# Patient Record
Sex: Female | Born: 1960 | Race: White | Hispanic: No | Smoking: Current every day smoker
Health system: Southern US, Community
[De-identification: ages and names within clinical notes are randomized; demographics above are authoritative.]

## PROBLEM LIST (undated history)

## (undated) DIAGNOSIS — J45901 Unspecified asthma with (acute) exacerbation: Secondary | ICD-10-CM

## (undated) DIAGNOSIS — M489 Spondylopathy, unspecified: Secondary | ICD-10-CM

## (undated) DIAGNOSIS — R7303 Prediabetes: Secondary | ICD-10-CM

## (undated) DIAGNOSIS — K7460 Unspecified cirrhosis of liver: Secondary | ICD-10-CM

## (undated) DIAGNOSIS — G8929 Other chronic pain: Secondary | ICD-10-CM

## (undated) DIAGNOSIS — E119 Type 2 diabetes mellitus without complications: Secondary | ICD-10-CM

## (undated) DIAGNOSIS — G4730 Sleep apnea, unspecified: Secondary | ICD-10-CM

## (undated) DIAGNOSIS — I639 Cerebral infarction, unspecified: Secondary | ICD-10-CM

## (undated) DIAGNOSIS — R011 Cardiac murmur, unspecified: Secondary | ICD-10-CM

## (undated) DIAGNOSIS — F410 Panic disorder [episodic paroxysmal anxiety] without agoraphobia: Secondary | ICD-10-CM

## (undated) DIAGNOSIS — Z87442 Personal history of urinary calculi: Secondary | ICD-10-CM

## (undated) DIAGNOSIS — Q2112 Patent foramen ovale: Secondary | ICD-10-CM

## (undated) DIAGNOSIS — D649 Anemia, unspecified: Secondary | ICD-10-CM

## (undated) DIAGNOSIS — J449 Chronic obstructive pulmonary disease, unspecified: Secondary | ICD-10-CM

## (undated) DIAGNOSIS — I499 Cardiac arrhythmia, unspecified: Secondary | ICD-10-CM

## (undated) DIAGNOSIS — H269 Unspecified cataract: Secondary | ICD-10-CM

## (undated) DIAGNOSIS — T7840XA Allergy, unspecified, initial encounter: Secondary | ICD-10-CM

## (undated) DIAGNOSIS — E559 Vitamin D deficiency, unspecified: Secondary | ICD-10-CM

## (undated) DIAGNOSIS — I10 Essential (primary) hypertension: Secondary | ICD-10-CM

## (undated) DIAGNOSIS — M1990 Unspecified osteoarthritis, unspecified site: Secondary | ICD-10-CM

## (undated) DIAGNOSIS — K219 Gastro-esophageal reflux disease without esophagitis: Secondary | ICD-10-CM

## (undated) DIAGNOSIS — Q211 Atrial septal defect: Secondary | ICD-10-CM

## (undated) DIAGNOSIS — Z5189 Encounter for other specified aftercare: Secondary | ICD-10-CM

## (undated) DIAGNOSIS — E7849 Other hyperlipidemia: Secondary | ICD-10-CM

## (undated) HISTORY — DX: Unspecified cirrhosis of liver: K74.60

## (undated) HISTORY — PX: CHOLECYSTECTOMY: SHX55

## (undated) HISTORY — PX: HERNIA REPAIR: SHX51

## (undated) HISTORY — DX: Unspecified asthma with (acute) exacerbation: J45.901

## (undated) HISTORY — DX: Other hyperlipidemia: E78.49

## (undated) HISTORY — DX: Encounter for other specified aftercare: Z51.89

## (undated) HISTORY — DX: Chronic obstructive pulmonary disease, unspecified: J44.9

## (undated) HISTORY — DX: Cardiac murmur, unspecified: R01.1

## (undated) HISTORY — DX: Other chronic pain: G89.29

## (undated) HISTORY — PX: SHOULDER ARTHROSCOPY: SHX128

## (undated) HISTORY — DX: Atrial septal defect: Q21.1

## (undated) HISTORY — DX: Vitamin D deficiency, unspecified: E55.9

## (undated) HISTORY — PX: TONSILLECTOMY AND ADENOIDECTOMY: SHX28

## (undated) HISTORY — DX: Morbid (severe) obesity due to excess calories: E66.01

## (undated) HISTORY — DX: Prediabetes: R73.03

## (undated) HISTORY — DX: Essential (primary) hypertension: I10

## (undated) HISTORY — DX: Unspecified cataract: H26.9

## (undated) HISTORY — DX: Allergy, unspecified, initial encounter: T78.40XA

## (undated) HISTORY — DX: Panic disorder (episodic paroxysmal anxiety): F41.0

## (undated) HISTORY — DX: Anemia, unspecified: D64.9

## (undated) HISTORY — DX: Spondylopathy, unspecified: M48.9

## (undated) HISTORY — PX: TUBAL LIGATION: SHX77

## (undated) HISTORY — DX: Unspecified osteoarthritis, unspecified site: M19.90

## (undated) HISTORY — DX: Patent foramen ovale: Q21.12

## (undated) NOTE — *Deleted (*Deleted)
Advice for Weight Management  -For most of us the best way to lose weight is by diet management. Generally speaking, diet management means consuming less calories intentionally which over time brings about progressive weight loss.  This can be achieved more effectively by restricting carbohydrate consumption to the minimum possible.  So, it is critically important to know your numbers: how much calorie you are consuming and how much calorie you need. More importantly, our carbohydrates sources should be unprocessed or minimally processed complex starch food items.   Sometimes, it is important to balance nutrition by increasing protein intake (animal or plant source), fruits, and vegetables.  -Sticking to a routine mealtime to eat 3 meals a day and avoiding unnecessary snacks is shown to have a big role in weight control. Under normal circumstances, the only time we lose real weight is when we are hungry, so allow hunger to take place- hunger means no food between meal times, only water.  It is not advisable to starve.   -It is better to avoid simple carbohydrates including: Cakes, Sweet Desserts, Ice Cream, Soda (diet and regular), Sweet Tea, Candies, Chips, Cookies, Store Bought Juices, Alcohol in Excess of  1-2 drinks a day, Artificial Sweeteners, Doughnuts, Coffee Creamers, "Sugar-free" Products, etc, etc.  This is not a complete list.....    -Consulting with certified diabetes educators is proven to provide you with the most accurate and current information on diet.  Also, you may be  interested in discussing diet options/exchanges , we can schedule a visit with Penny Crumpton, RDN, CDE for individualized nutrition education.  -Exercise: If you are able: 30 -60 minutes a day ,4 days a week, or 150 minutes a week.  The longer the better.  Combine stretch, strength, and aerobic activities.  If you were told in the past that you have high risk for cardiovascular diseases, you may seek evaluation by  your heart doctor prior to initiating moderate to intense exercise programs.    

---

## 2016-04-01 HISTORY — PX: TEE WITHOUT CARDIOVERSION: SHX5443

## 2016-04-16 DIAGNOSIS — E119 Type 2 diabetes mellitus without complications: Secondary | ICD-10-CM | POA: Diagnosis not present

## 2016-08-23 ENCOUNTER — Other Ambulatory Visit (HOSPITAL_COMMUNITY): Payer: Self-pay | Admitting: Internal Medicine

## 2016-08-23 DIAGNOSIS — Z1231 Encounter for screening mammogram for malignant neoplasm of breast: Secondary | ICD-10-CM

## 2016-08-23 DIAGNOSIS — E119 Type 2 diabetes mellitus without complications: Secondary | ICD-10-CM | POA: Diagnosis not present

## 2016-08-23 DIAGNOSIS — J45909 Unspecified asthma, uncomplicated: Secondary | ICD-10-CM | POA: Diagnosis not present

## 2016-08-23 DIAGNOSIS — F17210 Nicotine dependence, cigarettes, uncomplicated: Secondary | ICD-10-CM | POA: Diagnosis not present

## 2016-08-23 DIAGNOSIS — I10 Essential (primary) hypertension: Secondary | ICD-10-CM | POA: Diagnosis not present

## 2016-09-04 ENCOUNTER — Ambulatory Visit (HOSPITAL_COMMUNITY): Payer: Self-pay

## 2016-09-06 ENCOUNTER — Encounter (HOSPITAL_COMMUNITY): Payer: Self-pay | Admitting: Radiology

## 2016-09-06 ENCOUNTER — Ambulatory Visit (HOSPITAL_COMMUNITY)
Admission: RE | Admit: 2016-09-06 | Discharge: 2016-09-06 | Disposition: A | Discharge status: Home / Self Care | Payer: Medicare Other | Source: Ambulatory Visit | Attending: Internal Medicine | Admitting: Internal Medicine

## 2016-09-06 DIAGNOSIS — Z1231 Encounter for screening mammogram for malignant neoplasm of breast: Secondary | ICD-10-CM

## 2016-09-10 DIAGNOSIS — F410 Panic disorder [episodic paroxysmal anxiety] without agoraphobia: Secondary | ICD-10-CM | POA: Diagnosis not present

## 2016-09-10 DIAGNOSIS — E119 Type 2 diabetes mellitus without complications: Secondary | ICD-10-CM | POA: Diagnosis not present

## 2016-09-10 DIAGNOSIS — R010 Benign and innocent cardiac murmurs: Secondary | ICD-10-CM | POA: Diagnosis not present

## 2016-09-10 DIAGNOSIS — G8929 Other chronic pain: Secondary | ICD-10-CM | POA: Diagnosis not present

## 2016-09-11 DIAGNOSIS — E559 Vitamin D deficiency, unspecified: Secondary | ICD-10-CM | POA: Diagnosis not present

## 2016-09-11 DIAGNOSIS — I10 Essential (primary) hypertension: Secondary | ICD-10-CM | POA: Diagnosis not present

## 2016-09-11 DIAGNOSIS — E784 Other hyperlipidemia: Secondary | ICD-10-CM | POA: Diagnosis not present
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
## 2016-09-25 ENCOUNTER — Encounter: Payer: Self-pay | Admitting: *Deleted

## 2016-09-26 ENCOUNTER — Encounter: Payer: Self-pay | Admitting: Cardiovascular Disease

## 2016-09-26 ENCOUNTER — Ambulatory Visit (INDEPENDENT_AMBULATORY_CARE_PROVIDER_SITE_OTHER): Payer: Medicare Other | Admitting: Cardiovascular Disease

## 2016-09-26 VITALS — BP 144/90 | HR 86 | Ht 64.0 in | Wt 230.0 lb

## 2016-09-26 DIAGNOSIS — R011 Cardiac murmur, unspecified: Secondary | ICD-10-CM | POA: Diagnosis not present

## 2016-09-26 NOTE — Patient Instructions (Addendum)
Your physician recommends that you schedule a follow-up appointment As Needed  Your physician recommends that you continue on your current medications as directed. Please refer to the Current Medication list given to you today.  Please have ECHO Bubble Study  If you need a refill on your cardiac medications before your next appointment, please call your pharmacy.  Thank you for choosing [facility]!

## 2016-10-04 ENCOUNTER — Ambulatory Visit (HOSPITAL_COMMUNITY)
Admission: RE | Admit: 2016-10-04 | Discharge: 2016-10-04 | Disposition: A | Discharge status: Home / Self Care | Payer: Medicare Other | Source: Ambulatory Visit | Attending: Cardiovascular Disease | Admitting: Cardiovascular Disease

## 2016-10-04 DIAGNOSIS — I420 Dilated cardiomyopathy: Secondary | ICD-10-CM | POA: Insufficient documentation

## 2016-10-04 DIAGNOSIS — I080 Rheumatic disorders of both mitral and aortic valves: Secondary | ICD-10-CM | POA: Insufficient documentation

## 2016-10-04 DIAGNOSIS — R011 Cardiac murmur, unspecified: Secondary | ICD-10-CM

## 2016-10-04 DIAGNOSIS — Q211 Atrial septal defect: Secondary | ICD-10-CM | POA: Diagnosis not present

## 2016-10-04 LAB — ECHOCARDIOGRAM COMPLETE BUBBLE STUDY
CHL CUP DOP CALC LVOT VTI: 24.9 cm
CHL CUP MV DEC (S): 218
CHL CUP STROKE VOLUME: 49 mL
EERAT: 12.9
EWDT: 218 ms
FS: 35 % (ref 28–44)
IVS/LV PW RATIO, ED: 1.07
LA ID, A-P, ES: 33 mm
LA vol A4C: 33 ml
LADIAMINDEX: 1.48 cm/m2
LAVOL: 41 mL
LAVOLIN: 18.4 mL/m2
LEFT ATRIUM END SYS DIAM: 33 mm
LV E/e' medial: 12.9
LV SIMPSON'S DISK: 63
LV TDI E'MEDIAL: 9.36
LV dias vol index: 35 mL/m2
LV dias vol: 78 mL (ref 46–106)
LV sys vol index: 13 mL/m2
LV sys vol: 29 mL (ref 14–42)
LVEEAVG: 12.9
LVELAT: 6.96 cm/s
LVOT area: 2.27 cm2
LVOT diameter: 17 mm
LVOT peak grad rest: 4 mmHg
LVOTPV: 103 cm/s
LVOTSV: 57 mL
Lateral S' vel: 12.6 cm/s
MV pk A vel: 90.6 m/s
MVPG: 3 mmHg
MVPKEVEL: 89.8 m/s
PW: 9.31 mm — AB (ref 0.6–1.1)
RV TAPSE: 23.2 mm
TDI e' lateral: 6.96

## 2016-10-04 NOTE — Progress Notes (Signed)
*  PRELIMINARY RESULTS* Echocardiogram 2D Echocardiogram has been performed with Saline Bubbly Study.  Samuel Germany 10/04/2016, 10:44 AM

## 2016-11-01 DIAGNOSIS — G894 Chronic pain syndrome: Secondary | ICD-10-CM | POA: Diagnosis not present

## 2016-11-01 DIAGNOSIS — M25539 Pain in unspecified wrist: Secondary | ICD-10-CM | POA: Diagnosis not present

## 2016-11-01 DIAGNOSIS — M25519 Pain in unspecified shoulder: Secondary | ICD-10-CM | POA: Diagnosis not present

## 2016-11-01 DIAGNOSIS — M79659 Pain in unspecified thigh: Secondary | ICD-10-CM | POA: Diagnosis not present

## 2016-11-01 DIAGNOSIS — M545 Low back pain: Secondary | ICD-10-CM | POA: Diagnosis not present

## 2016-11-01 DIAGNOSIS — M25511 Pain in right shoulder: Secondary | ICD-10-CM | POA: Diagnosis not present

## 2016-11-01 DIAGNOSIS — M25569 Pain in unspecified knee: Secondary | ICD-10-CM | POA: Diagnosis not present

## 2016-11-01 DIAGNOSIS — M542 Cervicalgia: Secondary | ICD-10-CM | POA: Diagnosis not present

## 2016-11-29 DIAGNOSIS — M542 Cervicalgia: Secondary | ICD-10-CM | POA: Diagnosis not present

## 2016-11-29 DIAGNOSIS — G894 Chronic pain syndrome: Secondary | ICD-10-CM | POA: Diagnosis not present

## 2016-11-29 DIAGNOSIS — M545 Low back pain: Secondary | ICD-10-CM | POA: Diagnosis not present

## 2016-12-20 DIAGNOSIS — R011 Cardiac murmur, unspecified: Secondary | ICD-10-CM | POA: Diagnosis not present

## 2016-12-20 DIAGNOSIS — I10 Essential (primary) hypertension: Secondary | ICD-10-CM | POA: Diagnosis not present

## 2016-12-20 DIAGNOSIS — E119 Type 2 diabetes mellitus without complications: Secondary | ICD-10-CM | POA: Diagnosis not present

## 2017-01-14 DIAGNOSIS — M545 Low back pain: Secondary | ICD-10-CM | POA: Diagnosis not present

## 2017-01-14 DIAGNOSIS — G894 Chronic pain syndrome: Secondary | ICD-10-CM | POA: Diagnosis not present

## 2017-01-14 DIAGNOSIS — Z79891 Long term (current) use of opiate analgesic: Secondary | ICD-10-CM | POA: Diagnosis not present

## 2017-01-14 DIAGNOSIS — M542 Cervicalgia: Secondary | ICD-10-CM | POA: Diagnosis not present

## 2017-04-09 DIAGNOSIS — R010 Benign and innocent cardiac murmurs: Secondary | ICD-10-CM | POA: Diagnosis not present

## 2017-04-09 DIAGNOSIS — E119 Type 2 diabetes mellitus without complications: Secondary | ICD-10-CM | POA: Diagnosis not present

## 2017-04-09 DIAGNOSIS — F410 Panic disorder [episodic paroxysmal anxiety] without agoraphobia: Secondary | ICD-10-CM | POA: Diagnosis not present

## 2017-04-09 DIAGNOSIS — R7303 Prediabetes: Secondary | ICD-10-CM | POA: Diagnosis not present

## 2017-04-09 DIAGNOSIS — I10 Essential (primary) hypertension: Secondary | ICD-10-CM | POA: Diagnosis not present

## 2017-04-09 DIAGNOSIS — G8929 Other chronic pain: Secondary | ICD-10-CM | POA: Diagnosis not present

## 2017-06-02 ENCOUNTER — Emergency Department (HOSPITAL_COMMUNITY)
Admission: EM | Admit: 2017-06-02 | Discharge: 2017-06-02 | Disposition: A | Discharge status: Home / Self Care | Payer: Medicare Other | Attending: Emergency Medicine | Admitting: Emergency Medicine

## 2017-06-02 ENCOUNTER — Other Ambulatory Visit: Payer: Self-pay

## 2017-06-02 ENCOUNTER — Encounter (HOSPITAL_COMMUNITY): Payer: Self-pay | Admitting: Emergency Medicine

## 2017-06-02 ENCOUNTER — Emergency Department (HOSPITAL_COMMUNITY): Payer: Medicare Other

## 2017-06-02 DIAGNOSIS — R202 Paresthesia of skin: Secondary | ICD-10-CM | POA: Insufficient documentation

## 2017-06-02 DIAGNOSIS — I10 Essential (primary) hypertension: Secondary | ICD-10-CM | POA: Insufficient documentation

## 2017-06-02 DIAGNOSIS — R531 Weakness: Secondary | ICD-10-CM | POA: Insufficient documentation

## 2017-06-02 DIAGNOSIS — Z79899 Other long term (current) drug therapy: Secondary | ICD-10-CM | POA: Insufficient documentation

## 2017-06-02 DIAGNOSIS — F17210 Nicotine dependence, cigarettes, uncomplicated: Secondary | ICD-10-CM | POA: Diagnosis not present

## 2017-06-02 DIAGNOSIS — R200 Anesthesia of skin: Secondary | ICD-10-CM | POA: Diagnosis not present

## 2017-06-02 DIAGNOSIS — J45909 Unspecified asthma, uncomplicated: Secondary | ICD-10-CM | POA: Insufficient documentation

## 2017-06-02 LAB — CBC WITH DIFFERENTIAL/PLATELET
BASOS ABS: 0 10*3/uL (ref 0.0–0.1)
BASOS PCT: 0 %
EOS ABS: 0.3 10*3/uL (ref 0.0–0.7)
Eosinophils Relative: 4 %
HEMATOCRIT: 48.9 % — AB (ref 36.0–46.0)
HEMOGLOBIN: 15.6 g/dL — AB (ref 12.0–15.0)
Lymphocytes Relative: 31 %
Lymphs Abs: 2.2 10*3/uL (ref 0.7–4.0)
MCH: 29.1 pg (ref 26.0–34.0)
MCHC: 31.9 g/dL (ref 30.0–36.0)
MCV: 91.2 fL (ref 78.0–100.0)
Monocytes Absolute: 0.7 10*3/uL (ref 0.1–1.0)
Monocytes Relative: 9 %
NEUTROS ABS: 4 10*3/uL (ref 1.7–7.7)
NEUTROS PCT: 56 %
Platelets: 217 10*3/uL (ref 150–400)
RBC: 5.36 MIL/uL — ABNORMAL HIGH (ref 3.87–5.11)
RDW: 14.2 % (ref 11.5–15.5)
WBC: 7.1 10*3/uL (ref 4.0–10.5)

## 2017-06-02 LAB — COMPREHENSIVE METABOLIC PANEL
ALBUMIN: 3.9 g/dL (ref 3.5–5.0)
ALK PHOS: 83 U/L (ref 38–126)
ALT: 20 U/L (ref 14–54)
AST: 20 U/L (ref 15–41)
Anion gap: 11 (ref 5–15)
BILIRUBIN TOTAL: 0.4 mg/dL (ref 0.3–1.2)
BUN: 12 mg/dL (ref 6–20)
CALCIUM: 9.1 mg/dL (ref 8.9–10.3)
CO2: 23 mmol/L (ref 22–32)
Chloride: 103 mmol/L (ref 101–111)
Creatinine, Ser: 0.75 mg/dL (ref 0.44–1.00)
GFR calc Af Amer: >60 mL/min (ref >60)
GFR calc non Af Amer: >60 mL/min (ref >60)
GLUCOSE: 118 mg/dL — AB (ref 65–99)
POTASSIUM: 3.5 mmol/L (ref 3.5–5.1)
SODIUM: 137 mmol/L (ref 135–145)
TOTAL PROTEIN: 7 g/dL (ref 6.5–8.1)

## 2017-06-02 MED ORDER — CLOPIDOGREL BISULFATE 75 MG PO TABS: 75.0000 mg | ORAL_TABLET | Freq: Every day | ORAL | 0 refills | Status: DC

## 2017-06-02 MED ORDER — CLOPIDOGREL BISULFATE 75 MG PO TABS
75.0000 mg | ORAL_TABLET | Freq: Once | ORAL | Status: AC
  Administered 2017-06-02: 75 mg via ORAL
  Filled 2017-06-02: qty 1

## 2017-06-02 NOTE — ED Provider Notes (Signed)
[facility] EMERGENCY DEPARTMENT Provider Note   CSN: [id number] Arrival date & time: 06/02/17  1243     History   Chief Complaint Chief Complaint  Patient presents with  . Numbness    HPI Wendy Kramer is a 57 y.o. female.  Patient states for a week now she has been having periodic numbness in her right hand and right leg and in her right upper lip.  She states when she turns her head certain ways it helps it in all 3 areas.  Patient states this is been going on for at least 7 days   The history is provided by the patient. No language interpreter was used.  Weakness  Primary symptoms comment: Numbness. This is a new problem. The current episode started more than 1 week ago. The problem has not changed since onset.There was no focality noted. There has been no fever. Pertinent negatives include no chest pain and no headaches.    Past Medical History:  Diagnosis Date  . Cardiac murmur   . Exogenous hyperlipidemia   . HTN (hypertension)   . Morbid obesity due to excess calories ([place])   . Other chronic pain   . Panic disorder   . Prediabetes   . Unspecified asthma with (acute) exacerbation   . Vitamin D deficiency     There are no active problems to display for this patient.   Past Surgical History:  Procedure Laterality Date  . HERNIA REPAIR    . SHOULDER ARTHROSCOPY    . TONSILLECTOMY AND ADENOIDECTOMY      OB History    No data available       Home Medications    Prior to Admission medications   Medication Sig Start Date End Date Taking? Authorizing Provider  albuterol (PROVENTIL HFA;VENTOLIN HFA) 108 (90 Base) MCG/ACT inhaler Inhale 1-2 puffs into the lungs every 6 (six) hours as needed for wheezing or shortness of breath.   Yes [provider]  ALPRAZolam (XANAX) 0.25 MG tablet Take 0.125-0.25 mg by mouth daily as needed for anxiety.   Yes [provider]  budesonide-formoterol (SYMBICORT) 80-4.5 MCG/ACT inhaler Inhale 2 puffs into the  lungs 2 (two) times daily.   Yes [provider]  Cholecalciferol (VITAMIN D3) 1000 units CAPS Take 2,000 Units by mouth daily.   Yes [provider]  fluticasone (FLONASE) 50 MCG/ACT nasal spray Place 1-2 sprays into both nostrils daily as needed for allergies or rhinitis.   Yes [provider]  ibuprofen (ADVIL,MOTRIN) 200 MG tablet Take 800 mg by mouth every 6 (six) hours as needed for moderate pain.   Yes [provider]  lisinopril (PRINIVIL,ZESTRIL) 20 MG tablet Take 10 mg by mouth at bedtime.    Yes [provider]  ranitidine (ZANTAC) 150 MG tablet TK 1 T PO BID 09/20/16  Yes [provider]  traMADol (ULTRAM) 50 MG tablet Take 50 mg by mouth daily as needed for moderate pain.  09/20/16  Yes [provider]  atorvastatin (LIPITOR) 20 MG tablet TK 1 T PO QD 09/12/16   [provider]  clopidogrel (PLAVIX) 75 MG tablet Take 1 tablet (75 mg total) by mouth daily. 06/02/17   Milton Ferguson, MD    Family History Family History  Problem Relation Age of Onset  . Breast cancer Maternal Aunt     Social History Social History   Tobacco Use  . Smoking status: Current Every Day Smoker    Packs/day: 0.50    Types:  Cigarettes  . Smokeless tobacco: Never Used  Substance Use Topics  . Alcohol use: No  . Drug use: No     Allergies   Asa [aspirin]; Augmentin [amoxicillin-pot clavulanate]; Levaquin [levofloxacin in d5w]; and Lovenox [enoxaparin sodium]   Review of Systems Review of Systems  Constitutional: Negative for appetite change and fatigue.  HENT: Negative for congestion, ear discharge and sinus pressure.   Eyes: Negative for discharge.  Respiratory: Negative for cough.   Cardiovascular: Negative for chest pain.  Gastrointestinal: Negative for abdominal pain and diarrhea.  Genitourinary: Negative for frequency and hematuria.  Musculoskeletal: Negative for back pain.  Skin: Negative for rash.  Neurological:  Negative for seizures and headaches.       Numbness in face right hand and right foot  Psychiatric/Behavioral: Negative for hallucinations.     Physical Exam Updated Vital Signs BP 136/64 (BP Location: Left Arm)   Pulse 62   Temp (!) 97.4 F (36.3 C) (Oral)   Resp 18   Ht 5\' 4"  (1.626 m)   Wt 103.4 kg (228 lb)   LMP 09/06/2016   SpO2 96%   BMI 39.14 kg/m   Physical Exam  Constitutional: She is oriented to person, place, and time. She appears well-developed.  HENT:  Head: Normocephalic.  Eyes: Conjunctivae and EOM are normal. No scleral icterus.  Neck: Neck supple. No thyromegaly present.  Cardiovascular: Normal rate and regular rhythm. Exam reveals no gallop and no friction rub.  No murmur heard. Pulmonary/Chest: No stridor. She has no wheezes. She has no rales. She exhibits no tenderness.  Abdominal: She exhibits no distension. There is no tenderness. There is no rebound.  Musculoskeletal: Normal range of motion. She exhibits no edema.  Mild decreased sensation in right hand and right leg  Lymphadenopathy:    She has no cervical adenopathy.  Neurological: She is oriented to person, place, and time. She exhibits normal muscle tone. Coordination normal.  Skin: No rash noted. No erythema.  Psychiatric: She has a normal mood and affect. Her behavior is normal.     ED Treatments / Results  Labs (all labs ordered are listed, but only abnormal results are displayed) Labs Reviewed  CBC WITH DIFFERENTIAL/PLATELET - Abnormal; Notable for the following components:      Result Value   RBC 5.36 (*)    Hemoglobin 15.6 (*)    HCT 48.9 (*)    All other components within normal limits  COMPREHENSIVE METABOLIC PANEL - Abnormal; Notable for the following components:   Glucose, Bld 118 (*)    All other components within normal limits    EKG  EKG Interpretation None       Radiology Ct Head Wo Contrast  Result Date: 06/02/2017 CLINICAL DATA:  Right-sided limp, arm and leg  numbness times 10 days. Neck injury from fall 10 years ago. EXAM: CT HEAD WITHOUT CONTRAST CT CERVICAL SPINE WITHOUT CONTRAST TECHNIQUE: Multidetector CT imaging of the head and cervical spine was performed following the standard protocol without intravenous contrast. Multiplanar CT image reconstructions of the cervical spine were also generated. COMPARISON:  None. FINDINGS: CT HEAD FINDINGS Brain: Hypodensities in the left basal ganglia compatible with chronic lacunar infarcts. Chronic minimal small vessel ischemic disease of periventricular white matter. Large vascular territory infarct, hemorrhage or midline shift. No intra-axial mass nor extra-axial fluid collections. No hydrocephalus. Midline ventricle and basal cisterns. Vascular: No hyperdense vessels. Moderate atherosclerosis of the carotid siphons. Skull: No acute abnormality. Sinuses/Orbits: No acute abnormality. Other: None. CT CERVICAL  SPINE FINDINGS Alignment: Reversal cervical lordosis possibly from muscle spasm or positioning. Skull base and vertebrae: Intact skull base. Osteoarthritis of the atlantodental interval with joint space narrowing, sclerosis and spurring. No vertebral body fracture. No jumped or perched facets. Soft tissues and spinal canal: No prevertebral fluid or swelling. No visible canal hematoma. Disc levels: Small posterior marginal osteophytes at C5-6 and C6-7 with small central partially calcified disc suggested at C6-7. Uncovertebral joint osteoarthritis on the right at C6-7 with uncinate spurring. Upper chest: Negative. Other: Moderate extracranial carotid arteriosclerosis. IMPRESSION: 1. 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 No acute intracranial abnormality. 2. Mild degenerative change of lower cervical spine at C5-6 and C6-7. No acute osseous abnormality Electronically Signed   By: Ashley Royalty M.D.   On: 06/02/2017 18:10   Ct Cervical  Spine Wo Contrast  Result Date: 06/02/2017 CLINICAL DATA:  Right-sided limp, arm and leg numbness times 10 days. Neck injury from fall 10 years ago. EXAM: CT HEAD WITHOUT CONTRAST CT CERVICAL SPINE WITHOUT CONTRAST TECHNIQUE: Multidetector CT imaging of the head and cervical spine was performed following the standard protocol without intravenous contrast. Multiplanar CT image reconstructions of the cervical spine were also generated. COMPARISON:  None. FINDINGS: CT HEAD FINDINGS Brain: Hypodensities in the left basal ganglia compatible with chronic lacunar infarcts. Chronic minimal small vessel ischemic disease of periventricular white matter. Large vascular territory infarct, hemorrhage or midline shift. No intra-axial mass nor extra-axial fluid collections. No hydrocephalus. Midline ventricle and basal cisterns. Vascular: No hyperdense vessels. Moderate atherosclerosis of the carotid siphons. Skull: No acute abnormality. Sinuses/Orbits: No acute abnormality. Other: None. CT CERVICAL SPINE FINDINGS Alignment: Reversal cervical lordosis possibly from muscle spasm or positioning. Skull base and vertebrae: Intact skull base. Osteoarthritis of the atlantodental interval with joint space narrowing, sclerosis and spurring. No vertebral body fracture. No jumped or perched facets. Soft tissues and spinal canal: No prevertebral fluid or swelling. No visible canal hematoma. Disc levels: Small posterior marginal osteophytes at C5-6 and C6-7 with small central partially calcified disc suggested at C6-7. Uncovertebral joint osteoarthritis on the right at C6-7 with uncinate spurring. Upper chest: Negative. Other: Moderate extracranial carotid arteriosclerosis. IMPRESSION: 1. 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 No acute intracranial abnormality. 2. Mild degenerative change of lower cervical spine at C5-6 and C6-7. No acute osseous  abnormality Electronically Signed   By: Ashley Royalty M.D.   On: 06/02/2017 18:10    Procedures Procedures (including critical care time)  Medications Ordered in ED Medications  clopidogrel (PLAVIX) tablet 75 mg (not administered)     Initial Impression / Assessment and Plan / ED Course  I have reviewed the triage vital signs and the nursing notes.  Pertinent labs & imaging results that were available during my care of the patient were reviewed by me and considered in my medical decision making (see chart for details). Patient CT of head that shows old lacunar infarcts.  Patient has been on Plavix before but this was stopped for an unknown reason.  She is allergic to aspirin.  Suspect a lot of her symptoms are related to her cervical disease.  She has an appointment to see neurology in 4 days.  We will place her back on her Plavix unless neurologist decide whether treatment is necessary      Final Clinical Impressions(s) / ED Diagnoses   Final diagnoses:  Paresthesia    ED  Discharge Orders        Ordered    clopidogrel (PLAVIX) 75 MG tablet  Daily     06/02/17 1950       Milton Ferguson, MD 06/02/17 1955

## 2017-06-02 NOTE — Discharge Instructions (Signed)
When you see the neurologist on Friday talk to him about the numbness in your hand leg and face and the beginning of Plavix recently.
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
## 2017-06-06 DIAGNOSIS — M542 Cervicalgia: Secondary | ICD-10-CM | POA: Diagnosis not present

## 2017-06-06 DIAGNOSIS — M79601 Pain in right arm: Secondary | ICD-10-CM | POA: Diagnosis not present

## 2017-06-06 DIAGNOSIS — M545 Low back pain: Secondary | ICD-10-CM | POA: Diagnosis not present

## 2017-06-06 DIAGNOSIS — I679 Cerebrovascular disease, unspecified: Secondary | ICD-10-CM | POA: Diagnosis not present

## 2017-06-06 DIAGNOSIS — Z79891 Long term (current) use of opiate analgesic: Secondary | ICD-10-CM | POA: Diagnosis not present

## 2017-06-06 DIAGNOSIS — I6990 Unspecified sequelae of unspecified cerebrovascular disease: Secondary | ICD-10-CM | POA: Diagnosis not present

## 2017-06-06 DIAGNOSIS — I10 Essential (primary) hypertension: Secondary | ICD-10-CM | POA: Diagnosis not present

## 2017-07-11 DIAGNOSIS — I10 Essential (primary) hypertension: Secondary | ICD-10-CM | POA: Diagnosis not present

## 2017-07-11 DIAGNOSIS — I6990 Unspecified sequelae of unspecified cerebrovascular disease: Secondary | ICD-10-CM | POA: Diagnosis not present

## 2017-07-11 DIAGNOSIS — M542 Cervicalgia: Secondary | ICD-10-CM | POA: Diagnosis not present

## 2017-07-11 DIAGNOSIS — G894 Chronic pain syndrome: Secondary | ICD-10-CM | POA: Diagnosis not present

## 2017-07-11 DIAGNOSIS — Z79899 Other long term (current) drug therapy: Secondary | ICD-10-CM | POA: Diagnosis not present

## 2017-07-11 DIAGNOSIS — M79601 Pain in right arm: Secondary | ICD-10-CM | POA: Diagnosis not present

## 2017-07-11 DIAGNOSIS — I679 Cerebrovascular disease, unspecified: Secondary | ICD-10-CM | POA: Diagnosis not present

## 2017-07-11 DIAGNOSIS — Z79891 Long term (current) use of opiate analgesic: Secondary | ICD-10-CM | POA: Diagnosis not present

## 2017-07-14 DIAGNOSIS — Z1331 Encounter for screening for depression: Secondary | ICD-10-CM | POA: Diagnosis not present

## 2017-07-14 DIAGNOSIS — Z1389 Encounter for screening for other disorder: Secondary | ICD-10-CM | POA: Diagnosis not present

## 2017-07-14 DIAGNOSIS — E119 Type 2 diabetes mellitus without complications: Secondary | ICD-10-CM | POA: Diagnosis not present

## 2017-07-14 DIAGNOSIS — F17210 Nicotine dependence, cigarettes, uncomplicated: Secondary | ICD-10-CM | POA: Diagnosis not present

## 2017-07-14 DIAGNOSIS — F17200 Nicotine dependence, unspecified, uncomplicated: Secondary | ICD-10-CM | POA: Diagnosis not present

## 2017-07-14 DIAGNOSIS — I10 Essential (primary) hypertension: Secondary | ICD-10-CM | POA: Diagnosis not present

## 2017-07-14 DIAGNOSIS — J45909 Unspecified asthma, uncomplicated: Secondary | ICD-10-CM | POA: Diagnosis not present

## 2017-08-18 DIAGNOSIS — I10 Essential (primary) hypertension: Secondary | ICD-10-CM | POA: Diagnosis not present

## 2017-08-18 DIAGNOSIS — I679 Cerebrovascular disease, unspecified: Secondary | ICD-10-CM | POA: Diagnosis not present

## 2017-08-18 DIAGNOSIS — I6990 Unspecified sequelae of unspecified cerebrovascular disease: Secondary | ICD-10-CM | POA: Diagnosis not present

## 2017-08-18 DIAGNOSIS — M79601 Pain in right arm: Secondary | ICD-10-CM | POA: Diagnosis not present

## 2017-09-05 DIAGNOSIS — Z1211 Encounter for screening for malignant neoplasm of colon: Secondary | ICD-10-CM | POA: Diagnosis not present

## 2017-09-05 DIAGNOSIS — Z1212 Encounter for screening for malignant neoplasm of rectum: Secondary | ICD-10-CM | POA: Diagnosis not present

## 2017-09-05 LAB — COLOGUARD

## 2017-09-23 ENCOUNTER — Encounter: Payer: Self-pay | Admitting: Internal Medicine

## 2017-10-21 ENCOUNTER — Emergency Department (HOSPITAL_COMMUNITY)
Admission: EM | Admit: 2017-10-21 | Discharge: 2017-10-21 | Disposition: A | Discharge status: Home / Self Care | Payer: Medicare Other | Attending: Emergency Medicine | Admitting: Emergency Medicine

## 2017-10-21 ENCOUNTER — Other Ambulatory Visit: Payer: Self-pay

## 2017-10-21 ENCOUNTER — Encounter (HOSPITAL_COMMUNITY): Payer: Self-pay | Admitting: Emergency Medicine

## 2017-10-21 ENCOUNTER — Emergency Department (HOSPITAL_COMMUNITY): Payer: Medicare Other

## 2017-10-21 DIAGNOSIS — F17210 Nicotine dependence, cigarettes, uncomplicated: Secondary | ICD-10-CM | POA: Insufficient documentation

## 2017-10-21 DIAGNOSIS — Y999 Unspecified external cause status: Secondary | ICD-10-CM | POA: Insufficient documentation

## 2017-10-21 DIAGNOSIS — S46911A Strain of unspecified muscle, fascia and tendon at shoulder and upper arm level, right arm, initial encounter: Secondary | ICD-10-CM | POA: Diagnosis not present

## 2017-10-21 DIAGNOSIS — I10 Essential (primary) hypertension: Secondary | ICD-10-CM | POA: Insufficient documentation

## 2017-10-21 DIAGNOSIS — Z79899 Other long term (current) drug therapy: Secondary | ICD-10-CM | POA: Insufficient documentation

## 2017-10-21 DIAGNOSIS — Y939 Activity, unspecified: Secondary | ICD-10-CM | POA: Diagnosis not present

## 2017-10-21 DIAGNOSIS — J45909 Unspecified asthma, uncomplicated: Secondary | ICD-10-CM | POA: Diagnosis not present

## 2017-10-21 DIAGNOSIS — S4991XA Unspecified injury of right shoulder and upper arm, initial encounter: Secondary | ICD-10-CM | POA: Diagnosis present

## 2017-10-21 DIAGNOSIS — X58XXXA Exposure to other specified factors, initial encounter: Secondary | ICD-10-CM | POA: Diagnosis not present

## 2017-10-21 DIAGNOSIS — M25511 Pain in right shoulder: Secondary | ICD-10-CM | POA: Diagnosis not present

## 2017-10-21 DIAGNOSIS — Y929 Unspecified place or not applicable: Secondary | ICD-10-CM | POA: Insufficient documentation

## 2017-10-21 DIAGNOSIS — R109 Unspecified abdominal pain: Secondary | ICD-10-CM | POA: Insufficient documentation

## 2017-10-21 LAB — CBC WITH DIFFERENTIAL/PLATELET
BASOS PCT: 0 %
Basophils Absolute: 0 10*3/uL (ref 0.0–0.1)
EOS PCT: 6 %
Eosinophils Absolute: 0.8 10*3/uL — ABNORMAL HIGH (ref 0.0–0.7)
HCT: 47 % — ABNORMAL HIGH (ref 36.0–46.0)
Hemoglobin: 15.7 g/dL — ABNORMAL HIGH (ref 12.0–15.0)
LYMPHS ABS: 3 10*3/uL (ref 0.7–4.0)
Lymphocytes Relative: 24 %
MCH: 30.5 pg (ref 26.0–34.0)
MCHC: 33.4 g/dL (ref 30.0–36.0)
MCV: 91.4 fL (ref 78.0–100.0)
MONO ABS: 1 10*3/uL (ref 0.1–1.0)
MONOS PCT: 8 %
NEUTROS PCT: 62 %
Neutro Abs: 7.8 10*3/uL — ABNORMAL HIGH (ref 1.7–7.7)
PLATELETS: 222 10*3/uL (ref 150–400)
RBC: 5.14 MIL/uL — ABNORMAL HIGH (ref 3.87–5.11)
RDW: 13.9 % (ref 11.5–15.5)
WBC: 12.6 10*3/uL — ABNORMAL HIGH (ref 4.0–10.5)

## 2017-10-21 LAB — URINALYSIS, ROUTINE W REFLEX MICROSCOPIC
BILIRUBIN URINE: NEGATIVE
Glucose, UA: NEGATIVE mg/dL
KETONES UR: NEGATIVE mg/dL
LEUKOCYTES UA: NEGATIVE
NITRITE: NEGATIVE
PH: 5 (ref 5.0–8.0)
Protein, ur: NEGATIVE mg/dL
Specific Gravity, Urine: 1.019 (ref 1.005–1.030)

## 2017-10-21 LAB — BASIC METABOLIC PANEL
Anion gap: 8 (ref 5–15)
BUN: 13 mg/dL (ref 6–20)
CALCIUM: 9 mg/dL (ref 8.9–10.3)
CHLORIDE: 104 mmol/L (ref 98–111)
CO2: 25 mmol/L (ref 22–32)
CREATININE: 0.91 mg/dL (ref 0.44–1.00)
GFR calc non Af Amer: >60 mL/min (ref >60)
Glucose, Bld: 150 mg/dL — ABNORMAL HIGH (ref 70–99)
Potassium: 4 mmol/L (ref 3.5–5.1)
Sodium: 137 mmol/L (ref 135–145)

## 2017-10-21 LAB — MAGNESIUM: MAGNESIUM: 2.1 mg/dL (ref 1.7–2.4)

## 2017-10-21 MED ORDER — CELECOXIB 200 MG PO CAPS: 200.0000 mg | ORAL_CAPSULE | Freq: Two times a day (BID) | ORAL | 0 refills | Status: DC

## 2017-10-21 MED ORDER — KETOROLAC TROMETHAMINE 30 MG/ML IJ SOLN
30.0000 mg | Freq: Once | INTRAMUSCULAR | Status: AC
  Administered 2017-10-21: 30 mg via INTRAMUSCULAR
  Filled 2017-10-21: qty 1

## 2017-10-21 NOTE — ED Notes (Signed)
Patient transported to X-ray 

## 2017-10-21 NOTE — ED Provider Notes (Signed)
[facility] EMERGENCY DEPARTMENT Provider Note   CSN: [id number] Arrival date & time: 10/21/17  2039     History   Chief Complaint Chief Complaint  Patient presents with  . Flank Pain    HPI Wendy Kramer is a 57 y.o. female.  Pt presents to the ED today with right shoulder pain and right flank pain.  The pt has had right shoulder pain for the last 5 days.  She has a hx of rotator cuff injury and repair.  This feels similar to initial injury.  No known trauma.  She then developed some muscle spasm in the back of her arm.  Next, she developed right flank pain for the past 2 days.  She denies urinary sx.  No rash.       Past Medical History:  Diagnosis Date  . Cardiac murmur   . Exogenous hyperlipidemia   . HTN (hypertension)   . Morbid obesity due to excess calories ([place])   . Other chronic pain   . Panic disorder   . Prediabetes   . Unspecified asthma with (acute) exacerbation   . Vitamin D deficiency     There are no active problems to display for this patient.   Past Surgical History:  Procedure Laterality Date  . CHOLECYSTECTOMY    . HERNIA REPAIR    . SHOULDER ARTHROSCOPY    . TONSILLECTOMY AND ADENOIDECTOMY       OB History   None      Home Medications    Prior to Admission medications   Medication Sig Start Date End Date Taking? Authorizing Provider  albuterol (PROVENTIL HFA;VENTOLIN HFA) 108 (90 Base) MCG/ACT inhaler Inhale 1-2 puffs into the lungs every 6 (six) hours as needed for wheezing or shortness of breath.   Yes [provider]  ALPRAZolam (XANAX) 0.25 MG tablet Take 0.125-0.25 mg by mouth daily as needed for anxiety.   Yes [provider]  budesonide-formoterol (SYMBICORT) 80-4.5 MCG/ACT inhaler Inhale 1 puff into the lungs 2 (two) times daily.    Yes [provider]  Cholecalciferol (VITAMIN D3) 1000 units CAPS Take 2,000 Units by mouth daily.   Yes [provider]  clopidogrel (PLAVIX) 75 MG tablet Take  1 tablet (75 mg total) by mouth daily. 06/02/17  Yes Milton Ferguson, MD  cyclobenzaprine (FLEXERIL) 5 MG tablet Take 5-10 mg by mouth 3 (three) times daily as needed. 08/18/17  Yes [provider]  fluticasone (FLONASE) 50 MCG/ACT nasal spray Place 1-2 sprays into both nostrils daily as needed for allergies or rhinitis.   Yes [provider]  lisinopril (PRINIVIL,ZESTRIL) 10 MG tablet Take 10 mg by mouth at bedtime.    Yes [provider]  ranitidine (ZANTAC) 150 MG tablet TAKE ONE TABLET BY MOUTH TWICE DAILY 09/20/16  Yes [provider]  traMADol (ULTRAM) 50 MG tablet Take 50 mg by mouth daily as needed for moderate pain.  09/20/16  Yes [provider]  celecoxib (CELEBREX) 200 MG capsule Take 1 capsule (200 mg total) by mouth 2 (two) times daily. 10/21/17   Isla Pence, MD    Family History Family History  Problem Relation Age of Onset  . Breast cancer Maternal Aunt     Social History Social History   Tobacco Use  . Smoking status: Current Every Day Smoker    Packs/day: 0.50    Types: Cigarettes  . Smokeless tobacco: Never Used  Substance Use Topics  . Alcohol use: No  . Drug use:  No     Allergies   Asa [aspirin]; Augmentin [amoxicillin-pot clavulanate]; Levaquin [levofloxacin in d5w]; and Lovenox [enoxaparin sodium]   Review of Systems Review of Systems  Gastrointestinal: Positive for abdominal pain.  Musculoskeletal:       Right shoulder pain  All other systems reviewed and are negative.    Physical Exam Updated Vital Signs BP (!) 154/64 (BP Location: Left Arm)   Pulse 94   Temp 98 F (36.7 C) (Oral)   Resp 16   Ht 5\' 4"  (1.626 m)   Wt 104.8 kg (231 lb)   LMP 09/06/2016   SpO2 97%   BMI 39.65 kg/m   Physical Exam  Constitutional: She is oriented to person, place, and time. She appears well-developed and well-nourished.  HENT:  Head: Normocephalic and atraumatic.  Right Ear: External ear normal.  Left Ear:  External ear normal.  Nose: Nose normal.  Mouth/Throat: Oropharynx is clear and moist.  Eyes: Pupils are equal, round, and reactive to light. Conjunctivae and EOM are normal.  Neck: Normal range of motion. Neck supple.  Cardiovascular: Normal rate, regular rhythm, normal heart sounds and intact distal pulses.  Pulmonary/Chest: Effort normal and breath sounds normal.  Abdominal: Soft. Bowel sounds are normal.  Musculoskeletal:       Right shoulder: She exhibits decreased range of motion and tenderness.  Neurological: She is alert and oriented to person, place, and time.  Skin: Skin is warm. Capillary refill takes less than 2 seconds. No rash noted.  Psychiatric: She has a normal mood and affect. Her behavior is normal. Judgment and thought content normal.  Nursing note and vitals reviewed.    ED Treatments / Results  Labs (all labs ordered are listed, but only abnormal results are displayed) Labs Reviewed  URINALYSIS, ROUTINE W REFLEX MICROSCOPIC - Abnormal; Notable for the following components:      Result Value   APPearance HAZY (*)    Hgb urine dipstick SMALL (*)    Bacteria, UA RARE (*)    All other components within normal limits  BASIC METABOLIC PANEL - Abnormal; Notable for the following components:   Glucose, Bld 150 (*)    All other components within normal limits  CBC WITH DIFFERENTIAL/PLATELET - Abnormal; Notable for the following components:   WBC 12.6 (*)    RBC 5.14 (*)    Hemoglobin 15.7 (*)    HCT 47.0 (*)    Neutro Abs 7.8 (*)    Eosinophils Absolute 0.8 (*)    All other components within normal limits  MAGNESIUM    EKG None  Radiology Dg Shoulder Right  Result Date: 10/21/2017 CLINICAL DATA:  Right shoulder pain. No known injury. History of rotator cuff surgery 8 years ago. EXAM: RIGHT SHOULDER - 2+ VIEW COMPARISON:  None. FINDINGS: Postoperative changes in the right shoulder with surgical anchors over the lateral humeral head. No evidence of acute  fracture or dislocation. No focal bone lesion or bone destruction. Bone cortex appears intact. Acromioclavicular and coracoclavicular spaces are maintained. Soft tissues are unremarkable. IMPRESSION: Postoperative changes in the right shoulder. No acute bony abnormalities. Electronically Signed   By: Lucienne Capers M.D.   On: 10/21/2017 21:53    Procedures Procedures (including critical care time)  Medications Ordered in ED Medications  ketorolac (TORADOL) 30 MG/ML injection 30 mg (30 mg Intramuscular Given 10/21/17 2144)     Initial Impression / Assessment and Plan / ED Course  I have reviewed the triage vital signs and the nursing  notes.  Pertinent labs & imaging results that were available during my care of the patient were reviewed by me and considered in my medical decision making (see chart for details).    Flank pain of unknown etiology.  Her shoulder and back feel better after toradol.  Pt is instructed to f/u with pcp and with ortho.  Return if worse.  Final Clinical Impressions(s) / ED Diagnoses   Final diagnoses:  Shoulder strain, right, initial encounter  Right flank pain    ED Discharge Orders        Ordered    celecoxib (CELEBREX) 200 MG capsule  2 times daily     10/21/17 2235       Isla Pence, MD 10/21/17 2237

## 2017-10-21 NOTE — ED Triage Notes (Signed)
Pt c/o right flank pain x 2 days and right shoulder pain x 5 days.

## 2017-10-29 DIAGNOSIS — I10 Essential (primary) hypertension: Secondary | ICD-10-CM | POA: Diagnosis not present

## 2017-10-29 DIAGNOSIS — E119 Type 2 diabetes mellitus without complications: Secondary | ICD-10-CM | POA: Diagnosis not present

## 2017-10-29 DIAGNOSIS — M25511 Pain in right shoulder: Secondary | ICD-10-CM | POA: Diagnosis not present

## 2017-10-29 DIAGNOSIS — R109 Unspecified abdominal pain: Secondary | ICD-10-CM | POA: Diagnosis not present

## 2017-10-30 ENCOUNTER — Other Ambulatory Visit (HOSPITAL_COMMUNITY): Payer: Self-pay | Admitting: Internal Medicine

## 2017-10-30 DIAGNOSIS — R109 Unspecified abdominal pain: Secondary | ICD-10-CM

## 2017-11-06 ENCOUNTER — Ambulatory Visit (HOSPITAL_COMMUNITY)
Admission: RE | Admit: 2017-11-06 | Discharge: 2017-11-06 | Disposition: A | Discharge status: Home / Self Care | Payer: Medicare Other | Source: Ambulatory Visit | Attending: Internal Medicine | Admitting: Internal Medicine

## 2017-11-06 DIAGNOSIS — R109 Unspecified abdominal pain: Secondary | ICD-10-CM | POA: Diagnosis not present

## 2017-11-06 DIAGNOSIS — K7689 Other specified diseases of liver: Secondary | ICD-10-CM | POA: Diagnosis not present

## 2017-11-13 DIAGNOSIS — M79601 Pain in right arm: Secondary | ICD-10-CM | POA: Diagnosis not present

## 2017-11-13 DIAGNOSIS — M542 Cervicalgia: Secondary | ICD-10-CM | POA: Diagnosis not present

## 2017-11-13 DIAGNOSIS — I679 Cerebrovascular disease, unspecified: Secondary | ICD-10-CM | POA: Diagnosis not present

## 2017-11-13 DIAGNOSIS — I10 Essential (primary) hypertension: Secondary | ICD-10-CM | POA: Diagnosis not present

## 2017-11-18 ENCOUNTER — Other Ambulatory Visit (HOSPITAL_COMMUNITY): Payer: Self-pay | Admitting: Neurology

## 2017-11-18 DIAGNOSIS — M5412 Radiculopathy, cervical region: Secondary | ICD-10-CM

## 2017-11-27 ENCOUNTER — Telehealth: Payer: Self-pay | Admitting: Orthopaedic Surgery

## 2017-11-27 NOTE — Telephone Encounter (Signed)
I have tried to call this patient multiple times to schedule her for cervical spine pain.  She has not returned my call therefore I will just wait for her to call us back..  Dr. Luna Glasgow did give approval for this patient to be seen by him

## 2017-12-04 ENCOUNTER — Ambulatory Visit (HOSPITAL_COMMUNITY): Payer: Medicare Other

## 2017-12-11 ENCOUNTER — Ambulatory Visit (HOSPITAL_COMMUNITY): Payer: Medicare Other

## 2017-12-17 ENCOUNTER — Ambulatory Visit (HOSPITAL_COMMUNITY)
Admission: RE | Admit: 2017-12-17 | Discharge: 2017-12-17 | Disposition: A | Discharge status: Home / Self Care | Payer: Medicare Other | Source: Ambulatory Visit | Attending: Neurology | Admitting: Neurology

## 2017-12-17 DIAGNOSIS — M50123 Cervical disc disorder at C6-C7 level with radiculopathy: Secondary | ICD-10-CM | POA: Diagnosis not present

## 2017-12-17 DIAGNOSIS — M4802 Spinal stenosis, cervical region: Secondary | ICD-10-CM | POA: Insufficient documentation

## 2017-12-17 DIAGNOSIS — M5412 Radiculopathy, cervical region: Secondary | ICD-10-CM | POA: Diagnosis present

## 2017-12-17 DIAGNOSIS — M542 Cervicalgia: Secondary | ICD-10-CM | POA: Diagnosis not present

## 2017-12-24 ENCOUNTER — Encounter: Payer: Self-pay | Admitting: *Deleted

## 2017-12-24 ENCOUNTER — Other Ambulatory Visit: Payer: Self-pay | Admitting: *Deleted

## 2017-12-24 ENCOUNTER — Telehealth: Payer: Self-pay | Admitting: *Deleted

## 2017-12-24 ENCOUNTER — Ambulatory Visit (INDEPENDENT_AMBULATORY_CARE_PROVIDER_SITE_OTHER): Payer: Medicare Other | Admitting: Gastroenterology

## 2017-12-24 ENCOUNTER — Encounter: Payer: Self-pay | Admitting: Gastroenterology

## 2017-12-24 DIAGNOSIS — I679 Cerebrovascular disease, unspecified: Secondary | ICD-10-CM | POA: Diagnosis not present

## 2017-12-24 DIAGNOSIS — R1013 Epigastric pain: Secondary | ICD-10-CM

## 2017-12-24 DIAGNOSIS — K760 Fatty (change of) liver, not elsewhere classified: Secondary | ICD-10-CM | POA: Diagnosis not present

## 2017-12-24 DIAGNOSIS — I6990 Unspecified sequelae of unspecified cerebrovascular disease: Secondary | ICD-10-CM | POA: Diagnosis not present

## 2017-12-24 DIAGNOSIS — K219 Gastro-esophageal reflux disease without esophagitis: Secondary | ICD-10-CM | POA: Diagnosis not present

## 2017-12-24 DIAGNOSIS — M79601 Pain in right arm: Secondary | ICD-10-CM | POA: Diagnosis not present

## 2017-12-24 DIAGNOSIS — I10 Essential (primary) hypertension: Secondary | ICD-10-CM | POA: Diagnosis not present

## 2017-12-24 DIAGNOSIS — R195 Other fecal abnormalities: Secondary | ICD-10-CM

## 2017-12-24 MED ORDER — PANTOPRAZOLE SODIUM 40 MG PO TBEC: 40.0000 mg | DELAYED_RELEASE_TABLET | Freq: Every day | ORAL | 3 refills | Status: DC

## 2017-12-24 MED ORDER — NA SULFATE-K SULFATE-MG SULF 17.5-3.13-1.6 GM/177ML PO SOLN: 1.0000 | ORAL | 0 refills | Status: DC

## 2017-12-24 NOTE — Assessment & Plan Note (Signed)
Several month history of epigastric pain worse with meals, heartburn.  Has been on Zantac for a long time but discontinued by PCP with recent reports of possible carcinogen.  On omeprazole now, symptoms poorly controlled and she complains of some epigastric pain and feeling "dry".  She previously tolerated pantoprazole.  Will start at 40 mg 30 minutes before breakfast.  Stop omeprazole.  Given her refractory symptoms, plan for EGD at time of colonoscopy.  I have discussed the risks, alternatives, benefits with regards to but not limited to the risk of reaction to medication, bleeding, infection, perforation and the patient is agreeable to proceed. Written consent to be obtained.

## 2017-12-24 NOTE — Telephone Encounter (Signed)
Pre-op scheduled for 01/19/18 at 1:45pm. Patient aware. Letter mailed.

## 2017-12-24 NOTE — Assessment & Plan Note (Signed)
Recent positive Cologuard test, patient reports remote colonoscopy over 30 years ago.  We will need to update her colonoscopy at this time.  Interestingly patient states she was told she had ulcerative colitis 30 years ago treated with a course of steroids initially.  She has not been on any medication since that time and has done well.  Evaluated at time of colonoscopy.  Plan for deep sedation given significant anxiety.  I have discussed the risks, alternatives, benefits with regards to but not limited to the risk of reaction to medication, bleeding, infection, perforation and the patient is agreeable to proceed. Written consent to be obtained.  PATIENT WITH CERVICAL SPINE DISEASE AND REQUESTS MAKING ENDO AWARE.

## 2017-12-24 NOTE — Progress Notes (Signed)
Primary Care Physician:  Rosita Fire, MD  Primary Gastroenterologist:  Garfield Cornea, MD   Chief Complaint  Patient presents with  . Colonoscopy    had tcs approx 30 yrs ago  . Gastroesophageal Reflux    recently switched from Zantac to Omeprazole    HPI:  Wendy Kramer is a 57 y.o. female here at the request of Dr. Legrand Rams schedule colonoscopy.  She had a positive Cologuard back in June.  Patient also complains of issues with reflux, epigastric pain.  Her last colonoscopy was about 30 years ago.  She had an endoscopy at the same time.  [place]  She states that she had peptic ulcer disease and was told she had ulcerative colitis.  Patient states that she was treated with Tagamet and a course of steroids.  She believes all of her symptoms were related to stress.  She left her husband after that and her symptoms resolved.  Currently having regular soft formed bowel movements.  No blood in the stool or melena.  States she had been on Zantac years but her PCP recently advised her to stop it due to reports of carcinogens.  She has been on omeprazole "dried out" having epigastric pain/burning.  Nexium because the same thing for her previously.  Previously tolerated Protonix.  She complains of epigastric pain which radiates up into the chest associated with heartburn.  Sometimes makes her cough.  No dysphagia or vomiting.  Rarely takes Advil.  No other NSAIDs.  She has anaphylaxis to aspirin.  Patient had abdominal ultrasound back in August, done for abdominal pain.  She had mildly increased hepatic echotexture consistent with fatty liver.  MRI cervical spine showed disc changes at C6-C7 canal stenosis, moderate right foraminal narrowing.  Broad disc osteophyte at C5-C6 with mild to moderate spinal stenosis moderate right C8 foraminal stenosis related to disc bulge.   Current Outpatient Medications  Medication Sig Dispense Refill  . albuterol (PROVENTIL HFA;VENTOLIN HFA) 108  (90 Base) MCG/ACT inhaler Inhale 1-2 puffs into the lungs every 6 (six) hours as needed for wheezing or shortness of breath.    . ALPRAZolam (XANAX) 0.25 MG tablet Take 0.125-0.25 mg by mouth daily as needed for anxiety.    . budesonide-formoterol (SYMBICORT) 80-4.5 MCG/ACT inhaler Inhale 1 puff into the lungs 2 (two) times daily.     . Cholecalciferol (VITAMIN D3) 1000 units CAPS Take 2,000 Units by mouth daily.    . clopidogrel (PLAVIX) 75 MG tablet Take 1 tablet (75 mg total) by mouth daily. 30 tablet 0  . cyclobenzaprine (FLEXERIL) 5 MG tablet Take 5-10 mg by mouth 3 (three) times daily as needed.  0  . fluticasone (FLONASE) 50 MCG/ACT nasal spray Place 1-2 sprays into both nostrils daily as needed for allergies or rhinitis.    Marland Kitchen lisinopril (PRINIVIL,ZESTRIL) 10 MG tablet Take 10 mg by mouth at bedtime.     Marland Kitchen omeprazole (PRILOSEC) 40 MG capsule Take 1 capsule by mouth daily.  3  . traMADol (ULTRAM) 50 MG tablet Take 50 mg by mouth daily as needed for moderate pain.   3   No current facility-administered medications for this visit.     Allergies as of 12/24/2017 - Review Complete 12/24/2017  Allergen Reaction Noted  . Asa [aspirin] Anaphylaxis 09/24/2016  . Augmentin [amoxicillin-pot clavulanate] Other (See Comments) 09/24/2016  . Levaquin [levofloxacin in d5w] Itching and Other (See Comments) 09/24/2016  . Lovenox [enoxaparin sodium] Other (See Comments) 09/24/2016  Past Medical History:  Diagnosis Date  . Arthritis   . Cardiac murmur   . Cervical spine disease   . Exogenous hyperlipidemia   . HTN (hypertension)   . Morbid obesity due to excess calories ([place])   . Other chronic pain   . Panic disorder   . Patent foramen ovale    TEE 2018  . Prediabetes   . Unspecified asthma with (acute) exacerbation   . Vitamin D deficiency     Past Surgical History:  Procedure Laterality Date  . CHOLECYSTECTOMY    . HERNIA REPAIR     umbilical  . SHOULDER ARTHROSCOPY    . TEE  WITHOUT CARDIOVERSION  2018   patent foramen ovale  . TONSILLECTOMY AND ADENOIDECTOMY    . TUBAL LIGATION      Family History  Problem Relation Age of Onset  . Breast cancer Maternal Aunt   . Colon cancer Other        maternal great grandfather    Social History   Socioeconomic History  . Marital status: Legally Separated    Spouse name: Not on file  . Number of children: Not on file  . Years of education: Not on file  . Highest education level: Not on file  Occupational History  . Not on file  Social Needs  . Financial resource strain: Not on file  . Food insecurity:    Worry: Not on file    Inability: Not on file  . Transportation needs:    Medical: Not on file    Non-medical: Not on file  Tobacco Use  . Smoking status: Current Every Day Smoker    Packs/day: 0.50    Types: Cigarettes  . Smokeless tobacco: Never Used  Substance and Sexual Activity  . Alcohol use: No  . Drug use: No  . Sexual activity: Not on file  Lifestyle  . Physical activity:    Days per week: Not on file    Minutes per session: Not on file  . Stress: Not on file  Relationships  . Social connections:    Talks on phone: Not on file    Gets together: Not on file    Attends religious service: Not on file    Active member of club or organization: Not on file    Attends meetings of clubs or organizations: Not on file    Relationship status: Not on file  . Intimate partner violence:    Fear of current or ex partner: Not on file    Emotionally abused: Not on file    Physically abused: Not on file    Forced sexual activity: Not on file  Other Topics Concern  . Not on file  Social History Narrative  . Not on file      ROS:  General: Negative for anorexia, weight loss, fever, chills, fatigue, weakness. Eyes: Negative for vision changes.  ENT: Negative for hoarseness, difficulty swallowing , nasal congestion. CV: Negative for chest pain, angina, palpitations, dyspnea on exertion,  peripheral edema.  Respiratory: Negative for dyspnea at rest, dyspnea on exertion, cough, sputum, wheezing.  GI: See history of present illness. GU:  Negative for dysuria, hematuria, urinary incontinence, urinary frequency, nocturnal urination.  MS: Negative for joint pain, low back pain.  Derm: Negative for rash or itching.  Neuro: Negative for weakness, abnormal sensation, seizure, frequent headaches, memory loss, confusion.  Psych: Negative for anxiety, depression, suicidal ideation, hallucinations.  Endo: Negative for unusual weight change.  Heme: Negative for  bruising or bleeding. Allergy: Negative for rash or hives.    Physical Examination:  BP (!) 152/81   Pulse 75   Temp (!) 97.2 F (36.2 C) (Oral)   Ht 5\' 4"  (1.626 m)   Wt 239 lb 3.2 oz (108.5 kg)   LMP 09/06/2016   BMI 41.06 kg/m    General: Well-nourished, well-developed in no acute distress.  Head: Normocephalic, atraumatic.   Eyes: Conjunctiva pink, no icterus. Mouth: Oropharyngeal mucosa moist and pink , no lesions erythema or exudate. Neck: Supple without thyromegaly, masses, or lymphadenopathy.  Lungs: Clear to auscultation bilaterally.  Heart: Regular rate and rhythm, no murmurs rubs or gallops.  Abdomen: Bowel sounds are normal, nontender, nondistended, no hepatosplenomegaly or masses, no abdominal bruits or    hernia , no rebound or guarding.   Rectal: not performed Extremities: No lower extremity edema. No clubbing or deformities.  Neuro: Alert and oriented x 4 , grossly normal neurologically.  Skin: Warm and dry, no rash or jaundice.   Psych: Alert and cooperative, normal mood and affect.  Labs: Lab Results  Component Value Date   CREATININE 0.91 10/21/2017   BUN 13 10/21/2017   NA 137 10/21/2017   K 4.0 10/21/2017   CL 104 10/21/2017   CO2 25 10/21/2017   Lab Results  Component Value Date   WBC 12.6 (H) 10/21/2017   HGB 15.7 (H) 10/21/2017   HCT 47.0 (H) 10/21/2017   MCV 91.4 10/21/2017    PLT 222 10/21/2017     Imaging Studies: Mr Cervical Spine Wo Contrast  Result Date: 12/18/2017 CLINICAL DATA:  Initial evaluation for right-sided neck pain with tightness. EXAM: MRI CERVICAL SPINE WITHOUT CONTRAST TECHNIQUE: Multiplanar, multisequence MR imaging of the cervical spine was performed. No intravenous contrast was administered. COMPARISON:  Prior CT from 06/02/2017. FINDINGS: Alignment: Smooth reversal of the normal cervical lordosis. Trace anterolisthesis of C3 on C4 and C4 on C5. Vertebrae: Vertebral body heights maintained without evidence for acute or chronic fracture. Bone marrow signal intensity within normal limits. No discrete or worrisome osseous lesions. No abnormal marrow edema. Cord: Signal intensity within the cervical spinal cord is normal. Posterior Fossa, vertebral arteries, paraspinal tissues: Visualized brain and posterior fossa within normal limits. Craniocervical junction normal. Paraspinous soft tissues within normal limits. Normal intravascular flow voids present within the vertebral arteries bilaterally. Disc levels: C2-C3: Mild bilateral facet hypertrophy.  No stenosis. C3-C4: Trace anterolisthesis. Left greater than right facet hypertrophy. No significant stenosis. C4-C5: Trace anterolisthesis. Mild disc bulge with bilateral facet hypertrophy. No significant stenosis. C5-C6: Diffuse degenerative disc osteophyte with bilateral uncovertebral hypertrophy. Broad posterior component flattens the ventral thecal sac. Mild facet ligament flavum hypertrophy. Resultant mild to moderate spinal stenosis with mild cord flattening. Foramina remain patent. C6-C7: Diffuse degenerative disc osteophyte with intervertebral disc space narrowing. CSF is effaced with flattening of the cervical spinal cord. Moderate to severe spinal stenosis with the thecal sac measuring 6 mm in AP diameter. Moderate right with mild left C7 foraminal narrowing. C7-T1: Mild diffuse disc bulge with uncovertebral  hypertrophy. Bilateral facet hypertrophy. Resultant mild spinal stenosis. Moderate right C8 foraminal narrowing due to disc bulge and uncovertebral hypertrophy. Visualized upper thoracic spine within normal limits. IMPRESSION: 1. Degenerative disc osteophyte at C6-7 with resultant moderate to severe canal stenosis with moderate right C7 foraminal narrowing. 2. Broad disc osteophyte at C5-6 with resultant mild to moderate spinal stenosis with mild cord flattening. 3. Moderate right C8 foraminal stenosis related to disc bulge and uncovertebral hypertrophy.  Electronically Signed   By: Jeannine Boga M.D.   On: 12/18/2017 06:44

## 2017-12-24 NOTE — Patient Instructions (Signed)
Upper endoscopy and colonoscopy as scheduled. See separate instructions.   Pantoprazole once daily before breakfast. Stop omeprazole.   Instructions for fatty liver: Recommend 1-2# weight loss per week until ideal body weight through exercise & diet. Low fat/cholesterol diet.   Avoid sweets, sodas, fruit juices, sweetened beverages like tea, etc. Gradually increase exercise from 15 min daily up to 1 hr per day 5 days/week. Limit alcohol use.

## 2017-12-24 NOTE — Assessment & Plan Note (Signed)
Fatty liver on prior imaging.  Patient reports LFTs have remained normal.  Suggest checking 1-2 times yearly.  Instructions for fatty liver: Recommend 1-2# weight loss per week until ideal body weight through exercise & diet. Low fat/cholesterol diet.   Avoid sweets, sodas, fruit juices, sweetened beverages like tea, etc. Gradually increase exercise from 15 min daily up to 1 hr per day 5 days/week. Limit alcohol use.

## 2017-12-25 NOTE — Progress Notes (Signed)
CC'D TO PCP °

## 2018-01-13 NOTE — Patient Instructions (Signed)
Wendy Kramer  [id number]     @PREFPERIOPPHARMACY @   Your procedure is scheduled on  01/22/2018.  Report to [facility] at  645  A.M.  Call this number if you have problems the morning of surgery:  [phone]   Remember: Follow the diet and prep instructions given to you by Dr Roseanne Kaufman office.                      Take these medicines the morning of surgery with A SIP OF WATER  Xanax, flexaril, lisinopril, protonix, ultram.    Do not wear jewelry, make-up or nail polish.  Do not wear lotions, powders, or perfumes, or deodorant.  Do not shave 48 hours prior to surgery.  Men may shave face and neck.  Do not bring valuables to the hospital.  [facility] is not responsible for any belongings or valuables.  Contacts, dentures or bridgework may not be worn into surgery.  Leave your suitcase in the car.  After surgery it may be brought to your room.  For patients admitted to the hospital, discharge time will be determined by your treatment team.  Patients discharged the day of surgery will not be allowed to drive home.   Name and phone number of your driver:   family Special instructions: None  Please read over the following fact sheets that you were given. Anesthesia Post-op Instructions and Care and Recovery After Surgery       Esophagogastroduodenoscopy Esophagogastroduodenoscopy (EGD) is a procedure to examine the lining of the esophagus, stomach, and first part of the small intestine (duodenum). This procedure is done to check for problems such as inflammation, bleeding, ulcers, or growths. During this procedure, a long, flexible, lighted tube with a camera attached (endoscope) is inserted down the throat. Tell a health care provider about:  Any allergies you have.  All medicines you are taking, including vitamins, herbs, eye drops, creams, and over-the-counter medicines.  Any problems you or family members have had with anesthetic medicines.  Any  blood disorders you have.  Any surgeries you have had.  Any medical conditions you have.  Whether you are pregnant or may be pregnant. What are the risks? Generally, this is a safe procedure. However, problems may occur, including:  Infection.  Bleeding.  A tear (perforation) in the esophagus, stomach, or duodenum.  Trouble breathing.  Excessive sweating.  Spasms of the larynx.  A slowed heartbeat.  Low blood pressure.  What happens before the procedure?  Follow instructions from your health care provider about eating or drinking restrictions.  Ask your health care provider about: ? Changing or stopping your regular medicines. This is especially important if you are taking diabetes medicines or blood thinners. ? Taking medicines such as aspirin and ibuprofen. These medicines can thin your blood. Do not take these medicines before your procedure if your health care provider instructs you not to.  Plan to have someone take you home after the procedure.  If you wear dentures, be ready to remove them before the procedure. What happens during the procedure?  To reduce your risk of infection, your health care team will wash or sanitize their hands.  An IV tube will be put in a vein in your hand or arm. You will get medicines and fluids through this tube.  You will be given one or more of the following: ? A medicine to help you relax (sedative). ? A  medicine to numb the area (local anesthetic). This medicine may be sprayed into your throat. It will make you feel more comfortable and keep you from gagging or coughing during the procedure. ? A medicine for pain.  A mouth guard may be placed in your mouth to protect your teeth and to keep you from biting on the endoscope.  You will be asked to lie on your left side.  The endoscope will be lowered down your throat into your esophagus, stomach, and duodenum.  Air will be put into the endoscope. This will help your health  care provider see better.  The lining of your esophagus, stomach, and duodenum will be examined.  Your health care provider may: ? Take a tissue sample so it can be looked at in a lab (biopsy). ? Remove growths. ? Remove objects (foreign bodies) that are stuck. ? Treat any bleeding with medicines or other devices that stop tissue from bleeding. ? Widen (dilate) or stretch narrowed areas of your esophagus and stomach.  The endoscope will be taken out. The procedure may vary among health care providers and hospitals. What happens after the procedure?  Your blood pressure, heart rate, breathing rate, and blood oxygen level will be monitored often until the medicines you were given have worn off.  Do not eat or drink anything until the numbing medicine has worn off and your gag reflex has returned. This information is not intended to replace advice given to you by your health care provider. Make sure you discuss any questions you have with your health care provider. Document Released: 07/19/2004 Document Revised: 08/24/2015 Document Reviewed: 02/09/2015 Elsevier Interactive Patient Education  2018 [employer]. Esophagogastroduodenoscopy, Care After Refer to this sheet in the next few weeks. These instructions provide you with information about caring for yourself after your procedure. Your health care provider may also give you more specific instructions. Your treatment has been planned according to current medical practices, but problems sometimes occur. Call your health care provider if you have any problems or questions after your procedure. What can I expect after the procedure? After the procedure, it is common to have:  A sore throat.  Nausea.  Bloating.  Dizziness.  Fatigue.  Follow these instructions at home:  Do not eat or drink anything until the numbing medicine (local anesthetic) has worn off and your gag reflex has returned. You will know that the local anesthetic has  worn off when you can swallow comfortably.  Do not drive for 24 hours if you received a medicine to help you relax (sedative).  If your health care provider took a tissue sample for testing during the procedure, make sure to get your test results. This is your responsibility. Ask your health care provider or the department performing the test when your results will be ready.  Keep all follow-up visits as told by your health care provider. This is important. Contact a health care provider if:  You cannot stop coughing.  You are not urinating.  You are urinating less than usual. Get help right away if:  You have trouble swallowing.  You cannot eat or drink.  You have throat or chest pain that gets worse.  You are dizzy or light-headed.  You faint.  You have nausea or vomiting.  You have chills.  You have a fever.  You have severe abdominal pain.  You have black, tarry, or bloody stools. This information is not intended to replace advice given to you by your health care  provider. Make sure you discuss any questions you have with your health care provider. Document Released: 03/04/2012 Document Revised: 08/24/2015 Document Reviewed: 02/09/2015 Elsevier Interactive Patient Education  2018 [employer].  Colonoscopy, Adult A colonoscopy is an exam to look at the large intestine. It is done to check for problems, such as:  Lumps (tumors).  Growths (polyps).  Swelling (inflammation).  Bleeding.  What happens before the procedure? Eating and drinking Follow instructions from your doctor about eating and drinking. These instructions may include:  A few days before the procedure - follow a low-fiber diet. ? Avoid nuts. ? Avoid seeds. ? Avoid dried fruit. ? Avoid raw fruits. ? Avoid vegetables.  1-3 days before the procedure - follow a clear liquid diet. Avoid liquids that have red or purple dye. Drink only clear liquids, such as: ? Clear broth or bouillon. ? Black  coffee or tea. ? Clear juice. ? Clear soft drinks or sports drinks. ? Gelatin dessert. ? Popsicles.  On the day of the procedure - do not eat or drink anything during the 2 hours before the procedure.  Bowel prep If you were prescribed an oral bowel prep:  Take it as told by your doctor. Starting the day before your procedure, you will need to drink a lot of liquid. The liquid will cause you to poop (have bowel movements) until your poop is almost clear or light green.  If your skin or butt gets irritated from diarrhea, you may: ? Wipe the area with wipes that have medicine in them, such as adult wet wipes with aloe and vitamin E. ? Put something on your skin that soothes the area, such as petroleum jelly.  If you throw up (vomit) while drinking the bowel prep, take a break for up to 60 minutes. Then begin the bowel prep again. If you keep throwing up and you cannot take the bowel prep without throwing up, call your doctor.  General instructions  Ask your doctor about changing or stopping your normal medicines. This is important if you take diabetes medicines or blood thinners.  Plan to have someone take you home from the hospital or clinic. What happens during the procedure?  An IV tube may be put into one of your veins.  You will be given medicine to help you relax (sedative).  To reduce your risk of infection: ? Your doctors will wash their hands. ? Your anal area will be washed with soap.  You will be asked to lie on your side with your knees bent.  Your doctor will get a long, thin, flexible tube ready. The tube will have a camera and a light on the end.  The tube will be put into your anus.  The tube will be gently put into your large intestine.  Air will be delivered into your large intestine to keep it open. You may feel some pressure or cramping.  The camera will be used to take photos.  A small tissue sample may be removed from your body to be looked at under a  microscope (biopsy). If any possible problems are found, the tissue will be sent to a lab for testing.  If small growths are found, your doctor may remove them and have them checked for cancer.  The tube that was put into your anus will be slowly removed. The procedure may vary among doctors and hospitals. What happens after the procedure?  Your doctor will check on you often until the medicines you were given  have worn off.  Do not drive for 24 hours after the procedure.  You may have a small amount of blood in your poop.  You may pass gas.  You may have mild cramps or bloating in your belly (abdomen).  It is up to you to get the results of your procedure. Ask your doctor, or the department performing the procedure, when your results will be ready. This information is not intended to replace advice given to you by your health care provider. Make sure you discuss any questions you have with your health care provider. Document Released: 04/20/2010 Document Revised: 01/17/2016 Document Reviewed: 05/30/2015 Elsevier Interactive Patient Education  2017 [employer] Colonoscopy, Adult, Care After This sheet gives you information about how to care for yourself after your procedure. Your health care provider may also give you more specific instructions. If you have problems or questions, contact your health care provider. What can I expect after the procedure? After the procedure, it is common to have:  A small amount of blood in your stool for 24 hours after the procedure.  Some gas.  Mild abdominal cramping or bloating.  Follow these instructions at home: General instructions   For the first 24 hours after the procedure: ? Do not drive or use machinery. ? Do not sign important documents. ? Do not drink alcohol. ? Do your regular daily activities at a slower pace than normal. ? Eat soft, easy-to-digest foods. ? Rest often.  Take over-the-counter or prescription medicines only  as told by your health care provider.  It is up to you to get the results of your procedure. Ask your health care provider, or the department performing the procedure, when your results will be ready. Relieving cramping and bloating  Try walking around when you have cramps or feel bloated.  Apply heat to your abdomen as told by your health care provider. Use a heat source that your health care provider recommends, such as a moist heat pack or a heating pad. ? Place a towel between your skin and the heat source. ? Leave the heat on for 20-30 minutes. ? Remove the heat if your skin turns bright red. This is especially important if you are unable to feel pain, heat, or cold. You may have a greater risk of getting burned. Eating and drinking  Drink enough fluid to keep your urine clear or pale yellow.  Resume your normal diet as instructed by your health care provider. Avoid heavy or fried foods that are hard to digest.  Avoid drinking alcohol for as long as instructed by your health care provider. Contact a health care provider if:  You have blood in your stool 2-3 days after the procedure. Get help right away if:  You have more than a small spotting of blood in your stool.  You pass large blood clots in your stool.  Your abdomen is swollen.  You have nausea or vomiting.  You have a fever.  You have increasing abdominal pain that is not relieved with medicine. This information is not intended to replace advice given to you by your health care provider. Make sure you discuss any questions you have with your health care provider. Document Released: 10/31/2003 Document Revised: 12/11/2015 Document Reviewed: 05/30/2015 Elsevier Interactive Patient Education  2018 [place] Anesthesia is a term that refers to techniques, procedures, and medicines that help a person stay safe and comfortable during a medical procedure. Monitored anesthesia care,  or sedation, is one type of anesthesia. Your anesthesia specialist may recommend sedation if you will be having a procedure that does not require you to be unconscious, such as:  Cataract surgery.  A dental procedure.  A biopsy.  A colonoscopy.  During the procedure, you may receive a medicine to help you relax (sedative). There are three levels of sedation:  Mild sedation. At this level, you may feel awake and relaxed. You will be able to follow directions.  Moderate sedation. At this level, you will be sleepy. You may not remember the procedure.  Deep sedation. At this level, you will be asleep. You will not remember the procedure.  The more medicine you are given, the deeper your level of sedation will be. Depending on how you respond to the procedure, the anesthesia specialist may change your level of sedation or the type of anesthesia to fit your needs. An anesthesia specialist will monitor you closely during the procedure. Let your health care provider know about:  Any allergies you have.  All medicines you are taking, including vitamins, herbs, eye drops, creams, and over-the-counter medicines.  Any use of steroids (by mouth or as a cream).  Any problems you or family members have had with sedatives and anesthetic medicines.  Any blood disorders you have.  Any surgeries you have had.  Any medical conditions you have, such as sleep apnea.  Whether you are pregnant or may be pregnant.  Any use of cigarettes, alcohol, or street drugs. What are the risks? Generally, this is a safe procedure. However, problems may occur, including:  Getting too much medicine (oversedation).  Nausea.  Allergic reaction to medicines.  Trouble breathing. If this happens, a breathing tube may be used to help with breathing. It will be removed when you are awake and breathing on your own.  Heart trouble.  Lung trouble.  Before the procedure Staying hydrated Follow instructions from  your health care provider about hydration, which may include:  Up to 2 hours before the procedure - you may continue to drink clear liquids, such as water, clear fruit juice, black coffee, and plain tea.  Eating and drinking restrictions Follow instructions from your health care provider about eating and drinking, which may include:  8 hours before the procedure - stop eating heavy meals or foods such as meat, fried foods, or fatty foods.  6 hours before the procedure - stop eating light meals or foods, such as toast or cereal.  6 hours before the procedure - stop drinking milk or drinks that contain milk.  2 hours before the procedure - stop drinking clear liquids.  Medicines Ask your health care provider about:  Changing or stopping your regular medicines. This is especially important if you are taking diabetes medicines or blood thinners.  Taking medicines such as aspirin and ibuprofen. These medicines can thin your blood. Do not take these medicines before your procedure if your health care provider instructs you not to.  Tests and exams  You will have a physical exam.  You may have blood tests done to show: ? How well your kidneys and liver are working. ? How well your blood can clot.  General instructions  Plan to have someone take you home from the hospital or clinic.  If you will be going home right after the procedure, plan to have someone with you for 24 hours.  What happens during the procedure?  Your blood pressure, heart rate, breathing, level of pain and overall condition  will be monitored.  An IV tube will be inserted into one of your veins.  Your anesthesia specialist will give you medicines as needed to keep you comfortable during the procedure. This may mean changing the level of sedation.  The procedure will be performed. After the procedure  Your blood pressure, heart rate, breathing rate, and blood oxygen level will be monitored until the medicines  you were given have worn off.  Do not drive for 24 hours if you received a sedative.  You may: ? Feel sleepy, clumsy, or nauseous. ? Feel forgetful about what happened after the procedure. ? Have a sore throat if you had a breathing tube during the procedure. ? Vomit. This information is not intended to replace advice given to you by your health care provider. Make sure you discuss any questions you have with your health care provider. Document Released: 12/12/2004 Document Revised: 08/25/2015 Document Reviewed: 07/09/2015 Elsevier Interactive Patient Education  2018 [place], Care After These instructions provide you with information about caring for yourself after your procedure. Your health care provider may also give you more specific instructions. Your treatment has been planned according to current medical practices, but problems sometimes occur. Call your health care provider if you have any problems or questions after your procedure. What can I expect after the procedure? After your procedure, it is common to:  Feel sleepy for several hours.  Feel clumsy and have poor balance for several hours.  Feel forgetful about what happened after the procedure.  Have poor judgment for several hours.  Feel nauseous or vomit.  Have a sore throat if you had a breathing tube during the procedure.  Follow these instructions at home: For at least 24 hours after the procedure:   Do not: ? Participate in activities in which you could fall or become injured. ? Drive. ? Use heavy machinery. ? Drink alcohol. ? Take sleeping pills or medicines that cause drowsiness. ? Make important decisions or sign legal documents. ? Take care of children on your own.  Rest. Eating and drinking  Follow the diet that is recommended by your health care provider.  If you vomit, drink water, juice, or soup when you can drink without vomiting.  Make sure you have little  or no nausea before eating solid foods. General instructions  Have a responsible adult stay with you until you are awake and alert.  Take over-the-counter and prescription medicines only as told by your health care provider.  If you smoke, do not smoke without supervision.  Keep all follow-up visits as told by your health care provider. This is important. Contact a health care provider if:  You keep feeling nauseous or you keep vomiting.  You feel light-headed.  You develop a rash.  You have a fever. Get help right away if:  You have trouble breathing. This information is not intended to replace advice given to you by your health care provider. Make sure you discuss any questions you have with your health care provider. Document Released: 07/09/2015 Document Revised: 11/08/2015 Document Reviewed: 07/09/2015 Elsevier Interactive Patient Education  [employer].

## 2018-01-16 ENCOUNTER — Telehealth: Payer: Self-pay | Admitting: *Deleted

## 2018-01-16 NOTE — Telephone Encounter (Signed)
Received called from Endo. Patient needed to r/s her procedure. Patient was advised to call the office. I do not see where patient has called.  Patient is checking with family member to see when they can accompany her for the procedure and will call back to r/s.

## 2018-01-19 ENCOUNTER — Encounter (HOSPITAL_COMMUNITY): Payer: Self-pay

## 2018-01-19 ENCOUNTER — Encounter (HOSPITAL_COMMUNITY)
Admission: RE | Admit: 2018-01-19 | Discharge: 2018-01-19 | Disposition: A | Discharge status: Home / Self Care | Payer: Medicare Other | Source: Ambulatory Visit | Attending: Internal Medicine | Admitting: Internal Medicine

## 2018-01-19 NOTE — Telephone Encounter (Signed)
Patient called back and is r/s'd to 03/05/18 at 7:30am. New instructions have been mailed. She already has prep at home. New pre-op scheduled for 03/02/18 at 10am. Patient aware.

## 2018-02-03 DIAGNOSIS — H2513 Age-related nuclear cataract, bilateral: Secondary | ICD-10-CM | POA: Diagnosis not present

## 2018-02-03 DIAGNOSIS — E119 Type 2 diabetes mellitus without complications: Secondary | ICD-10-CM | POA: Diagnosis not present

## 2018-02-05 DIAGNOSIS — F17210 Nicotine dependence, cigarettes, uncomplicated: Secondary | ICD-10-CM | POA: Diagnosis not present

## 2018-02-05 DIAGNOSIS — J4540 Moderate persistent asthma, uncomplicated: Secondary | ICD-10-CM | POA: Diagnosis not present

## 2018-02-05 DIAGNOSIS — E119 Type 2 diabetes mellitus without complications: Secondary | ICD-10-CM | POA: Diagnosis not present

## 2018-02-05 DIAGNOSIS — I10 Essential (primary) hypertension: Secondary | ICD-10-CM | POA: Diagnosis not present

## 2018-02-17 NOTE — Patient Instructions (Signed)
Wendy Kramer  [id number]     @PREFPERIOPPHARMACY @   Your procedure is scheduled on  03/05/2018 .  Report to [facility] at  615   A.M.  Call this number if you have problems the morning of surgery:  [phone]   Remember:  Follow the diet and prep instructions given to you by Dr Roseanne Kaufman office.                      Take these medicines the morning of surgery with A SIP OF WATER  Xanax ( if needed), flexaril (if needed), lisinopril, protonix, tramadol ( if needed). Use your inhalers before you come.    Do not wear jewelry, make-up or nail polish.  Do not wear lotions, powders, or perfumes, or deodorant.  Do not shave 48 hours prior to surgery.  Men may shave face and neck.  Do not bring valuables to the hospital.  [facility] is not responsible for any belongings or valuables.  Contacts, dentures or bridgework may not be worn into surgery.  Leave your suitcase in the car.  After surgery it may be brought to your room.  For patients admitted to the hospital, discharge time will be determined by your treatment team.  Patients discharged the day of surgery will not be allowed to drive home.   Name and phone number of your driver:   family Special instructions:  Follow and instructions given to you about your plavix.  Please read over the following fact sheets that you were given. Anesthesia Post-op Instructions and Care and Recovery After Surgery       Esophagogastroduodenoscopy Esophagogastroduodenoscopy (EGD) is a procedure to examine the lining of the esophagus, stomach, and first part of the small intestine (duodenum). This procedure is done to check for problems such as inflammation, bleeding, ulcers, or growths. During this procedure, a long, flexible, lighted tube with a camera attached (endoscope) is inserted down the throat. Tell a health care provider about:  Any allergies you have.  All medicines you are taking, including vitamins,  herbs, eye drops, creams, and over-the-counter medicines.  Any problems you or family members have had with anesthetic medicines.  Any blood disorders you have.  Any surgeries you have had.  Any medical conditions you have.  Whether you are pregnant or may be pregnant. What are the risks? Generally, this is a safe procedure. However, problems may occur, including:  Infection.  Bleeding.  A tear (perforation) in the esophagus, stomach, or duodenum.  Trouble breathing.  Excessive sweating.  Spasms of the larynx.  A slowed heartbeat.  Low blood pressure.  What happens before the procedure?  Follow instructions from your health care provider about eating or drinking restrictions.  Ask your health care provider about: ? Changing or stopping your regular medicines. This is especially important if you are taking diabetes medicines or blood thinners. ? Taking medicines such as aspirin and ibuprofen. These medicines can thin your blood. Do not take these medicines before your procedure if your health care provider instructs you not to.  Plan to have someone take you home after the procedure.  If you wear dentures, be ready to remove them before the procedure. What happens during the procedure?  To reduce your risk of infection, your health care team will wash or sanitize their hands.  An IV tube will be put in a vein in your hand or arm.  You will get medicines and fluids through this tube.  You will be given one or more of the following: ? A medicine to help you relax (sedative). ? A medicine to numb the area (local anesthetic). This medicine may be sprayed into your throat. It will make you feel more comfortable and keep you from gagging or coughing during the procedure. ? A medicine for pain.  A mouth guard may be placed in your mouth to protect your teeth and to keep you from biting on the endoscope.  You will be asked to lie on your left side.  The endoscope will be  lowered down your throat into your esophagus, stomach, and duodenum.  Air will be put into the endoscope. This will help your health care provider see better.  The lining of your esophagus, stomach, and duodenum will be examined.  Your health care provider may: ? Take a tissue sample so it can be looked at in a lab (biopsy). ? Remove growths. ? Remove objects (foreign bodies) that are stuck. ? Treat any bleeding with medicines or other devices that stop tissue from bleeding. ? Widen (dilate) or stretch narrowed areas of your esophagus and stomach.  The endoscope will be taken out. The procedure may vary among health care providers and hospitals. What happens after the procedure?  Your blood pressure, heart rate, breathing rate, and blood oxygen level will be monitored often until the medicines you were given have worn off.  Do not eat or drink anything until the numbing medicine has worn off and your gag reflex has returned. This information is not intended to replace advice given to you by your health care provider. Make sure you discuss any questions you have with your health care provider. Document Released: 07/19/2004 Document Revised: 08/24/2015 Document Reviewed: 02/09/2015 Elsevier Interactive Patient Education  2018 [employer]. Esophagogastroduodenoscopy, Care After Refer to this sheet in the next few weeks. These instructions provide you with information about caring for yourself after your procedure. Your health care provider may also give you more specific instructions. Your treatment has been planned according to current medical practices, but problems sometimes occur. Call your health care provider if you have any problems or questions after your procedure. What can I expect after the procedure? After the procedure, it is common to have:  A sore throat.  Nausea.  Bloating.  Dizziness.  Fatigue.  Follow these instructions at home:  Do not eat or drink anything  until the numbing medicine (local anesthetic) has worn off and your gag reflex has returned. You will know that the local anesthetic has worn off when you can swallow comfortably.  Do not drive for 24 hours if you received a medicine to help you relax (sedative).  If your health care provider took a tissue sample for testing during the procedure, make sure to get your test results. This is your responsibility. Ask your health care provider or the department performing the test when your results will be ready.  Keep all follow-up visits as told by your health care provider. This is important. Contact a health care provider if:  You cannot stop coughing.  You are not urinating.  You are urinating less than usual. Get help right away if:  You have trouble swallowing.  You cannot eat or drink.  You have throat or chest pain that gets worse.  You are dizzy or light-headed.  You faint.  You have nausea or vomiting.  You have chills.  You have a  fever.  You have severe abdominal pain.  You have black, tarry, or bloody stools. This information is not intended to replace advice given to you by your health care provider. Make sure you discuss any questions you have with your health care provider. Document Released: 03/04/2012 Document Revised: 08/24/2015 Document Reviewed: 02/09/2015 Elsevier Interactive Patient Education  2018 [employer].  Colonoscopy, Adult A colonoscopy is an exam to look at the large intestine. It is done to check for problems, such as:  Lumps (tumors).  Growths (polyps).  Swelling (inflammation).  Bleeding.  What happens before the procedure? Eating and drinking Follow instructions from your doctor about eating and drinking. These instructions may include:  A few days before the procedure - follow a low-fiber diet. ? Avoid nuts. ? Avoid seeds. ? Avoid dried fruit. ? Avoid raw fruits. ? Avoid vegetables.  1-3 days before the procedure -  follow a clear liquid diet. Avoid liquids that have red or purple dye. Drink only clear liquids, such as: ? Clear broth or bouillon. ? Black coffee or tea. ? Clear juice. ? Clear soft drinks or sports drinks. ? Gelatin dessert. ? Popsicles.  On the day of the procedure - do not eat or drink anything during the 2 hours before the procedure.  Bowel prep If you were prescribed an oral bowel prep:  Take it as told by your doctor. Starting the day before your procedure, you will need to drink a lot of liquid. The liquid will cause you to poop (have bowel movements) until your poop is almost clear or light green.  If your skin or butt gets irritated from diarrhea, you may: ? Wipe the area with wipes that have medicine in them, such as adult wet wipes with aloe and vitamin E. ? Put something on your skin that soothes the area, such as petroleum jelly.  If you throw up (vomit) while drinking the bowel prep, take a break for up to 60 minutes. Then begin the bowel prep again. If you keep throwing up and you cannot take the bowel prep without throwing up, call your doctor.  General instructions  Ask your doctor about changing or stopping your normal medicines. This is important if you take diabetes medicines or blood thinners.  Plan to have someone take you home from the hospital or clinic. What happens during the procedure?  An IV tube may be put into one of your veins.  You will be given medicine to help you relax (sedative).  To reduce your risk of infection: ? Your doctors will wash their hands. ? Your anal area will be washed with soap.  You will be asked to lie on your side with your knees bent.  Your doctor will get a long, thin, flexible tube ready. The tube will have a camera and a light on the end.  The tube will be put into your anus.  The tube will be gently put into your large intestine.  Air will be delivered into your large intestine to keep it open. You may feel some  pressure or cramping.  The camera will be used to take photos.  A small tissue sample may be removed from your body to be looked at under a microscope (biopsy). If any possible problems are found, the tissue will be sent to a lab for testing.  If small growths are found, your doctor may remove them and have them checked for cancer.  The tube that was put into your anus will  be slowly removed. The procedure may vary among doctors and hospitals. What happens after the procedure?  Your doctor will check on you often until the medicines you were given have worn off.  Do not drive for 24 hours after the procedure.  You may have a small amount of blood in your poop.  You may pass gas.  You may have mild cramps or bloating in your belly (abdomen).  It is up to you to get the results of your procedure. Ask your doctor, or the department performing the procedure, when your results will be ready. This information is not intended to replace advice given to you by your health care provider. Make sure you discuss any questions you have with your health care provider. Document Released: 04/20/2010 Document Revised: 01/17/2016 Document Reviewed: 05/30/2015 Elsevier Interactive Patient Education  2017 [employer]  Colonoscopy, Adult, Care After This sheet gives you information about how to care for yourself after your procedure. Your health care provider may also give you more specific instructions. If you have problems or questions, contact your health care provider. What can I expect after the procedure? After the procedure, it is common to have:  A small amount of blood in your stool for 24 hours after the procedure.  Some gas.  Mild abdominal cramping or bloating.  Follow these instructions at home: General instructions   For the first 24 hours after the procedure: ? Do not drive or use machinery. ? Do not sign important documents. ? Do not drink alcohol. ? Do your regular daily  activities at a slower pace than normal. ? Eat soft, easy-to-digest foods. ? Rest often.  Take over-the-counter or prescription medicines only as told by your health care provider.  It is up to you to get the results of your procedure. Ask your health care provider, or the department performing the procedure, when your results will be ready. Relieving cramping and bloating  Try walking around when you have cramps or feel bloated.  Apply heat to your abdomen as told by your health care provider. Use a heat source that your health care provider recommends, such as a moist heat pack or a heating pad. ? Place a towel between your skin and the heat source. ? Leave the heat on for 20-30 minutes. ? Remove the heat if your skin turns bright red. This is especially important if you are unable to feel pain, heat, or cold. You may have a greater risk of getting burned. Eating and drinking  Drink enough fluid to keep your urine clear or pale yellow.  Resume your normal diet as instructed by your health care provider. Avoid heavy or fried foods that are hard to digest.  Avoid drinking alcohol for as long as instructed by your health care provider. Contact a health care provider if:  You have blood in your stool 2-3 days after the procedure. Get help right away if:  You have more than a small spotting of blood in your stool.  You pass large blood clots in your stool.  Your abdomen is swollen.  You have nausea or vomiting.  You have a fever.  You have increasing abdominal pain that is not relieved with medicine. This information is not intended to replace advice given to you by your health care provider. Make sure you discuss any questions you have with your health care provider. Document Released: 10/31/2003 Document Revised: 12/11/2015 Document Reviewed: 05/30/2015 Elsevier Interactive Patient Education  2018 [employer] Anesthesia  Care Anesthesia is a term that refers to  techniques, procedures, and medicines that help a person stay safe and comfortable during a medical procedure. Monitored anesthesia care, or sedation, is one type of anesthesia. Your anesthesia specialist may recommend sedation if you will be having a procedure that does not require you to be unconscious, such as:  Cataract surgery.  A dental procedure.  A biopsy.  A colonoscopy.  During the procedure, you may receive a medicine to help you relax (sedative). There are three levels of sedation:  Mild sedation. At this level, you may feel awake and relaxed. You will be able to follow directions.  Moderate sedation. At this level, you will be sleepy. You may not remember the procedure.  Deep sedation. At this level, you will be asleep. You will not remember the procedure.  The more medicine you are given, the deeper your level of sedation will be. Depending on how you respond to the procedure, the anesthesia specialist may change your level of sedation or the type of anesthesia to fit your needs. An anesthesia specialist will monitor you closely during the procedure. Let your health care provider know about:  Any allergies you have.  All medicines you are taking, including vitamins, herbs, eye drops, creams, and over-the-counter medicines.  Any use of steroids (by mouth or as a cream).  Any problems you or family members have had with sedatives and anesthetic medicines.  Any blood disorders you have.  Any surgeries you have had.  Any medical conditions you have, such as sleep apnea.  Whether you are pregnant or may be pregnant.  Any use of cigarettes, alcohol, or street drugs. What are the risks? Generally, this is a safe procedure. However, problems may occur, including:  Getting too much medicine (oversedation).  Nausea.  Allergic reaction to medicines.  Trouble breathing. If this happens, a breathing tube may be used to help with breathing. It will be removed when you  are awake and breathing on your own.  Heart trouble.  Lung trouble.  Before the procedure Staying hydrated Follow instructions from your health care provider about hydration, which may include:  Up to 2 hours before the procedure - you may continue to drink clear liquids, such as water, clear fruit juice, black coffee, and plain tea.  Eating and drinking restrictions Follow instructions from your health care provider about eating and drinking, which may include:  8 hours before the procedure - stop eating heavy meals or foods such as meat, fried foods, or fatty foods.  6 hours before the procedure - stop eating light meals or foods, such as toast or cereal.  6 hours before the procedure - stop drinking milk or drinks that contain milk.  2 hours before the procedure - stop drinking clear liquids.  Medicines Ask your health care provider about:  Changing or stopping your regular medicines. This is especially important if you are taking diabetes medicines or blood thinners.  Taking medicines such as aspirin and ibuprofen. These medicines can thin your blood. Do not take these medicines before your procedure if your health care provider instructs you not to.  Tests and exams  You will have a physical exam.  You may have blood tests done to show: ? How well your kidneys and liver are working. ? How well your blood can clot.  General instructions  Plan to have someone take you home from the hospital or clinic.  If you will be going home right after the procedure,  plan to have someone with you for 24 hours.  What happens during the procedure?  Your blood pressure, heart rate, breathing, level of pain and overall condition will be monitored.  An IV tube will be inserted into one of your veins.  Your anesthesia specialist will give you medicines as needed to keep you comfortable during the procedure. This may mean changing the level of sedation.  The procedure will be  performed. After the procedure  Your blood pressure, heart rate, breathing rate, and blood oxygen level will be monitored until the medicines you were given have worn off.  Do not drive for 24 hours if you received a sedative.  You may: ? Feel sleepy, clumsy, or nauseous. ? Feel forgetful about what happened after the procedure. ? Have a sore throat if you had a breathing tube during the procedure. ? Vomit. This information is not intended to replace advice given to you by your health care provider. Make sure you discuss any questions you have with your health care provider. Document Released: 12/12/2004 Document Revised: 08/25/2015 Document Reviewed: 07/09/2015 Elsevier Interactive Patient Education  2018 [employer], Care After These instructions provide you with information about caring for yourself after your procedure. Your health care provider may also give you more specific instructions. Your treatment has been planned according to current medical practices, but problems sometimes occur. Call your health care provider if you have any problems or questions after your procedure. What can I expect after the procedure? After your procedure, it is common to:  Feel sleepy for several hours.  Feel clumsy and have poor balance for several hours.  Feel forgetful about what happened after the procedure.  Have poor judgment for several hours.  Feel nauseous or vomit.  Have a sore throat if you had a breathing tube during the procedure.  Follow these instructions at home: For at least 24 hours after the procedure:   Do not: ? Participate in activities in which you could fall or become injured. ? Drive. ? Use heavy machinery. ? Drink alcohol. ? Take sleeping pills or medicines that cause drowsiness. ? Make important decisions or sign legal documents. ? Take care of children on your own.  Rest. Eating and drinking  Follow the diet that is  recommended by your health care provider.  If you vomit, drink water, juice, or soup when you can drink without vomiting.  Make sure you have little or no nausea before eating solid foods. General instructions  Have a responsible adult stay with you until you are awake and alert.  Take over-the-counter and prescription medicines only as told by your health care provider.  If you smoke, do not smoke without supervision.  Keep all follow-up visits as told by your health care provider. This is important. Contact a health care provider if:  You keep feeling nauseous or you keep vomiting.  You feel light-headed.  You develop a rash.  You have a fever. Get help right away if:  You have trouble breathing. This information is not intended to replace advice given to you by your health care provider. Make sure you discuss any questions you have with your health care provider. Document Released: 07/09/2015 Document Revised: 11/08/2015 Document Reviewed: 07/09/2015 Elsevier Interactive Patient Education  [employer].

## 2018-02-23 DIAGNOSIS — E119 Type 2 diabetes mellitus without complications: Secondary | ICD-10-CM | POA: Diagnosis not present

## 2018-02-23 DIAGNOSIS — R300 Dysuria: Secondary | ICD-10-CM | POA: Diagnosis not present

## 2018-02-23 DIAGNOSIS — N390 Urinary tract infection, site not specified: Secondary | ICD-10-CM | POA: Diagnosis not present

## 2018-03-02 ENCOUNTER — Encounter (HOSPITAL_COMMUNITY)
Admission: RE | Admit: 2018-03-02 | Discharge: 2018-03-02 | Disposition: A | Discharge status: Home / Self Care | Payer: Medicare Other | Source: Ambulatory Visit | Attending: Internal Medicine | Admitting: Internal Medicine

## 2018-03-02 ENCOUNTER — Other Ambulatory Visit: Payer: Self-pay

## 2018-03-02 ENCOUNTER — Encounter (HOSPITAL_COMMUNITY): Payer: Self-pay

## 2018-03-02 DIAGNOSIS — R195 Other fecal abnormalities: Secondary | ICD-10-CM | POA: Diagnosis present

## 2018-03-02 DIAGNOSIS — F17210 Nicotine dependence, cigarettes, uncomplicated: Secondary | ICD-10-CM | POA: Diagnosis not present

## 2018-03-02 DIAGNOSIS — E785 Hyperlipidemia, unspecified: Secondary | ICD-10-CM | POA: Diagnosis not present

## 2018-03-02 DIAGNOSIS — Z7982 Long term (current) use of aspirin: Secondary | ICD-10-CM | POA: Diagnosis not present

## 2018-03-02 DIAGNOSIS — Z87442 Personal history of urinary calculi: Secondary | ICD-10-CM | POA: Diagnosis not present

## 2018-03-02 DIAGNOSIS — K210 Gastro-esophageal reflux disease with esophagitis: Secondary | ICD-10-CM | POA: Diagnosis not present

## 2018-03-02 DIAGNOSIS — G4730 Sleep apnea, unspecified: Secondary | ICD-10-CM | POA: Diagnosis not present

## 2018-03-02 DIAGNOSIS — K449 Diaphragmatic hernia without obstruction or gangrene: Secondary | ICD-10-CM | POA: Diagnosis not present

## 2018-03-02 DIAGNOSIS — K3189 Other diseases of stomach and duodenum: Secondary | ICD-10-CM | POA: Diagnosis not present

## 2018-03-02 DIAGNOSIS — D123 Benign neoplasm of transverse colon: Secondary | ICD-10-CM | POA: Diagnosis not present

## 2018-03-02 DIAGNOSIS — E119 Type 2 diabetes mellitus without complications: Secondary | ICD-10-CM | POA: Diagnosis not present

## 2018-03-02 DIAGNOSIS — Z8673 Personal history of transient ischemic attack (TIA), and cerebral infarction without residual deficits: Secondary | ICD-10-CM | POA: Diagnosis not present

## 2018-03-02 DIAGNOSIS — E559 Vitamin D deficiency, unspecified: Secondary | ICD-10-CM | POA: Diagnosis not present

## 2018-03-02 DIAGNOSIS — Z79899 Other long term (current) drug therapy: Secondary | ICD-10-CM | POA: Diagnosis not present

## 2018-03-02 DIAGNOSIS — K621 Rectal polyp: Secondary | ICD-10-CM | POA: Diagnosis not present

## 2018-03-02 DIAGNOSIS — K5730 Diverticulosis of large intestine without perforation or abscess without bleeding: Secondary | ICD-10-CM | POA: Diagnosis not present

## 2018-03-02 DIAGNOSIS — Z01818 Encounter for other preprocedural examination: Secondary | ICD-10-CM | POA: Insufficient documentation

## 2018-03-02 DIAGNOSIS — I10 Essential (primary) hypertension: Secondary | ICD-10-CM | POA: Diagnosis not present

## 2018-03-02 DIAGNOSIS — M1990 Unspecified osteoarthritis, unspecified site: Secondary | ICD-10-CM | POA: Diagnosis not present

## 2018-03-02 HISTORY — DX: Personal history of urinary calculi: Z87.442

## 2018-03-02 HISTORY — DX: Type 2 diabetes mellitus without complications: E11.9

## 2018-03-02 HISTORY — DX: Cerebral infarction, unspecified: I63.9

## 2018-03-02 HISTORY — DX: Sleep apnea, unspecified: G47.30

## 2018-03-02 LAB — BASIC METABOLIC PANEL
Anion gap: 7 (ref 5–15)
BUN: 12 mg/dL (ref 6–20)
CALCIUM: 9 mg/dL (ref 8.9–10.3)
CO2: 28 mmol/L (ref 22–32)
CREATININE: 0.88 mg/dL (ref 0.44–1.00)
Chloride: 104 mmol/L (ref 98–111)
GFR calc Af Amer: >60 mL/min (ref >60)
Glucose, Bld: 134 mg/dL — ABNORMAL HIGH (ref 70–99)
Potassium: 5.1 mmol/L (ref 3.5–5.1)
Sodium: 139 mmol/L (ref 135–145)

## 2018-03-02 LAB — CBC WITH DIFFERENTIAL/PLATELET
Abs Immature Granulocytes: 0.02 10*3/uL (ref 0.00–0.07)
BASOS PCT: 1 %
Basophils Absolute: 0.1 10*3/uL (ref 0.0–0.1)
EOS ABS: 0.7 10*3/uL — AB (ref 0.0–0.5)
EOS PCT: 8 %
HEMATOCRIT: 48.9 % — AB (ref 36.0–46.0)
Hemoglobin: 15.5 g/dL — ABNORMAL HIGH (ref 12.0–15.0)
Immature Granulocytes: 0 %
LYMPHS ABS: 2.4 10*3/uL (ref 0.7–4.0)
Lymphocytes Relative: 29 %
MCH: 29.2 pg (ref 26.0–34.0)
MCHC: 31.7 g/dL (ref 30.0–36.0)
MCV: 92.3 fL (ref 80.0–100.0)
Monocytes Absolute: 0.8 10*3/uL (ref 0.1–1.0)
Monocytes Relative: 9 %
NRBC: 0 % (ref 0.0–0.2)
Neutro Abs: 4.4 10*3/uL (ref 1.7–7.7)
Neutrophils Relative %: 53 %
PLATELETS: 255 10*3/uL (ref 150–400)
RBC: 5.3 MIL/uL — ABNORMAL HIGH (ref 3.87–5.11)
RDW: 13.6 % (ref 11.5–15.5)
WBC: 8.3 10*3/uL (ref 4.0–10.5)

## 2018-03-05 ENCOUNTER — Ambulatory Visit (HOSPITAL_COMMUNITY): Payer: Medicare Other | Admitting: Anesthesiology

## 2018-03-05 ENCOUNTER — Encounter (HOSPITAL_COMMUNITY)
Admission: RE | Disposition: A | Discharge status: Home / Self Care | Payer: Self-pay | Source: Ambulatory Visit | Attending: Internal Medicine

## 2018-03-05 ENCOUNTER — Ambulatory Visit (HOSPITAL_COMMUNITY)
Admission: RE | Admit: 2018-03-05 | Discharge: 2018-03-05 | Disposition: A | Discharge status: Home / Self Care | Payer: Medicare Other | Source: Ambulatory Visit | Attending: Internal Medicine | Admitting: Internal Medicine

## 2018-03-05 ENCOUNTER — Encounter (HOSPITAL_COMMUNITY): Payer: Self-pay | Admitting: *Deleted

## 2018-03-05 DIAGNOSIS — D124 Benign neoplasm of descending colon: Secondary | ICD-10-CM

## 2018-03-05 DIAGNOSIS — Z87442 Personal history of urinary calculi: Secondary | ICD-10-CM | POA: Insufficient documentation

## 2018-03-05 DIAGNOSIS — I10 Essential (primary) hypertension: Secondary | ICD-10-CM | POA: Diagnosis not present

## 2018-03-05 DIAGNOSIS — E785 Hyperlipidemia, unspecified: Secondary | ICD-10-CM | POA: Insufficient documentation

## 2018-03-05 DIAGNOSIS — D123 Benign neoplasm of transverse colon: Secondary | ICD-10-CM | POA: Diagnosis not present

## 2018-03-05 DIAGNOSIS — K210 Gastro-esophageal reflux disease with esophagitis: Secondary | ICD-10-CM | POA: Diagnosis not present

## 2018-03-05 DIAGNOSIS — F17210 Nicotine dependence, cigarettes, uncomplicated: Secondary | ICD-10-CM | POA: Insufficient documentation

## 2018-03-05 DIAGNOSIS — Z7982 Long term (current) use of aspirin: Secondary | ICD-10-CM | POA: Insufficient documentation

## 2018-03-05 DIAGNOSIS — K621 Rectal polyp: Secondary | ICD-10-CM | POA: Diagnosis not present

## 2018-03-05 DIAGNOSIS — M1990 Unspecified osteoarthritis, unspecified site: Secondary | ICD-10-CM | POA: Insufficient documentation

## 2018-03-05 DIAGNOSIS — K3189 Other diseases of stomach and duodenum: Secondary | ICD-10-CM | POA: Diagnosis not present

## 2018-03-05 DIAGNOSIS — G4730 Sleep apnea, unspecified: Secondary | ICD-10-CM | POA: Insufficient documentation

## 2018-03-05 DIAGNOSIS — K5730 Diverticulosis of large intestine without perforation or abscess without bleeding: Secondary | ICD-10-CM

## 2018-03-05 DIAGNOSIS — K219 Gastro-esophageal reflux disease without esophagitis: Secondary | ICD-10-CM

## 2018-03-05 DIAGNOSIS — K449 Diaphragmatic hernia without obstruction or gangrene: Secondary | ICD-10-CM | POA: Diagnosis not present

## 2018-03-05 DIAGNOSIS — R1013 Epigastric pain: Secondary | ICD-10-CM

## 2018-03-05 DIAGNOSIS — K209 Esophagitis, unspecified: Secondary | ICD-10-CM

## 2018-03-05 DIAGNOSIS — E119 Type 2 diabetes mellitus without complications: Secondary | ICD-10-CM | POA: Diagnosis not present

## 2018-03-05 DIAGNOSIS — E559 Vitamin D deficiency, unspecified: Secondary | ICD-10-CM | POA: Insufficient documentation

## 2018-03-05 DIAGNOSIS — Z79899 Other long term (current) drug therapy: Secondary | ICD-10-CM | POA: Insufficient documentation

## 2018-03-05 DIAGNOSIS — Z8673 Personal history of transient ischemic attack (TIA), and cerebral infarction without residual deficits: Secondary | ICD-10-CM | POA: Insufficient documentation

## 2018-03-05 DIAGNOSIS — R195 Other fecal abnormalities: Secondary | ICD-10-CM

## 2018-03-05 HISTORY — PX: ESOPHAGOGASTRODUODENOSCOPY (EGD) WITH PROPOFOL: SHX5813

## 2018-03-05 HISTORY — PX: BIOPSY: SHX5522

## 2018-03-05 HISTORY — PX: COLONOSCOPY WITH PROPOFOL: SHX5780

## 2018-03-05 HISTORY — PX: POLYPECTOMY: SHX5525

## 2018-03-05 LAB — GLUCOSE, CAPILLARY: Glucose-Capillary: 132 mg/dL — ABNORMAL HIGH (ref 70–99)

## 2018-03-05 SURGERY — COLONOSCOPY WITH PROPOFOL
Anesthesia: Monitor Anesthesia Care

## 2018-03-05 MED ORDER — PROPOFOL 500 MG/50ML IV EMUL
INTRAVENOUS | Status: DC | PRN
  Administered 2018-03-05: 125 ug/kg/min via INTRAVENOUS
  Administered 2018-03-05: 08:00:00 via INTRAVENOUS

## 2018-03-05 MED ORDER — MEPERIDINE HCL 100 MG/ML IJ SOLN: 6.2500 mg | INTRAMUSCULAR | Status: DC | PRN

## 2018-03-05 MED ORDER — PROPOFOL 10 MG/ML IV BOLUS
INTRAVENOUS | Status: DC | PRN
  Administered 2018-03-05: 20 mg via INTRAVENOUS

## 2018-03-05 MED ORDER — CHLORHEXIDINE GLUCONATE CLOTH 2 % EX PADS: 6.0000 | MEDICATED_PAD | Freq: Once | CUTANEOUS | Status: DC

## 2018-03-05 MED ORDER — PROMETHAZINE HCL 25 MG/ML IJ SOLN: 6.2500 mg | INTRAMUSCULAR | Status: DC | PRN

## 2018-03-05 MED ORDER — MIDAZOLAM HCL 5 MG/5ML IJ SOLN
INTRAMUSCULAR | Status: DC | PRN
  Administered 2018-03-05: 2 mg via INTRAVENOUS

## 2018-03-05 MED ORDER — LACTATED RINGERS IV SOLN
INTRAVENOUS | Status: DC
  Administered 2018-03-05: 1000 mL via INTRAVENOUS

## 2018-03-05 MED ORDER — PROPOFOL 10 MG/ML IV BOLUS
INTRAVENOUS | Status: AC
  Filled 2018-03-05: qty 60

## 2018-03-05 MED ORDER — HYDROMORPHONE HCL 1 MG/ML IJ SOLN: 0.2500 mg | INTRAMUSCULAR | Status: DC | PRN

## 2018-03-05 MED ORDER — LACTATED RINGERS IV SOLN: INTRAVENOUS | Status: DC

## 2018-03-05 MED ORDER — HYDROCODONE-ACETAMINOPHEN 7.5-325 MG PO TABS: 1.0000 | ORAL_TABLET | Freq: Once | ORAL | Status: DC | PRN

## 2018-03-05 MED ORDER — MIDAZOLAM HCL 2 MG/2ML IJ SOLN
INTRAMUSCULAR | Status: AC
  Filled 2018-03-05: qty 2

## 2018-03-05 MED ORDER — STERILE WATER FOR IRRIGATION IR SOLN
Status: DC | PRN
  Administered 2018-03-05: 1.5 mL

## 2018-03-05 MED ORDER — LIDOCAINE HCL 1 % IJ SOLN
INTRAMUSCULAR | Status: DC | PRN
  Administered 2018-03-05: 50 mg via INTRADERMAL

## 2018-03-05 NOTE — H&P (Signed)
_0 @   Primary Care Physician:  Rosita Fire, MD Primary Gastroenterologist:  Dr. Gala Romney  Pre-Procedure History & Physical: HPI:  Wendy Kramer is a 57 y.o. female here positive Cologuard; epigastric pain/reflux symptoms.  Denies dysphagia.iDenies dysphagia.  Past Medical History:  Diagnosis Date  . Arthritis   . Cardiac murmur   . Cervical spine disease   . Diabetes mellitus without complication (HCC)    diet controlled  . Exogenous hyperlipidemia   . History of kidney stones   . HTN (hypertension)   . Morbid obesity due to excess calories ([place])   . Other chronic pain   . Panic disorder   . Patent foramen ovale    TEE 2018  . Prediabetes   . Sleep apnea   . Stroke [facility])    "mini-stroke" no deficits from this.  Marland Kitchen Unspecified asthma with (acute) exacerbation   . Vitamin D deficiency     Past Surgical History:  Procedure Laterality Date  . CHOLECYSTECTOMY    . HERNIA REPAIR     umbilical  . SHOULDER ARTHROSCOPY Right   . TEE WITHOUT CARDIOVERSION  2018   patent foramen ovale  . TONSILLECTOMY AND ADENOIDECTOMY    . TUBAL LIGATION      Prior to Admission medications   Medication Sig Start Date End Date Taking? Authorizing Provider  albuterol (PROVENTIL HFA;VENTOLIN HFA) 108 (90 Base) MCG/ACT inhaler Inhale 1-2 puffs into the lungs every 6 (six) hours as needed for wheezing or shortness of breath.   Yes [provider]  ALPRAZolam Duanne Moron) 0.5 MG tablet Take 0.25 mg by mouth daily as needed for anxiety.    Yes [provider]  budesonide-formoterol (SYMBICORT) 80-4.5 MCG/ACT inhaler Inhale 1 puff into the lungs 2 (two) times daily.    Yes [provider]  Cholecalciferol (VITAMIN D3) 50 MCG (2000 UT) TABS Take 2,000 Units by mouth daily.    Yes [provider]  clopidogrel (PLAVIX) 75 MG tablet Take 1 tablet (75 mg total) by mouth daily. 06/02/17  Yes Milton Ferguson, MD  cyclobenzaprine (FLEXERIL) 5 MG tablet Take 5 mg by mouth daily  as needed for muscle spasms.  08/18/17  Yes [provider]  fluticasone (FLONASE) 50 MCG/ACT nasal spray Place 1 spray into both nostrils daily as needed for allergies or rhinitis.    Yes [provider]  ibuprofen (ADVIL,MOTRIN) 200 MG tablet Take 600 mg by mouth daily as needed for moderate pain.   Yes [provider]  lisinopril (PRINIVIL,ZESTRIL) 20 MG tablet Take 10 mg by mouth at bedtime.    Yes [provider]  Na Sulfate-K Sulfate-Mg Sulf 17.5-3.13-1.6 GM/177ML SOLN Take 1 kit by mouth as directed. 12/24/17  Yes Eusebia Grulke, Cristopher Estimable, MD  pantoprazole (PROTONIX) 40 MG tablet Take 1 tablet (40 mg total) by mouth daily before breakfast. 12/24/17  Yes Mahala Menghini, PA-C  traMADol (ULTRAM) 50 MG tablet Take 50 mg by mouth every 6 (six) hours as needed for moderate pain.  09/20/16  Yes [provider]    Allergies as of 12/24/2017 - Review Complete 12/24/2017  Allergen Reaction Noted  . Asa [aspirin] Anaphylaxis 09/24/2016  . Augmentin [amoxicillin-pot clavulanate] Other (See Comments) 09/24/2016  . Levaquin [levofloxacin in d5w] Itching and Other (See Comments) 09/24/2016  . Lovenox [enoxaparin sodium] Other (See Comments) 09/24/2016    Family History  Problem Relation Age of Onset  . Breast cancer Maternal Aunt   . Colon cancer Other  maternal great grandfather    Social History   Socioeconomic History  . Marital status: Legally Separated    Spouse name: Not on file  . Number of children: Not on file  . Years of education: Not on file  . Highest education level: Not on file  Occupational History  . Not on file  Social Needs  . Financial resource strain: Not on file  . Food insecurity:    Worry: Not on file    Inability: Not on file  . Transportation needs:    Medical: Not on file    Non-medical: Not on file  Tobacco Use  . Smoking status: Current Every Day Smoker    Packs/day: 0.50    Years: 40.00    Pack years: 20.00     Types: Cigarettes  . Smokeless tobacco: Never Used  Substance and Sexual Activity  . Alcohol use: No  . Drug use: No  . Sexual activity: Not Currently    Birth control/protection: Post-menopausal  Lifestyle  . Physical activity:    Days per week: Not on file    Minutes per session: Not on file  . Stress: Not on file  Relationships  . Social connections:    Talks on phone: Not on file    Gets together: Not on file    Attends religious service: Not on file    Active member of club or organization: Not on file    Attends meetings of clubs or organizations: Not on file    Relationship status: Not on file  . Intimate partner violence:    Fear of current or ex partner: Not on file    Emotionally abused: Not on file    Physically abused: Not on file    Forced sexual activity: Not on file  Other Topics Concern  . Not on file  Social History Narrative  . Not on file    Review of Systems: See HPI, otherwise negative ROS  Physical Exam: BP (!) 168/87   Temp 98 F (36.7 C) (Oral)   Resp (!) 22   LMP 09/06/2016   SpO2 95%  General:   Alert,  Well-developed, well-nourished, pleasant and cooperative in NAD Mouth:  No deformity or lesions. Neck:  Supple; no masses or thyromegaly. No significant cervical adenopathy. Lungs:  Clear throughout to auscultation.   No wheezes, crackles, or rhonchi. No acute distress. Heart:  Regular rate and rhythm; no murmurs, clicks, rubs,  or gallops. Abdomen: Non-distended, normal bowel sounds.  Soft and nontender without appreciable mass or hepatosplenomegaly.  Pulses:  Normal pulses noted. Extremities:  Without clubbing or edema.  Impression/Plan: 57 year old lady with a positive fecal DNA test and epigastric pain/reflux.  No dysphagia.  Here for EGD and colonoscopy.  The risks, benefits, limitations, imponderables and alternatives regarding both EGD and colonoscopy have been reviewed with the patient. Questions have been answered. All parties  agreeable.     Notice: This dictation was prepared with Dragon dictation along with smaller phrase technology. Any transcriptional errors that result from this process are unintentional and may not be corrected upon review.

## 2018-03-05 NOTE — Op Note (Signed)
[facility] Patient Name: Wendy Kramer Procedure Date: 03/05/2018 7:22 AM MRN: [id number] Date of Birth: 17-Dec-1960 Attending MD: Norvel Richards , MD CSN: [id number] Age: 57 Admit Type: Outpatient Procedure:                Upper GI endoscopy Indications:              Epigastric abdominal pain Providers:                Norvel Richards, MD, Lurline Del, RN, Rosina Lowenstein, RN Referring MD:             Rosita Fire MD, MD Medicines:                Propofol per Anesthesia Complications:            No immediate complications. Estimated Blood Loss:     Estimated blood loss was minimal. Procedure:                Pre-Anesthesia Assessment:                           - Prior to the procedure, a History and Physical                            was performed, and patient medications and                            allergies were reviewed. The patient's tolerance of                            previous anesthesia was also reviewed. The risks                            and benefits of the procedure and the sedation                            options and risks were discussed with the patient.                            All questions were answered, and informed consent                            was obtained. Prior Anticoagulants: The patient has                            taken no previous anticoagulant or antiplatelet                            agents. ASA Grade Assessment: II - A patient with                            mild systemic disease. After reviewing the risks  and benefits, the patient was deemed in                            satisfactory condition to undergo the procedure.                           After obtaining informed consent, the endoscope was                            passed under direct vision. Throughout the                            procedure, the patient's blood pressure, pulse, and   oxygen saturations were monitored continuously. The                            [id number]-H190 ([id number]) scope was introduced through the                            and advanced to the second part of duodenum. The                            upper GI endoscopy was accomplished without                            difficulty. The patient tolerated the procedure                            well. Scope In: 2:20:25 AM Scope Out: 7:53:57 AM Total Procedure Duration: 0 hours 7 minutes 14 seconds  Findings:      Esophagitis was found. Multiple erosions within 5 mm of the GE junction.       No Barrett's epithelium seen.      A small hiatal hernia was present.      Multiple erosions and antral mucosal edema. Did not see an overt       ulceration after careful survey. No infiltrating process seen. This was       biopsied with a cold forceps for histology. Estimated blood loss was       minimal.      The duodenal bulb and second portion of the duodenum were normal. Impression:               -Erosive reflux esophagitis.                           - Small hiatal hernia.                           - Erosive gastropathy. Biopsied.                           - Normal duodenal bulb and second portion of the                            duodenum. Moderate Sedation:      Moderate (conscious) sedation was personally administered by an  anesthesia professional. The following parameters were monitored: oxygen       saturation, heart rate, blood pressure, and response to care. Recommendation:           - Patient has a contact number available for                            emergencies. The signs and symptoms of potential                            delayed complications were discussed with the                            patient. Return to normal activities tomorrow.                            Written discharge instructions were provided to the                            patient.                           - Advance  diet as tolerated.                           - Continue present medications. Begin AcipHex 20 mg                            daily. See colonoscopy report.                           - No repeat upper endoscopy. Follow-up on pathology.                           - Return to GI office in 3 months. Procedure Code(s):        --- Professional ---                           [id number], Esophagogastroduodenoscopy, flexible,                            transoral; with biopsy, single or multiple Diagnosis Code(s):        --- Professional ---                           K20.9, Esophagitis, unspecified                           K44.9, Diaphragmatic hernia without obstruction or                            gangrene                           K31.89, Other diseases of stomach and duodenum  R10.13, Epigastric pain CPT copyright 2018 American Medical Association. All rights reserved. The codes documented in this report are preliminary and upon coder review may  be revised to meet current compliance requirements. Cristopher Estimable. Jazmene Racz, MD Norvel Richards, MD 03/05/2018 8:23:24 AM This report has been signed electronically. Number of Addenda: 0

## 2018-03-05 NOTE — Op Note (Signed)
[facility] Patient Name: Wendy Kramer Procedure Date: 03/05/2018 7:56 AM MRN: [id number] Date of Birth: 1961/03/15 Attending MD: Norvel Richards , MD CSN: [id number] Age: 57 Admit Type: Outpatient Procedure:                Colonoscopy Indications:              positive Cologuard Providers:                Norvel Richards, MD, Lurline Del, RN, Rosina Lowenstein, RN Referring MD:             Rosita Fire MD, MD Medicines:                Propofol per Anesthesia Complications:            No immediate complications. Estimated Blood Loss:      Procedure:                Pre-Anesthesia Assessment:                           - Prior to the procedure, a History and Physical                            was performed, and patient medications and                            allergies were reviewed. The patient's tolerance of                            previous anesthesia was also reviewed. The risks                            and benefits of the procedure and the sedation                            options and risks were discussed with the patient.                            All questions were answered, and informed consent                            was obtained. Prior Anticoagulants: The patient has                            taken no previous anticoagulant or antiplatelet                            agents. ASA Grade Assessment: II - A patient with                            mild systemic disease. After reviewing the risks  and benefits, the patient was deemed in                            satisfactory condition to undergo the procedure.                           After obtaining informed consent, the colonoscope                            was passed under direct vision. Throughout the                            procedure, the patient's blood pressure, pulse, and                            oxygen saturations were monitored  continuously. The                            CF-HQ190L ([id number]) scope was introduced through                            the and advanced to the the cecum, identified by                            appendiceal orifice and ileocecal valve. The                            colonoscopy was performed without difficulty. The                            patient tolerated the procedure well. The quality                            of the bowel preparation was adequate. Scope In: 7:59:55 AM Scope Out: 8:19:11 AM Scope Withdrawal Time: 0 hours 10 minutes 48 seconds  Total Procedure Duration: 0 hours 19 minutes 16 seconds  Findings:      The perianal and digital rectal examinations were normal.      Multiple medium-mouthed diverticula were found in the sigmoid colon and       descending colon.      Two sessile polyps were found in the rectum and descending colon. The       polyps were 4 to 6 mm in size. These polyps were removed with a cold       snare. Resection and retrieval were complete. Estimated blood loss was       minimal.      A 13 mm polyp was found in the hepatic flexure. The polyp was sessile.       The polyp was removed with a hot snare. Resection and retrieval were       complete. Estimated blood loss: none.      The exam was otherwise without abnormality on direct and retroflexion       views. Impression:               - Diverticulosis in the sigmoid colon and in the  descending colon.                           - Two 4 to 6 mm polyps in the rectum and in the                            descending colon, removed with a cold snare.                            Resected and retrieved.                           - One 13 mm polyp at the hepatic flexure, removed                            with a hot snare. Resected and retrieved.                           - The examination was otherwise normal on direct                            and retroflexion views. Moderate  Sedation:      Moderate (conscious) sedation was personally administered by an       anesthesia professional. The following parameters were monitored: oxygen       saturation, heart rate, blood pressure, respiratory rate, EKG, adequacy       of pulmonary ventilation, and response to care. Recommendation:           - Patient has a contact number available for                            emergencies. The signs and symptoms of potential                            delayed complications were discussed with the                            patient. Return to normal activities tomorrow.                            Written discharge instructions were provided to the                            patient.                           - Advance diet as tolerated.                           - Continue present medications.                           - Repeat colonoscopy date to be determined after  pending pathology results are reviewed for                            surveillance based on pathology results.                           - Return to GI office in 3 months. Procedure Code(s):        --- Professional ---                           [id number], Colonoscopy, flexible; with removal of                            tumor(s), polyp(s), or other lesion(s) by snare                            technique Diagnosis Code(s):        --- Professional ---                           K62.1, Rectal polyp                           D12.4, Benign neoplasm of descending colon                           D12.3, Benign neoplasm of transverse colon (hepatic                            flexure or splenic flexure)                           K57.30, Diverticulosis of large intestine without                            perforation or abscess without bleeding CPT copyright 2018 American Medical Association. All rights reserved. The codes documented in this report are preliminary and upon coder review may  be revised to  meet current compliance requirements. Cristopher Estimable. Seerat Peaden, MD Norvel Richards, MD 03/05/2018 8:27:25 AM This report has been signed electronically. Number of Addenda: 0

## 2018-03-05 NOTE — Transfer of Care (Signed)
Immediate Anesthesia Transfer of Care Note  Patient: Wendy Kramer  Procedure(s) Performed: COLONOSCOPY WITH PROPOFOL (N/A ) ESOPHAGOGASTRODUODENOSCOPY (EGD) WITH PROPOFOL (N/A ) BIOPSY POLYPECTOMY  Patient Location: PACU  Anesthesia Type:MAC  Level of Consciousness: awake  Airway & Oxygen Therapy: Patient Spontanous Breathing  Post-op Assessment: Report given to RN  Post vital signs: Reviewed and stable  Last Vitals:  Vitals Value Taken Time  BP 123/72 03/05/2018  8:26 AM  Temp    Pulse 75 03/05/2018  8:29 AM  Resp 21 03/05/2018  8:29 AM  SpO2 95 % 03/05/2018  8:29 AM  Vitals shown include unvalidated device data.  Last Pain:  Vitals:   03/05/18 0644  TempSrc: Oral  PainSc: 0-No pain      Patients Stated Pain Goal: 7 (76/81/15 7262)  Complications: No apparent anesthesia complications

## 2018-03-05 NOTE — Anesthesia Postprocedure Evaluation (Signed)
Anesthesia Post Note  Patient: Wendy Kramer  Procedure(s) Performed: COLONOSCOPY WITH PROPOFOL (N/A ) ESOPHAGOGASTRODUODENOSCOPY (EGD) WITH PROPOFOL (N/A ) BIOPSY POLYPECTOMY  Patient location during evaluation: PACU Anesthesia Type: MAC Level of consciousness: awake and alert and oriented Pain management: pain level controlled Vital Signs Assessment: post-procedure vital signs reviewed and stable Respiratory status: spontaneous breathing Cardiovascular status: blood pressure returned to baseline Postop Assessment: no apparent nausea or vomiting Anesthetic complications: no     Last Vitals:  Vitals:   03/05/18 0644 03/05/18 0826  BP: (!) 168/87   Resp: (!) 22   Temp: 36.7 C 36.8 C  SpO2: 95%     Last Pain:  Vitals:   03/05/18 0644  TempSrc: Oral  PainSc: 0-No pain                 Emmarose Klinke

## 2018-03-05 NOTE — Anesthesia Preprocedure Evaluation (Signed)
Anesthesia Evaluation    Airway Mallampati: II       Dental  (+) Teeth Intact   Pulmonary asthma , sleep apnea , Current Smoker,     + decreased breath sounds      Cardiovascular hypertension, + Valvular Problems/Murmurs  Rhythm:regular     Neuro/Psych PSYCHIATRIC DISORDERS Anxiety CVA    GI/Hepatic GERD  ,  Endo/Other  diabetes, Type 2  Renal/GU      Musculoskeletal   Abdominal   Peds  Hematology   Anesthesia Other Findings Morbid obesity and physically deconditioned OSA, no CPAP "diet controlled DM  Reproductive/Obstetrics                             Anesthesia Physical Anesthesia Plan  ASA: IV  Anesthesia Plan: MAC   Post-op Pain Management:    Induction:   PONV Risk Score and Plan:   Airway Management Planned:   Additional Equipment:   Intra-op Plan:   Post-operative Plan:   Informed Consent:   Dental Advisory Given  Plan Discussed with: Anesthesiologist  Anesthesia Plan Comments:         Anesthesia Quick Evaluation

## 2018-03-06 NOTE — Discharge Instructions (Signed)
°Colonoscopy °Discharge Instructions ° °Read the instructions outlined below and refer to this sheet in the next few weeks. These discharge instructions provide you with general information on caring for yourself after you leave the hospital. Your doctor may also give you specific instructions. While your treatment has been planned according to the most current medical practices available, unavoidable complications occasionally occur. If you have any problems or questions after discharge, call Dr. [name] at [phone]. °ACTIVITY °· You may resume your regular activity, but move at a slower pace for the next 24 hours.  °· Take frequent rest periods for the next 24 hours.  °· Walking will help get rid of the air and reduce the bloated feeling in your belly (abdomen).  °· No driving for 24 hours (because of the medicine (anesthesia) used during the test).   °· Do not sign any important legal documents or operate any machinery for 24 hours (because of the anesthesia used during the test).  °NUTRITION °· Drink plenty of fluids.  °· You may resume your normal diet as instructed by your doctor.  °· Begin with a light meal and progress to your normal diet. Heavy or fried foods are harder to digest and may make you feel sick to your stomach (nauseated).  °· Avoid alcoholic beverages for 24 hours or as instructed.  °MEDICATIONS °· You may resume your normal medications unless your doctor tells you otherwise.  °WHAT YOU CAN EXPECT TODAY °· Some feelings of bloating in the abdomen.  °· Passage of more gas than usual.  °· Spotting of blood in your stool or on the toilet paper.  °IF YOU HAD POLYPS REMOVED DURING THE COLONOSCOPY: °· No aspirin products for 7 days or as instructed.  °· No alcohol for 7 days or as instructed.  °· Eat a soft diet for the next 24 hours.  °FINDING OUT THE RESULTS OF YOUR TEST °Not all test results are available during your visit. If your test results are not back during the visit, make an appointment  with your caregiver to find out the results. Do not assume everything is normal if you have not heard from your caregiver or the medical facility. It is important for you to follow up on all of your test results.  °SEEK IMMEDIATE MEDICAL ATTENTION IF: °· You have more than a spotting of blood in your stool.  °· Your belly is swollen (abdominal distention).  °· You are nauseated or vomiting.  °· You have a temperature over 101.  °· You have abdominal pain or discomfort that is severe or gets worse throughout the day.  °EGD °Discharge instructions °Please read the instructions outlined below and refer to this sheet in the next few weeks. These discharge instructions provide you with general information on caring for yourself after you leave the hospital. Your doctor may also give you specific instructions. While your treatment has been planned according to the most current medical practices available, unavoidable complications occasionally occur. If you have any problems or questions after discharge, please call your doctor. °ACTIVITY °· You may resume your regular activity but move at a slower pace for the next 24 hours.  °· Take frequent rest periods for the next 24 hours.  °· Walking will help expel (get rid of) the air and reduce the bloated feeling in your abdomen.  °· No driving for 24 hours (because of the anesthesia (medicine) used during the test).  °· You may shower.  °· Do not sign any important   legal documents or operate any machinery for 24 hours (because of the anesthesia used during the test).  NUTRITION  Drink plenty of fluids.   You may resume your normal diet.   Begin with a light meal and progress to your normal diet.   Avoid alcoholic beverages for 24 hours or as instructed by your caregiver.  MEDICATIONS  You may resume your normal medications unless your caregiver tells you otherwise.  WHAT YOU CAN EXPECT TODAY  You may experience abdominal discomfort such as a feeling of fullness  or gas pains.  FOLLOW-UP  Your doctor will discuss the results of your test with you.  SEEK IMMEDIATE MEDICAL ATTENTION IF ANY OF THE FOLLOWING OCCUR:  Excessive nausea (feeling sick to your stomach) and/or vomiting.   Severe abdominal pain and distention (swelling).   Trouble swallowing.   Temperature over 101 F (37.8 C).   Rectal bleeding or vomiting of blood.    GERD, diverticulosis and colon polyp information provided  Begin AcipHex 20 mg daily for GERD  Further recommendations to follow pending review of pathology report  Office visit with [place] in 3 months  ******Please call Dr. Roseanne Kaufman office at [phone] Friday or Monday  to schedule appointment for follow up.  Today the office computers to schedule are down.    Gastroesophageal Reflux Disease, Adult Normally, food travels down the esophagus and stays in the stomach to be digested. If a person has gastroesophageal reflux disease (GERD), food and stomach acid move back up into the esophagus. When this happens, the esophagus becomes sore and swollen (inflamed). Over time, GERD can make small holes (ulcers) in the lining of the esophagus. Follow these instructions at home: Diet  Follow a diet as told by your doctor. You may need to avoid foods and drinks such as: ? Coffee and tea (with or without caffeine). ? Drinks that contain alcohol. ? Energy drinks and sports drinks. ? Carbonated drinks or sodas. ? Chocolate and cocoa. ? Peppermint and mint flavorings. ? Garlic and onions. ? Horseradish. ? Spicy and acidic foods, such as peppers, chili powder, curry powder, vinegar, hot sauces, and BBQ sauce. ? Citrus fruit juices and citrus fruits, such as oranges, lemons, and limes. ? Tomato-based foods, such as red sauce, chili, salsa, and pizza with red sauce. ? Fried and fatty foods, such as donuts, french fries, potato chips, and high-fat dressings. ? High-fat meats, such as hot dogs, rib eye steak, sausage, ham, and  bacon. ? High-fat dairy items, such as whole milk, butter, and cream cheese.  Eat small meals often. Avoid eating large meals.  Avoid drinking large amounts of liquid with your meals.  Avoid eating meals during the 2-3 hours before bedtime.  Avoid lying down right after you eat.  Do not exercise right after you eat. General instructions  Pay attention to any changes in your symptoms.  Take over-the-counter and prescription medicines only as told by your doctor. Do not take aspirin, ibuprofen, or other NSAIDs unless your doctor says it is okay.  Do not use any tobacco products, including cigarettes, chewing tobacco, and e-cigarettes. If you need help quitting, ask your doctor.  Wear loose clothes. Do not wear anything tight around your waist.  Raise (elevate) the head of your bed about 6 inches (15 cm).  Try to lower your stress. If you need help doing this, ask your doctor.  If you are overweight, lose an amount of weight that is healthy for you. Ask your doctor about a  safe weight loss goal.  Keep all follow-up visits as told by your doctor. This is important. Contact a doctor if:  You have new symptoms.  You lose weight and you do not know why it is happening.  You have trouble swallowing, or it hurts to swallow.  You have wheezing or a cough that keeps happening.  Your symptoms do not get better with treatment.  You have a hoarse voice. Get help right away if:  You have pain in your arms, neck, jaw, teeth, or back.  You feel sweaty, dizzy, or light-headed.  You have chest pain or shortness of breath.  You throw up (vomit) and your throw up looks like blood or coffee grounds.  You pass out (faint).  Your poop (stool) is bloody or black.  You cannot swallow, drink, or eat. This information is not intended to replace advice given to you by your health care provider. Make sure you discuss any questions you have with your health care provider. Document  Released: 09/04/2007 Document Revised: 08/24/2015 Document Reviewed: 07/13/2014 Elsevier Interactive Patient Education  [employer].    Diverticulosis Diverticulosis is a condition that develops when small pouches (diverticula) form in the wall of the large intestine (colon). The colon is where water is absorbed and stool is formed. The pouches form when the inside layer of the colon pushes through weak spots in the outer layers of the colon. You may have a few pouches or many of them. What are the causes? The cause of this condition is not known. What increases the risk? The following factors may make you more likely to develop this condition:  Being older than age 54. Your risk for this condition increases with age. Diverticulosis is rare among people younger than age 55. By age 22, many people have it.  Eating a low-fiber diet.  Having frequent constipation.  Being overweight.  Not getting enough exercise.  Smoking.  Taking over-the-counter pain medicines, like aspirin and ibuprofen.  Having a family history of diverticulosis.  What are the signs or symptoms? In most people, there are no symptoms of this condition. If you do have symptoms, they may include:  Bloating.  Cramps in the abdomen.  Constipation or diarrhea.  Pain in the lower left side of the abdomen.  How is this diagnosed? This condition is most often diagnosed during an exam for other colon problems. Because diverticulosis usually has no symptoms, it often cannot be diagnosed independently. This condition may be diagnosed by:  Using a flexible scope to examine the colon (colonoscopy).  Taking an X-ray of the colon after dye has been put into the colon (barium enema).  Doing a CT scan.  How is this treated? You may not need treatment for this condition if you have never developed an infection related to diverticulosis. If you have had an infection before, treatment may include:  Eating a  high-fiber diet. This may include eating more fruits, vegetables, and grains.  Taking a fiber supplement.  Taking a live bacteria supplement (probiotic).  Taking medicine to relax your colon.  Taking antibiotic medicines.  Follow these instructions at home:  Drink 6-8 glasses of water or more each day to prevent constipation.  Try not to strain when you have a bowel movement.  If you have had an infection before: ? Eat more fiber as directed by your health care provider or your diet and nutrition specialist (dietitian). ? Take a fiber supplement or probiotic, if your health care provider  approves.  Take over-the-counter and prescription medicines only as told by your health care provider.  If you were prescribed an antibiotic, take it as told by your health care provider. Do not stop taking the antibiotic even if you start to feel better.  Keep all follow-up visits as told by your health care provider. This is important. Contact a health care provider if:  You have pain in your abdomen.  You have bloating.  You have cramps.  You have not had a bowel movement in 3 days. Get help right away if:  Your pain gets worse.  Your bloating becomes very bad.  You have a fever or chills, and your symptoms suddenly get worse.  You vomit.  You have bowel movements that are bloody or black.  You have bleeding from your rectum. Summary  Diverticulosis is a condition that develops when small pouches (diverticula) form in the wall of the large intestine (colon).  You may have a few pouches or many of them.  This condition is most often diagnosed during an exam for other colon problems.  If you have had an infection related to diverticulosis, treatment may include increasing the fiber in your diet, taking supplements, or taking medicines. This information is not intended to replace advice given to you by your health care provider. Make sure you discuss any questions you have with  your health care provider. Document Released: 12/14/2003 Document Revised: 02/05/2016 Document Reviewed: 02/05/2016 Elsevier Interactive Patient Education  2017 [place].   Colon Polyps Polyps are tissue growths inside the body. Polyps can grow in many places, including the large intestine (colon). A polyp may be a round bump or a mushroom-shaped growth. You could have one polyp or several. Most colon polyps are noncancerous (benign). However, some colon polyps can become cancerous over time. What are the causes? The exact cause of colon polyps is not known. What increases the risk? This condition is more likely to develop in people who:  Have a family history of colon cancer or colon polyps.  Are older than 39 or older than 45 if they are African American.  Have inflammatory bowel disease, such as ulcerative colitis or Crohn disease.  Are overweight.  Smoke cigarettes.  Do not get enough exercise.  Drink too much alcohol.  Eat a diet that is: ? High in fat and red meat. ? Low in fiber.  Had childhood cancer that was treated with abdominal radiation.  What are the signs or symptoms? Most polyps do not cause symptoms. If you have symptoms, they may include:  Blood coming from your rectum when having a bowel movement.  Blood in your stool.The stool may look dark red or black.  A change in bowel habits, such as constipation or diarrhea.  How is this diagnosed? This condition is diagnosed with a colonoscopy. This is a procedure that uses a lighted, flexible scope to look at the inside of your colon. How is this treated? Treatment for this condition involves removing any polyps that are found. Those polyps will then be tested for cancer. If cancer is found, your health care provider will talk to you about options for colon cancer treatment. Follow these instructions at home: Diet  Eat plenty of fiber, such as fruits, vegetables, and whole grains.  Eat foods that  are high in calcium and vitamin D, such as milk, cheese, yogurt, eggs, liver, fish, and broccoli.  Limit foods high in fat, red meats, and processed meats, such as hot dogs,  sausage, bacon, and lunch meats.  Maintain a healthy weight, or lose weight if recommended by your health care provider. General instructions  Do not smoke cigarettes.  Do not drink alcohol excessively.  Keep all follow-up visits as told by your health care provider. This is important. This includes keeping regularly scheduled colonoscopies. Talk to your health care provider about when you need a colonoscopy.  Exercise every day or as told by your health care provider. Contact a health care provider if:  You have new or worsening bleeding during a bowel movement.  You have new or increased blood in your stool.  You have a change in bowel habits.  You unexpectedly lose weight. This information is not intended to replace advice given to you by your health care provider. Make sure you discuss any questions you have with your health care provider. Document Released: 12/13/2003 Document Revised: 08/24/2015 Document Reviewed: 02/06/2015 Elsevier Interactive Patient Education  2018 [place], Care After These instructions provide you with information about caring for yourself after your procedure. Your health care provider may also give you more specific instructions. Your treatment has been planned according to current medical practices, but problems sometimes occur. Call your health care provider if you have any problems or questions after your procedure. What can I expect after the procedure? After your procedure, it is common to:  Feel sleepy for several hours.  Feel clumsy and have poor balance for several hours.  Feel forgetful about what happened after the procedure.  Have poor judgment for several hours.  Feel nauseous or vomit.  Have a sore throat if you had a  breathing tube during the procedure.  Follow these instructions at home: For at least 24 hours after the procedure:   Do not: ? Participate in activities in which you could fall or become injured. ? Drive. ? Use heavy machinery. ? Drink alcohol. ? Take sleeping pills or medicines that cause drowsiness. ? Make important decisions or sign legal documents. ? Take care of children on your own.  Rest. Eating and drinking  Follow the diet that is recommended by your health care provider.  If you vomit, drink water, juice, or soup when you can drink without vomiting.  Make sure you have little or no nausea before eating solid foods. General instructions  Have a responsible adult stay with you until you are awake and alert.  Take over-the-counter and prescription medicines only as told by your health care provider.  If you smoke, do not smoke without supervision.  Keep all follow-up visits as told by your health care provider. This is important. Contact a health care provider if:  You keep feeling nauseous or you keep vomiting.  You feel light-headed.  You develop a rash.  You have a fever. Get help right away if:  You have trouble breathing. This information is not intended to replace advice given to you by your health care provider. Make sure you discuss any questions you have with your health care provider. Document Released: 07/09/2015 Document Revised: 11/08/2015 Document Reviewed: 07/09/2015 Elsevier Interactive Patient Education  [employer].

## 2018-03-08 ENCOUNTER — Encounter: Payer: Self-pay | Admitting: Internal Medicine

## 2018-03-09 ENCOUNTER — Telehealth: Payer: Self-pay

## 2018-03-09 ENCOUNTER — Other Ambulatory Visit: Payer: Self-pay | Admitting: Internal Medicine

## 2018-03-09 DIAGNOSIS — I679 Cerebrovascular disease, unspecified: Secondary | ICD-10-CM | POA: Diagnosis not present

## 2018-03-09 DIAGNOSIS — I10 Essential (primary) hypertension: Secondary | ICD-10-CM | POA: Diagnosis not present

## 2018-03-09 DIAGNOSIS — M79601 Pain in right arm: Secondary | ICD-10-CM | POA: Diagnosis not present

## 2018-03-09 DIAGNOSIS — I6990 Unspecified sequelae of unspecified cerebrovascular disease: Secondary | ICD-10-CM | POA: Diagnosis not present

## 2018-03-09 NOTE — Telephone Encounter (Signed)
PA for Rabeprazole sodium 20 mg was submitted through [email or web address]. PA was approved, pt is aware and will contact her pharmacy. When approval letter is received, it will be scanned in chart.

## 2018-03-10 ENCOUNTER — Encounter (HOSPITAL_COMMUNITY): Payer: Self-pay | Admitting: Internal Medicine

## 2018-06-18 ENCOUNTER — Encounter: Payer: Self-pay | Admitting: Internal Medicine

## 2018-09-01 DIAGNOSIS — F17210 Nicotine dependence, cigarettes, uncomplicated: Secondary | ICD-10-CM | POA: Diagnosis not present

## 2018-09-01 DIAGNOSIS — M79601 Pain in right arm: Secondary | ICD-10-CM | POA: Diagnosis not present

## 2018-09-01 DIAGNOSIS — E119 Type 2 diabetes mellitus without complications: Secondary | ICD-10-CM | POA: Diagnosis not present

## 2018-09-01 DIAGNOSIS — I10 Essential (primary) hypertension: Secondary | ICD-10-CM | POA: Diagnosis not present

## 2018-09-01 DIAGNOSIS — Z1331 Encounter for screening for depression: Secondary | ICD-10-CM | POA: Diagnosis not present

## 2018-09-01 DIAGNOSIS — Z1389 Encounter for screening for other disorder: Secondary | ICD-10-CM | POA: Diagnosis not present

## 2018-09-01 DIAGNOSIS — I6990 Unspecified sequelae of unspecified cerebrovascular disease: Secondary | ICD-10-CM | POA: Diagnosis not present

## 2018-09-01 DIAGNOSIS — I679 Cerebrovascular disease, unspecified: Secondary | ICD-10-CM | POA: Diagnosis not present

## 2018-09-01 DIAGNOSIS — J4540 Moderate persistent asthma, uncomplicated: Secondary | ICD-10-CM | POA: Diagnosis not present

## 2018-09-03 DIAGNOSIS — I10 Essential (primary) hypertension: Secondary | ICD-10-CM | POA: Diagnosis not present

## 2018-09-03 DIAGNOSIS — E538 Deficiency of other specified B group vitamins: Secondary | ICD-10-CM | POA: Diagnosis not present

## 2018-09-03 DIAGNOSIS — E559 Vitamin D deficiency, unspecified: Secondary | ICD-10-CM | POA: Diagnosis not present

## 2018-09-03 DIAGNOSIS — E119 Type 2 diabetes mellitus without complications: Secondary | ICD-10-CM | POA: Diagnosis not present

## 2018-09-07 DIAGNOSIS — E1142 Type 2 diabetes mellitus with diabetic polyneuropathy: Secondary | ICD-10-CM | POA: Diagnosis not present

## 2018-10-07 DIAGNOSIS — E119 Type 2 diabetes mellitus without complications: Secondary | ICD-10-CM | POA: Diagnosis not present

## 2018-10-07 DIAGNOSIS — R011 Cardiac murmur, unspecified: Secondary | ICD-10-CM | POA: Diagnosis not present

## 2018-10-07 NOTE — Progress Notes (Signed)
Referring Provider: Rosita Fire, MD Primary Care Physician:  Rosita Fire, MD  Primary GI: Dr. Gala Romney  Chief Complaint  Patient presents with  . Follow-up    S/P tcs 03/2018   . Gastroesophageal Reflux    RABEPRAZOLE     HPI:   Wendy Kramer is a 58 y.o. female presenting today for follow-up.   Last seen in our office on 12/24/17 for positive cologard and epigastric pain after stopping Zantac. Was taking omeprazole, but symptoms were poorly controlled with epigastric pain and burning. PPI was switched to Protonix 40 mg and scheduled for colonoscopy and EGD. Also noted fatty liver on [place] in August 2019. Recommended following LFTs 1-2 times a year along with weight loss and limiting alcohol.   Colonoscopy 03/05/18: Diverticulosis in the sigmoid colon and in the descending colon. Two 4 to 6 mm polyps in the rectum and in the descending colon. One 13 mm polyp at the hepatic flexure. Pathology with 2 hyperplastic polyps and one sessile serrated polyp. No dysphasia or malignancy. Recommended repeat in 3 years.  EGD 03/05/18: Erosive reflux esophagitis. Small hiatal hernia. Erosive gastropathy. Normal duodenal bulb and second portion of the duodenum. Pathology with reactive gastropathy. Recommended starting Aciphex 20 mg daily.   Today she states she has no acid reflux on Aciphex. She has 8 teeth removed about 2.5 months ago and developed occasional muscle cramps.  History of magnesium and potassium deficiency. Spoke to PCP about this. Checked her potassium which was "normal."  Reports her diet had to change significantly after her teeth were pulled. She has recently started adding green leafy vegetables and fruits and getting her diet back to normal which has helped with the cramping.   Denies abdominal pain, n/v, dysphagia. BMs daily and normal for her. No constipation or diarrhea. No hematochezia or melena. She is trying to eat healthier due to being told she was diabetic. She has lost about  4 lbs.   Ibuprofen 600 mg once every 3-4 days. Trying not to take it.   Admits to Lightheadedness/dizziness randomly. PCP aware. Thinks may be related to sugar. Also recent BP medication change.  Patient also reporting allergies causing her to have some dizziness.   Denies fever, chills, chest pain, palpitations, shortness of breath, cough, wheezing, swelling in abdomen or lower extremities, confusion, and skin color changes.   Past Medical History:  Diagnosis Date  . Arthritis   . Cardiac murmur   . Cervical spine disease   . Diabetes mellitus without complication (HCC)    diet controlled  . Exogenous hyperlipidemia   . History of kidney stones   . HTN (hypertension)   . Morbid obesity due to excess calories ([place])   . Other chronic pain   . Panic disorder   . Patent foramen ovale    TEE 2018  . Prediabetes   . Sleep apnea   . Stroke [facility])    "mini-stroke" no deficits from this.  Marland Kitchen Unspecified asthma with (acute) exacerbation   . Vitamin D deficiency     Past Surgical History:  Procedure Laterality Date  . BIOPSY  03/05/2018   Procedure: BIOPSY;  Surgeon: Daneil Dolin, MD;  Location: [facility];  Service: Endoscopy;;  gastric  . CHOLECYSTECTOMY    . COLONOSCOPY WITH PROPOFOL N/A 03/05/2018   Procedure: COLONOSCOPY WITH PROPOFOL;  Surgeon: Daneil Dolin, MD;  Location: [facility];  Service: Endoscopy;  Laterality: N/A;  8:30am  . ESOPHAGOGASTRODUODENOSCOPY (EGD) WITH PROPOFOL N/A  03/05/2018   Procedure: ESOPHAGOGASTRODUODENOSCOPY (EGD) WITH PROPOFOL;  Surgeon: Daneil Dolin, MD;  Location: [facility];  Service: Endoscopy;  Laterality: N/A;  Patient has DDD of neck with spinal stenosis  . HERNIA REPAIR     umbilical  . POLYPECTOMY  03/05/2018   Procedure: POLYPECTOMY;  Surgeon: Daneil Dolin, MD;  Location: [facility];  Service: Endoscopy;;  colon  . SHOULDER ARTHROSCOPY Right   . TEE WITHOUT CARDIOVERSION  2018   patent foramen ovale  . TONSILLECTOMY AND  ADENOIDECTOMY    . TUBAL LIGATION      Current Outpatient Medications  Medication Sig Dispense Refill  . albuterol (PROVENTIL HFA;VENTOLIN HFA) 108 (90 Base) MCG/ACT inhaler Inhale 1-2 puffs into the lungs every 6 (six) hours as needed for wheezing or shortness of breath.    . ALPRAZolam (XANAX) 0.5 MG tablet Take 0.25 mg by mouth daily as needed for anxiety.     . budesonide-formoterol (SYMBICORT) 80-4.5 MCG/ACT inhaler Inhale 1 puff into the lungs 2 (two) times daily.     . Cholecalciferol (VITAMIN D3) 50 MCG (2000 UT) TABS Take 2,000 Units by mouth daily.     . clopidogrel (PLAVIX) 75 MG tablet Take 1 tablet (75 mg total) by mouth daily. 30 tablet 0  . cyclobenzaprine (FLEXERIL) 5 MG tablet Take 5 mg by mouth daily as needed for muscle spasms.   0  . fluticasone (FLONASE) 50 MCG/ACT nasal spray Place 1 spray into both nostrils daily as needed for allergies or rhinitis.     Marland Kitchen ibuprofen (ADVIL,MOTRIN) 200 MG tablet Take 600 mg by mouth daily as needed for moderate pain.    Marland Kitchen lisinopril-hydrochlorothiazide (ZESTORETIC) 20-12.5 MG tablet Take 1 tablet by mouth daily.    . RABEprazole (ACIPHEX) 20 MG tablet TAKE 1 TABLET BY MOUTH EVERY DAY 90 tablet 3  . traMADol (ULTRAM) 50 MG tablet Take 50 mg by mouth every 6 (six) hours as needed for moderate pain.   3  . lisinopril (PRINIVIL,ZESTRIL) 20 MG tablet Take 10 mg by mouth at bedtime.     . Na Sulfate-K Sulfate-Mg Sulf 17.5-3.13-1.6 GM/177ML SOLN Take 1 kit by mouth as directed. (Patient not taking: Reported on 10/08/2018) 1 Bottle 0  . pantoprazole (PROTONIX) 40 MG tablet Take 1 tablet (40 mg total) by mouth daily before breakfast. (Patient not taking: Reported on 10/08/2018) 90 tablet 3   No current facility-administered medications for this visit.     Allergies as of 10/08/2018 - Review Complete 10/08/2018  Allergen Reaction Noted  . Asa [aspirin] Anaphylaxis 09/24/2016  . Augmentin [amoxicillin-pot clavulanate] Other (See Comments) 09/24/2016   . Levaquin [levofloxacin in d5w] Itching and Other (See Comments) 09/24/2016  . Lovenox [enoxaparin sodium] Other (See Comments) 09/24/2016    Family History  Problem Relation Age of Onset  . Breast cancer Maternal Aunt   . Colon cancer Other        maternal great grandfather    Social History   Socioeconomic History  . Marital status: Legally Separated    Spouse name: Not on file  . Number of children: Not on file  . Years of education: Not on file  . Highest education level: Not on file  Occupational History  . Not on file  Social Needs  . Financial resource strain: Not on file  . Food insecurity    Worry: Not on file    Inability: Not on file  . Transportation needs    Medical: Not on file  Non-medical: Not on file  Tobacco Use  . Smoking status: Current Every Day Smoker    Packs/day: 0.50    Years: 40.00    Pack years: 20.00    Types: Cigarettes  . Smokeless tobacco: Never Used  Substance and Sexual Activity  . Alcohol use: No  . Drug use: No  . Sexual activity: Not Currently    Birth control/protection: Post-menopausal  Lifestyle  . Physical activity    Days per week: Not on file    Minutes per session: Not on file  . Stress: Not on file  Relationships  . Social Herbalist on phone: Not on file    Gets together: Not on file    Attends religious service: Not on file    Active member of club or organization: Not on file    Attends meetings of clubs or organizations: Not on file    Relationship status: Not on file  Other Topics Concern  . Not on file  Social History Narrative  . Not on file    Review of Systems: Gen: See HPI  CV: See HPI Resp: See HPI GI: See HPI Derm: Denies rash, itching, dry skin Psych: Denies depression, anxiety Heme: Denies bruising, bleeding  Physical Exam: BP (!) 150/81   Pulse 82   Temp (!) 97.4 F (36.3 C) (Oral)   Ht '5\' 4"'  (1.626 m)   Wt 250 lb 6.4 oz (113.6 kg)   LMP 09/06/2016   BMI 42.98 kg/m   General:   Alert and oriented. No distress noted. Pleasant and cooperative.  Head:  Normocephalic and atraumatic. Eyes:  Conjuctiva clear without scleral icterus. Heart:  S1, S2 present without murmurs appreciated. Lungs:  Clear to auscultation bilaterally. No wheezes, rales, or rhonchi. No distress.  Abdomen:  +BS, soft, non-tender and non-distended. No rebound or guarding. No HSM or masses noted. Msk:  Symmetrical without gross deformities. Normal posture. Extremities:  Without edema. Neurologic:  Alert and  oriented x4 Psych:  Alert and cooperative. Normal mood and affect.

## 2018-10-08 ENCOUNTER — Encounter: Payer: Self-pay | Admitting: Gastroenterology

## 2018-10-08 ENCOUNTER — Other Ambulatory Visit: Payer: Self-pay

## 2018-10-08 ENCOUNTER — Ambulatory Visit (INDEPENDENT_AMBULATORY_CARE_PROVIDER_SITE_OTHER): Payer: Medicare Other | Admitting: Gastroenterology

## 2018-10-08 VITALS — BP 150/81 | HR 82 | Temp 97.4°F | Ht 64.0 in | Wt 250.4 lb

## 2018-10-08 DIAGNOSIS — K760 Fatty (change of) liver, not elsewhere classified: Secondary | ICD-10-CM

## 2018-10-08 DIAGNOSIS — K219 Gastro-esophageal reflux disease without esophagitis: Secondary | ICD-10-CM | POA: Diagnosis not present

## 2018-10-08 NOTE — Patient Instructions (Signed)
1. Continue Rabeprazole daily 30 minutes before your first meal.   2. You may add a multivitamin to see if this helps with your occasional cramping.  3. I will request labs from your PCP. If they do not contain results for your liver function tests, we will go ahead and have you complete these.   4. We will see you back in 6 months. If you have concerns prior to your next visit, please call.   Aliene Altes, PA-C [facility]

## 2018-10-08 NOTE — Assessment & Plan Note (Addendum)
Fatty liver noted on [place] in August 2019. No signs or symptoms of advanced liver disease. Patient is trying to eat healthier and lose weight as she has been told she has diabetes. She has lost 4 lbs so far. Last LFTs in the system in 2019 were normal. Patient reports PCP recently checked labs, but not sure if LFTs were included. I will request records from PCP. If LFTs not included, we should check these as it has been over 1 year.   Continue weight loss efforts.  Follow low fat/low cholesterol diet.  Avoid sweets and sweetened beverages.  Continue to avoid/limit alcohol  Follow-up in 6 months  Addendum: Labs on 6/4 from PCP with AST 31, ALT 26, Alk Phos 94, total bili 0.6, Albumin 4.2

## 2018-10-08 NOTE — Assessment & Plan Note (Addendum)
Chronic. EGD December 2019 with erosive reflux esophagitis. Small hiatal hernia. Erosive gastropathy. Normal duodenal bulb and second portion of the duodenum. Pathology with reactive gastropathy. Has been on Aciphex since EGD and GERD is well controlled. No breakthrough symptoms. No epigastric pain, dysphagia, melena, or hematochezia.   Patient expressed concern about muscle cramps over the last month. Reported history of magnesium and potassium deficiency. PCP recently checked potassium, which was normal. Patient had teeth pulled 2.5 months ago. Significant diet change with limited vegetable and fruit intake. Now incorporating those foods back into her diet and has had improvement in symptoms. Doubt this is related to PPI as patient had been on this several months and only noticed change after significant dietary changes. Advised patient that she could add a daily multivitamin.   Continue Aciphex 20 mg daily 30 minutes before first meal.  Follow-up in 6 months

## 2018-10-12 NOTE — Progress Notes (Signed)
cc'd to pcp 

## 2018-11-03 DIAGNOSIS — M79601 Pain in right arm: Secondary | ICD-10-CM | POA: Diagnosis not present

## 2018-11-03 DIAGNOSIS — I679 Cerebrovascular disease, unspecified: Secondary | ICD-10-CM | POA: Diagnosis not present

## 2018-11-03 DIAGNOSIS — M545 Low back pain: Secondary | ICD-10-CM | POA: Diagnosis not present

## 2018-11-03 DIAGNOSIS — I10 Essential (primary) hypertension: Secondary | ICD-10-CM | POA: Diagnosis not present

## 2018-11-07 DIAGNOSIS — E119 Type 2 diabetes mellitus without complications: Secondary | ICD-10-CM | POA: Diagnosis not present

## 2018-11-07 DIAGNOSIS — I10 Essential (primary) hypertension: Secondary | ICD-10-CM | POA: Diagnosis not present

## 2018-12-09 DIAGNOSIS — I10 Essential (primary) hypertension: Secondary | ICD-10-CM | POA: Diagnosis not present

## 2018-12-09 DIAGNOSIS — E119 Type 2 diabetes mellitus without complications: Secondary | ICD-10-CM | POA: Diagnosis not present

## 2019-01-01 DIAGNOSIS — I10 Essential (primary) hypertension: Secondary | ICD-10-CM | POA: Diagnosis not present

## 2019-01-01 DIAGNOSIS — E119 Type 2 diabetes mellitus without complications: Secondary | ICD-10-CM | POA: Diagnosis not present

## 2019-02-01 DIAGNOSIS — M79601 Pain in right arm: Secondary | ICD-10-CM | POA: Diagnosis not present

## 2019-02-01 DIAGNOSIS — I10 Essential (primary) hypertension: Secondary | ICD-10-CM | POA: Diagnosis not present

## 2019-02-01 DIAGNOSIS — M545 Low back pain: Secondary | ICD-10-CM | POA: Diagnosis not present

## 2019-02-01 DIAGNOSIS — I679 Cerebrovascular disease, unspecified: Secondary | ICD-10-CM | POA: Diagnosis not present

## 2019-03-10 ENCOUNTER — Other Ambulatory Visit: Payer: Self-pay

## 2019-03-10 ENCOUNTER — Encounter (HOSPITAL_COMMUNITY): Payer: Self-pay | Admitting: Emergency Medicine

## 2019-03-10 ENCOUNTER — Emergency Department (HOSPITAL_COMMUNITY)
Admission: EM | Admit: 2019-03-10 | Discharge: 2019-03-10 | Disposition: A | Discharge status: Home / Self Care | Payer: Medicare Other | Attending: Emergency Medicine | Admitting: Emergency Medicine

## 2019-03-10 ENCOUNTER — Emergency Department (HOSPITAL_COMMUNITY): Payer: Medicare Other

## 2019-03-10 DIAGNOSIS — W1782XA Fall from (out of) grocery cart, initial encounter: Secondary | ICD-10-CM | POA: Insufficient documentation

## 2019-03-10 DIAGNOSIS — E119 Type 2 diabetes mellitus without complications: Secondary | ICD-10-CM | POA: Diagnosis not present

## 2019-03-10 DIAGNOSIS — I10 Essential (primary) hypertension: Secondary | ICD-10-CM | POA: Diagnosis not present

## 2019-03-10 DIAGNOSIS — Y9389 Activity, other specified: Secondary | ICD-10-CM | POA: Insufficient documentation

## 2019-03-10 DIAGNOSIS — Y999 Unspecified external cause status: Secondary | ICD-10-CM | POA: Insufficient documentation

## 2019-03-10 DIAGNOSIS — F17210 Nicotine dependence, cigarettes, uncomplicated: Secondary | ICD-10-CM | POA: Diagnosis not present

## 2019-03-10 DIAGNOSIS — R42 Dizziness and giddiness: Secondary | ICD-10-CM | POA: Diagnosis not present

## 2019-03-10 DIAGNOSIS — Z79899 Other long term (current) drug therapy: Secondary | ICD-10-CM | POA: Diagnosis not present

## 2019-03-10 DIAGNOSIS — R55 Syncope and collapse: Secondary | ICD-10-CM

## 2019-03-10 DIAGNOSIS — I951 Orthostatic hypotension: Secondary | ICD-10-CM | POA: Diagnosis not present

## 2019-03-10 DIAGNOSIS — S0990XA Unspecified injury of head, initial encounter: Secondary | ICD-10-CM | POA: Diagnosis not present

## 2019-03-10 DIAGNOSIS — Y92512 Supermarket, store or market as the place of occurrence of the external cause: Secondary | ICD-10-CM | POA: Insufficient documentation

## 2019-03-10 LAB — COMPREHENSIVE METABOLIC PANEL
ALT: 48 U/L — ABNORMAL HIGH (ref 0–44)
AST: 62 U/L — ABNORMAL HIGH (ref 15–41)
Albumin: 3.7 g/dL (ref 3.5–5.0)
Alkaline Phosphatase: 82 U/L (ref 38–126)
Anion gap: 17 — ABNORMAL HIGH (ref 5–15)
BUN: 18 mg/dL (ref 6–20)
CO2: 24 mmol/L (ref 22–32)
Calcium: 9.3 mg/dL (ref 8.9–10.3)
Chloride: 94 mmol/L — ABNORMAL LOW (ref 98–111)
Creatinine, Ser: 1.25 mg/dL — ABNORMAL HIGH (ref 0.44–1.00)
GFR calc Af Amer: 55 mL/min — ABNORMAL LOW (ref >60)
GFR calc non Af Amer: 47 mL/min — ABNORMAL LOW (ref >60)
Glucose, Bld: 300 mg/dL — ABNORMAL HIGH (ref 70–99)
Potassium: 4.3 mmol/L (ref 3.5–5.1)
Sodium: 135 mmol/L (ref 135–145)
Total Bilirubin: 0.8 mg/dL (ref 0.3–1.2)
Total Protein: 7.4 g/dL (ref 6.5–8.1)

## 2019-03-10 LAB — CBC WITH DIFFERENTIAL/PLATELET
Abs Immature Granulocytes: 0.06 10*3/uL (ref 0.00–0.07)
Basophils Absolute: 0.1 10*3/uL (ref 0.0–0.1)
Basophils Relative: 1 %
Eosinophils Absolute: 0.4 10*3/uL (ref 0.0–0.5)
Eosinophils Relative: 4 %
HCT: 50.3 % — ABNORMAL HIGH (ref 36.0–46.0)
Hemoglobin: 15.7 g/dL — ABNORMAL HIGH (ref 12.0–15.0)
Immature Granulocytes: 1 %
Lymphocytes Relative: 24 %
Lymphs Abs: 2.4 10*3/uL (ref 0.7–4.0)
MCH: 29.7 pg (ref 26.0–34.0)
MCHC: 31.2 g/dL (ref 30.0–36.0)
MCV: 95.1 fL (ref 80.0–100.0)
Monocytes Absolute: 0.6 10*3/uL (ref 0.1–1.0)
Monocytes Relative: 6 %
Neutro Abs: 6.4 10*3/uL (ref 1.7–7.7)
Neutrophils Relative %: 64 %
Platelets: 221 10*3/uL (ref 150–400)
RBC: 5.29 MIL/uL — ABNORMAL HIGH (ref 3.87–5.11)
RDW: 13.2 % (ref 11.5–15.5)
WBC: 10 10*3/uL (ref 4.0–10.5)
nRBC: 0 % (ref 0.0–0.2)

## 2019-03-10 LAB — POC URINE PREG, ED: Preg Test, Ur: NEGATIVE

## 2019-03-10 LAB — D-DIMER, QUANTITATIVE: D-Dimer, Quant: 0.61 ug/mL-FEU — ABNORMAL HIGH (ref 0.00–0.50)

## 2019-03-10 LAB — I-STAT BETA HCG BLOOD, ED (MC, WL, AP ONLY): I-stat hCG, quantitative: 19.5 m[IU]/mL — ABNORMAL HIGH (ref <5)

## 2019-03-10 LAB — I-STAT CHEM 8, ED
BUN: 19 mg/dL (ref 6–20)
Calcium, Ion: 1.11 mmol/L — ABNORMAL LOW (ref 1.15–1.40)
Chloride: 97 mmol/L — ABNORMAL LOW (ref 98–111)
Creatinine, Ser: 1.3 mg/dL — ABNORMAL HIGH (ref 0.44–1.00)
Glucose, Bld: 291 mg/dL — ABNORMAL HIGH (ref 70–99)
HCT: 49 % — ABNORMAL HIGH (ref 36.0–46.0)
Hemoglobin: 16.7 g/dL — ABNORMAL HIGH (ref 12.0–15.0)
Potassium: 4.5 mmol/L (ref 3.5–5.1)
Sodium: 135 mmol/L (ref 135–145)
TCO2: 27 mmol/L (ref 22–32)

## 2019-03-10 LAB — CBG MONITORING, ED: Glucose-Capillary: 288 mg/dL — ABNORMAL HIGH (ref 70–99)

## 2019-03-10 LAB — HCG, SERUM, QUALITATIVE: Preg, Serum: NEGATIVE

## 2019-03-10 LAB — TROPONIN I (HIGH SENSITIVITY)
Troponin I (High Sensitivity): 4 ng/L (ref <18)
Troponin I (High Sensitivity): 5 ng/L (ref <18)

## 2019-03-10 MED ORDER — SODIUM CHLORIDE 0.9 % IV BOLUS
1000.0000 mL | Freq: Once | INTRAVENOUS | Status: AC
  Administered 2019-03-10: 1000 mL via INTRAVENOUS

## 2019-03-10 MED ORDER — IOHEXOL 350 MG/ML SOLN
100.0000 mL | Freq: Once | INTRAVENOUS | Status: AC | PRN
  Administered 2019-03-10: 100 mL via INTRAVENOUS

## 2019-03-10 MED ORDER — SODIUM CHLORIDE 0.9 % IV BOLUS
500.0000 mL | Freq: Once | INTRAVENOUS | Status: AC
  Administered 2019-03-10: 500 mL via INTRAVENOUS

## 2019-03-10 NOTE — Discharge Instructions (Addendum)
You have been diagnosed today with Syncope and collapse, orthostatic hypotension, head injury.  At this time there does not appear to be the presence of an emergent medical condition, however there is always the potential for conditions to change. Please read and follow the below instructions.  Please return to the Emergency Department immediately for any new or worsening symptoms or if your symptoms reoccur. Please be sure to follow up with your Primary Care Provider within one week regarding your visit today; please call their office to schedule an appointment even if you are feeling better for a follow-up visit. Please stop taking your diuretic medication for the next 3 days as this may be the cause of your dehydration and passing out today.  See your primary care doctor on Friday of this week for recheck and to discuss medication management.  Call your primary care doctor's office first thing tomorrow morning to schedule an appointment and to discuss your ED visit today. Your CT scan today showed hepatic steatosis and findings concerning for cirrhosis and possible portal hypertension of your liver.  Please call the gastroenterologist Dr. Gala Romney on your discharge paperwork tomorrow morning to schedule a follow-up appointment and to discuss further evaluation. Your CT scan today showed atherosclerosis, coronary artery disease, cardiomegaly, cervical spondylosis.  Please discuss these incidental findings with your primary care provider at your visit this week to see if any additional evaluation is needed. Please drink plenty of water to avoid further dehydration and get plenty of rest.  Get help right away if: You have: A very bad headache that is not helped by medicine. Trouble walking or weakness in your arms and legs. Clear or bloody fluid coming from your nose or ears. Changes in how you see (vision). Shaking movements that you cannot control. You lose your balance. You vomit. The black  centers of your eyes (pupils) change in size. Your speech is slurred. Your dizziness gets worse. You pass out. You are sleepier than normal and have trouble staying awake. You have a very bad headache. You pass out once or more than once. You have pain in your chest, belly, or back. You have a very fast or uneven heartbeat (palpitations). It hurts to breathe. You are bleeding from your mouth or your bottom (rectum). You have black or tarry poop (stool). You have jerky movements that you cannot control (seizure). You are confused. You have trouble walking. You are very weak. You have vision problems. You have any new/concerning or worsening of symptoms  Please read the additional information packets attached to your discharge summary.  Do not take your medicine if  develop an itchy rash, swelling in your mouth or lips, or difficulty breathing; call 911 and seek immediate emergency medical attention if this occurs.  Note: Portions of this text may have been transcribed using voice recognition software. Every effort was made to ensure accuracy; however, inadvertent computerized transcription errors may still be present.

## 2019-03-10 NOTE — ED Provider Notes (Signed)
[facility] EMERGENCY DEPARTMENT Provider Note   CSN: [id number] Arrival date & time: 03/10/19  1306     History   Chief Complaint Chief Complaint  Patient presents with   Loss of Consciousness    HPI Wendy Kramer is a 58 y.o. female history murmur, diabetes, hyperlipidemia, kidney stones, hypertension, morbid obesity, PFO, CVA, asthma, GERD.  Patient reports that she was at [facility] today wearing multiple jackets and walking towards the checkout counter when she suddenly became sweaty and felt lightheaded.  Patient reports that she then went to go sit down on the bag cart, she reports he sat here for a few moments before losing consciousness and waking up on the floor.  Patient reports that when she woke up she had pain to the back of her head and her neck mild aching sensation constant worsened with movement and palpation improved with rest that has gradually improved since onset.  Patient believes that she fell backwards striking her head on the ground off of the bag cart.  EMS was called and patient was noted to be hypotensive on arrival with a systolic pressure of 90.  Patient reports that prior to onset of sweatiness and lightheadedness that she had been feeling well today.  Patient denies any fevers/chills, headache, vision changes, numbness/weakness, tingling, chest pain/shortness of breath, pain preceding LOC, abdominal pain, nausea/vomiting, diarrhea, dysuria/hematuria, extremity pain/swelling or any additional concerns.  Of note patient reports that she has recently stopped taking her Plavix due to cost, she was stopped taking her Metformin as she is trying to wean herself off her diabetic medications, additionally she reports that she has increased her HCTZ recently.  She reports that she has had increased urination over the past several days.     HPI  Past Medical History:  Diagnosis Date   Arthritis    Cardiac murmur    Cervical spine disease    Diabetes mellitus  without complication (HCC)    diet controlled   Exogenous hyperlipidemia    History of kidney stones    HTN (hypertension)    Morbid obesity due to excess calories (HCC)    Other chronic pain    Panic disorder    Patent foramen ovale    TEE 2018   Prediabetes    Sleep apnea    Stroke ([place])    "mini-stroke" no deficits from this.   Unspecified asthma with (acute) exacerbation    Vitamin D deficiency     Patient Active Problem List   Diagnosis Date Noted   Positive colorectal cancer screening using Cologuard test 12/24/2017   Abdominal pain, epigastric 12/24/2017   GERD (gastroesophageal reflux disease) 12/24/2017   Fatty liver 12/24/2017    Past Surgical History:  Procedure Laterality Date   BIOPSY  03/05/2018   Procedure: BIOPSY;  Surgeon: Daneil Dolin, MD;  Location: [facility];  Service: Endoscopy;;  gastric   CHOLECYSTECTOMY     COLONOSCOPY WITH PROPOFOL N/A 03/05/2018   Procedure: COLONOSCOPY WITH PROPOFOL;  Surgeon: Daneil Dolin, MD;  Location: [facility];  Service: Endoscopy;  Laterality: N/A;  8:30am   ESOPHAGOGASTRODUODENOSCOPY (EGD) WITH PROPOFOL N/A 03/05/2018   Procedure: ESOPHAGOGASTRODUODENOSCOPY (EGD) WITH PROPOFOL;  Surgeon: Daneil Dolin, MD;  Location: [facility];  Service: Endoscopy;  Laterality: N/A;  Patient has DDD of neck with spinal stenosis   HERNIA REPAIR     umbilical   POLYPECTOMY  03/05/2018   Procedure: POLYPECTOMY;  Surgeon: Daneil Dolin, MD;  Location:  [facility];  Service: Endoscopy;;  colon   SHOULDER ARTHROSCOPY Right    TEE WITHOUT CARDIOVERSION  2018   patent foramen ovale   TONSILLECTOMY AND ADENOIDECTOMY     TUBAL LIGATION       OB History   No obstetric history on file.      Home Medications    Prior to Admission medications   Medication Sig Start Date End Date Taking? Authorizing Provider  albuterol (PROVENTIL HFA;VENTOLIN HFA) 108 (90 Base) MCG/ACT inhaler Inhale 1-2 puffs  into the lungs every 6 (six) hours as needed for wheezing or shortness of breath.   Yes [provider]  ALPRAZolam Duanne Moron) 0.5 MG tablet Take 0.25 mg by mouth daily as needed for anxiety.    Yes [provider]  budesonide-formoterol (SYMBICORT) 80-4.5 MCG/ACT inhaler Inhale 1 puff into the lungs 2 (two) times daily.    Yes [provider]  Cholecalciferol (VITAMIN D3) 50 MCG (2000 UT) TABS Take 2,000 Units by mouth daily.    Yes [provider]  clopidogrel (PLAVIX) 75 MG tablet Take 1 tablet (75 mg total) by mouth daily. 06/02/17  Yes Milton Ferguson, MD  cyclobenzaprine (FLEXERIL) 5 MG tablet Take 5 mg by mouth daily as needed for muscle spasms.  08/18/17  Yes [provider]  fluticasone (FLONASE) 50 MCG/ACT nasal spray Place 1 spray into both nostrils daily as needed for allergies or rhinitis.    Yes [provider]  lisinopril-hydrochlorothiazide (ZESTORETIC) 10-12.5 MG tablet Take 1 tablet by mouth daily.    Yes [provider]  RABEprazole (ACIPHEX) 20 MG tablet TAKE 1 TABLET BY MOUTH EVERY DAY 03/11/18  Yes Mahala Menghini, PA-C  traMADol (ULTRAM) 50 MG tablet Take 50 mg by mouth every 6 (six) hours as needed for moderate pain.  09/20/16  Yes [provider]    Family History Family History  Problem Relation Age of Onset   Breast cancer Maternal Aunt    Colon cancer Other        maternal great grandfather    Social History Social History   Tobacco Use   Smoking status: Current Every Day Smoker    Packs/day: 0.50    Years: 40.00    Pack years: 20.00    Types: Cigarettes   Smokeless tobacco: Never Used  Substance Use Topics   Alcohol use: No   Drug use: No     Allergies   Asa [aspirin], Augmentin [amoxicillin-pot clavulanate], Levaquin [levofloxacin in d5w], and Lovenox [enoxaparin sodium]   Review of Systems Review of Systems Ten systems are reviewed and are negative for acute change except as  noted in the HPI   Physical Exam Updated Vital Signs BP (!) 114/53    Pulse 74    Temp 98.2 F (36.8 C)    Resp 10    Wt 113.4 kg    LMP 09/06/2016    SpO2 97%    BMI 42.91 kg/m   Physical Exam Constitutional:      General: She is not in acute distress.    Appearance: Normal appearance. She is well-developed. She is not ill-appearing or diaphoretic.  HENT:     Head: Normocephalic and atraumatic.     Right Ear: External ear normal.     Left Ear: External ear normal.     Nose: Nose normal.  Eyes:     General: Vision grossly intact. Gaze aligned appropriately.     Pupils: Pupils are equal, round, and reactive to  light.  Neck:     Musculoskeletal: Normal range of motion.     Trachea: Trachea and phonation normal. No tracheal deviation.  Cardiovascular:     Rate and Rhythm: Normal rate and regular rhythm.     Pulses: Normal pulses.     Heart sounds: Normal heart sounds.  Pulmonary:     Effort: Pulmonary effort is normal. No respiratory distress.     Breath sounds: Normal breath sounds.  Abdominal:     General: There is no distension.     Palpations: Abdomen is soft.     Tenderness: There is no abdominal tenderness. There is no guarding or rebound.  Musculoskeletal: Normal range of motion.        General: No tenderness.     Right lower leg: No edema.     Left lower leg: No edema.  Skin:    General: Skin is warm and dry.  Neurological:     Mental Status: She is alert.     GCS: GCS eye subscore is 4. GCS verbal subscore is 5. GCS motor subscore is 6.     Comments: Speech is clear and goal oriented, follows commands Major Cranial nerves without deficit, no facial droop Moves extremities without ataxia, coordination intact  Psychiatric:        Behavior: Behavior normal.      ED Treatments / Results  Labs (all labs ordered are listed, but only abnormal results are displayed) Labs Reviewed  CBC WITH DIFFERENTIAL/PLATELET - Abnormal; Notable for the following components:        Result Value   RBC 5.29 (*)    Hemoglobin 15.7 (*)    HCT 50.3 (*)    All other components within normal limits  COMPREHENSIVE METABOLIC PANEL - Abnormal; Notable for the following components:   Chloride 94 (*)    Glucose, Bld 300 (*)    Creatinine, Ser 1.25 (*)    AST 62 (*)    ALT 48 (*)    GFR calc non Af Amer 47 (*)    GFR calc Af Amer 55 (*)    Anion gap 17 (*)    All other components within normal limits  D-DIMER, QUANTITATIVE (NOT AT [facility]) - Abnormal; Notable for the following components:   D-Dimer, Quant 0.61 (*)    All other components within normal limits  CBG MONITORING, ED - Abnormal; Notable for the following components:   Glucose-Capillary 288 (*)    All other components within normal limits  I-STAT BETA HCG BLOOD, ED ([facility], [facility], [facility] ONLY) - Abnormal; Notable for the following components:   I-stat hCG, quantitative 19.5 (*)    All other components within normal limits  I-STAT CHEM 8, ED - Abnormal; Notable for the following components:   Chloride 97 (*)    Creatinine, Ser 1.30 (*)    Glucose, Bld 291 (*)    Calcium, Ion 1.11 (*)    Hemoglobin 16.7 (*)    HCT 49.0 (*)    All other components within normal limits  HCG, SERUM, QUALITATIVE  POC URINE PREG, ED  TROPONIN I (HIGH SENSITIVITY)  TROPONIN I (HIGH SENSITIVITY)    EKG EKG Interpretation  Date/Time:  Wednesday March 10 2019 13:19:40 EST Ventricular Rate:  86 PR Interval:    QRS Duration: 84 QT Interval:  373 QTC Calculation: 447 R Axis:   68 Text Interpretation: Sinus rhythm Confirmed by Elnora Morrison [id number]) on 03/10/2019 2:44:18 PM   Radiology Ct Head Wo Contrast  Result Date:  03/10/2019 CLINICAL DATA:  Syncope. Fall with head injury. On anticoagulation. EXAM: CT HEAD WITHOUT CONTRAST CT CERVICAL SPINE WITHOUT CONTRAST TECHNIQUE: Multidetector CT imaging of the head and cervical spine was performed following the standard protocol without intravenous contrast. Multiplanar CT image  reconstructions of the cervical spine were also generated. COMPARISON:  CT head 06/02/2017 FINDINGS: CT HEAD FINDINGS Brain: No evidence of acute infarction, hemorrhage, hydrocephalus, extra-axial collection or mass lesion/mass effect. Benign-appearing cyst left basal ganglia appear unchanged Vascular: Negative for hyperdense vessel Skull: Posterior scalp hematoma on the right. Negative for skull fracture. Sinuses/Orbits: Negative Other: None CT CERVICAL SPINE FINDINGS Alignment: Mild anterolisthesis C3-4 and C4-5. Cervical kyphosis. Skull base and vertebrae: Negative for fracture Soft tissues and spinal canal: Negative for soft tissue swelling or mass. Atherosclerotic calcification in the carotid artery bilaterally. Disc levels: Multilevel disc degeneration and spurring most prominent at C5-6 and C6-7 causing mild spinal stenosis. Upper chest: Lung apices clear bilaterally Other: None IMPRESSION: 1. Posterior scalp hematoma on the right. No acute intracranial abnormality. 2. Cervical spondylosis. Negative for cervical spine fracture. Electronically Signed   By: Franchot Gallo M.D.   On: 03/10/2019 14:46   Ct Angio Chest Pe W And/or Wo Contrast  Result Date: 03/10/2019 CLINICAL DATA:  Dizziness. Positive D-dimer. EXAM: CT ANGIOGRAPHY CHEST WITH CONTRAST TECHNIQUE: Multidetector CT imaging of the chest was performed using the standard protocol during bolus administration of intravenous contrast. Multiplanar CT image reconstructions and MIPs were obtained to evaluate the vascular anatomy. CONTRAST:  18mL OMNIPAQUE IOHEXOL 350 MG/ML SOLN COMPARISON:  None. FINDINGS: Cardiovascular: Contrast injection is sufficient to demonstrate satisfactory opacification of the pulmonary arteries to the segmental level. There is no pulmonary embolus. The main pulmonary artery is within normal limits for size. There is no CT evidence of acute right heart strain. Atherosclerotic changes are noted of the thoracic aorta without  evidence for an aneurysm. Heart size is enlarged. Coronary artery calcifications are noted. There is no significant pericardial effusion. Mediastinum/Nodes: --No mediastinal or hilar lymphadenopathy. --No axillary lymphadenopathy. --No supraclavicular lymphadenopathy. --Normal thyroid gland. --The esophagus is unremarkable Lungs/Pleura: There is a calcified granuloma in the right upper lobe. There is no pneumothorax. No large pleural effusion. The trachea is unremarkable. There is some atelectasis versus scarring in the lingula. Upper Abdomen: There is hepatic steatosis. There is a mildly nodular contour of the liver surface. The patient is status post prior cholecystectomy. The spleen is borderline enlarged. Musculoskeletal: No chest wall abnormality. No acute or significant osseous findings. Review of the MIP images confirms the above findings. IMPRESSION: 1. There is no evidence for acute pulmonary embolus or aortic dissection. 2. Coronary artery disease and cardiomegaly. 3. Hepatic steatosis with a mildly nodular contour of the liver surface. Borderline splenomegaly. Findings are concerning for cirrhosis with possible developing portal hypertension. Aortic Atherosclerosis (ICD10-I70.0). Electronically Signed   By: Constance Holster M.D.   On: 03/10/2019 19:07   Ct Cervical Spine Wo Contrast  Result Date: 03/10/2019 CLINICAL DATA:  Syncope. Fall with head injury. On anticoagulation. EXAM: CT HEAD WITHOUT CONTRAST CT CERVICAL SPINE WITHOUT CONTRAST TECHNIQUE: Multidetector CT imaging of the head and cervical spine was performed following the standard protocol without intravenous contrast. Multiplanar CT image reconstructions of the cervical spine were also generated. COMPARISON:  CT head 06/02/2017 FINDINGS: CT HEAD FINDINGS Brain: No evidence of acute infarction, hemorrhage, hydrocephalus, extra-axial collection or mass lesion/mass effect. Benign-appearing cyst left basal ganglia appear unchanged Vascular:  Negative for hyperdense vessel Skull: Posterior scalp hematoma  on the right. Negative for skull fracture. Sinuses/Orbits: Negative Other: None CT CERVICAL SPINE FINDINGS Alignment: Mild anterolisthesis C3-4 and C4-5. Cervical kyphosis. Skull base and vertebrae: Negative for fracture Soft tissues and spinal canal: Negative for soft tissue swelling or mass. Atherosclerotic calcification in the carotid artery bilaterally. Disc levels: Multilevel disc degeneration and spurring most prominent at C5-6 and C6-7 causing mild spinal stenosis. Upper chest: Lung apices clear bilaterally Other: None IMPRESSION: 1. Posterior scalp hematoma on the right. No acute intracranial abnormality. 2. Cervical spondylosis. Negative for cervical spine fracture. Electronically Signed   By: Franchot Gallo M.D.   On: 03/10/2019 14:46   Dg Chest Portable 1 View  Result Date: 03/10/2019 CLINICAL DATA:  Syncopal episode today with a fall and blow to the head. EXAM: PORTABLE CHEST 1 VIEW COMPARISON:  None. FINDINGS: Lungs are clear. Heart size is normal. No pneumothorax or pleural effusion. No acute bony abnormality. Patient status post right rotator cuff repair. IMPRESSION: No acute disease. Electronically Signed   By: Inge Rise M.D.   On: 03/10/2019 13:51    Procedures Procedures (including critical care time)  Medications Ordered in ED Medications  sodium chloride 0.9 % bolus 1,000 mL (0 mLs Intravenous Stopped 03/10/19 1624)  iohexol (OMNIPAQUE) 350 MG/ML injection 100 mL (100 mLs Intravenous Contrast Given 03/10/19 1830)  sodium chloride 0.9 % bolus 500 mL (0 mLs Intravenous Stopped 03/10/19 1851)     Initial Impression / Assessment and Plan / ED Course  I have reviewed the triage vital signs and the nursing notes.  Pertinent labs & imaging results that were available during my care of the patient were reviewed by me and considered in my medical decision making (see chart for details).    58 year old female arrives  well-appearing and in no acute distress after losing consciousness at [facility], loss of consciousness was preceded by diaphoresis and lightheadedness, she denies any preceding chest pain or shortness of breath.  She fell backwards striking her head and has a headache and neck pain.  I have asked nursing staff to place patient in a hard cervical collar to protect her C-spine.  She is prescribed Plavix has missed a few doses but will obtain CT imaging of the head and neck to evaluate for injury.  Plan at this time is to obtain blood work, EKG and to monitor patient to assess reason for loss of consciousness today.  On arrival she is not tachycardic but her SPO2 varies between 91 and 93% on room air during our conversation I have added a D-dimer for evaluation of pulmonary embolism as source, she denies any chest pain or shortness of breath.  Additionally patient has been noncompliant with her diabetic medications CMP to assess for abnormalities and she has increased her Lasix will give fluid bolus. - I-STAT Chem-8 nonacute Beta-hCG 19.5, suspect false positive Urine pregnancy Qualitative hCG negative CBG 288 CBC nonacute CMP with creatinine 1.25, anion gap 17, mild elevation of AST and ALT, glucose 300.  Suspect abnormality secondary to dehydration, bicarb normal, doubt DKA. Initial high-sensitivity troponin: 4 Delta high-sensitivity troponin: 5 D-dimer 0.61 CT angio PE study:  IMPRESSION:  1. There is no evidence for acute pulmonary embolus or aortic  dissection.  2. Coronary artery disease and cardiomegaly.  3. Hepatic steatosis with a mildly nodular contour of the liver  surface. Borderline splenomegaly. Findings are concerning for  cirrhosis with possible developing portal hypertension.    Aortic Atherosclerosis (ICD10-I70.0).   CT Head/C-Spine:  IMPRESSION:  1.  Posterior scalp hematoma on the right. No acute intracranial  abnormality.  2. Cervical spondylosis. Negative for cervical  spine fracture.   CXR:    IMPRESSION:  No acute disease.   EKG: Sinus rhythm Confirmed by Elnora Morrison [phone]) on 03/10/2019 2:44:18 PM - C-collar was removed, reassessment of the neck reveals no midline tenderness, step-off, crepitus or deformity, she endorses some left sided muscular trapezius tenderness to palpation reports this is chronic for her, she denies any neurologic complaint, C-spine cleared and no indication for advanced imaging at this time.  Work-up above reassuring, suspect patient's diaphoresis, lightheadedness and syncopal episode secondary to dehydration as is supported by elevation of creatinine that 1.25 and hypotension which is resolved with fluid boluses today.  Suspect dehydration is secondary to her increasing her diuretic.  Case discussed with Dr. Reather Converse who has seen the patient, plan of care is to have patient hold her diuretic for 3 days and to see her primary care doctor at the end of this week for reassessment.  Orthostatic vital signs obtained in the ER and are negative.  Patient reports that she is feeling improved and would like to go home, she is agreeable to above plan and has no further questions or concerns.  As above suspect syncope secondary to orthostatic hypotension no evidence of anemia, arrhythmia, hypoglycemia, AAA, dissection ectopic, PE, SAH or other emergent etiologies.  Patient informed of incidental finding of possible cirrhosis and portal hypertension and has been advised to follow-up with gastroenterologist Dr. Gala Romney this week for evaluation.  Discussed additional incidental findings as above, patient states understanding and plan spoke with your PCP this week.  On reevaluation patient sitting comfortably watching TV and in no acute distress.  She reports that she is feeling well and would like to go home.  At this time there does not appear to be any evidence of an acute emergency medical condition and the patient appears stable for discharge with  appropriate outpatient follow up. Diagnosis was discussed with patient who verbalizes understanding of care plan and is agreeable to discharge. I have discussed return precautions with patient who verbalizes understanding of return precautions. Patient encouraged to follow-up with their PCP and gastroenterology. All questions answered. Patient has been discharged in good condition.   Note: Portions of this report may have been transcribed using voice recognition software. Every effort was made to ensure accuracy; however, inadvertent computerized transcription errors may still be present. Final Clinical Impressions(s) / ED Diagnoses   Final diagnoses:  Syncope and collapse  Orthostatic hypotension  Injury of head, initial encounter    ED Discharge Orders    None       Gari Crown 03/10/19 2118    Elnora Morrison, MD 03/11/19 [phone]

## 2019-03-10 NOTE — ED Triage Notes (Signed)
Pt had a syncopal episode at [place] trying to check out, pt became dizzy, LOC and hit her head. Pt hypotensive en route.

## 2019-03-16 ENCOUNTER — Other Ambulatory Visit: Payer: Self-pay

## 2019-03-16 ENCOUNTER — Ambulatory Visit (INDEPENDENT_AMBULATORY_CARE_PROVIDER_SITE_OTHER): Payer: Medicare Other | Admitting: Gastroenterology

## 2019-03-16 ENCOUNTER — Encounter: Payer: Self-pay | Admitting: Gastroenterology

## 2019-03-16 VITALS — BP 160/84 | HR 88 | Temp 97.2°F | Ht 64.0 in | Wt 249.8 lb

## 2019-03-16 DIAGNOSIS — R945 Abnormal results of liver function studies: Secondary | ICD-10-CM

## 2019-03-16 DIAGNOSIS — R7989 Other specified abnormal findings of blood chemistry: Secondary | ICD-10-CM

## 2019-03-16 DIAGNOSIS — E119 Type 2 diabetes mellitus without complications: Secondary | ICD-10-CM

## 2019-03-16 DIAGNOSIS — K760 Fatty (change of) liver, not elsewhere classified: Secondary | ICD-10-CM | POA: Diagnosis not present

## 2019-03-16 NOTE — Patient Instructions (Signed)
1. Please have your labs done. You can have them done after you see Dr. Legrand Kramer this week in case he adds anything. 2. You need to get back on your diabetes medications as controlling your diabetes is very important in managing your fatty liver and prevent further damage.  3. Try to lose 25 pounds over the next six months.  4. Be as active as possible. 5. Return to the office in four months and then we will need to see you every six months at a minimum to evaluate your liver with blood work and ultrasound given recent CT findings of scarring in your liver.  6. Follow up with cardiology, call [phone].   Fatty Liver Disease  Fatty liver disease occurs when too much fat has built up in your liver cells. Fatty liver disease is also called hepatic steatosis or steatohepatitis. The liver removes harmful substances from your bloodstream and produces fluids that your body needs. It also helps your body use and store energy from the food you eat. In many cases, fatty liver disease does not cause symptoms or problems. It is often diagnosed when tests are being done for other reasons. However, over time, fatty liver can cause inflammation that may lead to more serious liver problems, such as scarring of the liver (cirrhosis) and liver failure. Fatty liver is associated with insulin resistance, increased body fat, high blood pressure (hypertension), and high cholesterol. These are features of metabolic syndrome and increase your risk for stroke, diabetes, and heart disease. What are the causes? This condition may be caused by:  Drinking too much alcohol.  Poor nutrition.  Obesity.  Cushing's syndrome.  Diabetes.  High cholesterol.  Certain drugs.  Poisons.  Some viral infections.  Pregnancy. What increases the risk? You are more likely to develop this condition if you:  Abuse alcohol.  Are overweight.  Have diabetes.  Have hepatitis.  Have a high triglyceride level.  Are  pregnant. What are the signs or symptoms? Fatty liver disease often does not cause symptoms. If symptoms do develop, they can include:  Fatigue.  Weakness.  Weight loss.  Confusion.  Abdominal pain.  Nausea and vomiting.  Yellowing of your skin and the white parts of your eyes (jaundice).  Itchy skin. How is this diagnosed? This condition may be diagnosed by:  A physical exam and medical history.  Blood tests.  Imaging tests, such as an ultrasound, CT scan, or MRI.  A liver biopsy. A small sample of liver tissue is removed using a needle. The sample is then looked at under a microscope. How is this treated? Fatty liver disease is often caused by other health conditions. Treatment for fatty liver may involve medicines and lifestyle changes to manage conditions such as:  Alcoholism.  High cholesterol.  Diabetes.  Being overweight or obese. Follow these instructions at home:   Do not drink alcohol. If you have trouble quitting, ask your health care provider how to safely quit with the help of medicine or a supervised program. This is important to keep your condition from getting worse.  Eat a healthy diet as told by your health care provider. Ask your health care provider about working with a diet and nutrition specialist (dietitian) to develop an eating plan.  Exercise regularly. This can help you lose weight and control your cholesterol and diabetes. Talk to your health care provider about an exercise plan and which activities are best for you.  Take over-the-counter and prescription medicines only as told by  your health care provider.  Keep all follow-up visits as told by your health care provider. This is important. Contact a health care provider if: You have trouble controlling your:  Blood sugar. This is especially important if you have diabetes.  Cholesterol.  Drinking of alcohol. Get help right away if:  You have abdominal pain.  You have  jaundice.  You have nausea and vomiting.  You vomit blood or material that looks like coffee grounds.  You have stools that are black, tar-like, or bloody. Summary  Fatty liver disease develops when too much fat builds up in the cells of your liver.  Fatty liver disease often causes no symptoms or problems. However, over time, fatty liver can cause inflammation that may lead to more serious liver problems, such as scarring of the liver (cirrhosis).  You are more likely to develop this condition if you abuse alcohol, are pregnant, are overweight, have diabetes, have hepatitis, or have high triglyceride levels.  Contact your health care provider if you have trouble controlling your weight, blood sugar, cholesterol, or drinking of alcohol. This information is not intended to replace advice given to you by your health care provider. Make sure you discuss any questions you have with your health care provider. Document Released: 05/03/2005 Document Revised: 02/28/2017 Document Reviewed: 12/25/2016 Elsevier Patient Education  2020 [employer].    Nonalcoholic Fatty Liver Disease Diet, Adult Nonalcoholic fatty liver disease is a condition that causes fat to build up in and around the liver. The disease makes it harder for the liver to work the way that it should. Following a healthy diet can help to keep nonalcoholic fatty liver disease under control. It can also help to prevent or improve conditions that are associated with the disease, such as heart disease, diabetes, high blood pressure, and abnormal cholesterol levels. Along with regular exercise, this diet:  Promotes weight loss.  Helps to control blood sugar levels.  Helps to improve the way that the body uses insulin. What are tips for following this plan? Reading food labels Always check food labels for:  The amount of saturated fat in a food. You should limit your intake of saturated fat. Saturated fat is found in foods that  come from animals, including meat and dairy products such as butter, cheese, and whole milk.  The amount of fiber in a food. You should choose high-fiber foods such as fruits, vegetables, and whole grains. Try to get 25-30 grams (g) of fiber a day.  Cooking  When cooking, use heart-healthy oils that are high in monounsaturated fats. These include olive oil, canola oil, and avocado oil.  Limit frying or deep-frying foods. Cook foods using healthy methods such as baking, boiling, steaming, and grilling instead. Meal planning  You may want to keep track of how many calories you take in. Eating the right amount of calories will help you achieve a healthy weight. Meeting with a registered dietitian can help you get started.  Limit how often you eat takeout and fast food. These foods are usually very high in fat, salt, and sugar.  Use the glycemic index (GI) to plan your meals. The index tells you how quickly a food will raise your blood sugar. Choose low-GI foods (GI less than 55). These foods take a longer time to raise blood sugar. A registered dietitian can help you identify foods lower on the GI scale. Lifestyle  You may want to follow a Mediterranean diet. This diet includes a lot of vegetables, lean  meats or fish, whole grains, fruits, and healthy oils and fats. What foods can I eat?  Fruits Bananas. Apples. Oranges. Grapes. Papaya. Mango. Pomegranate. Kiwi. Grapefruit. Cherries. Vegetables Lettuce. Spinach. Peas. Beets. Cauliflower. Cabbage. Broccoli. Carrots. Tomatoes. Squash. Eggplant. Herbs. Peppers. Onions. Cucumbers. Brussels sprouts. Yams and sweet potatoes. Beans. Lentils. Grains Whole wheat or whole-grain foods, including breads, crackers, cereals, and pasta. Stone-ground whole wheat. Unsweetened oatmeal. Bulgur. Barley. Quinoa. Brown or wild rice. Corn or whole wheat flour tortillas. Meats and other proteins Lean meats. Poultry. Tofu. Seafood and shellfish. Dairy Low-fat or  fat-free dairy products, such as yogurt, cottage cheese, or cheese. Beverages Water. Sugar-free drinks. Tea. Coffee. Low-fat or skim milk. Milk alternatives, such as soy or almond milk. Real fruit juice. Fats and oils Avocado. Canola or olive oil. Nuts and nut butters. Seeds. Seasonings and condiments Mustard. Relish. Low-fat, low-sugar ketchup and barbecue sauce. Low-fat or fat-free mayonnaise. Sweets and desserts Sugar-free sweets. The items listed above may not be a complete list of foods and beverages you can eat. Contact a dietitian for more information. What foods should I limit or avoid? Meats and other proteins Limit red meat to 1-2 times a week. Dairy [employer]. Fats and oils Palm oil and coconut oil. Fried foods. Other foods Processed foods. Foods that contain a lot of salt or sodium. Sweets and desserts Sweets that contain sugar. Beverages Sweetened drinks, such as sweet tea, milkshakes, iced sweet drinks, and sodas. Alcohol. The items listed above may not be a complete list of foods and beverages you should avoid. Contact a dietitian for more information. Where to find more information The [employer] of Diabetes and Digestive and Kidney Diseases: [email or web address] Summary  Nonalcoholic fatty liver disease is a condition that causes fat to build up in and around the liver.  Following a healthy diet can help to keep nonalcoholic fatty liver disease under control. Your diet should be rich in fruits, vegetables, whole grains, and lean proteins.  Limit your intake of saturated fat. Saturated fat is found in foods that come from animals, including meat and dairy products such as butter, cheese, and whole milk.  This diet promotes weight loss, helps to control blood sugar levels, and helps to improve the way that the body uses insulin. This information is not intended to replace advice given to you by your health care provider. Make sure you discuss any questions you  have with your health care provider. Document Released: 08/02/2014 Document Revised: 07/10/2018 Document Reviewed: 04/09/2018 Elsevier Patient Education  2020 [employer].

## 2019-03-16 NOTE — Assessment & Plan Note (Signed)
Fatty liver noted on prior imaging but recent CT with mildly nodular liver contour and borderline splenomegaly worrisome for cirrhosis. No evidence of portal hypertension on prior EGD last year. Platelet count is normal. 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 Wendy Kramer really needs to get her weight and diabetes under control. Wendy Kramer has been trying to manage diabetes with diet alone. Last A1c in 08/2018 was 8.9. Wendy Kramer has follow up with PCP this week. Encouraged her to restart diabetes medications. Encouraged 25 pound weight loss in the next six months. We will obtain labs to exclude other causes of liver disease. Return to the office in four months. Wendy Kramer is aware that Wendy Kramer will need maintain relationship with [place] twice per year, for labs and ultrasound of the liver.   Continue Aciphex for GERD.

## 2019-03-16 NOTE — Progress Notes (Signed)
Primary Care Physician: Rosita Fire, MD  Primary Gastroenterologist:  Garfield Cornea, MD   Chief Complaint  Patient presents with  . Fatty Liver f/u  . Gastroesophageal Reflux    HPI: Wendy Kramer is a 57 y.o. female here for follow-up.  She was last seen in July.  History of GERD and fatty liver.  She was seen in the ED last week due to a fall/syncope.  CT head/cervical spine showed posterior scalp hematoma on the right.  No acute intracranial abnormality.  Cervical spondylosis.  CT angio chest was negative for PE, notable cardiomegaly and CAD, hepatic steatosis with mildly nodular contour of the liver surface.  Borderline splenomegaly.  Findings concerning for cirrhosis with possible developing portal hypertension.  Labs showed AST of 62, ALT 48, creatinine 1.25.  Troponin I negative x2.  Hemoglobin 15.7, platelets 221,000, white blood cell count 10,000.  Ultrasound August 2019, mildly increased hepatic echotexture compatible with fatty liver.  Smooth border. Spleen normal.  Colonoscopy December 2019, diverticulosis in the sigmoid colon and descending colon.  3 polyps removed, 2 hyperplastic and one sessile serrated polyp.  Repeat colonoscopy in 3 years.  EGD December 2019 with erosive reflux esophagitis, small hiatal hernia, erosive gastropathy.  Pathology with reactive gastropathy.  No H. Pylori.  Currently out of Plavix.  Was off of it when she fell out and hit her head. Also has stopped Metformin trying to wean herself off diabetes medications.  In the ER her glucose was 300. Had been on fluid pill with her BP pill. Urinating a lot. Heart rate up.  When EMS arrived after her syncopal event, her systolic pressures was in the 90s.  Patient reports her fluid pill was held by ER.  Four years ago when she was still in [place], abnormal LFTs and fatty liver on u/s.  Was able to better control her diabetes lose a little weight, numbers normalized.  Back in June A1c was 8.9, Dr. Legrand Rams  have restarted her Metformin but she states she has not been on it for some time.  Realizes that she is unable to control her diabetes no longer with her diet.  Fasting sugars over 200.   She denies any abdominal pain.  She has some fullness in the left upper quadrant.  Appetite remains good.  No vomiting.  Heartburn well controlled on AcipHex.  No melena or rectal bleeding.  Bowel movements regular, [place] 5 baseline for her.  States that she was advised to follow-up with cardiology after recent syncopal event.  She was having trouble making appointment, was not sure she had a right phone number.  Correct phone number confirmed and provided to patient today.  Patient sees Dr. Legrand Rams on Thursday.  Anticipates more blood work.  Current Outpatient Medications  Medication Sig Dispense Refill  . acetaminophen (TYLENOL) 500 MG tablet Take 500 mg by mouth every 6 (six) hours as needed.    Marland Kitchen albuterol (PROVENTIL HFA;VENTOLIN HFA) 108 (90 Base) MCG/ACT inhaler Inhale 1-2 puffs into the lungs every 6 (six) hours as needed for wheezing or shortness of breath.    . ALPRAZolam (XANAX) 0.5 MG tablet Take 0.25 mg by mouth daily as needed for anxiety.     . budesonide-formoterol (SYMBICORT) 80-4.5 MCG/ACT inhaler Inhale 1 puff into the lungs 2 (two) times daily.     . Cholecalciferol (VITAMIN D3) 50 MCG (2000 UT) TABS Take 2,000 Units by mouth daily.     . cyclobenzaprine (FLEXERIL) 5 MG tablet Take  5 mg by mouth daily as needed for muscle spasms.   0  . fluticasone (FLONASE) 50 MCG/ACT nasal spray Place 1 spray into both nostrils daily as needed for allergies or rhinitis.     Marland Kitchen lisinopril (ZESTRIL) 10 MG tablet Take 10 mg by mouth daily.    . RABEprazole (ACIPHEX) 20 MG tablet TAKE 1 TABLET BY MOUTH EVERY DAY 90 tablet 3  . traMADol (ULTRAM) 50 MG tablet Take 50 mg by mouth every 6 (six) hours as needed for moderate pain.   3  . clopidogrel (PLAVIX) 75 MG tablet Take 1 tablet (75 mg total) by mouth daily.  (Patient not taking: Reported on 03/16/2019) 30 tablet 0   No current facility-administered medications for this visit.    Allergies as of 03/16/2019 - Review Complete 03/16/2019  Allergen Reaction Noted  . Asa [aspirin] Anaphylaxis 09/24/2016  . Augmentin [amoxicillin-pot clavulanate] Other (See Comments) 09/24/2016  . Levaquin [levofloxacin in d5w] Itching and Other (See Comments) 09/24/2016  . Lovenox [enoxaparin sodium] Other (See Comments) 09/24/2016    ROS:  General: Negative for anorexia, weight loss, fever, chills, fatigue, weakness.  Intermittent dizziness ENT: Negative for hoarseness, difficulty swallowing , nasal congestion. CV: Negative for chest pain, angina, palpitations, dyspnea on exertion, peripheral edema.  Respiratory: Negative for dyspnea at rest, dyspnea on exertion, cough, sputum, wheezing.  GI: See history of present illness. GU:  Negative for dysuria, hematuria, urinary incontinence, urinary frequency, nocturnal urination.  Endo: Negative for unusual weight change.    Physical Examination:   BP (!) 160/84   Pulse 88   Temp (!) 97.2 F (36.2 C) (Oral)   Ht 5\' 4"  (1.626 m)   Wt 249 lb 12.8 oz (113.3 kg)   LMP 09/06/2016   BMI 42.88 kg/m   General: Well-nourished, well-developed in no acute distress.  Eyes: No icterus. Mouth: Oropharyngeal mucosa moist and pink , no lesions erythema or exudate. Lungs: Clear to auscultation bilaterally.  Heart: Regular rate and rhythm, no murmurs rubs or gallops.  Abdomen: Bowel sounds are normal, nontender, nondistended, no hepatosplenomegaly or masses, no abdominal bruits or hernia , no rebound or guarding.   Extremities: No lower extremity edema. No clubbing or deformities. Neuro: Alert and oriented x 4   Skin: Warm and dry, no jaundice.   Psych: Alert and cooperative, normal mood and affect.  Labs:  Lab Results  Component Value Date   CREATININE 1.30 (H) 03/10/2019   BUN 19 03/10/2019   NA 135 03/10/2019   K  4.5 03/10/2019   CL 97 (L) 03/10/2019   CO2 24 03/10/2019   Lab Results  Component Value Date   ALT 48 (H) 03/10/2019   AST 62 (H) 03/10/2019   ALKPHOS 82 03/10/2019   BILITOT 0.8 03/10/2019   Lab Results  Component Value Date   WBC 10.0 03/10/2019   HGB 16.7 (H) 03/10/2019   HCT 49.0 (H) 03/10/2019   MCV 95.1 03/10/2019   PLT 221 03/10/2019    Imaging Studies: CT Head Wo Contrast  Result Date: 03/10/2019 CLINICAL DATA:  Syncope. Fall with head injury. On anticoagulation. EXAM: CT HEAD WITHOUT CONTRAST CT CERVICAL SPINE WITHOUT CONTRAST TECHNIQUE: Multidetector CT imaging of the head and cervical spine was performed following the standard protocol without intravenous contrast. Multiplanar CT image reconstructions of the cervical spine were also generated. COMPARISON:  CT head 06/02/2017 FINDINGS: CT HEAD FINDINGS Brain: No evidence of acute infarction, hemorrhage, hydrocephalus, extra-axial collection or mass lesion/mass effect. Benign-appearing cyst left basal  ganglia appear unchanged Vascular: Negative for hyperdense vessel Skull: Posterior scalp hematoma on the right. Negative for skull fracture. Sinuses/Orbits: Negative Other: None CT CERVICAL SPINE FINDINGS Alignment: Mild anterolisthesis C3-4 and C4-5. Cervical kyphosis. Skull base and vertebrae: Negative for fracture Soft tissues and spinal canal: Negative for soft tissue swelling or mass. Atherosclerotic calcification in the carotid artery bilaterally. Disc levels: Multilevel disc degeneration and spurring most prominent at C5-6 and C6-7 causing mild spinal stenosis. Upper chest: Lung apices clear bilaterally Other: None IMPRESSION: 1. Posterior scalp hematoma on the right. No acute intracranial abnormality. 2. Cervical spondylosis. Negative for cervical spine fracture. Electronically Signed   By: Franchot Gallo M.D.   On: 03/10/2019 14:46   CT Angio Chest PE W and/or Wo Contrast  Result Date: 03/10/2019 CLINICAL DATA:  Dizziness.  Positive D-dimer. EXAM: CT ANGIOGRAPHY CHEST WITH CONTRAST TECHNIQUE: Multidetector CT imaging of the chest was performed using the standard protocol during bolus administration of intravenous contrast. Multiplanar CT image reconstructions and MIPs were obtained to evaluate the vascular anatomy. CONTRAST:  116mL OMNIPAQUE IOHEXOL 350 MG/ML SOLN COMPARISON:  None. FINDINGS: Cardiovascular: Contrast injection is sufficient to demonstrate satisfactory opacification of the pulmonary arteries to the segmental level. There is no pulmonary embolus. The main pulmonary artery is within normal limits for size. There is no CT evidence of acute right heart strain. Atherosclerotic changes are noted of the thoracic aorta without evidence for an aneurysm. Heart size is enlarged. Coronary artery calcifications are noted. There is no significant pericardial effusion. Mediastinum/Nodes: --No mediastinal or hilar lymphadenopathy. --No axillary lymphadenopathy. --No supraclavicular lymphadenopathy. --Normal thyroid gland. --The esophagus is unremarkable Lungs/Pleura: There is a calcified granuloma in the right upper lobe. There is no pneumothorax. No large pleural effusion. The trachea is unremarkable. There is some atelectasis versus scarring in the lingula. Upper Abdomen: There is hepatic steatosis. There is a mildly nodular contour of the liver surface. The patient is status post prior cholecystectomy. The spleen is borderline enlarged. Musculoskeletal: No chest wall abnormality. No acute or significant osseous findings. Review of the MIP images confirms the above findings. IMPRESSION: 1. There is no evidence for acute pulmonary embolus or aortic dissection. 2. Coronary artery disease and cardiomegaly. 3. Hepatic steatosis with a mildly nodular contour of the liver surface. Borderline splenomegaly. Findings are concerning for cirrhosis with possible developing portal hypertension. Aortic Atherosclerosis (ICD10-I70.0). Electronically  Signed   By: Constance Holster M.D.   On: 03/10/2019 19:07   CT Cervical Spine Wo Contrast  Result Date: 03/10/2019 CLINICAL DATA:  Syncope. Fall with head injury. On anticoagulation. EXAM: CT HEAD WITHOUT CONTRAST CT CERVICAL SPINE WITHOUT CONTRAST TECHNIQUE: Multidetector CT imaging of the head and cervical spine was performed following the standard protocol without intravenous contrast. Multiplanar CT image reconstructions of the cervical spine were also generated. COMPARISON:  CT head 06/02/2017 FINDINGS: CT HEAD FINDINGS Brain: No evidence of acute infarction, hemorrhage, hydrocephalus, extra-axial collection or mass lesion/mass effect. Benign-appearing cyst left basal ganglia appear unchanged Vascular: Negative for hyperdense vessel Skull: Posterior scalp hematoma on the right. Negative for skull fracture. Sinuses/Orbits: Negative Other: None CT CERVICAL SPINE FINDINGS Alignment: Mild anterolisthesis C3-4 and C4-5. Cervical kyphosis. Skull base and vertebrae: Negative for fracture Soft tissues and spinal canal: Negative for soft tissue swelling or mass. Atherosclerotic calcification in the carotid artery bilaterally. Disc levels: Multilevel disc degeneration and spurring most prominent at C5-6 and C6-7 causing mild spinal stenosis. Upper chest: Lung apices clear bilaterally Other: None IMPRESSION: 1. Posterior scalp hematoma  on the right. No acute intracranial abnormality. 2. Cervical spondylosis. Negative for cervical spine fracture. Electronically Signed   By: Franchot Gallo M.D.   On: 03/10/2019 14:46   DG Chest Portable 1 View  Result Date: 03/10/2019 CLINICAL DATA:  Syncopal episode today with a fall and blow to the head. EXAM: PORTABLE CHEST 1 VIEW COMPARISON:  None. FINDINGS: Lungs are clear. Heart size is normal. No pneumothorax or pleural effusion. No acute bony abnormality. Patient status post right rotator cuff repair. IMPRESSION: No acute disease. Electronically Signed   By: Inge Rise M.D.   On: 03/10/2019 13:51

## 2019-03-29 NOTE — Progress Notes (Signed)
CARDIOLOGY CONSULT NOTE       Patient ID: Wendy Kramer MRN: [id number] DOB/AGE: [id number] y.o.  Admit date: (Not on file) Referring Physician: Nuala Alpha PA-C  Primary Physician: Rosita Fire, MD Primary Cardiologist: New Reason for Consultation: Syncope  Active Problems:   * No active hospital problems. *   HPI:  58 y.o. with DM, murmur , HtN OSA and asthma Seen in [facility] ER 03/10/19 with LOC. She was in [place] with multiple coats on and felt lightheaded She went to check out counter and sat down. ? Ended up on floor No antecedent chest pain , palpitations or dyspnea. After fall had headache and neck pain EMS noted systolic BP 90 mmHg  She had felt well prior to going to [employer] She had stopped her plavix. ? Previous TIA with history of PFO. She also had stopped her DM meds and noted increased urination Also had increased her HCTZ on her own last week before event CT head showed only scalp hematoma on right. C spine ok, Not postural in ER Telemetry with no arrhythmia Troponin negative Hct elevated 50.3 CTA with no PE noted CAD and CE.    Echo from July 2018 with no valve disease small PFO by bubble study   EF 60-65%   Moved from [place] 05/2016 Lives with daughter Spends time with grand kids including two with autism  She had a son with transposition -> transplant since died  Has chronic edema due to obesity and varicose veins   No chest pain or history of CAD Had multiple previous stress tests negative Describes dobutamine for one and did not like it Anaphylaxis with aspirin so has been on plavix    ROS All other systems reviewed and negative except as noted above  Past Medical History:  Diagnosis Date  . Arthritis   . Cardiac murmur   . Cervical spine disease   . Diabetes mellitus without complication (HCC)    diet controlled  . Exogenous hyperlipidemia   . History of kidney stones   . HTN (hypertension)   . Morbid obesity due to excess calories ([place])   .  Other chronic pain   . Panic disorder   . Patent foramen ovale    TEE 2018  . Prediabetes   . Sleep apnea   . Stroke [facility])    "mini-stroke" no deficits from this.  Marland Kitchen Unspecified asthma with (acute) exacerbation   . Vitamin D deficiency     Family History  Problem Relation Age of Onset  . Breast cancer Maternal Aunt   . Colon cancer Other        maternal great grandfather  . Liver disease Brother        Fatty liver  . Hypertension Mother   . Hypertension Father     Social History   Socioeconomic History  . Marital status: Legally Separated    Spouse name: Not on file  . Number of children: Not on file  . Years of education: Not on file  . Highest education level: Not on file  Occupational History  . Not on file  Tobacco Use  . Smoking status: Current Every Day Smoker    Packs/day: 0.50    Years: 40.00    Pack years: 20.00    Types: Cigarettes  . Smokeless tobacco: Never Used  Substance and Sexual Activity  . Alcohol use: No  . Drug use: No  . Sexual activity: Not Currently    Birth control/protection: Post-menopausal  Other  Topics Concern  . Not on file  Social History Narrative  . Not on file   Social Determinants of Health   Financial Resource Strain:   . Difficulty of Paying Living Expenses: Not on file  Food Insecurity:   . Worried About Charity fundraiser in the Last Year: Not on file  . Ran Out of Food in the Last Year: Not on file  Transportation Needs:   . Lack of Transportation (Medical): Not on file  . Lack of Transportation (Non-Medical): Not on file  Physical Activity:   . Days of Exercise per Week: Not on file  . Minutes of Exercise per Session: Not on file  Stress:   . Feeling of Stress : Not on file  Social Connections:   . Frequency of Communication with Friends and Family: Not on file  . Frequency of Social Gatherings with Friends and Family: Not on file  . Attends Religious Services: Not on file  . Active Member of Clubs or  Organizations: Not on file  . Attends Archivist Meetings: Not on file  . Marital Status: Not on file  Intimate Partner Violence:   . Fear of Current or Ex-Partner: Not on file  . Emotionally Abused: Not on file  . Physically Abused: Not on file  . Sexually Abused: Not on file    Past Surgical History:  Procedure Laterality Date  . BIOPSY  03/05/2018   Procedure: BIOPSY;  Surgeon: Daneil Dolin, MD;  Location: [facility];  Service: Endoscopy;;  gastric  . CHOLECYSTECTOMY    . COLONOSCOPY WITH PROPOFOL N/A 03/05/2018   Dr. Gala Romney: Diverticulosis in the sigmoid and descending colon.  3 colon polyps removed, 2 hyperplastic and one sessile serrated polyp.  Next colonoscopy 3 years.  . ESOPHAGOGASTRODUODENOSCOPY (EGD) WITH PROPOFOL N/A 03/05/2018   Dr. Gala Romney: Erosive reflux esophagitis, small hiatal hernia, erosive gastropathy with reactive gastropathy on biopsy.  No H. pylori.  Marland Kitchen HERNIA REPAIR     umbilical  . POLYPECTOMY  03/05/2018   Procedure: POLYPECTOMY;  Surgeon: Daneil Dolin, MD;  Location: [facility];  Service: Endoscopy;;  colon  . SHOULDER ARTHROSCOPY Right   . TEE WITHOUT CARDIOVERSION  2018   patent foramen ovale  . TONSILLECTOMY AND ADENOIDECTOMY    . TUBAL LIGATION          Physical Exam: Blood pressure (!) 167/82, pulse 85, temperature (!) 96.8 F (36 C), temperature source Temporal, height 5\' 4"  (1.626 m), last menstrual period 09/06/2016, SpO2 98 %.    Affect appropriate Obese female  HEENT: normal Neck supple with no adenopathy JVP normal no bruits no thyromegaly Lungs clear with no wheezing and good diaphragmatic motion Heart:  S1/S2 no murmur, no rub, gallop or click PMI normal Abdomen: benighn, BS positve, no tenderness, no AAA no bruit.  No HSM or HJR Distal pulses intact with no bruits Varicosities with mild edema  Neuro non-focal Skin warm and dry No muscular weakness  Labs:   Lab Results  Component Value Date   WBC 10.0  03/10/2019   HGB 16.7 (H) 03/10/2019   HCT 49.0 (H) 03/10/2019   MCV 95.1 03/10/2019   PLT 221 03/10/2019      Radiology: CT Head Wo Contrast  Result Date: 03/10/2019 CLINICAL DATA:  Syncope. Fall with head injury. On anticoagulation. EXAM: CT HEAD WITHOUT CONTRAST CT CERVICAL SPINE WITHOUT CONTRAST TECHNIQUE: Multidetector CT imaging of the head and cervical spine was performed following the standard protocol  without intravenous contrast. Multiplanar CT image reconstructions of the cervical spine were also generated. COMPARISON:  CT head 06/02/2017 FINDINGS: CT HEAD FINDINGS Brain: No evidence of acute infarction, hemorrhage, hydrocephalus, extra-axial collection or mass lesion/mass effect. Benign-appearing cyst left basal ganglia appear unchanged Vascular: Negative for hyperdense vessel Skull: Posterior scalp hematoma on the right. Negative for skull fracture. Sinuses/Orbits: Negative Other: None CT CERVICAL SPINE FINDINGS Alignment: Mild anterolisthesis C3-4 and C4-5. Cervical kyphosis. Skull base and vertebrae: Negative for fracture Soft tissues and spinal canal: Negative for soft tissue swelling or mass. Atherosclerotic calcification in the carotid artery bilaterally. Disc levels: Multilevel disc degeneration and spurring most prominent at C5-6 and C6-7 causing mild spinal stenosis. Upper chest: Lung apices clear bilaterally Other: None IMPRESSION: 1. Posterior scalp hematoma on the right. No acute intracranial abnormality. 2. Cervical spondylosis. Negative for cervical spine fracture. Electronically Signed   By: Franchot Gallo M.D.   On: 03/10/2019 14:46   CT Angio Chest PE W and/or Wo Contrast  Result Date: 03/10/2019 CLINICAL DATA:  Dizziness. Positive D-dimer. EXAM: CT ANGIOGRAPHY CHEST WITH CONTRAST TECHNIQUE: Multidetector CT imaging of the chest was performed using the standard protocol during bolus administration of intravenous contrast. Multiplanar CT image reconstructions and MIPs were  obtained to evaluate the vascular anatomy. CONTRAST:  166mL OMNIPAQUE IOHEXOL 350 MG/ML SOLN COMPARISON:  None. FINDINGS: Cardiovascular: Contrast injection is sufficient to demonstrate satisfactory opacification of the pulmonary arteries to the segmental level. There is no pulmonary embolus. The main pulmonary artery is within normal limits for size. There is no CT evidence of acute right heart strain. Atherosclerotic changes are noted of the thoracic aorta without evidence for an aneurysm. Heart size is enlarged. Coronary artery calcifications are noted. There is no significant pericardial effusion. Mediastinum/Nodes: --No mediastinal or hilar lymphadenopathy. --No axillary lymphadenopathy. --No supraclavicular lymphadenopathy. --Normal thyroid gland. --The esophagus is unremarkable Lungs/Pleura: There is a calcified granuloma in the right upper lobe. There is no pneumothorax. No large pleural effusion. The trachea is unremarkable. There is some atelectasis versus scarring in the lingula. Upper Abdomen: There is hepatic steatosis. There is a mildly nodular contour of the liver surface. The patient is status post prior cholecystectomy. The spleen is borderline enlarged. Musculoskeletal: No chest wall abnormality. No acute or significant osseous findings. Review of the MIP images confirms the above findings. IMPRESSION: 1. There is no evidence for acute pulmonary embolus or aortic dissection. 2. Coronary artery disease and cardiomegaly. 3. Hepatic steatosis with a mildly nodular contour of the liver surface. Borderline splenomegaly. Findings are concerning for cirrhosis with possible developing portal hypertension. Aortic Atherosclerosis (ICD10-I70.0). Electronically Signed   By: Constance Holster M.D.   On: 03/10/2019 19:07   CT Cervical Spine Wo Contrast  Result Date: 03/10/2019 CLINICAL DATA:  Syncope. Fall with head injury. On anticoagulation. EXAM: CT HEAD WITHOUT CONTRAST CT CERVICAL SPINE WITHOUT  CONTRAST TECHNIQUE: Multidetector CT imaging of the head and cervical spine was performed following the standard protocol without intravenous contrast. Multiplanar CT image reconstructions of the cervical spine were also generated. COMPARISON:  CT head 06/02/2017 FINDINGS: CT HEAD FINDINGS Brain: No evidence of acute infarction, hemorrhage, hydrocephalus, extra-axial collection or mass lesion/mass effect. Benign-appearing cyst left basal ganglia appear unchanged Vascular: Negative for hyperdense vessel Skull: Posterior scalp hematoma on the right. Negative for skull fracture. Sinuses/Orbits: Negative Other: None CT CERVICAL SPINE FINDINGS Alignment: Mild anterolisthesis C3-4 and C4-5. Cervical kyphosis. Skull base and vertebrae: Negative for fracture Soft tissues and spinal canal: Negative for soft  tissue swelling or mass. Atherosclerotic calcification in the carotid artery bilaterally. Disc levels: Multilevel disc degeneration and spurring most prominent at C5-6 and C6-7 causing mild spinal stenosis. Upper chest: Lung apices clear bilaterally Other: None IMPRESSION: 1. Posterior scalp hematoma on the right. No acute intracranial abnormality. 2. Cervical spondylosis. Negative for cervical spine fracture. Electronically Signed   By: Franchot Gallo M.D.   On: 03/10/2019 14:46   DG Chest Portable 1 View  Result Date: 03/10/2019 CLINICAL DATA:  Syncopal episode today with a fall and blow to the head. EXAM: PORTABLE CHEST 1 VIEW COMPARISON:  None. FINDINGS: Lungs are clear. Heart size is normal. No pneumothorax or pleural effusion. No acute bony abnormality. Patient status post right rotator cuff repair. IMPRESSION: No acute disease. Electronically Signed   By: Inge Rise M.D.   On: 03/10/2019 13:51    EKG 03/11/19 SR rate 86 normal    ASSESSMENT AND PLAN:   1. Syncope:  Likely related to glucosuria and increased diuretic use with elevated CR 1.25 and improvement with hydration in ER. No evidence of  arrhythmia on telemetry and normal ECG Encouraged her not to change medications on her own no need for stress testing  2. PFO:  No focal neurologic deficits resume plavix for previous TIA will update echo  3. DM:  Needs f/u A1c previously on glucophage but patient stopped on her own f/u primary  4. Elevated Hct:  ? Related to dehydration needs f/u with primary and consider heme referral with RBC mass Likely related to smoking    Echo for PFO/TIA only stop  plavix if no shunt seen given her DM and risk for vascular disease and inability to take ASA   Signed: Jenkins Rouge 04/05/2019, 10:52 AM

## 2019-04-01 LAB — COMPREHENSIVE METABOLIC PANEL
AG Ratio: 1.5 (calc) (ref 1.0–2.5)
ALT: 39 U/L — ABNORMAL HIGH (ref 6–29)
AST: 41 U/L — ABNORMAL HIGH (ref 10–35)
Albumin: 4.1 g/dL (ref 3.6–5.1)
Alkaline phosphatase (APISO): 74 U/L (ref 37–153)
BUN: 13 mg/dL (ref 7–25)
CO2: 28 mmol/L (ref 20–32)
Calcium: 9.4 mg/dL (ref 8.6–10.4)
Chloride: 101 mmol/L (ref 98–110)
Creat: 0.84 mg/dL (ref 0.50–1.05)
Globulin: 2.7 g/dL (calc) (ref 1.9–3.7)
Glucose, Bld: 184 mg/dL — ABNORMAL HIGH (ref 65–99)
Potassium: 4.4 mmol/L (ref 3.5–5.3)
Sodium: 138 mmol/L (ref 135–146)
Total Bilirubin: 0.4 mg/dL (ref 0.2–1.2)
Total Protein: 6.8 g/dL (ref 6.1–8.1)

## 2019-04-01 LAB — IRON,TIBC AND FERRITIN PANEL
%SAT: 25 % (calc) (ref 16–45)
Ferritin: 117 ng/mL (ref 16–232)
Iron: 81 ug/dL (ref 45–160)
TIBC: 318 mcg/dL (calc) (ref 250–450)

## 2019-04-01 LAB — ANTI-NUCLEAR AB-TITER (ANA TITER)
ANA TITER: 1:80 {titer} — ABNORMAL HIGH
ANA Titer 1: 1:80 {titer} — ABNORMAL HIGH

## 2019-04-01 LAB — IGG, IGA, IGM
IgG (Immunoglobin G), Serum: 807 mg/dL (ref 600–1640)
IgM, Serum: 87 mg/dL (ref 50–300)
Immunoglobulin A: 265 mg/dL (ref 47–310)

## 2019-04-01 LAB — HEPATITIS C ANTIBODY
Hepatitis C Ab: NONREACTIVE
SIGNAL TO CUT-OFF: 0.02 (ref <1.00)

## 2019-04-01 LAB — HEPATITIS A ANTIBODY, TOTAL: Hepatitis A AB,Total: NONREACTIVE

## 2019-04-01 LAB — PROTIME-INR
INR: 0.9
Prothrombin Time: 10 s (ref 9.0–11.5)

## 2019-04-01 LAB — HEPATITIS B SURFACE ANTIGEN: Hepatitis B Surface Ag: NONREACTIVE

## 2019-04-01 LAB — ANA: Anti Nuclear Antibody (ANA): POSITIVE — AB

## 2019-04-01 LAB — HEPATITIS B SURFACE ANTIBODY,QUALITATIVE: Hep B S Ab: NONREACTIVE

## 2019-04-01 LAB — MITOCHONDRIAL ANTIBODIES: Mitochondrial M2 Ab, IgG: <=20 U

## 2019-04-01 LAB — HEPATITIS B CORE ANTIBODY, TOTAL: Hep B Core Total Ab: NONREACTIVE

## 2019-04-05 ENCOUNTER — Ambulatory Visit (INDEPENDENT_AMBULATORY_CARE_PROVIDER_SITE_OTHER): Payer: Medicare Other | Admitting: Cardiovascular Disease

## 2019-04-05 ENCOUNTER — Other Ambulatory Visit: Payer: Self-pay | Admitting: *Deleted

## 2019-04-05 ENCOUNTER — Encounter: Payer: Self-pay | Admitting: Cardiovascular Disease

## 2019-04-05 ENCOUNTER — Other Ambulatory Visit: Payer: Self-pay

## 2019-04-05 ENCOUNTER — Telehealth: Payer: Self-pay | Admitting: Internal Medicine

## 2019-04-05 VITALS — BP 167/82 | HR 85 | Temp 96.8°F | Ht 64.0 in | Wt 251.0 lb

## 2019-04-05 DIAGNOSIS — R55 Syncope and collapse: Secondary | ICD-10-CM

## 2019-04-05 DIAGNOSIS — E119 Type 2 diabetes mellitus without complications: Secondary | ICD-10-CM | POA: Diagnosis not present

## 2019-04-05 DIAGNOSIS — Z8673 Personal history of transient ischemic attack (TIA), and cerebral infarction without residual deficits: Secondary | ICD-10-CM | POA: Diagnosis not present

## 2019-04-05 DIAGNOSIS — R768 Other specified abnormal immunological findings in serum: Secondary | ICD-10-CM

## 2019-04-05 DIAGNOSIS — Q211 Atrial septal defect: Secondary | ICD-10-CM | POA: Diagnosis not present

## 2019-04-05 NOTE — Telephone Encounter (Signed)
Pt was returning a call from [facility]. She said to call around noon today, because she was at another appointment at the moment. [phone]

## 2019-04-05 NOTE — Telephone Encounter (Signed)
Spoke with pt and she is aware of results and referral.  Pt voiced understanding.

## 2019-04-05 NOTE — Patient Instructions (Signed)
Medication Instructions:  Your physician recommends that you continue on your current medications as directed. Please refer to the Current Medication list given to you today.   Labwork: none  Testing/Procedures: Your physician has requested that you have an echocardiogram. Echocardiography is a painless test that uses sound waves to create images of your heart. It provides your doctor with information about the size and shape of your heart and how well your heart's chambers and valves are working. This procedure takes approximately one hour. There are no restrictions for this procedure.    Follow-Up: Your physician recommends that you schedule a follow-up appointment in: as needed    Any Other Special Instructions Will Be Listed Below (If Applicable).     If you need a refill on your cardiac medications before your next appointment, please call your pharmacy.   

## 2019-04-08 ENCOUNTER — Other Ambulatory Visit: Payer: Self-pay | Admitting: Cardiovascular Disease

## 2019-04-08 ENCOUNTER — Other Ambulatory Visit: Payer: Self-pay

## 2019-04-08 ENCOUNTER — Ambulatory Visit (HOSPITAL_COMMUNITY)
Admission: RE | Admit: 2019-04-08 | Discharge: 2019-04-08 | Disposition: A | Discharge status: Home / Self Care | Payer: Medicare Other | Source: Ambulatory Visit | Attending: Cardiovascular Disease | Admitting: Cardiovascular Disease

## 2019-04-08 DIAGNOSIS — E119 Type 2 diabetes mellitus without complications: Secondary | ICD-10-CM | POA: Diagnosis not present

## 2019-04-08 DIAGNOSIS — I10 Essential (primary) hypertension: Secondary | ICD-10-CM | POA: Insufficient documentation

## 2019-04-08 DIAGNOSIS — Z8673 Personal history of transient ischemic attack (TIA), and cerebral infarction without residual deficits: Secondary | ICD-10-CM

## 2019-04-08 DIAGNOSIS — E785 Hyperlipidemia, unspecified: Secondary | ICD-10-CM | POA: Diagnosis not present

## 2019-04-08 NOTE — Progress Notes (Signed)
*  PRELIMINARY RESULTS* Echocardiogram 2D Echocardiogram has been performed with a saline bubble study.  Wendy Kramer 04/08/2019, 12:57 PM

## 2019-04-12 ENCOUNTER — Ambulatory Visit: Payer: Medicare Other | Admitting: Gastroenterology

## 2019-04-16 ENCOUNTER — Telehealth: Payer: Self-pay

## 2019-04-16 NOTE — Telephone Encounter (Signed)
See referral in the notes they are working on scheduling her

## 2019-04-16 NOTE — Telephone Encounter (Signed)
PT left Vm that she was was supposed to have a referral to Rheumatology and had not heard anything. I told her the referral was sent on 04/05/2019 and she should be hearing from them within the next week. Will forward to [place] who sent the referral for FYI.

## 2019-04-18 DIAGNOSIS — E119 Type 2 diabetes mellitus without complications: Secondary | ICD-10-CM | POA: Diagnosis not present

## 2019-04-18 DIAGNOSIS — I10 Essential (primary) hypertension: Secondary | ICD-10-CM | POA: Diagnosis not present

## 2019-04-22 DIAGNOSIS — M542 Cervicalgia: Secondary | ICD-10-CM | POA: Diagnosis not present

## 2019-04-22 DIAGNOSIS — I679 Cerebrovascular disease, unspecified: Secondary | ICD-10-CM | POA: Diagnosis not present

## 2019-04-22 DIAGNOSIS — M79601 Pain in right arm: Secondary | ICD-10-CM | POA: Diagnosis not present

## 2019-04-22 DIAGNOSIS — M545 Low back pain: Secondary | ICD-10-CM | POA: Diagnosis not present

## 2019-04-27 NOTE — Progress Notes (Deleted)
Office Visit Note  Patient: Wendy Kramer             Date of Birth: September 15, 1960           MRN: [id number]             PCP: Rosita Fire, MD Referring: Rosita Fire, MD Visit Date: 04/30/2019 Occupation: @GUAROCC @  Subjective:  No chief complaint on file.   History of Present Illness: Wendy Kramer is a 59 y.o. female ***   Activities of Daily Living:  Patient reports morning stiffness for *** {minute/hour:19697}.   Patient {ACTIONS;DENIES/REPORTS:21021675::"Denies"} nocturnal pain.  Difficulty dressing/grooming: {ACTIONS;DENIES/REPORTS:21021675::"Denies"} Difficulty climbing stairs: {ACTIONS;DENIES/REPORTS:21021675::"Denies"} Difficulty getting out of chair: {ACTIONS;DENIES/REPORTS:21021675::"Denies"} Difficulty using hands for taps, buttons, cutlery, and/or writing: {ACTIONS;DENIES/REPORTS:21021675::"Denies"}  No Rheumatology ROS completed.   PMFS History:  Patient Active Problem List   Diagnosis Date Noted  . Abnormal LFTs 03/16/2019  . Diabetes mellitus without complication ([place])   . Positive colorectal cancer screening using Cologuard test 12/24/2017  . Abdominal pain, epigastric 12/24/2017  . GERD (gastroesophageal reflux disease) 12/24/2017  . Fatty liver 12/24/2017    Past Medical History:  Diagnosis Date  . Arthritis   . Cardiac murmur   . Cervical spine disease   . Diabetes mellitus without complication (HCC)    diet controlled  . Exogenous hyperlipidemia   . History of kidney stones   . HTN (hypertension)   . Morbid obesity due to excess calories ([place])   . Other chronic pain   . Panic disorder   . Patent foramen ovale    TEE 2018  . Prediabetes   . Sleep apnea   . Stroke [facility])    "mini-stroke" no deficits from this.  Marland Kitchen Unspecified asthma with (acute) exacerbation   . Vitamin D deficiency     Family History  Problem Relation Age of Onset  . Breast cancer Maternal Aunt   . Colon cancer Other        maternal great grandfather  . Liver  disease Brother        Fatty liver  . Hypertension Mother   . Hypertension Father    Past Surgical History:  Procedure Laterality Date  . BIOPSY  03/05/2018   Procedure: BIOPSY;  Surgeon: Daneil Dolin, MD;  [facility];  Service: Endoscopy;;  gastric  . CHOLECYSTECTOMY    . COLONOSCOPY WITH PROPOFOL N/A 03/05/2018   Dr. Gala Romney: Diverticulosis in the sigmoid and descending colon.  3 colon polyps removed, 2 hyperplastic and one sessile serrated polyp.  Next colonoscopy 3 years.  . ESOPHAGOGASTRODUODENOSCOPY (EGD) WITH PROPOFOL N/A 03/05/2018   Dr. Gala Romney: Erosive reflux esophagitis, small hiatal hernia, erosive gastropathy with reactive gastropathy on biopsy.  No H. pylori.  Marland Kitchen HERNIA REPAIR     umbilical  . POLYPECTOMY  03/05/2018   Procedure: POLYPECTOMY;  Surgeon: Daneil Dolin, MD;  [facility];  Service: Endoscopy;;  colon  . SHOULDER ARTHROSCOPY Right   . TEE WITHOUT CARDIOVERSION  2018   patent foramen ovale  . TONSILLECTOMY AND ADENOIDECTOMY    . TUBAL LIGATION     Social History   Social History Narrative  . Not on file    There is no immunization history on file for this patient.   Objective: Vital Signs: LMP 09/06/2016    Physical Exam   Musculoskeletal Exam: ***  CDAI Exam: CDAI Score: - Patient Global: -; Provider Global: - Swollen: -; Tender: - Joint Exam 04/30/2019   No joint exam  has been documented for this visit   There is currently no information documented on the homunculus. Go to the Rheumatology activity and complete the homunculus joint exam.  Investigation: No additional findings.  Imaging: ECHOCARDIOGRAM COMPLETE BUBBLE STUDY  Result Date: 04/08/2019   ECHOCARDIOGRAM REPORT   Patient Name:   [facility] Date of Exam: 04/08/2019 Medical Rec #:  [id number]     Height:       64.0 in Accession #:    [id number]    Weight:       251.0 lb Date of Birth:  [birth year]     BSA:          2.15 m Patient Age:    28 years      BP:            188/97 mmHg Patient Gender: F             HR:           78 bpm. Exam Location:  [facility] Procedure: 2D Echo, Cardiac Doppler and Color Doppler Indications:    Z86.73 (ICD-10-CM) - Hx-TIA (transient ischemic attack) /                 Re-eval PFO  History:        Patient has prior history of Echocardiogram examinations, most                 recent 10/04/2016. TIA; Risk Factors:Hypertension, Diabetes and                 Dyslipidemia. Patent foramen ovale (From Hx).  Sonographer:    Alvino Chapel RCS Referring Phys: [place]  1. Left ventricular ejection fraction, by visual estimation, is 60 to 65%. The left ventricle has normal function. There is no left ventricular hypertrophy.  2. Left ventricular diastolic parameters are consistent with Grade I diastolic dysfunction (impaired relaxation).  3. The left ventricle has no regional wall motion abnormalities.  4. Global right ventricle has normal systolic function.The right ventricular size is normal. No increase in right ventricular wall thickness.  5. Left atrial size was normal.  6. Right atrial size was normal.  7. The mitral valve is normal in structure. No evidence of mitral valve regurgitation.  8. The tricuspid valve is normal in structure.  9. The aortic valve is tricuspid. Aortic valve regurgitation is not visualized. Mild aortic valve sclerosis without stenosis. 10. The pulmonic valve was normal in structure. Pulmonic valve regurgitation is not visualized. 11. The inferior vena cava is dilated in size with >50% respiratory variability, suggesting right atrial pressure of 8 mmHg. 12. Evidence of atrial level shunting detected by color flow Doppler. FINDINGS  Left Ventricle: Left ventricular ejection fraction, by visual estimation, is 60 to 65%. The left ventricle has normal function. The left ventricle has no regional wall motion abnormalities. There is no left ventricular hypertrophy. Left ventricular diastolic parameters are  consistent with Grade I diastolic dysfunction (impaired relaxation). Right Ventricle: The right ventricular size is normal. No increase in right ventricular wall thickness. Global RV systolic function is has normal systolic function. Left Atrium: Left atrial size was normal in size. Right Atrium: Right atrial size was normal in size Pericardium: There is no evidence of pericardial effusion. Mitral Valve: The mitral valve is normal in structure. No evidence of mitral valve regurgitation. Tricuspid Valve: The tricuspid valve is normal in structure. Tricuspid valve regurgitation is trivial. Aortic Valve: The aortic valve is tricuspid.  Aortic valve regurgitation is not visualized. Mild aortic valve sclerosis is present, with no evidence of aortic valve stenosis. Pulmonic Valve: The pulmonic valve was normal in structure. Pulmonic valve regurgitation is not visualized. Pulmonic regurgitation is not visualized. Aorta: The aortic root is normal in size and structure. Venous: The inferior vena cava is dilated in size with greater than 50% respiratory variability, suggesting right atrial pressure of 8 mmHg. IAS/Shunts: Evidence of atrial level shunting detected by color flow Doppler. Agitated saline contrast was given intravenously to evaluate for intracardiac shunting. Saline shunting was observed within 3-6 cardiac cycles suggestive of interatrial shunt.  LEFT VENTRICLE PLAX 2D LVIDd:         4.24 cm       Diastology LVIDs:         2.55 cm       LV e' lateral:   9.90 cm/s LV PW:         1.02 cm       LV E/e' lateral: 7.3 LV IVS:        0.92 cm       LV e' medial:    8.16 cm/s LVOT diam:     1.60 cm       LV E/e' medial:  8.9 LV SV:         57 ml LV SV Index:   24.42 LVOT Area:     2.01 cm  LV Volumes (MOD) LV area d, A2C:    31.20 cm LV area d, A4C:    24.60 cm LV area s, A2C:    15.30 cm LV area s, A4C:    15.00 cm LV major d, A2C:   8.13 cm LV major d, A4C:   7.29 cm LV major s, A2C:   5.91 cm LV major s, A4C:    6.23 cm LV vol d, MOD A2C: 102.0 ml LV vol d, MOD A4C: 68.1 ml LV vol s, MOD A2C: 34.0 ml LV vol s, MOD A4C: 30.5 ml LV SV MOD A2C:     68.0 ml LV SV MOD A4C:     68.1 ml LV SV MOD BP:      54.9 ml RIGHT VENTRICLE RV S prime:     12.70 cm/s TAPSE (M-mode): 2.1 cm LEFT ATRIUM             Index       RIGHT ATRIUM           Index LA diam:        3.20 cm 1.49 cm/m  RA Area:     14.10 cm LA Vol (A2C):   42.2 ml 19.59 ml/m RA Volume:   32.70 ml  15.18 ml/m LA Vol (A4C):   32.4 ml 15.04 ml/m LA Biplane Vol: 37.1 ml 17.22 ml/m  AORTIC VALVE LVOT Vmax:   100.00 cm/s LVOT Vmean:  61.400 cm/s LVOT VTI:    0.238 m  AORTA Ao Root diam: 3.00 cm MITRAL VALVE MV Area (PHT): 3.48 cm             SHUNTS MV PHT:        63.22 msec           Systemic VTI:  0.24 m MV Decel Time: 218 msec             Systemic Diam: 1.60 cm MV E velocity: 72.50 cm/s 103 cm/s MV A velocity: 91.30 cm/s 70.3 cm/s MV E/A ratio:  0.79  1.5  Dorris Carnes MD Electronically signed by Dorris Carnes MD Signature Date/Time: 04/08/2019/3:21:29 PM    Final     Recent Labs: Lab Results  Component Value Date   WBC 10.0 03/10/2019   HGB 16.7 (H) 03/10/2019   PLT 221 03/10/2019   NA 138 03/30/2019   K 4.4 03/30/2019   CL 101 03/30/2019   CO2 28 03/30/2019   GLUCOSE 184 (H) 03/30/2019   BUN 13 03/30/2019   CREATININE 0.84 03/30/2019   BILITOT 0.4 03/30/2019   ALKPHOS 82 03/10/2019   AST 41 (H) 03/30/2019   ALT 39 (H) 03/30/2019   PROT 6.8 03/30/2019   ALBUMIN 3.7 03/10/2019   CALCIUM 9.4 03/30/2019   GFRAA 55 (L) 03/10/2019    Speciality Comments: No specialty comments available.  Procedures:  No procedures performed Allergies: Asa [aspirin], Augmentin [amoxicillin-pot clavulanate], Levaquin [levofloxacin in d5w], and Lovenox [enoxaparin sodium]   Assessment / Plan:     Visit Diagnoses: No diagnosis found.  Orders: No orders of the defined types were placed in this encounter.  No orders of the defined types were placed in this  encounter.   Face-to-face time spent with patient was *** minutes. Greater than 50% of time was spent in counseling and coordination of care.  Follow-Up Instructions: No follow-ups on file.   Ofilia Neas, PA-C  Note - This record has been created using Dragon software.  Chart creation errors have been sought, but may not always  have been located. Such creation errors do not reflect on  the standard of medical care.

## 2019-04-29 DIAGNOSIS — N390 Urinary tract infection, site not specified: Secondary | ICD-10-CM | POA: Diagnosis not present

## 2019-04-30 ENCOUNTER — Ambulatory Visit: Payer: Self-pay | Admitting: Rheumatology

## 2019-05-06 ENCOUNTER — Telehealth: Payer: Self-pay

## 2019-05-06 ENCOUNTER — Other Ambulatory Visit: Payer: Self-pay | Admitting: Gastroenterology

## 2019-05-06 NOTE — Telephone Encounter (Signed)
Pt called to say the pharmacy will be send over a form needed for prior approval of medication. Will notify pt once received.

## 2019-05-07 NOTE — Telephone Encounter (Signed)
Lmom, waiting on a return call. Received drug change request form from pts insurance.

## 2019-05-10 NOTE — Telephone Encounter (Signed)
PA has been approved for Rabeprazole 20 mg. Approval letter will be scanned in chart. Pt notified of approval.

## 2019-05-10 NOTE — Telephone Encounter (Signed)
PA has been started through [email or web address]. waiting on an approval or denial.

## 2019-05-18 ENCOUNTER — Other Ambulatory Visit: Payer: Self-pay | Admitting: Neurology

## 2019-05-18 ENCOUNTER — Other Ambulatory Visit (HOSPITAL_COMMUNITY): Payer: Self-pay | Admitting: Neurology

## 2019-05-18 ENCOUNTER — Telehealth: Payer: Self-pay

## 2019-05-18 DIAGNOSIS — I679 Cerebrovascular disease, unspecified: Secondary | ICD-10-CM

## 2019-05-18 NOTE — Telephone Encounter (Signed)
Noted  

## 2019-05-18 NOTE — Telephone Encounter (Signed)
Maxbass, [place]. PA was approved and documented in chart 05/10/2019.

## 2019-05-18 NOTE — Telephone Encounter (Signed)
Rabeprazole is not covered by pt's insurance.   Alternative Omeprazole.  Cyril Mourning, please advise!

## 2019-05-19 DIAGNOSIS — I10 Essential (primary) hypertension: Secondary | ICD-10-CM | POA: Diagnosis not present

## 2019-05-19 DIAGNOSIS — E119 Type 2 diabetes mellitus without complications: Secondary | ICD-10-CM | POA: Diagnosis not present

## 2019-05-25 ENCOUNTER — Ambulatory Visit (HOSPITAL_COMMUNITY)
Admission: RE | Admit: 2019-05-25 | Discharge: 2019-05-25 | Disposition: A | Discharge status: Home / Self Care | Payer: Medicare Other | Source: Ambulatory Visit | Attending: Neurology | Admitting: Neurology

## 2019-05-25 ENCOUNTER — Other Ambulatory Visit: Payer: Self-pay

## 2019-05-25 DIAGNOSIS — I679 Cerebrovascular disease, unspecified: Secondary | ICD-10-CM | POA: Insufficient documentation

## 2019-05-25 DIAGNOSIS — I6523 Occlusion and stenosis of bilateral carotid arteries: Secondary | ICD-10-CM | POA: Diagnosis not present

## 2019-06-10 ENCOUNTER — Ambulatory Visit: Payer: Medicare Other | Admitting: Gastroenterology

## 2019-06-11 ENCOUNTER — Ambulatory Visit: Payer: Medicare Other | Admitting: Gastroenterology

## 2019-06-16 DIAGNOSIS — E119 Type 2 diabetes mellitus without complications: Secondary | ICD-10-CM | POA: Diagnosis not present

## 2019-06-16 DIAGNOSIS — I10 Essential (primary) hypertension: Secondary | ICD-10-CM | POA: Diagnosis not present

## 2019-06-17 NOTE — Progress Notes (Deleted)
Office Visit Note  Patient: Wendy Kramer             Date of Birth: 06-May-1960           MRN: [id number]             PCP: Rosita Fire, MD Referring: Rosita Fire, MD Visit Date: 06/24/2019 Occupation: @GUAROCC @  Subjective:  No chief complaint on file.   History of Present Illness: Wendy Kramer is a 59 y.o. female ***   Activities of Daily Living:  Patient reports morning stiffness for *** {minute/hour:19697}.   Patient {ACTIONS;DENIES/REPORTS:21021675::"Denies"} nocturnal pain.  Difficulty dressing/grooming: {ACTIONS;DENIES/REPORTS:21021675::"Denies"} Difficulty climbing stairs: {ACTIONS;DENIES/REPORTS:21021675::"Denies"} Difficulty getting out of chair: {ACTIONS;DENIES/REPORTS:21021675::"Denies"} Difficulty using hands for taps, buttons, cutlery, and/or writing: {ACTIONS;DENIES/REPORTS:21021675::"Denies"}  No Rheumatology ROS completed.   PMFS History:  Patient Active Problem List   Diagnosis Date Noted  . Abnormal LFTs 03/16/2019  . Diabetes mellitus without complication ([place])   . Positive colorectal cancer screening using Cologuard test 12/24/2017  . Abdominal pain, epigastric 12/24/2017  . GERD (gastroesophageal reflux disease) 12/24/2017  . Fatty liver 12/24/2017    Past Medical History:  Diagnosis Date  . Arthritis   . Cardiac murmur   . Cervical spine disease   . Diabetes mellitus without complication (HCC)    diet controlled  . Exogenous hyperlipidemia   . History of kidney stones   . HTN (hypertension)   . Morbid obesity due to excess calories ([place])   . Other chronic pain   . Panic disorder   . Patent foramen ovale    TEE 2018  . Prediabetes   . Sleep apnea   . Stroke [facility])    "mini-stroke" no deficits from this.  Marland Kitchen Unspecified asthma with (acute) exacerbation   . Vitamin D deficiency     Family History  Problem Relation Age of Onset  . Breast cancer Maternal Aunt   . Colon cancer Other        maternal great grandfather  . Liver  disease Brother        Fatty liver  . Hypertension Mother   . Hypertension Father    Past Surgical History:  Procedure Laterality Date  . BIOPSY  03/05/2018   Procedure: BIOPSY;  Surgeon: Daneil Dolin, MD;  [facility];  Service: Endoscopy;;  gastric  . CHOLECYSTECTOMY    . COLONOSCOPY WITH PROPOFOL N/A 03/05/2018   Dr. Gala Romney: Diverticulosis in the sigmoid and descending colon.  3 colon polyps removed, 2 hyperplastic and one sessile serrated polyp.  Next colonoscopy 3 years.  . ESOPHAGOGASTRODUODENOSCOPY (EGD) WITH PROPOFOL N/A 03/05/2018   Dr. Gala Romney: Erosive reflux esophagitis, small hiatal hernia, erosive gastropathy with reactive gastropathy on biopsy.  No H. pylori.  Marland Kitchen HERNIA REPAIR     umbilical  . POLYPECTOMY  03/05/2018   Procedure: POLYPECTOMY;  Surgeon: Daneil Dolin, MD;  [facility];  Service: Endoscopy;;  colon  . SHOULDER ARTHROSCOPY Right   . TEE WITHOUT CARDIOVERSION  2018   patent foramen ovale  . TONSILLECTOMY AND ADENOIDECTOMY    . TUBAL LIGATION     Social History   Social History Narrative  . Not on file    There is no immunization history on file for this patient.   Objective: Vital Signs: LMP 09/06/2016    Physical Exam   Musculoskeletal Exam: ***  CDAI Exam: CDAI Score: -- Patient Global: --; Provider Global: -- Swollen: --; Tender: -- Joint Exam 06/24/2019   No joint exam  has been documented for this visit   There is currently no information documented on the homunculus. Go to the Rheumatology activity and complete the homunculus joint exam.  Investigation: No additional findings.  Imaging: US Carotid Bilateral  Result Date: 05/25/2019 CLINICAL DATA:  59 year old female with a history of dizziness EXAM: BILATERAL CAROTID DUPLEX ULTRASOUND TECHNIQUE: Pearline Cables scale imaging, color Doppler and duplex ultrasound were performed of bilateral carotid and vertebral arteries in the neck. COMPARISON:  No prior duplex FINDINGS:  Criteria: Quantification of carotid stenosis is based on velocity parameters that correlate the residual internal carotid diameter with NASCET-based stenosis levels, using the diameter of the distal internal carotid lumen as the denominator for stenosis measurement. The following velocity measurements were obtained: RIGHT ICA:  Systolic 123XX123 cm/sec, Diastolic 47 cm/sec CCA:  [id number] cm/sec SYSTOLIC ICA/CCA RATIO:  0.7 ECA:  184 cm/sec LEFT ICA:  Systolic 97 cm/sec, Diastolic 31 cm/sec CCA:  123456 cm/sec SYSTOLIC ICA/CCA RATIO:  0.8 ECA:  91 cm/sec Right Brachial SBP: Not acquired Left Brachial SBP: Not acquired RIGHT CAROTID ARTERY: No significant calcifications of the right common carotid artery. Intermediate waveform maintained. Heterogeneous and partially calcified plaque at the right carotid bifurcation. No significant lumen shadowing. Low resistance waveform of the right ICA. No significant tortuosity. RIGHT VERTEBRAL ARTERY: Antegrade flow with low resistance waveform. LEFT CAROTID ARTERY: No significant calcifications of the left common carotid artery. Intermediate waveform maintained. Heterogeneous and partially calcified plaque at the left carotid bifurcation without significant lumen shadowing. Low resistance waveform of the left ICA. No significant tortuosity. LEFT VERTEBRAL ARTERY:  Antegrade flow with low resistance waveform. IMPRESSION: Color duplex indicates minimal heterogeneous and calcified plaque, with no hemodynamically significant stenosis by duplex criteria in the extracranial cerebrovascular circulation. Signed, Dulcy Fanny. Dellia Nims, RPVI [facility] Electronically Signed   By: Corrie Mckusick D.O.   On: 05/25/2019 17:05    Recent Labs: Lab Results  Component Value Date   WBC 10.0 03/10/2019   HGB 16.7 (H) 03/10/2019   PLT 221 03/10/2019   NA 138 03/30/2019   K 4.4 03/30/2019   CL 101 03/30/2019   CO2 28 03/30/2019   GLUCOSE 184 (H)  03/30/2019   BUN 13 03/30/2019   CREATININE 0.84 03/30/2019   BILITOT 0.4 03/30/2019   ALKPHOS 82 03/10/2019   AST 41 (H) 03/30/2019   ALT 39 (H) 03/30/2019   PROT 6.8 03/30/2019   ALBUMIN 3.7 03/10/2019   CALCIUM 9.4 03/30/2019   GFRAA 55 (L) 03/10/2019    Speciality Comments: No specialty comments available.  Procedures:  No procedures performed Allergies: Asa [aspirin], Augmentin [amoxicillin-pot clavulanate], Levaquin [levofloxacin in d5w], and Lovenox [enoxaparin sodium]   Assessment / Plan:     Visit Diagnoses: No diagnosis found.  Orders: No orders of the defined types were placed in this encounter.  No orders of the defined types were placed in this encounter.   Face-to-face time spent with patient was *** minutes. Greater than 50% of time was spent in counseling and coordination of care.  Follow-Up Instructions: No follow-ups on file.   Ofilia Neas, PA-C  Note - This record has been created using Dragon software.  Chart creation errors have been sought, but may not always  have been located. Such creation errors do not reflect on  the standard of medical care.

## 2019-06-24 ENCOUNTER — Ambulatory Visit: Payer: Self-pay | Admitting: Rheumatology

## 2019-07-06 ENCOUNTER — Ambulatory Visit: Payer: Medicare Other | Admitting: Gastroenterology

## 2019-07-17 DIAGNOSIS — I10 Essential (primary) hypertension: Secondary | ICD-10-CM | POA: Diagnosis not present

## 2019-07-17 DIAGNOSIS — J4540 Moderate persistent asthma, uncomplicated: Secondary | ICD-10-CM | POA: Diagnosis not present

## 2019-07-22 DIAGNOSIS — Z79899 Other long term (current) drug therapy: Secondary | ICD-10-CM | POA: Diagnosis not present

## 2019-07-22 DIAGNOSIS — E039 Hypothyroidism, unspecified: Secondary | ICD-10-CM | POA: Diagnosis not present

## 2019-07-22 DIAGNOSIS — E559 Vitamin D deficiency, unspecified: Secondary | ICD-10-CM | POA: Diagnosis not present

## 2019-07-22 DIAGNOSIS — I10 Essential (primary) hypertension: Secondary | ICD-10-CM | POA: Diagnosis not present

## 2019-07-22 DIAGNOSIS — E119 Type 2 diabetes mellitus without complications: Secondary | ICD-10-CM | POA: Diagnosis not present

## 2019-07-22 LAB — COMPREHENSIVE METABOLIC PANEL
GFR calc Af Amer: 96
GFR calc non Af Amer: 83

## 2019-07-23 LAB — VITAMIN B12: Vitamin B-12: 356

## 2019-07-23 LAB — BASIC METABOLIC PANEL
BUN: 12 (ref 4–21)
Creatinine: 0.1 — AB (ref 0.5–1.1)

## 2019-07-23 LAB — TSH: TSH: 1.85 (ref 0.41–5.90)

## 2019-07-23 LAB — HEMOGLOBIN A1C: Hemoglobin A1C: 9.2

## 2019-08-03 DIAGNOSIS — I10 Essential (primary) hypertension: Secondary | ICD-10-CM | POA: Diagnosis not present

## 2019-08-03 DIAGNOSIS — I6990 Unspecified sequelae of unspecified cerebrovascular disease: Secondary | ICD-10-CM | POA: Diagnosis not present

## 2019-08-03 DIAGNOSIS — I679 Cerebrovascular disease, unspecified: Secondary | ICD-10-CM | POA: Diagnosis not present

## 2019-08-03 DIAGNOSIS — M542 Cervicalgia: Secondary | ICD-10-CM | POA: Diagnosis not present

## 2019-08-03 DIAGNOSIS — F419 Anxiety disorder, unspecified: Secondary | ICD-10-CM | POA: Diagnosis not present

## 2019-08-03 DIAGNOSIS — M79601 Pain in right arm: Secondary | ICD-10-CM | POA: Diagnosis not present

## 2019-08-03 DIAGNOSIS — E612 Magnesium deficiency: Secondary | ICD-10-CM | POA: Diagnosis not present

## 2019-08-03 DIAGNOSIS — M545 Low back pain: Secondary | ICD-10-CM | POA: Diagnosis not present

## 2019-08-03 DIAGNOSIS — R252 Cramp and spasm: Secondary | ICD-10-CM | POA: Diagnosis not present

## 2019-08-05 DIAGNOSIS — E612 Magnesium deficiency: Secondary | ICD-10-CM | POA: Diagnosis not present

## 2019-08-16 DIAGNOSIS — E119 Type 2 diabetes mellitus without complications: Secondary | ICD-10-CM | POA: Diagnosis not present

## 2019-08-16 DIAGNOSIS — I10 Essential (primary) hypertension: Secondary | ICD-10-CM | POA: Diagnosis not present

## 2019-09-08 NOTE — Progress Notes (Deleted)
Office Visit Note  Patient: Wendy Kramer             Date of Birth: 09/25/1960           MRN: [id number]             PCP: Rosita Fire, MD Referring: Rosita Fire, MD Visit Date: 09/21/2019 Occupation: @GUAROCC @  Subjective:  No chief complaint on file.   History of Present Illness: Wendy Kramer is a 59 y.o. female ***   Activities of Daily Living:  Patient reports morning stiffness for *** {minute/hour:19697}.   Patient {ACTIONS;DENIES/REPORTS:21021675::"Denies"} nocturnal pain.  Difficulty dressing/grooming: {ACTIONS;DENIES/REPORTS:21021675::"Denies"} Difficulty climbing stairs: {ACTIONS;DENIES/REPORTS:21021675::"Denies"} Difficulty getting out of chair: {ACTIONS;DENIES/REPORTS:21021675::"Denies"} Difficulty using hands for taps, buttons, cutlery, and/or writing: {ACTIONS;DENIES/REPORTS:21021675::"Denies"}  No Rheumatology ROS completed.   PMFS History:  Patient Active Problem List   Diagnosis Date Noted   Abnormal LFTs 03/16/2019   Diabetes mellitus without complication (HCC)    Positive colorectal cancer screening using Cologuard test 12/24/2017   Abdominal pain, epigastric 12/24/2017   GERD (gastroesophageal reflux disease) 12/24/2017   Fatty liver 12/24/2017    Past Medical History:  Diagnosis Date   Arthritis    Cardiac murmur    Cervical spine disease    Diabetes mellitus without complication (HCC)    diet controlled   Exogenous hyperlipidemia    History of kidney stones    HTN (hypertension)    Morbid obesity due to excess calories (HCC)    Other chronic pain    Panic disorder    Patent foramen ovale    TEE 2018   Prediabetes    Sleep apnea    Stroke ([place])    "mini-stroke" no deficits from this.   Unspecified asthma with (acute) exacerbation    Vitamin D deficiency     Family History  Problem Relation Age of Onset   Breast cancer Maternal Aunt    Colon cancer Other        maternal great grandfather   Liver  disease Brother        Fatty liver   Hypertension Mother    Hypertension Father    Past Surgical History:  Procedure Laterality Date   BIOPSY  03/05/2018   Procedure: BIOPSY;  Surgeon: Daneil Dolin, MD;  [facility];  Service: Endoscopy;;  gastric   CHOLECYSTECTOMY     COLONOSCOPY WITH PROPOFOL N/A 03/05/2018   Dr. Gala Romney: Diverticulosis in the sigmoid and descending colon.  3 colon polyps removed, 2 hyperplastic and one sessile serrated polyp.  Next colonoscopy 3 years.   ESOPHAGOGASTRODUODENOSCOPY (EGD) WITH PROPOFOL N/A 03/05/2018   Dr. Gala Romney: Erosive reflux esophagitis, small hiatal hernia, erosive gastropathy with reactive gastropathy on biopsy.  No H. pylori.   HERNIA REPAIR     umbilical   POLYPECTOMY  03/05/2018   Procedure: POLYPECTOMY;  Surgeon: Daneil Dolin, MD;  [facility];  Service: Endoscopy;;  colon   SHOULDER ARTHROSCOPY Right    TEE WITHOUT CARDIOVERSION  2018   patent foramen ovale   TONSILLECTOMY AND ADENOIDECTOMY     TUBAL LIGATION     Social History   Social History Narrative   Not on file    There is no immunization history on file for this patient.   Objective: Vital Signs: LMP 09/06/2016    Physical Exam   Musculoskeletal Exam: ***  CDAI Exam: CDAI Score: -- Patient Global: --; Provider Global: -- Swollen: --; Tender: -- Joint Exam 09/21/2019   No joint exam  has been documented for this visit   There is currently no information documented on the homunculus. Go to the Rheumatology activity and complete the homunculus joint exam.  Investigation: No additional findings.  Imaging: No results found.  Recent Labs: Lab Results  Component Value Date   WBC 10.0 03/10/2019   HGB 16.7 (H) 03/10/2019   PLT 221 03/10/2019   NA 138 03/30/2019   K 4.4 03/30/2019   CL 101 03/30/2019   CO2 28 03/30/2019   GLUCOSE 184 (H) 03/30/2019   BUN 13 03/30/2019   CREATININE 0.84 03/30/2019   BILITOT 0.4 03/30/2019    ALKPHOS 82 03/10/2019   AST 41 (H) 03/30/2019   ALT 39 (H) 03/30/2019   PROT 6.8 03/30/2019   ALBUMIN 3.7 03/10/2019   CALCIUM 9.4 03/30/2019   GFRAA 55 (L) 03/10/2019    Speciality Comments: No specialty comments available.  Procedures:  No procedures performed Allergies: Asa [aspirin], Augmentin [amoxicillin-pot clavulanate], Levaquin [levofloxacin in d5w], and Lovenox [enoxaparin sodium]   Assessment / Plan:     Visit Diagnoses: No diagnosis found.  Orders: No orders of the defined types were placed in this encounter.  No orders of the defined types were placed in this encounter.   Face-to-face time spent with patient was *** minutes. Greater than 50% of time was spent in counseling and coordination of care.  Follow-Up Instructions: No follow-ups on file.   Ofilia Neas, PA-C  Note - This record has been created using Dragon software.  Chart creation errors have been sought, but may not always  have been located. Such creation errors do not reflect on  the standard of medical care.

## 2019-09-13 ENCOUNTER — Other Ambulatory Visit (HOSPITAL_COMMUNITY): Payer: Self-pay | Admitting: Internal Medicine

## 2019-09-13 DIAGNOSIS — I10 Essential (primary) hypertension: Secondary | ICD-10-CM | POA: Diagnosis not present

## 2019-09-13 DIAGNOSIS — Z1331 Encounter for screening for depression: Secondary | ICD-10-CM | POA: Diagnosis not present

## 2019-09-13 DIAGNOSIS — F17210 Nicotine dependence, cigarettes, uncomplicated: Secondary | ICD-10-CM | POA: Diagnosis not present

## 2019-09-13 DIAGNOSIS — J4540 Moderate persistent asthma, uncomplicated: Secondary | ICD-10-CM | POA: Diagnosis not present

## 2019-09-13 DIAGNOSIS — E1165 Type 2 diabetes mellitus with hyperglycemia: Secondary | ICD-10-CM | POA: Diagnosis not present

## 2019-09-13 DIAGNOSIS — Z1389 Encounter for screening for other disorder: Secondary | ICD-10-CM | POA: Diagnosis not present

## 2019-09-13 DIAGNOSIS — Z1231 Encounter for screening mammogram for malignant neoplasm of breast: Secondary | ICD-10-CM

## 2019-09-21 ENCOUNTER — Ambulatory Visit: Payer: Self-pay | Admitting: Rheumatology

## 2019-09-23 ENCOUNTER — Ambulatory Visit: Payer: Medicare Other | Admitting: "Endocrinology

## 2019-09-24 NOTE — Progress Notes (Signed)
Office Visit Note  Patient: Wendy Kramer             Date of Birth: Nov 07, 1960           MRN: [id number]             PCP: Rosita Fire, MD Referring: Rosita Fire, MD Visit Date: 10/01/2019 Occupation: @GUAROCC @  Subjective:  Positive ANA.   History of Present Illness: Wendy Kramer is a 59 y.o. female seen in consultation per request of her CP.  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  She had extensive work-up and was told that it was due to autoimmune disease.  She never did see a rheumatologist and the symptoms resolved.  She had a flare 1 year later and had no recurrence since then.  She states about 15 years ago she was told that she had disc disease in her lower back as she injured her back at work.  In 2011 she had another accident at work and she had right rotator cuff tear.  She was also having neck discomfort and was told that she had disc prolapse.  She continued to have some discomfort in her entire spine over the years.  The lower back pain has increased over the last 1 year.  She states she also has discomfort in her shoulders, hip joints and her knee joints.  She has not seen any joint swelling.  She goes to pain management physician for her joint pain.  She recalls that on March 12, 2019 she had a syncopal episode while she was at [employer].  She was taken to the hospital where she was found to have elevated LFTs and splenomegaly.  She was also diagnosed with dehydration due to diuretics.  She was referred to gastroenterologist who did lab work and her ANA was positive.  She was referred to me for evaluation of positive ANA.  There is no family history of autoimmune disease.  She is gravida 5, para 5 miscarriages 0  Activities of Daily Living:  Patient reports morning stiffness for 5-10 minutes.   Patient Reports nocturnal pain.  Difficulty dressing/grooming: Reports Difficulty climbing  stairs: Reports Difficulty getting out of chair: Reports Difficulty using hands for taps, buttons, cutlery, and/or writing: Reports  Review of Systems  Constitutional: Positive for fatigue. Negative for night sweats, weight gain and weight loss.  HENT: Negative for mouth sores, trouble swallowing, trouble swallowing, mouth dryness and nose dryness.   Eyes: Negative for pain, redness, itching, visual disturbance and dryness.  Respiratory: Negative for cough, shortness of breath and difficulty breathing.   Cardiovascular: Negative for chest pain, palpitations, hypertension, irregular heartbeat and swelling in legs/feet.  Gastrointestinal: Positive for diarrhea. Negative for blood in stool and constipation.  Endocrine: Negative for increased urination.  Genitourinary: Negative for difficulty urinating, painful urination and vaginal dryness.  Musculoskeletal: Positive for arthralgias, joint pain, myalgias, morning stiffness and myalgias. Negative for joint swelling, muscle weakness and muscle tenderness.  Skin: Negative for color change, rash, hair loss, redness, skin tightness, ulcers and sensitivity to sunlight.  Allergic/Immunologic: Negative for susceptible to infections.  Neurological: Positive for numbness. Negative for dizziness, headaches, memory loss, night sweats and weakness.  Hematological: Negative for bruising/bleeding tendency and swollen glands.  Psychiatric/Behavioral: Negative for depressed mood, confusion and sleep disturbance. The patient is not nervous/anxious.     PMFS History:  Patient Active Problem List   Diagnosis Date  Noted  . Uncontrolled type 2 diabetes mellitus with hyperglycemia ([place]) 09/27/2019  . Mixed hyperlipidemia 09/27/2019  . Morbid obesity ([place]) 09/27/2019  . Abnormal LFTs 03/16/2019  . Diabetes mellitus without complication ([place])   . Positive colorectal cancer screening using Cologuard test 12/24/2017  . Abdominal pain, epigastric 12/24/2017  . GERD  (gastroesophageal reflux disease) 12/24/2017  . Fatty liver 12/24/2017    Past Medical History:  Diagnosis Date  . Arthritis   . Cardiac murmur   . Cervical spine disease   . Diabetes mellitus without complication (HCC)    diet controlled  . Exogenous hyperlipidemia   . History of kidney stones   . HTN (hypertension)   . Morbid obesity due to excess calories ([place])   . Other chronic pain   . Panic disorder   . Patent foramen ovale    TEE 2018  . Prediabetes   . Sleep apnea   . Stroke [facility])    "mini-stroke" no deficits from this.  Marland Kitchen Unspecified asthma with (acute) exacerbation   . Vitamin D deficiency     Family History  Problem Relation Age of Onset  . Breast cancer Maternal Aunt   . Colon cancer Other        maternal great grandfather  . Liver disease Brother        Fatty liver  . HIV Brother   . Hypertension Mother   . Hyperlipidemia Mother   . Hypertension Father   . Thyroid disease Sister   . Pancreatic cancer Sister   . Healthy Daughter   . Healthy Son   . Healthy Son   . Heart disease Son    Past Surgical History:  Procedure Laterality Date  . BIOPSY  03/05/2018   Procedure: BIOPSY;  Surgeon: Daneil Dolin, MD;  Location: [facility];  Service: Endoscopy;;  gastric  . CHOLECYSTECTOMY    . COLONOSCOPY WITH PROPOFOL N/A 03/05/2018   Dr. Gala Romney: Diverticulosis in the sigmoid and descending colon.  3 colon polyps removed, 2 hyperplastic and one sessile serrated polyp.  Next colonoscopy 3 years.  . ESOPHAGOGASTRODUODENOSCOPY (EGD) WITH PROPOFOL N/A 03/05/2018   Dr. Gala Romney: Erosive reflux esophagitis, small hiatal hernia, erosive gastropathy with reactive gastropathy on biopsy.  No H. pylori.  Marland Kitchen HERNIA REPAIR     umbilical  . POLYPECTOMY  03/05/2018   Procedure: POLYPECTOMY;  Surgeon: Daneil Dolin, MD;  Location: [facility];  Service: Endoscopy;;  colon  . SHOULDER ARTHROSCOPY Right   . TEE WITHOUT CARDIOVERSION  2018   patent foramen ovale  .  TONSILLECTOMY AND ADENOIDECTOMY    . TUBAL LIGATION     Social History   Social History Narrative  . Not on file    There is no immunization history on file for this patient.   Objective: Vital Signs: BP (!) 142/88 (BP Location: Right Wrist, Patient Position: Sitting, Cuff Size: Normal)   Pulse 86   Resp 17   Ht 5' 3.5" (1.613 m)   Wt 239 lb (108.4 kg)   LMP 09/06/2016   BMI 41.67 kg/m    Physical Exam Vitals and nursing note reviewed.  Constitutional:      Appearance: She is well-developed.  HENT:     Head: Normocephalic and atraumatic.  Eyes:     Conjunctiva/sclera: Conjunctivae normal.  Cardiovascular:     Rate and Rhythm: Normal rate and regular rhythm.     Heart sounds: Normal heart sounds.  Pulmonary:     Effort: Pulmonary  effort is normal.     Breath sounds: Normal breath sounds.  Abdominal:     General: Bowel sounds are normal.     Palpations: Abdomen is soft.  Musculoskeletal:     Cervical back: Normal range of motion.  Lymphadenopathy:     Cervical: No cervical adenopathy.  Skin:    General: Skin is warm and dry.     Capillary Refill: Capillary refill takes less than 2 seconds.  Neurological:     Mental Status: She is alert and oriented to person, place, and time.  Psychiatric:        Behavior: Behavior normal.      Musculoskeletal Exam: She has some limitation of range of motion of her cervical spine. Thoracic and lumbar spine were also limited range of motion.  She had discomfort in raising her right arm.  Elbow joints, wrist joints, MCPs PIPs and DIPs with good range of motion with no synovitis.  She had no evidence of physical activity or telangiectasias.  No nailbed capillary changes were noted.  Hip joints and knee joints in good range of motion with no synovitis.  Ankle joints, MTPs and PIPs had no tenderness. CDAI Exam: CDAI Score: -- Patient Global: --; Provider Global: -- Swollen: --; Tender: -- Joint Exam 10/01/2019   No joint exam has  been documented for this visit   There is currently no information documented on the homunculus. Go to the Rheumatology activity and complete the homunculus joint exam.  Investigation: No additional findings.  Imaging: MM 3D SCREEN BREAST BILATERAL  Result Date: 09/30/2019 CLINICAL DATA:  Screening. EXAM: DIGITAL SCREENING BILATERAL MAMMOGRAM WITH TOMO AND CAD COMPARISON:  Previous exam(s). ACR Breast Density Category a: The breast tissue is almost entirely fatty. FINDINGS: There are no findings suspicious for malignancy. Images were processed with CAD. IMPRESSION: No mammographic evidence of malignancy. A result letter of this screening mammogram will be mailed directly to the patient. RECOMMENDATION: Screening mammogram in one year. (Code:SM-B-01Y) BI-RADS CATEGORY  1: Negative. Electronically Signed   By: Lajean Manes M.D.   On: 09/30/2019 16:13    Recent Labs: Lab Results  Component Value Date   WBC 10.0 03/10/2019   HGB 16.7 (H) 03/10/2019   PLT 221 03/10/2019   NA 138 03/30/2019   K 4.4 03/30/2019   CL 101 03/30/2019   CO2 28 03/30/2019   GLUCOSE 184 (H) 03/30/2019   BUN 12 07/23/2019   CREATININE 0.1 (A) 07/23/2019   BILITOT 0.4 03/30/2019   ALKPHOS 82 03/10/2019   AST 41 (H) 03/30/2019   ALT 39 (H) 03/30/2019   PROT 6.8 03/30/2019   ALBUMIN 3.7 03/10/2019   CALCIUM 9.4 03/30/2019   GFRAA 96 07/23/2019    Speciality Comments: No specialty comments available.  Procedures:  No procedures performed Allergies: Asa [aspirin], Augmentin [amoxicillin-pot clavulanate], Levaquin [levofloxacin in d5w], and Lovenox [enoxaparin sodium]   Assessment / Plan:     Visit Diagnoses: Positive ANA (antinuclear antibody) - 03/30/19: ANA 1:80NH, 1:80NS, M2 Ab-, iron panel WNL, immunoglobulins WNL, Hep B and C negative  -patient was found to have low titer positive ANA while she had work-up for elevated LFTs.  She denies any history of oral ulcers, nasal ulcers, malar rash,  photosensitivity, Raynaud's phenomenon or lymphadenopathy.  She has some arthralgias but no joint swelling.  Plan: ANA, Anti-scleroderma antibody, RNP Antibody, Anti-Smith antibody, Sjogrens syndrome-A extractable nuclear antibody, Sjogrens syndrome-B extractable nuclear antibody, Anti-DNA antibody, double-stranded, C3 and C4, Beta-2 glycoprotein antibodies, Cardiolipin antibodies, IgG,  IgM, IgA, Lupus Anticoagulant Eval w/Reflex  Abnormal LFTs-she has chronic elevation of LFTs.  Chronic pain of both shoulders-patient gives history of injury to her right shoulder and was diagnosed with rotator cuff tear in 2011.  Since then she had right shoulder joint discomfort.  Chronic pain of both hips-she complains of discomfort in her bilateral hips for which she has been seen by pain management.  I do not have x-rays available.  Chronic pain of both knees-she states she has been diagnosed with arthritis at the pain management and is on pain meds.  DDD (degenerative disc disease), cervical-I reviewed x-ray of her cervical spine which showed degenerative changes and moderate to severe spinal stenosis.  She continues to have chronic discomfort in her neck.  Chronic midline low back pain with bilateral sciatica-according to patient she also has degenerative changes in her lumbar spine for which she has been seen at pain management.  I do not have those records or MRI results.  Essential hypertension-her blood pressure is elevated today.  She has been advised to monitor blood pressure closely.  Patent foramen ovale  History of gastroesophageal reflux (GERD)  Diabetes mellitus without complication (HCC)  Fatty liver  History of asthma  History of sleep apnea  Vitamin D deficiency  Positive colorectal cancer screening using Cologuard test  Orders: Orders Placed This Encounter  Procedures  . ANA  . Anti-scleroderma antibody  . RNP Antibody  . Anti-Smith antibody  . Sjogrens syndrome-A  extractable nuclear antibody  . Sjogrens syndrome-B extractable nuclear antibody  . Anti-DNA antibody, double-stranded  . C3 and C4  . Beta-2 glycoprotein antibodies  . Cardiolipin antibodies, IgG, IgM, IgA  . Lupus Anticoagulant Eval w/Reflex   No orders of the defined types were placed in this encounter.     Follow-Up Instructions: Return for Positive ANA, joint pain.   Bo Merino, MD  Note - This record has been created using Editor, commissioning.  Chart creation errors have been sought, but may not always  have been located. Such creation errors do not reflect on  the standard of medical care.

## 2019-09-27 ENCOUNTER — Ambulatory Visit (INDEPENDENT_AMBULATORY_CARE_PROVIDER_SITE_OTHER): Payer: Medicare Other | Admitting: "Endocrinology

## 2019-09-27 ENCOUNTER — Other Ambulatory Visit: Payer: Self-pay

## 2019-09-27 ENCOUNTER — Encounter: Payer: Self-pay | Admitting: "Endocrinology

## 2019-09-27 VITALS — BP 135/80 | HR 97 | Ht 64.0 in | Wt 244.2 lb

## 2019-09-27 DIAGNOSIS — E782 Mixed hyperlipidemia: Secondary | ICD-10-CM | POA: Diagnosis not present

## 2019-09-27 DIAGNOSIS — E1165 Type 2 diabetes mellitus with hyperglycemia: Secondary | ICD-10-CM

## 2019-09-27 NOTE — Patient Instructions (Signed)

## 2019-09-27 NOTE — Progress Notes (Signed)
Endocrinology Consult Note       09/27/2019, 5:29 PM   Subjective:    Patient ID: Wendy Kramer, female    DOB: June 15, 1960.  Wendy Kramer is being seen in consultation for management of currently uncontrolled symptomatic diabetes requested by  Rosita Fire, MD.   Past Medical History:  Diagnosis Date   Arthritis    Cardiac murmur    Cervical spine disease    Diabetes mellitus without complication ([place])    diet controlled   Exogenous hyperlipidemia    History of kidney stones    HTN (hypertension)    Morbid obesity due to excess calories ([place])    Other chronic pain    Panic disorder    Patent foramen ovale    TEE 2018   Prediabetes    Sleep apnea    Stroke ([place])    "mini-stroke" no deficits from this.   Unspecified asthma with (acute) exacerbation    Vitamin D deficiency     Past Surgical History:  Procedure Laterality Date   BIOPSY  03/05/2018   Procedure: BIOPSY;  Surgeon: Daneil Dolin, MD;  Location: [facility];  Service: Endoscopy;;  gastric   CHOLECYSTECTOMY     COLONOSCOPY WITH PROPOFOL N/A 03/05/2018   Dr. Gala Romney: Diverticulosis in the sigmoid and descending colon.  3 colon polyps removed, 2 hyperplastic and one sessile serrated polyp.  Next colonoscopy 3 years.   ESOPHAGOGASTRODUODENOSCOPY (EGD) WITH PROPOFOL N/A 03/05/2018   Dr. Gala Romney: Erosive reflux esophagitis, small hiatal hernia, erosive gastropathy with reactive gastropathy on biopsy.  No H. pylori.   HERNIA REPAIR     umbilical   POLYPECTOMY  03/05/2018   Procedure: POLYPECTOMY;  Surgeon: Daneil Dolin, MD;  Location: [facility];  Service: Endoscopy;;  colon   SHOULDER ARTHROSCOPY Right    TEE WITHOUT CARDIOVERSION  2018   patent foramen ovale   TONSILLECTOMY AND ADENOIDECTOMY     TUBAL LIGATION      Social History   Socioeconomic History   Marital status: Legally Separated     Spouse name: Not on file   Number of children: Not on file   Years of education: Not on file   Highest education level: Not on file  Occupational History   Not on file  Tobacco Use   Smoking status: Current Every Day Smoker    Packs/day: 0.50    Years: 40.00    Pack years: 20.00    Types: Cigarettes   Smokeless tobacco: Never Used  Vaping Use   Vaping Use: Never used  Substance and Sexual Activity   Alcohol use: No   Drug use: No   Sexual activity: Not Currently    Birth control/protection: Post-menopausal  Other Topics Concern   Not on file  Social History Narrative   Not on file   Social Determinants of Health   Financial Resource Strain:    Difficulty of Paying Living Expenses:   Food Insecurity:    Worried About Estate manager/land agent of Food in the Last Year:    Ran Out of Food in the Last Year:   Transportation Needs:  Lack of Transportation (Medical):    Lack of Transportation (Non-Medical):   Physical Activity:    Days of Exercise per Week:    Minutes of Exercise per Session:   Stress:    Feeling of Stress :   Social Connections:    Frequency of Communication with Friends and Family:    Frequency of Social Gatherings with Friends and Family:    Attends Religious Services:    Active Member of Clubs or Organizations:    Attends Music therapist:    Marital Status:     Family History  Problem Relation Age of Onset   Breast cancer Maternal Aunt    Colon cancer Other        maternal great grandfather   Liver disease Brother        Fatty liver   Hypertension Mother    Hyperlipidemia Mother    Hypertension Father     Outpatient Encounter Medications as of 09/27/2019  Medication Sig   Multiple Vitamin (MULTIVITAMIN ADULT PO) Take 1 tablet by mouth daily.   acetaminophen (TYLENOL) 500 MG tablet Take 500 mg by mouth every 6 (six) hours as needed.   albuterol (PROVENTIL HFA;VENTOLIN HFA) 108 (90 Base) MCG/ACT inhaler  Inhale 1-2 puffs into the lungs every 6 (six) hours as needed for wheezing or shortness of breath.   ALPRAZolam (XANAX) 0.5 MG tablet Take 0.25 mg by mouth daily as needed for anxiety.    budesonide-formoterol (SYMBICORT) 80-4.5 MCG/ACT inhaler Inhale 1 puff into the lungs 2 (two) times daily.    Cholecalciferol (VITAMIN D3) 50 MCG (2000 UT) TABS Take 2,000 Units by mouth daily.    clopidogrel (PLAVIX) 75 MG tablet Take 1 tablet (75 mg total) by mouth daily.   cyclobenzaprine (FLEXERIL) 5 MG tablet Take 5 mg by mouth daily as needed for muscle spasms.    fluticasone (FLONASE) 50 MCG/ACT nasal spray Place 1 spray into both nostrils daily as needed for allergies or rhinitis.    lisinopril (ZESTRIL) 10 MG tablet Take 10 mg by mouth daily.   metFORMIN (GLUCOPHAGE) 1000 MG tablet Take 1,000 mg by mouth 2 (two) times daily.   RABEprazole (ACIPHEX) 20 MG tablet TAKE 1 TABLET BY MOUTH EVERY DAY   traMADol (ULTRAM) 50 MG tablet Take 50 mg by mouth every 6 (six) hours as needed for moderate pain.    [DISCONTINUED] metFORMIN (GLUCOPHAGE) 500 MG tablet Take 500 mg by mouth 2 (two) times daily.   No facility-administered encounter medications on file as of 09/27/2019.    ALLERGIES: Allergies  Allergen Reactions   Asa [Aspirin] Anaphylaxis   Augmentin [Amoxicillin-Pot Clavulanate] Other (See Comments)    "Retching and pass out"   Levaquin [Levofloxacin In D5w] Itching and Other (See Comments)    "body turned red"   Lovenox [Enoxaparin Sodium] Other (See Comments)    phsyician instructed not to use ever again. "white veins popped out"    VACCINATION STATUS:  There is no immunization history on file for this patient.  Diabetes She presents for her initial diabetic visit. She has type 2 diabetes mellitus. Onset time: She was diagnosed at approximate age of 19 years, after several years of prediabetes. Her disease course has been worsening. There are no hypoglycemic associated symptoms.  Pertinent negatives for hypoglycemia include no confusion, headaches, pallor or seizures. Associated symptoms include fatigue, polydipsia and polyuria. Pertinent negatives for diabetes include no chest pain and no polyphagia. There are no hypoglycemic complications. Symptoms are worsening. There are no  diabetic complications. Risk factors for coronary artery disease include diabetes mellitus, dyslipidemia, family history, obesity, tobacco exposure, post-menopausal and sedentary lifestyle. Current diabetic treatments: She is on Metformin 1000 mg p.o. twice daily. Her weight is fluctuating minimally. She is following a generally unhealthy diet. When asked about meal planning, she reported none. She never participates in exercise. Her home blood glucose trend is decreasing steadily. (She reports decreasing blood glucose profile recently.  She did not bring any logs nor meter to review.  Her recent A1c was 9.2% in April 2021.) An ACE inhibitor/angiotensin II receptor blocker is being taken.  Hyperlipidemia Pertinent negatives include no chest pain, myalgias or shortness of breath.  Hypertension This is a chronic problem. The current episode started more than 1 year ago. The problem is controlled. Pertinent negatives include no chest pain, headaches, palpitations or shortness of breath. Risk factors for coronary artery disease include dyslipidemia, diabetes mellitus, obesity, smoking/tobacco exposure, sedentary lifestyle, post-menopausal state and family history. Past treatments include ACE inhibitors.     Review of Systems  Constitutional: Positive for fatigue. Negative for chills, fever and unexpected weight change.  HENT: Negative for trouble swallowing and voice change.   Eyes: Negative for visual disturbance.  Respiratory: Negative for cough, shortness of breath and wheezing.   Cardiovascular: Negative for chest pain, palpitations and leg swelling.  Gastrointestinal: Negative for diarrhea, nausea and  vomiting.  Endocrine: Positive for polydipsia and polyuria. Negative for cold intolerance, heat intolerance and polyphagia.  Musculoskeletal: Negative for arthralgias and myalgias.  Skin: Negative for color change, pallor, rash and wound.  Neurological: Negative for seizures and headaches.  Psychiatric/Behavioral: Negative for confusion and suicidal ideas.    Objective:    Vitals with BMI 09/27/2019 04/05/2019 03/16/2019  Height 5\' 4"  5\' 4"  5\' 4"   Weight 244 lbs 3 oz 251 lbs 249 lbs 13 oz  BMI 41.9 01.75 10.25  Systolic 852 778 242  Diastolic 80 82 84  Pulse 97 85 88    BP 135/80    Pulse 97    Ht 5\' 4"  (1.626 m)    Wt 244 lb 3.2 oz (110.8 kg)    LMP 09/06/2016    BMI 41.92 kg/m   Wt Readings from Last 3 Encounters:  09/27/19 244 lb 3.2 oz (110.8 kg)  04/05/19 251 lb (113.9 kg)  03/16/19 249 lb 12.8 oz (113.3 kg)     Physical Exam Constitutional:      Appearance: She is well-developed.  HENT:     Head: Normocephalic and atraumatic.  Neck:     Thyroid: No thyromegaly.     Trachea: No tracheal deviation.  Cardiovascular:     Rate and Rhythm: Normal rate and regular rhythm.  Pulmonary:     Effort: Pulmonary effort is normal.  Abdominal:     Tenderness: There is no abdominal tenderness. There is no guarding.  Musculoskeletal:        General: Normal range of motion.     Cervical back: Normal range of motion and neck supple.  Skin:    General: Skin is warm and dry.     Coloration: Skin is not pale.     Findings: No erythema or rash.  Neurological:     Mental Status: She is alert and oriented to person, place, and time.     Cranial Nerves: No cranial nerve deficit.     Coordination: Coordination normal.     Deep Tendon Reflexes: Reflexes are normal and symmetric.  Psychiatric:  Judgment: Judgment normal.     CMP ( most recent) CMP     Component Value Date/Time   NA 138 03/30/2019 0922   K 4.4 03/30/2019 0922   CL 101 03/30/2019 0922   CO2 28 03/30/2019  0922   GLUCOSE 184 (H) 03/30/2019 0922   BUN 12 07/23/2019 0000   CREATININE 0.1 (A) 07/23/2019 0000   CREATININE 0.84 03/30/2019 0922   CALCIUM 9.4 03/30/2019 0922   PROT 6.8 03/30/2019 0922   ALBUMIN 3.7 03/10/2019 1321   AST 41 (H) 03/30/2019 0922   ALT 39 (H) 03/30/2019 0922   ALKPHOS 82 03/10/2019 1321   BILITOT 0.4 03/30/2019 0922   GFRNONAA 83 07/23/2019 0000   GFRAA 96 07/23/2019 0000     Diabetic Labs (most recent): Lab Results  Component Value Date   HGBA1C 9.2 07/23/2019     Lab Results  Component Value Date   TSH 1.85 07/23/2019      Assessment & Plan:   1. Uncontrolled type 2 diabetes mellitus with hyperglycemia ([place])  - Kynedi Blakney has currently uncontrolled symptomatic type 2 DM since  59 years of age,  with most recent A1c of 9.2 %. Recent labs reviewed. - I had a long discussion with her about the progressive nature of diabetes and the pathology behind its complications. -her diabetes is complicated by obesity/sedentary life, active smoking and she remains at a high risk for more acute and chronic complications which include CAD, CVA, CKD, retinopathy, and neuropathy. These are all discussed in detail with her.  - I have counseled her on diet  and weight management  by adopting a carbohydrate restricted/protein rich diet. Patient is encouraged to switch to  unprocessed or minimally processed     complex starch and increased protein intake (animal or plant source), fruits, and vegetables. -  she is advised to stick to a routine mealtimes to eat 3 meals  a day and avoid unnecessary snacks ( to snack only to correct hypoglycemia).   - she admits that there is a room for improvement in her food and drink choices. - Suggestion is made for her to avoid simple carbohydrates  from her diet including Cakes, Sweet Desserts, Ice Cream, Soda (diet and regular), Sweet Tea, Candies, Chips, Cookies, Store Bought Juices, Alcohol in Excess of  1-2 drinks a day, Artificial  Sweeteners,  Coffee Creamer, and "Sugar-free" Products. This will help patient to have more stable blood glucose profile and potentially avoid unintended weight gain.  - she will be scheduled with Jearld Fenton, RDN, CDE for diabetes education.  - I have approached her with the following individualized plan to manage  her diabetes and patient agrees:   - she will likely require at least basal insulin in order for her to achieve control of diabetes to target. -In preparation, she is approached to start strict monitoring of glucose 2 times a day-before meals and at bedtime.  - she is encouraged to call clinic for blood glucose levels less than 70 or above 300 mg /dl. - she is advised to continue Metformin 1000 mg p.o. twice daily, therapeutically suitable for patient . -She has several options on the second agent if she cannot control her diabetes with Metformin by next visit. - she will be considered for incretin therapy as appropriate next visit.  - Specific targets for  A1c;  LDL, HDL,  and Triglycerides were discussed with the patient.  2) Blood Pressure /Hypertension:  her blood pressure is  controlled to  target.   she is advised to continue her current medications including lisinopril 20 mg p.o. daily with breakfast . 3) Lipids/Hyperlipidemia: She does not have recent lipid panel to review.  She is not on statins.  She will be considered for fasting lipid panel on subsequent visits and possibly statin initiation.   4)  Weight/Diet:  Body mass index is 41.92 kg/m.  -   clearly complicating her diabetes care.   she is  a candidate for weight loss. I discussed with her the fact that loss of 5 - 10% of her  current body weight will have the most impact on her diabetes management.  Exercise, and detailed carbohydrates information provided  -  detailed on discharge instructions.  5) Chronic Care/Health Maintenance:  -she  is on ACEI medications and  is encouraged to initiate and continue to  follow up with Ophthalmology, Dentist,  Podiatrist at least yearly or according to recommendations, and advised to  Quit smoking. I have recommended yearly flu vaccine and pneumonia vaccine at least every 5 years; moderate intensity exercise for up to 150 minutes weekly; and  sleep for at least 7 hours a day.  - she is  advised to maintain close follow up with Rosita Fire, MD for primary care needs, as well as her other providers for optimal and coordinated care.   - Time spent in this patient care: 60 min, of which > 50% was spent in  counseling  her about her currently uncontrolled type 2 diabetes, hyperlipidemia, hypertension and the rest reviewing her blood glucose logs , discussing her hypoglycemia and hyperglycemia episodes, reviewing her current and  previous labs / studies  ( including abstraction from other facilities) and medications  doses and developing a  long term treatment plan based on the latest standards of care/ guidelines; and documenting her care.    Please refer to Patient Instructions for Blood Glucose Monitoring and Insulin/Medications Dosing Guide"  in media tab for additional information. Please  also refer to " Patient Self Inventory" in the Media  tab for reviewed elements of pertinent patient history.  Fabienne Canto participated in the discussions, expressed understanding, and voiced agreement with the above plans.  All questions were answered to her satisfaction. she is encouraged to contact clinic should she have any questions or concerns prior to her return visit.   Follow up plan: - Return in about 5 weeks (around 11/01/2019) for F/U with Meter and Logs Only - no Labs.  Glade Lloyd, MD [facility] [address] [place], [place] [address] Phone: [phone]  Fax: [phone]    09/27/2019, 5:29 PM  This note was partially dictated with voice recognition software. Similar sounding words can be transcribed  inadequately or may not  be corrected upon review.

## 2019-09-28 ENCOUNTER — Encounter: Payer: Medicare Other | Admitting: Adult Health

## 2019-09-29 ENCOUNTER — Ambulatory Visit (HOSPITAL_COMMUNITY)
Admission: RE | Admit: 2019-09-29 | Discharge: 2019-09-29 | Disposition: A | Discharge status: Home / Self Care | Payer: Medicare Other | Source: Ambulatory Visit | Attending: Internal Medicine | Admitting: Internal Medicine

## 2019-09-29 ENCOUNTER — Other Ambulatory Visit: Payer: Self-pay

## 2019-09-29 DIAGNOSIS — Z1231 Encounter for screening mammogram for malignant neoplasm of breast: Secondary | ICD-10-CM | POA: Diagnosis not present

## 2019-09-30 ENCOUNTER — Telehealth: Payer: Self-pay

## 2019-09-30 NOTE — Telephone Encounter (Signed)
Spoke with pt. Pt notified of EG recommendations. Pt will try Beno or GasX. Pt will call back if she doesn't see any improvement.

## 2019-09-30 NOTE — Telephone Encounter (Signed)
Pt has been on Metformin for a while. Pt is Taking Rabeprazole 20 mg. Pt is starting to get a gas buildup around her upper abdomen 1 hour after she takes Rabeprazole 20 mg. Pt feels she has done well on the medication and isn't sure if there was a drug interaction with metformin and Rabeprazole. Pt was scheduled an apt on 10/2019 with LSL. Pt didn't know if she could take anything for gas in her upper abdomen. Pt reports no nausea, no vomiting, no feels of shortness of breath. Please advise in the absence of LSL.

## 2019-09-30 NOTE — Telephone Encounter (Signed)
Have her try Simethicone (Gas-X) or Beano and see if this helps. Call us if no improvement. Verified no significant DDI with Metformin and Rabeprazole (at least not related to this).

## 2019-10-01 ENCOUNTER — Ambulatory Visit (INDEPENDENT_AMBULATORY_CARE_PROVIDER_SITE_OTHER): Payer: Medicare Other | Admitting: Rheumatology

## 2019-10-01 ENCOUNTER — Encounter: Payer: Self-pay | Admitting: Rheumatology

## 2019-10-01 ENCOUNTER — Other Ambulatory Visit: Payer: Self-pay

## 2019-10-01 VITALS — BP 142/88 | HR 86 | Resp 17 | Ht 63.5 in | Wt 239.0 lb

## 2019-10-01 DIAGNOSIS — Z8719 Personal history of other diseases of the digestive system: Secondary | ICD-10-CM

## 2019-10-01 DIAGNOSIS — E119 Type 2 diabetes mellitus without complications: Secondary | ICD-10-CM

## 2019-10-01 DIAGNOSIS — R768 Other specified abnormal immunological findings in serum: Secondary | ICD-10-CM

## 2019-10-01 DIAGNOSIS — R945 Abnormal results of liver function studies: Secondary | ICD-10-CM | POA: Diagnosis not present

## 2019-10-01 DIAGNOSIS — M25561 Pain in right knee: Secondary | ICD-10-CM

## 2019-10-01 DIAGNOSIS — M25511 Pain in right shoulder: Secondary | ICD-10-CM | POA: Diagnosis not present

## 2019-10-01 DIAGNOSIS — M5442 Lumbago with sciatica, left side: Secondary | ICD-10-CM

## 2019-10-01 DIAGNOSIS — M25562 Pain in left knee: Secondary | ICD-10-CM

## 2019-10-01 DIAGNOSIS — E559 Vitamin D deficiency, unspecified: Secondary | ICD-10-CM

## 2019-10-01 DIAGNOSIS — M25551 Pain in right hip: Secondary | ICD-10-CM | POA: Diagnosis not present

## 2019-10-01 DIAGNOSIS — M5030 Other cervical disc degeneration, unspecified cervical region: Secondary | ICD-10-CM

## 2019-10-01 DIAGNOSIS — Z8709 Personal history of other diseases of the respiratory system: Secondary | ICD-10-CM

## 2019-10-01 DIAGNOSIS — G8929 Other chronic pain: Secondary | ICD-10-CM

## 2019-10-01 DIAGNOSIS — M25512 Pain in left shoulder: Secondary | ICD-10-CM

## 2019-10-01 DIAGNOSIS — R195 Other fecal abnormalities: Secondary | ICD-10-CM

## 2019-10-01 DIAGNOSIS — I10 Essential (primary) hypertension: Secondary | ICD-10-CM

## 2019-10-01 DIAGNOSIS — Q211 Atrial septal defect: Secondary | ICD-10-CM

## 2019-10-01 DIAGNOSIS — Q2112 Patent foramen ovale: Secondary | ICD-10-CM

## 2019-10-01 DIAGNOSIS — M25552 Pain in left hip: Secondary | ICD-10-CM

## 2019-10-01 DIAGNOSIS — M5441 Lumbago with sciatica, right side: Secondary | ICD-10-CM

## 2019-10-01 DIAGNOSIS — R7989 Other specified abnormal findings of blood chemistry: Secondary | ICD-10-CM

## 2019-10-01 DIAGNOSIS — K760 Fatty (change of) liver, not elsewhere classified: Secondary | ICD-10-CM

## 2019-10-01 DIAGNOSIS — Z8669 Personal history of other diseases of the nervous system and sense organs: Secondary | ICD-10-CM

## 2019-10-05 LAB — ANTI-DNA ANTIBODY, DOUBLE-STRANDED: ds DNA Ab: <1 IU/mL

## 2019-10-05 LAB — BETA-2 GLYCOPROTEIN ANTIBODIES
Beta-2 Glyco 1 IgA: 43.7 U/mL — ABNORMAL HIGH
Beta-2 Glyco 1 IgM: 3.2 U/mL
Beta-2 Glyco I IgG: <2 U/mL

## 2019-10-05 LAB — RNP ANTIBODY: Ribonucleic Protein(ENA) Antibody, IgG: <1 AI

## 2019-10-05 LAB — ANTI-NUCLEAR AB-TITER (ANA TITER)
ANA TITER: 1:40 {titer} — ABNORMAL HIGH
ANA Titer 1: 1:40 {titer} — ABNORMAL HIGH

## 2019-10-05 LAB — CARDIOLIPIN ANTIBODIES, IGG, IGM, IGA
Anticardiolipin IgA: 34.3 APL-U/mL — ABNORMAL HIGH
Anticardiolipin IgG: <2 GPL-U/mL
Anticardiolipin IgM: 2.8 MPL-U/mL

## 2019-10-05 LAB — SJOGRENS SYNDROME-A EXTRACTABLE NUCLEAR ANTIBODY: SSA (Ro) (ENA) Antibody, IgG: <1 AI

## 2019-10-05 LAB — ANTI-SMITH ANTIBODY: ENA SM Ab Ser-aCnc: <1 AI

## 2019-10-05 LAB — SJOGRENS SYNDROME-B EXTRACTABLE NUCLEAR ANTIBODY: SSB (La) (ENA) Antibody, IgG: <1 AI

## 2019-10-05 LAB — ANA: Anti Nuclear Antibody (ANA): POSITIVE — AB

## 2019-10-05 LAB — C3 AND C4
C3 Complement: 159 mg/dL (ref 83–193)
C4 Complement: 45 mg/dL (ref 15–57)

## 2019-10-05 LAB — ANTI-SCLERODERMA ANTIBODY: Scleroderma (Scl-70) (ENA) Antibody, IgG: <1 AI

## 2019-10-06 ENCOUNTER — Other Ambulatory Visit (HOSPITAL_COMMUNITY)
Admission: RE | Admit: 2019-10-06 | Discharge: 2019-10-06 | Disposition: A | Discharge status: Home / Self Care | Payer: Medicare Other | Source: Ambulatory Visit | Attending: Adult Health | Admitting: Adult Health

## 2019-10-06 ENCOUNTER — Encounter: Payer: Self-pay | Admitting: Adult Health

## 2019-10-06 ENCOUNTER — Ambulatory Visit (INDEPENDENT_AMBULATORY_CARE_PROVIDER_SITE_OTHER): Payer: Medicare Other | Admitting: Adult Health

## 2019-10-06 VITALS — BP 149/72 | HR 89 | Ht 63.5 in | Wt 243.2 lb

## 2019-10-06 DIAGNOSIS — Z124 Encounter for screening for malignant neoplasm of cervix: Secondary | ICD-10-CM | POA: Diagnosis not present

## 2019-10-06 DIAGNOSIS — Z1211 Encounter for screening for malignant neoplasm of colon: Secondary | ICD-10-CM | POA: Insufficient documentation

## 2019-10-06 DIAGNOSIS — Z1151 Encounter for screening for human papillomavirus (HPV): Secondary | ICD-10-CM | POA: Insufficient documentation

## 2019-10-06 DIAGNOSIS — Z01419 Encounter for gynecological examination (general) (routine) without abnormal findings: Secondary | ICD-10-CM

## 2019-10-06 LAB — HEMOCCULT GUIAC POC 1CARD (OFFICE): Fecal Occult Blood, POC: NEGATIVE

## 2019-10-06 NOTE — Progress Notes (Signed)
Patient ID: Wendy Kramer, female   DOB: 1960-09-18, 59 y.o.   MRN: [id number] History of Present Illness: Wendy Kramer is a 59 year old white female,separated, PM, in for pelvic and pap. PCP is Dr Legrand Rams.   Current Medications, Allergies, Past Medical History, Past Surgical History, Family History and Social History were reviewed in [employer] record.     Review of Systems: Patient denies any headaches, hearing loss, fatigue, blurred vision, shortness of breath, chest pain, abdominal pain, problems with bowel movements, urination, or intercourse(not active). No joint pain or mood swings. No vaginal bleeding since stopping periods    Physical Exam:BP (!) 149/72 (BP Location: Left Arm, Patient Position: Sitting, Cuff Size: Large)   Pulse 89   Ht 5' 3.5" (1.613 m)   Wt 243 lb 3.2 oz (110.3 kg)   LMP 09/06/2016   BMI 42.41 kg/m  General:  Well developed, well nourished, no acute distress Skin:  Warm and dry Neck:  Midline trachea, normal thyroid, good ROM, no lymphadenopathy Lungs; Clear to auscultation bilaterally Cardiovascular: Regular rate and rhythm Pelvic:  External genitalia is normal in appearance, no lesions.  The vagina is pale with loss of moisture and rugae. Urethra has no lesions or masses. The cervix is bulbous, pap with high risk HPV 16/18 genotyping performed.Marland Kitchen  Uterus is felt to be normal size, shape, and contour.  No adnexal masses or tenderness noted.Bladder is non tender, no masses felt. Rectal: Good sphincter tone, no polyps, or hemorrhoids felt.  Hemoccult negative. Extremities/musculoskeletal:  No swelling,+ varicosities noted, L>R, no clubbing or cyanosis Psych:  No mood changes, alert and cooperative,seems happy AA is 0 Fall risk is moderated PHQ 9 score is 1 Examination chaperoned by Dwyane Dee LPN  Impression and Plan: 1. Routine cervical smear Pap sent  2. Encounter for gynecological examination with Papanicolaou smear of cervix Pap  sent, pap in 3 years if normal Physical and labs with PCP Mammogram yearly, just had 09/29/19 was negative for malignancy  Decrease smoking   3. Encounter for screening fecal occult blood testing Colonoscopy per GI,last one 03/05/2018.

## 2019-10-07 LAB — CYTOLOGY - PAP
Adequacy: ABSENT
Comment: NEGATIVE
Diagnosis: NEGATIVE
High risk HPV: NEGATIVE

## 2019-10-13 DIAGNOSIS — I10 Essential (primary) hypertension: Secondary | ICD-10-CM | POA: Diagnosis not present

## 2019-10-13 DIAGNOSIS — E119 Type 2 diabetes mellitus without complications: Secondary | ICD-10-CM | POA: Diagnosis not present

## 2019-10-21 ENCOUNTER — Telehealth: Payer: Self-pay | Admitting: Rheumatology

## 2019-10-21 NOTE — Telephone Encounter (Signed)
Patient left a voicemail requesting a return call to discuss the results of her labwork.

## 2019-10-21 NOTE — Telephone Encounter (Signed)
Left message to advise patient her labs results will be discussed at her new patient follow up visit.

## 2019-10-29 NOTE — Progress Notes (Signed)
Office Visit Note  Patient: Wendy Kramer             Date of Birth: Dec 27, 1960           MRN: [id number]             PCP: Rosita Fire, MD Referring: Rosita Fire, MD Visit Date: 11/03/2019 Occupation: @GUAROCC @  Subjective:  Positive ANA and joint pain.   History of Present Illness: Wendy Kramer is a 59 y.o. female with history of osteoarthritis.  She was evaluated for positive ANA.  She denies any history of oral ulcers, nasal ulcers, malar rash, photosensitivity, sicca symptoms, Raynaud's.  She denies any joint swelling.  She continues to have a lot of pain and discomfort in her shoulder joints, knee joints, hip joints, neck and lower back for which she goes to pain management.  Activities of Daily Living:  Patient reports morning stiffness for 30 minutes.   Patient Reports nocturnal pain.  Difficulty dressing/grooming: Denies Difficulty climbing stairs: Reports Difficulty getting out of chair: Reports Difficulty using hands for taps, buttons, cutlery, and/or writing: Reports  Review of Systems  Constitutional: Positive for fatigue.  HENT: Positive for mouth dryness. Negative for mouth sores and nose dryness.   Eyes: Negative for itching and dryness.  Respiratory: Positive for shortness of breath. Negative for difficulty breathing.   Cardiovascular: Negative for chest pain and palpitations.  Gastrointestinal: Positive for diarrhea. Negative for blood in stool and constipation.  Endocrine: Negative for increased urination.  Genitourinary: Negative for difficulty urinating.  Musculoskeletal: Positive for arthralgias, joint pain, myalgias, morning stiffness, muscle tenderness and myalgias. Negative for joint swelling.  Skin: Negative for color change, rash and redness.  Allergic/Immunologic: Negative for susceptible to infections.  Neurological: Positive for numbness and weakness. Negative for dizziness, headaches and memory loss.  Hematological: Positive for  bruising/bleeding tendency.  Psychiatric/Behavioral: Negative for confusion.    PMFS History:  Patient Active Problem List   Diagnosis Date Noted  . Encounter for gynecological examination with Papanicolaou smear of cervix 10/06/2019  . Routine cervical smear 10/06/2019  . Encounter for screening fecal occult blood testing 10/06/2019  . Uncontrolled type 2 diabetes mellitus with hyperglycemia ([place]) 09/27/2019  . Mixed hyperlipidemia 09/27/2019  . Morbid obesity ([place]) 09/27/2019  . Abnormal LFTs 03/16/2019  . Diabetes mellitus without complication ([place])   . Positive colorectal cancer screening using Cologuard test 12/24/2017  . Abdominal pain, epigastric 12/24/2017  . GERD (gastroesophageal reflux disease) 12/24/2017  . Fatty liver 12/24/2017    Past Medical History:  Diagnosis Date  . Arthritis   . Cardiac murmur   . Cervical spine disease   . Diabetes mellitus without complication (H[facility])    diet controlled  . Exogenous hyperlipidemia   . History of kidney stones   . HTN (hypertension)   . Morbid obesity due to excess calories ([place])   . Other chronic pain   . Panic disorder   . Patent foramen ovale    TEE 2018  . Prediabetes   . Sleep apnea   . Stroke [facility])    "mini-stroke" no deficits from this.  Marland Kitchen Unspecified asthma with (acute) exacerbation   . Vitamin D deficiency     Family History  Problem Relation Age of Onset  . Breast cancer Maternal Aunt   . Colon cancer Other        maternal great grandfather  . Liver disease Brother        Fatty liver  . HIV Brother   .  Hypertension Mother   . Hyperlipidemia Mother   . Hypertension Father   . Thyroid disease Sister   . Pancreatic cancer Sister   . Healthy Daughter   . Healthy Son   . Healthy Son   . Heart disease Son    Past Surgical History:  Procedure Laterality Date  . BIOPSY  03/05/2018   Procedure: BIOPSY;  Surgeon: Daneil Dolin, MD;  Location: [facility];  Service: Endoscopy;;  gastric  .  CHOLECYSTECTOMY    . COLONOSCOPY WITH PROPOFOL N/A 03/05/2018   Dr. Gala Romney: Diverticulosis in the sigmoid and descending colon.  3 colon polyps removed, 2 hyperplastic and one sessile serrated polyp.  Next colonoscopy 3 years.  . ESOPHAGOGASTRODUODENOSCOPY (EGD) WITH PROPOFOL N/A 03/05/2018   Dr. Gala Romney: Erosive reflux esophagitis, small hiatal hernia, erosive gastropathy with reactive gastropathy on biopsy.  No H. pylori.  Marland Kitchen HERNIA REPAIR     umbilical  . POLYPECTOMY  03/05/2018   Procedure: POLYPECTOMY;  Surgeon: Daneil Dolin, MD;  Location: [facility];  Service: Endoscopy;;  colon  . SHOULDER ARTHROSCOPY Right   . TEE WITHOUT CARDIOVERSION  2018   patent foramen ovale  . TONSILLECTOMY AND ADENOIDECTOMY    . TUBAL LIGATION    = Social History   Social History Narrative  . Not on file    There is no immunization history on file for this patient.   Objective: Vital Signs: BP (!) 160/90 (BP Location: Left Wrist, Patient Position: Sitting, Cuff Size: Normal)   Pulse (!) 101   Resp 18   Ht 5\' 3"  (1.6 m)   Wt 242 lb 3.2 oz (109.9 kg)   LMP 09/06/2016   BMI 42.90 kg/m    Physical Exam Vitals and nursing note reviewed.  Constitutional:      Appearance: She is well-developed.  HENT:     Head: Normocephalic and atraumatic.  Eyes:     Conjunctiva/sclera: Conjunctivae normal.  Cardiovascular:     Rate and Rhythm: Normal rate and regular rhythm.     Heart sounds: Normal heart sounds.  Pulmonary:     Effort: Pulmonary effort is normal.     Breath sounds: Normal breath sounds.  Abdominal:     General: Bowel sounds are normal.     Palpations: Abdomen is soft.  Musculoskeletal:     Cervical back: Normal range of motion.  Lymphadenopathy:     Cervical: No cervical adenopathy.  Skin:    General: Skin is warm and dry.     Capillary Refill: Capillary refill takes less than 2 seconds.  Neurological:     Mental Status: She is alert and oriented to person, place, and time.    Psychiatric:        Behavior: Behavior normal.      Musculoskeletal Exam: She has limited painful range of motion of her cervical lumbar spine.  She has limited range of motion of bilateral shoulder joints with discomfort.  Elbow joints, wrist joints, MCPs PIPs and DIPs with good range of motion with no synovitis.  She had good range of motion of her hip joints and knee joints with some discomfort.  No synovitis was noted.  CDAI Exam: CDAI Score: -- Patient Global: --; Provider Global: -- Swollen: --; Tender: -- Joint Exam 11/03/2019   No joint exam has been documented for this visit   There is currently no information documented on the homunculus. Go to the Rheumatology activity and complete the homunculus joint exam.  Investigation: No  additional findings.  Imaging: No results found.  Recent Labs: Lab Results  Component Value Date   WBC 10.0 03/10/2019   HGB 16.7 (H) 03/10/2019   PLT 221 03/10/2019   NA 138 03/30/2019   K 4.4 03/30/2019   CL 101 03/30/2019   CO2 28 03/30/2019   GLUCOSE 184 (H) 03/30/2019   BUN 12 07/23/2019   CREATININE 0.1 (A) 07/23/2019   BILITOT 0.4 03/30/2019   ALKPHOS 82 03/10/2019   AST 41 (H) 03/30/2019   ALT 39 (H) 03/30/2019   PROT 6.8 03/30/2019   ALBUMIN 3.7 03/10/2019   CALCIUM 9.4 03/30/2019   GFRAA 96 07/22/2019   October 06, 2019 ANA 1: 40 speckled, ENA negative, C3-C4 normal, anticardiolipin IgA positive, beta-2 GP 1 IgA positive, lupus anticoagulant pending Speciality Comments: No specialty comments available.  Procedures:  No procedures performed Allergies: Asa [aspirin], Augmentin [amoxicillin-pot clavulanate], Levaquin [levofloxacin in d5w], and Lovenox [enoxaparin sodium]   Assessment / Plan:     Visit Diagnoses: Positive ANA (antinuclear antibody) -ANA is low titer and not significant.  ENA was negative.  Complements were normal.  Beta-2 IgA and anticardiolipin IgA were low titer positive.  Which are usually not significant.   Patient is already on Plavix.  She has no clinical features of lupus.  I discussed repeating antibodies in 3 months.  Plan: Beta-2 glycoprotein antibodies, Cardiolipin antibodies, IgG, IgM, IgA, Lupus Anticoagulant Eval w/Reflex  Abnormal LFTs-she has chronic elevation of LFTs.  Chronic pain of both shoulders - Right rotator cuff tear 2011  Chronic pain of both hips - Followed by pain management  Chronic pain of both knees - Patient is followed by pain management.  DDD (degenerative disc disease), cervical - With moderate to severe spinal stenosis.  Chronic midline low back pain with bilateral sciatica  Essential hypertension  Patent foramen ovale  Diabetes mellitus without complication (HCC)  History of gastroesophageal reflux (GERD)  Fatty liver  History of asthma  History of sleep apnea  Vitamin D deficiency  Positive colorectal cancer screening using Cologuard test  Orders: No orders of the defined types were placed in this encounter.  No orders of the defined types were placed in this encounter.    Follow-Up Instructions: Return in about 3 months (around 02/03/2020) for +ANA.   Bo Merino, MD  Note - This record has been created using Editor, commissioning.  Chart creation errors have been sought, but may not always  have been located. Such creation errors do not reflect on  the standard of medical care.

## 2019-11-02 ENCOUNTER — Other Ambulatory Visit: Payer: Self-pay | Admitting: "Endocrinology

## 2019-11-02 ENCOUNTER — Encounter: Payer: Self-pay | Admitting: "Endocrinology

## 2019-11-02 ENCOUNTER — Ambulatory Visit (INDEPENDENT_AMBULATORY_CARE_PROVIDER_SITE_OTHER): Payer: Medicare Other | Admitting: "Endocrinology

## 2019-11-02 ENCOUNTER — Telehealth: Payer: Self-pay | Admitting: "Endocrinology

## 2019-11-02 ENCOUNTER — Other Ambulatory Visit: Payer: Self-pay

## 2019-11-02 ENCOUNTER — Ambulatory Visit: Payer: Medicare Other | Admitting: Nutrition

## 2019-11-02 VITALS — BP 110/82 | HR 80 | Ht 63.5 in | Wt 242.2 lb

## 2019-11-02 DIAGNOSIS — E1165 Type 2 diabetes mellitus with hyperglycemia: Secondary | ICD-10-CM | POA: Diagnosis not present

## 2019-11-02 DIAGNOSIS — E782 Mixed hyperlipidemia: Secondary | ICD-10-CM

## 2019-11-02 LAB — POCT GLYCOSYLATED HEMOGLOBIN (HGB A1C): Hemoglobin A1C: 7.5 % — AB (ref 4.0–5.6)

## 2019-11-02 MED ORDER — ACCU-CHEK GUIDE ME W/DEVICE KIT
1.0000 | PACK | 0 refills | Status: AC
Start: 2019-11-02 — End: ?

## 2019-11-02 MED ORDER — ACCU-CHEK GUIDE VI STRP
ORAL_STRIP | 1 refills | Status: DC
Start: 2019-11-02 — End: 2019-11-08

## 2019-11-02 NOTE — Telephone Encounter (Signed)
I would like to send in a new meter and strips for her.

## 2019-11-02 NOTE — Telephone Encounter (Signed)
Pt uses a Prodigy Meter.

## 2019-11-02 NOTE — Patient Instructions (Signed)

## 2019-11-02 NOTE — Progress Notes (Signed)
11/02/2019, 9:00 AM   Endocrinology follow-up note  Subjective:    Patient ID: Wendy Kramer, female    DOB: January 16, 1961.  Wendy Kramer is being seen in follow-up after she was seen in consultation for management of currently uncontrolled symptomatic diabetes requested by  Rosita Fire, MD.   Past Medical History:  Diagnosis Date  . Arthritis   . Cardiac murmur   . Cervical spine disease   . Diabetes mellitus without complication (HCC)    diet controlled  . Exogenous hyperlipidemia   . History of kidney stones   . HTN (hypertension)   . Morbid obesity due to excess calories ([place])   . Other chronic pain   . Panic disorder   . Patent foramen ovale    TEE 2018  . Prediabetes   . Sleep apnea   . Stroke [facility])    "mini-stroke" no deficits from this.  Marland Kitchen Unspecified asthma with (acute) exacerbation   . Vitamin D deficiency     Past Surgical History:  Procedure Laterality Date  . BIOPSY  03/05/2018   Procedure: BIOPSY;  Surgeon: Daneil Dolin, MD;  [facility]: [facility];  Service: Endoscopy;;  gastric  . CHOLECYSTECTOMY    . COLONOSCOPY WITH PROPOFOL N/A 03/05/2018   Dr. Gala Romney: Diverticulosis in the sigmoid and descending colon.  3 colon polyps removed, 2 hyperplastic and one sessile serrated polyp.  Next colonoscopy 3 years.  . ESOPHAGOGASTRODUODENOSCOPY (EGD) WITH PROPOFOL N/A 03/05/2018   Dr. Gala Romney: Erosive reflux esophagitis, small hiatal hernia, erosive gastropathy with reactive gastropathy on biopsy.  No H. pylori.  Marland Kitchen HERNIA REPAIR     umbilical  . POLYPECTOMY  03/05/2018   Procedure: POLYPECTOMY;  Surgeon: Daneil Dolin, MD;  Location: [facility];  Service: Endoscopy;;  colon  . SHOULDER ARTHROSCOPY Right   . TEE WITHOUT CARDIOVERSION  2018   patent foramen ovale  . TONSILLECTOMY AND ADENOIDECTOMY    . TUBAL LIGATION      Social History   Socioeconomic History  . Marital  status: Legally Separated    Spouse name: Not on file  . Number of children: Not on file  . Years of education: Not on file  . Highest education level: Not on file  Occupational History  . Not on file  Tobacco Use  . Smoking status: Current Every Day Smoker    Packs/day: 0.50    Years: 40.00    Pack years: 20.00    Types: Cigarettes  . Smokeless tobacco: Never Used  Vaping Use  . Vaping Use: Never used  Substance and Sexual Activity  . Alcohol use: No  . Drug use: No  . Sexual activity: Not Currently    Birth control/protection: Post-menopausal, Surgical    Comment: tubal  Other Topics Concern  . Not on file  Social History Narrative  . Not on file   Social Determinants of Health   Financial Resource Strain: High Risk  . Difficulty of Paying Living Expenses: Hard  Food Insecurity: No Food Insecurity  . Worried About Charity fundraiser in the Last Year: Never true  .  Ran Out of Food in the Last Year: Never true  Transportation Needs: No Transportation Needs  . Lack of Transportation (Medical): No  . Lack of Transportation (Non-Medical): No  Physical Activity: Inactive  . Days of Exercise per Week: 0 days  . Minutes of Exercise per Session: 0 min  Stress: Stress Concern Present  . Feeling of Stress : To some extent  Social Connections: Socially Isolated  . Frequency of Communication with Friends and Family: More than three times a week  . Frequency of Social Gatherings with Friends and Family: More than three times a week  . Attends Religious Services: Never  . Active Member of Clubs or Organizations: No  . Attends Archivist Meetings: Never  . Marital Status: Separated    Family History  Problem Relation Age of Onset  . Breast cancer Maternal Aunt   . Colon cancer Other        maternal great grandfather  . Liver disease Brother        Fatty liver  . HIV Brother   . Hypertension Mother   . Hyperlipidemia Mother   . Hypertension Father   . Thyroid  disease Sister   . Pancreatic cancer Sister   . Healthy Daughter   . Healthy Son   . Healthy Son   . Heart disease Son     Outpatient Encounter Medications as of 11/02/2019  Medication Sig  . acetaminophen (TYLENOL) 500 MG tablet Take 500 mg by mouth every 6 (six) hours as needed.  Marland Kitchen albuterol (PROVENTIL HFA;VENTOLIN HFA) 108 (90 Base) MCG/ACT inhaler Inhale 1-2 puffs into the lungs every 6 (six) hours as needed for wheezing or shortness of breath.  . ALPRAZolam (XANAX) 0.5 MG tablet Take 0.25 mg by mouth daily as needed for anxiety.   . budesonide-formoterol (SYMBICORT) 80-4.5 MCG/ACT inhaler Inhale 1 puff into the lungs 2 (two) times daily.   . Cholecalciferol (VITAMIN D3) 50 MCG (2000 UT) TABS Take 2,000 Units by mouth daily.   . clopidogrel (PLAVIX) 75 MG tablet Take 1 tablet (75 mg total) by mouth daily. (Patient taking differently: Take 75 mg by mouth every other day. )  . cyclobenzaprine (FLEXERIL) 5 MG tablet Take 5 mg by mouth daily as needed for muscle spasms.   . fluticasone (FLONASE) 50 MCG/ACT nasal spray Place 1 spray into both nostrils daily as needed for allergies or rhinitis.   Marland Kitchen lisinopril (ZESTRIL) 20 MG tablet Take 20 mg by mouth daily.  . metFORMIN (GLUCOPHAGE) 1000 MG tablet Take 1,000 mg by mouth 2 (two) times daily.  . Multiple Vitamin (MULTIVITAMIN ADULT PO) Take 1 tablet by mouth daily.  . RABEprazole (ACIPHEX) 20 MG tablet TAKE 1 TABLET BY MOUTH EVERY DAY  . traMADol (ULTRAM) 50 MG tablet Take 50 mg by mouth every 6 (six) hours as needed for moderate pain.   . [DISCONTINUED] lisinopril (ZESTRIL) 10 MG tablet Take 10 mg by mouth daily.   No facility-administered encounter medications on file as of 11/02/2019.    ALLERGIES: Allergies  Allergen Reactions  . Asa [Aspirin] Anaphylaxis  . Augmentin [Amoxicillin-Pot Clavulanate] Other (See Comments)    "Retching and pass out"  . Levaquin [Levofloxacin In D5w] Itching and Other (See Comments)    "body turned red"  .  Lovenox [Enoxaparin Sodium] Other (See Comments)    phsyician instructed not to use ever again. "white veins popped out"    VACCINATION STATUS:  There is no immunization history on file for this patient.  Diabetes She presents for her follow-up diabetic visit. She has type 2 diabetes mellitus. Onset time: She was diagnosed at approximate age of 46 years, after several years of prediabetes. Her disease course has been improving. There are no hypoglycemic associated symptoms. Pertinent negatives for hypoglycemia include no confusion, headaches, pallor or seizures. Pertinent negatives for diabetes include no chest pain, no fatigue, no polydipsia, no polyphagia and no polyuria. There are no hypoglycemic complications. Symptoms are improving. There are no diabetic complications. Risk factors for coronary artery disease include diabetes mellitus, dyslipidemia, family history, obesity, tobacco exposure, post-menopausal and sedentary lifestyle. Current diabetic treatments: She is on Metformin 1000 mg p.o. twice daily. Her weight is fluctuating minimally. She is following a generally unhealthy diet. When asked about meal planning, she reported none. She never participates in exercise. Her overall blood glucose range is 140-180 mg/dl. (She reports glycemic profile between 115-145, and point-of-care A1c 7.5% today improving from 9.2%.  She denies hypoglycemia.   ) An ACE inhibitor/angiotensin II receptor blocker is being taken.  Hyperlipidemia Pertinent negatives include no chest pain, myalgias or shortness of breath.  Hypertension This is a chronic problem. The current episode started more than 1 year ago. The problem is controlled. Pertinent negatives include no chest pain, headaches, palpitations or shortness of breath. Risk factors for coronary artery disease include dyslipidemia, diabetes mellitus, obesity, smoking/tobacco exposure, sedentary lifestyle, post-menopausal state and family history. Past  treatments include ACE inhibitors.     Review of Systems  Constitutional: Negative for chills, fatigue, fever and unexpected weight change.  HENT: Negative for trouble swallowing and voice change.   Eyes: Negative for visual disturbance.  Respiratory: Negative for cough, shortness of breath and wheezing.   Cardiovascular: Negative for chest pain, palpitations and leg swelling.  Gastrointestinal: Negative for diarrhea, nausea and vomiting.  Endocrine: Negative for cold intolerance, heat intolerance, polydipsia, polyphagia and polyuria.  Musculoskeletal: Negative for arthralgias and myalgias.  Skin: Negative for color change, pallor, rash and wound.  Neurological: Negative for seizures and headaches.  Psychiatric/Behavioral: Negative for confusion and suicidal ideas.    Objective:    Vitals with BMI 11/02/2019 10/06/2019 10/01/2019  Height 5' 3.5" 5' 3.5" -  Weight 242 lbs 3 oz 243 lbs 3 oz -  BMI 02.77 41.2 -  Systolic 878 676 720  Diastolic 82 72 88  Pulse 80 89 86    BP 110/82   Pulse 80   Ht 5' 3.5" (1.613 m)   Wt 242 lb 3.2 oz (109.9 kg)   LMP 09/06/2016   BMI 42.23 kg/m   Wt Readings from Last 3 Encounters:  11/02/19 242 lb 3.2 oz (109.9 kg)  10/06/19 243 lb 3.2 oz (110.3 kg)  10/01/19 239 lb (108.4 kg)     Physical Exam Constitutional:      Appearance: She is well-developed.  HENT:     Head: Normocephalic and atraumatic.  Neck:     Thyroid: No thyromegaly.     Trachea: No tracheal deviation.  Cardiovascular:     Rate and Rhythm: Normal rate and regular rhythm.  Pulmonary:     Effort: Pulmonary effort is normal.  Abdominal:     Tenderness: There is no abdominal tenderness. There is no guarding.  Musculoskeletal:        General: Normal range of motion.     Cervical back: Normal range of motion and neck supple.  Skin:    General: Skin is warm and dry.     Coloration: Skin is not  pale.     Findings: No erythema or rash.  Neurological:     Mental Status: She  is alert and oriented to person, place, and time.     Cranial Nerves: No cranial nerve deficit.     Coordination: Coordination normal.     Deep Tendon Reflexes: Reflexes are normal and symmetric.  Psychiatric:        Judgment: Judgment normal.     CMP ( most recent) CMP     Component Value Date/Time   NA 138 03/30/2019 0922   K 4.4 03/30/2019 0922   CL 101 03/30/2019 0922   CO2 28 03/30/2019 0922   GLUCOSE 184 (H) 03/30/2019 0922   BUN 12 07/23/2019 0000   CREATININE 0.1 (A) 07/23/2019 0000   CREATININE 0.84 03/30/2019 0922   CALCIUM 9.4 03/30/2019 0922   PROT 6.8 03/30/2019 0922   ALBUMIN 3.7 03/10/2019 1321   AST 41 (H) 03/30/2019 0922   ALT 39 (H) 03/30/2019 0922   ALKPHOS 82 03/10/2019 1321   BILITOT 0.4 03/30/2019 0922   GFRNONAA 83 07/22/2019 0000   GFRAA 96 07/22/2019 0000     Diabetic Labs (most recent): Lab Results  Component Value Date   HGBA1C 7.5 (A) 11/02/2019   HGBA1C 9.2 07/23/2019     Lab Results  Component Value Date   TSH 1.85 07/23/2019      Assessment & Plan:   1. Uncontrolled type 2 diabetes mellitus with hyperglycemia ([place])  - Amiree Sherley has currently uncontrolled symptomatic type 2 DM since  59 years of age.  She reports glycemic profile between 115-145, and point-of-care A1c 7.5% today improving from 9.2%.  She denies hypoglycemia.  - I had a long discussion with her about the progressive nature of diabetes and the pathology behind its complications. -her diabetes is complicated by obesity/sedentary life, active smoking and she remains at a high risk for more acute and chronic complications which include CAD, CVA, CKD, retinopathy, and neuropathy. These are all discussed in detail with her.  - I have counseled her on diet  and weight management  by adopting a carbohydrate restricted/protein rich diet. Patient is encouraged to switch to  unprocessed or minimally processed     complex starch and increased protein intake (animal or  plant source), fruits, and vegetables. -  she is advised to stick to a routine mealtimes to eat 3 meals  a day and avoid unnecessary snacks ( to snack only to correct hypoglycemia).   - she  admits there is a room for improvement in her diet and drink choices. -  Suggestion is made for her to avoid simple carbohydrates  from her diet including Cakes, Sweet Desserts / Pastries, Ice Cream, Soda (diet and regular), Sweet Tea, Candies, Chips, Cookies, Sweet Pastries,  Store Bought Juices, Alcohol in Excess of  1-2 drinks a day, Artificial Sweeteners, Coffee Creamer, and "Sugar-free" Products. This will help patient to have stable blood glucose profile and potentially avoid unintended weight gain.   - she will be scheduled with Jearld Fenton, RDN, CDE for diabetes education.  - I have approached her with the following individualized plan to manage  her diabetes and patient agrees:   -Based on her presentation with controlled glycemic profile at fasting and point-of-care A1c of 7.5%, she would not need insulin treatment at this time.   -She is willing to continue monitoring blood glucose at least once a day before breakfast.     - she is encouraged to call clinic  for blood glucose levels less than 70 or above 200 mg /dl. - she is advised to continue Metformin 1000 mg p.o. twice daily, therapeutically suitable for patient . -She has several options on the second agent if she cannot control her diabetes with Metformin by next visit. - she will be considered for incretin therapy as appropriate next visit.  - Specific targets for  A1c;  LDL, HDL,  and Triglycerides were discussed with the patient.  2) Blood Pressure /Hypertension:   Her blood pressure is controlled to target.   she is advised to continue her current medications including lisinopril 20 mg p.o. daily with breakfast . 3) Lipids/Hyperlipidemia: She does not have recent lipid panel to review.  She is not on statins.  She will be considered  for fasting lipid panel before her next visit.    4)  Weight/Diet:  Body mass index is 42.23 kg/m.  -   clearly complicating her diabetes care.   she is  a candidate for weight loss. I discussed with her the fact that loss of 5 - 10% of her  current body weight will have the most impact on her diabetes management.  Exercise, and detailed carbohydrates information provided  -  detailed on discharge instructions.  5) Chronic Care/Health Maintenance:  -she  is on ACEI medications and  is encouraged to initiate and continue to follow up with Ophthalmology, Dentist,  Podiatrist at least yearly or according to recommendations, and advised to  Quit smoking. I have recommended yearly flu vaccine and pneumonia vaccine at least every 5 years; moderate intensity exercise for up to 150 minutes weekly; and  sleep for at least 7 hours a day.  - she is  advised to maintain close follow up with Rosita Fire, MD for primary care needs, as well as her other providers for optimal and coordinated care.  - Time spent on this patient care encounter:  35 min, of which > 50% was spent in  counseling and the rest reviewing her blood glucose logs , discussing her hypoglycemia and hyperglycemia episodes, reviewing her current and  previous labs / studies  ( including abstraction from other facilities) and medications  doses and developing a  long term treatment plan and documenting her care.   Please refer to Patient Instructions for Blood Glucose Monitoring and Insulin/Medications Dosing Guide"  in media tab for additional information. Please  also refer to " Patient Self Inventory" in the Media  tab for reviewed elements of pertinent patient history.  Quinteria Tal participated in the discussions, expressed understanding, and voiced agreement with the above plans.  All questions were answered to her satisfaction. she is encouraged to contact clinic should she have any questions or concerns prior to her return  visit.   Follow up plan: - Return in about 3 months (around 02/02/2020) for NV A1c in Office, Bring Meter and Logs- A1c in Office, NV Office Urine MA, NV with [place].  Glade Lloyd, MD [facility] [address] [place], [place] [address] Phone: [phone]  Fax: [phone]    11/02/2019, 9:00 AM  This note was partially dictated with voice recognition software. Similar sounding words can be transcribed inadequately or may not  be corrected upon review.

## 2019-11-03 ENCOUNTER — Encounter: Payer: Self-pay | Admitting: Rheumatology

## 2019-11-03 ENCOUNTER — Other Ambulatory Visit: Payer: Self-pay

## 2019-11-03 ENCOUNTER — Telehealth: Payer: Self-pay | Admitting: "Endocrinology

## 2019-11-03 ENCOUNTER — Ambulatory Visit (INDEPENDENT_AMBULATORY_CARE_PROVIDER_SITE_OTHER): Payer: Medicare Other | Admitting: Rheumatology

## 2019-11-03 VITALS — BP 160/90 | HR 101 | Resp 18 | Ht 63.0 in | Wt 242.2 lb

## 2019-11-03 DIAGNOSIS — M25561 Pain in right knee: Secondary | ICD-10-CM

## 2019-11-03 DIAGNOSIS — Z8719 Personal history of other diseases of the digestive system: Secondary | ICD-10-CM | POA: Diagnosis not present

## 2019-11-03 DIAGNOSIS — K760 Fatty (change of) liver, not elsewhere classified: Secondary | ICD-10-CM | POA: Diagnosis not present

## 2019-11-03 DIAGNOSIS — R768 Other specified abnormal immunological findings in serum: Secondary | ICD-10-CM | POA: Diagnosis not present

## 2019-11-03 DIAGNOSIS — M25511 Pain in right shoulder: Secondary | ICD-10-CM | POA: Diagnosis not present

## 2019-11-03 DIAGNOSIS — M5441 Lumbago with sciatica, right side: Secondary | ICD-10-CM | POA: Diagnosis not present

## 2019-11-03 DIAGNOSIS — E559 Vitamin D deficiency, unspecified: Secondary | ICD-10-CM

## 2019-11-03 DIAGNOSIS — Q2112 Patent foramen ovale: Secondary | ICD-10-CM

## 2019-11-03 DIAGNOSIS — R945 Abnormal results of liver function studies: Secondary | ICD-10-CM

## 2019-11-03 DIAGNOSIS — M25562 Pain in left knee: Secondary | ICD-10-CM

## 2019-11-03 DIAGNOSIS — M5030 Other cervical disc degeneration, unspecified cervical region: Secondary | ICD-10-CM

## 2019-11-03 DIAGNOSIS — E119 Type 2 diabetes mellitus without complications: Secondary | ICD-10-CM | POA: Diagnosis not present

## 2019-11-03 DIAGNOSIS — M5442 Lumbago with sciatica, left side: Secondary | ICD-10-CM

## 2019-11-03 DIAGNOSIS — M25552 Pain in left hip: Secondary | ICD-10-CM

## 2019-11-03 DIAGNOSIS — G8929 Other chronic pain: Secondary | ICD-10-CM

## 2019-11-03 DIAGNOSIS — R7989 Other specified abnormal findings of blood chemistry: Secondary | ICD-10-CM

## 2019-11-03 DIAGNOSIS — M25551 Pain in right hip: Secondary | ICD-10-CM | POA: Diagnosis not present

## 2019-11-03 DIAGNOSIS — Z8669 Personal history of other diseases of the nervous system and sense organs: Secondary | ICD-10-CM

## 2019-11-03 DIAGNOSIS — Q211 Atrial septal defect: Secondary | ICD-10-CM

## 2019-11-03 DIAGNOSIS — I10 Essential (primary) hypertension: Secondary | ICD-10-CM | POA: Diagnosis not present

## 2019-11-03 DIAGNOSIS — Z8709 Personal history of other diseases of the respiratory system: Secondary | ICD-10-CM

## 2019-11-03 DIAGNOSIS — M25512 Pain in left shoulder: Secondary | ICD-10-CM

## 2019-11-03 DIAGNOSIS — R195 Other fecal abnormalities: Secondary | ICD-10-CM

## 2019-11-03 MED ORDER — BLOOD GLUCOSE MONITOR KIT: 1.0000 | PACK | Freq: Every day | 0 refills | Status: DC

## 2019-11-03 NOTE — Telephone Encounter (Signed)
Patient is calling and states that the pharmacy sent [place] over a request for a prior authorization for her new meter. Patient would like to speak to nurse.

## 2019-11-03 NOTE — Patient Instructions (Signed)
Please come in for your labs 2 weeks prior to your appointment.

## 2019-11-03 NOTE — Telephone Encounter (Signed)
Left a message requesting a return call to the office. 

## 2019-11-04 NOTE — Telephone Encounter (Signed)
Discussed with pt we sent a prescription for a generic meter, test strips and lancets so the pharmacy can fill with the device her insurance will cover. Understanding voiced per pt.

## 2019-11-04 NOTE — Telephone Encounter (Signed)
Left a message requesting a return call to the office. 

## 2019-11-05 ENCOUNTER — Telehealth: Payer: Self-pay

## 2019-11-05 NOTE — Telephone Encounter (Signed)
Received Medicare CMN form for pt's diabetic testing supplies. Form completed and faxed back.

## 2019-11-05 NOTE — Telephone Encounter (Signed)
Pt lvm that she is still having trouble with Insurance, and needs the nurse to call [phone], to get it straight. And please call her to update

## 2019-11-05 NOTE — Telephone Encounter (Signed)
Called Medicare at [phone] requested the CMN form required for pt's diabetic testing supplies. They stated they would fax form over to our office. Pt made aware.

## 2019-11-08 ENCOUNTER — Other Ambulatory Visit: Payer: Self-pay

## 2019-11-08 DIAGNOSIS — E119 Type 2 diabetes mellitus without complications: Secondary | ICD-10-CM | POA: Diagnosis not present

## 2019-11-08 DIAGNOSIS — F17200 Nicotine dependence, unspecified, uncomplicated: Secondary | ICD-10-CM | POA: Diagnosis not present

## 2019-11-08 DIAGNOSIS — F17210 Nicotine dependence, cigarettes, uncomplicated: Secondary | ICD-10-CM | POA: Diagnosis not present

## 2019-11-08 DIAGNOSIS — I10 Essential (primary) hypertension: Secondary | ICD-10-CM | POA: Diagnosis not present

## 2019-11-08 MED ORDER — ACCU-CHEK GUIDE VI STRP: 1.0000 | ORAL_STRIP | Freq: Every day | 1 refills | Status: DC

## 2019-11-08 NOTE — Telephone Encounter (Signed)
Pt requesting a call back. 

## 2019-11-08 NOTE — Telephone Encounter (Signed)
Called pt, she stated [facility] had not received the CMN form and also needed a prescription for glucose test strips to test once a day. CMN form refaxed this morning and rx for glucose testing strips to test BG once daily sent.

## 2019-11-09 NOTE — Telephone Encounter (Signed)
Talked with pharmacist at [facility], made them aware we have faxed the CMN form Friday, yesterday and today. She stated it can take several days for the information to be uploaded into their system and for the pt to check back with them on Thursday. Pt has been made aware and voiced understanding.

## 2019-11-09 NOTE — Telephone Encounter (Signed)
Patient is requesting a call back because she still has not been able to get her meter.

## 2019-11-12 NOTE — Telephone Encounter (Signed)
Pt is calling back in regards to this and is requesting a call back from [place].

## 2019-11-12 NOTE — Telephone Encounter (Signed)
Left a message requesting a return call to the office. 

## 2019-11-15 NOTE — Telephone Encounter (Signed)
Pt returning your call

## 2019-11-15 NOTE — Telephone Encounter (Signed)
Pt states she continues not to be able to get her glucose meter. Called [facility] on [address] and while on the phone they tried to send for approval again for pt's meter and it did go through with approval. Called pt back and made her aware, understanding voiced.

## 2019-11-16 DIAGNOSIS — E119 Type 2 diabetes mellitus without complications: Secondary | ICD-10-CM | POA: Diagnosis not present

## 2019-11-16 DIAGNOSIS — L259 Unspecified contact dermatitis, unspecified cause: Secondary | ICD-10-CM | POA: Diagnosis not present

## 2019-11-16 DIAGNOSIS — I10 Essential (primary) hypertension: Secondary | ICD-10-CM | POA: Diagnosis not present

## 2019-11-26 ENCOUNTER — Ambulatory Visit (INDEPENDENT_AMBULATORY_CARE_PROVIDER_SITE_OTHER): Payer: Medicare Other | Admitting: Gastroenterology

## 2019-11-26 ENCOUNTER — Encounter: Payer: Self-pay | Admitting: Internal Medicine

## 2019-11-26 ENCOUNTER — Encounter: Payer: Self-pay | Admitting: Gastroenterology

## 2019-11-26 ENCOUNTER — Other Ambulatory Visit: Payer: Self-pay

## 2019-11-26 VITALS — BP 136/77 | HR 86 | Temp 97.4°F | Ht 63.0 in | Wt 243.4 lb

## 2019-11-26 DIAGNOSIS — R1012 Left upper quadrant pain: Secondary | ICD-10-CM

## 2019-11-26 DIAGNOSIS — K760 Fatty (change of) liver, not elsewhere classified: Secondary | ICD-10-CM | POA: Diagnosis not present

## 2019-11-26 DIAGNOSIS — R7989 Other specified abnormal findings of blood chemistry: Secondary | ICD-10-CM

## 2019-11-26 DIAGNOSIS — K219 Gastro-esophageal reflux disease without esophagitis: Secondary | ICD-10-CM

## 2019-11-26 DIAGNOSIS — R945 Abnormal results of liver function studies: Secondary | ICD-10-CM

## 2019-11-26 NOTE — Patient Instructions (Signed)
1. Please have labs and ultrasound done. We will contact you with results as available.  2. Continue Aciphex 20mg  daily before a meal. Discuss concerns about interaction with plavix with your cardiologist. Only other PPIs that don't have an interaction with plavix listed are pantoprazole, lansoprazole, or Dexilant. We can try one if you decide.  3. Return to the office in six months.    Fatty Liver Disease  Fatty liver disease occurs when too much fat has built up in your liver cells. Fatty liver disease is also called hepatic steatosis or steatohepatitis. The liver removes harmful substances from your bloodstream and produces fluids that your body needs. It also helps your body use and store energy from the food you eat. In many cases, fatty liver disease does not cause symptoms or problems. It is often diagnosed when tests are being done for other reasons. However, over time, fatty liver can cause inflammation that may lead to more serious liver problems, such as scarring of the liver (cirrhosis) and liver failure. Fatty liver is associated with insulin resistance, increased body fat, high blood pressure (hypertension), and high cholesterol. These are features of metabolic syndrome and increase your risk for stroke, diabetes, and heart disease. What are the causes? This condition may be caused by:  Drinking too much alcohol.  Poor nutrition.  Obesity.  Cushing's syndrome.  Diabetes.  High cholesterol.  Certain drugs.  Poisons.  Some viral infections.  Pregnancy. What increases the risk? You are more likely to develop this condition if you:  Abuse alcohol.  Are overweight.  Have diabetes.  Have hepatitis.  Have a high triglyceride level.  Are pregnant. What are the signs or symptoms? Fatty liver disease often does not cause symptoms. If symptoms do develop, they can include:  Fatigue.  Weakness.  Weight loss.  Confusion.  Abdominal pain.  Nausea and  vomiting.  Yellowing of your skin and the white parts of your eyes (jaundice).  Itchy skin. How is this diagnosed? This condition may be diagnosed by:  A physical exam and medical history.  Blood tests.  Imaging tests, such as an ultrasound, CT scan, or MRI.  A liver biopsy. A small sample of liver tissue is removed using a needle. The sample is then looked at under a microscope. How is this treated? Fatty liver disease is often caused by other health conditions. Treatment for fatty liver may involve medicines and lifestyle changes to manage conditions such as:  Alcoholism.  High cholesterol.  Diabetes.  Being overweight or obese. Follow these instructions at home:   Do not drink alcohol. If you have trouble quitting, ask your health care provider how to safely quit with the help of medicine or a supervised program. This is important to keep your condition from getting worse.  Eat a healthy diet as told by your health care provider. Ask your health care provider about working with a diet and nutrition specialist (dietitian) to develop an eating plan.  Exercise regularly. This can help you lose weight and control your cholesterol and diabetes. Talk to your health care provider about an exercise plan and which activities are best for you.  Take over-the-counter and prescription medicines only as told by your health care provider.  Keep all follow-up visits as told by your health care provider. This is important. Contact a health care provider if: You have trouble controlling your:  Blood sugar. This is especially important if you have diabetes.  Cholesterol.  Drinking of alcohol. Get help right  away if:  You have abdominal pain.  You have jaundice.  You have nausea and vomiting.  You vomit blood or material that looks like coffee grounds.  You have stools that are black, tar-like, or bloody. Summary  Fatty liver disease develops when too much fat builds up in  the cells of your liver.  Fatty liver disease often causes no symptoms or problems. However, over time, fatty liver can cause inflammation that may lead to more serious liver problems, such as scarring of the liver (cirrhosis).  You are more likely to develop this condition if you abuse alcohol, are pregnant, are overweight, have diabetes, have hepatitis, or have high triglyceride levels.  Contact your health care provider if you have trouble controlling your weight, blood sugar, cholesterol, or drinking of alcohol. This information is not intended to replace advice given to you by your health care provider. Make sure you discuss any questions you have with your health care provider. Document Revised: 02/28/2017 Document Reviewed: 12/25/2016 Elsevier Patient Education  2020 [place].  Nonalcoholic Fatty Liver Disease Diet, Adult Nonalcoholic fatty liver disease is a condition that causes fat to build up in and around the liver. The disease makes it harder for the liver to work the way that it should. Following a healthy diet can help to keep nonalcoholic fatty liver disease under control. It can also help to prevent or improve conditions that are associated with the disease, such as heart disease, diabetes, high blood pressure, and abnormal cholesterol levels. Along with regular exercise, this diet:  Promotes weight loss.  Helps to control blood sugar levels.  Helps to improve the way that the body uses insulin. What are tips for following this plan? Reading food labels Always check food labels for:  The amount of saturated fat in a food. You should limit your intake of saturated fat. Saturated fat is found in foods that come from animals, including meat and dairy products such as butter, cheese, and whole milk.  The amount of fiber in a food. You should choose high-fiber foods such as fruits, vegetables, and whole grains. Try to get 25-30 grams (g) of fiber a day.  Cooking  When  cooking, use heart-healthy oils that are high in monounsaturated fats. These include olive oil, canola oil, and avocado oil.  Limit frying or deep-frying foods. Cook foods using healthy methods such as baking, boiling, steaming, and grilling instead. Meal planning  You may want to keep track of how many calories you take in. Eating the right amount of calories will help you achieve a healthy weight. Meeting with a registered dietitian can help you get started.  Limit how often you eat takeout and fast food. These foods are usually very high in fat, salt, and sugar.  Use the glycemic index (GI) to plan your meals. The index tells you how quickly a food will raise your blood sugar. Choose low-GI foods (GI less than 55). These foods take a longer time to raise blood sugar. A registered dietitian can help you identify foods lower on the GI scale. Lifestyle  You may want to follow a Mediterranean diet. This diet includes a lot of vegetables, lean meats or fish, whole grains, fruits, and healthy oils and fats. What foods can I eat?  Fruits Bananas. Apples. Oranges. Grapes. Papaya. Mango. Pomegranate. Kiwi. Grapefruit. Cherries. Vegetables Lettuce. Spinach. Peas. Beets. Cauliflower. Cabbage. Broccoli. Carrots. Tomatoes. Squash. Eggplant. Herbs. Peppers. Onions. Cucumbers. Brussels sprouts. Yams and sweet potatoes. Beans. Lentils. Grains Whole wheat  or whole-grain foods, including breads, crackers, cereals, and pasta. Stone-ground whole wheat. Unsweetened oatmeal. Bulgur. Barley. Quinoa. Brown or wild rice. Corn or whole wheat flour tortillas. Meats and other proteins Lean meats. Poultry. Tofu. Seafood and shellfish. Dairy Low-fat or fat-free dairy products, such as yogurt, cottage cheese, or cheese. Beverages Water. Sugar-free drinks. Tea. Coffee. Low-fat or skim milk. Milk alternatives, such as soy or almond milk. Real fruit juice. Fats and oils Avocado. Canola or olive oil. Nuts and nut  butters. Seeds. Seasonings and condiments Mustard. Relish. Low-fat, low-sugar ketchup and barbecue sauce. Low-fat or fat-free mayonnaise. Sweets and desserts Sugar-free sweets. The items listed above may not be a complete list of foods and beverages you can eat. Contact a dietitian for more information. What foods should I limit or avoid? Meats and other proteins Limit red meat to 1-2 times a week. Dairy [employer]. Fats and oils Palm oil and coconut oil. Fried foods. Other foods Processed foods. Foods that contain a lot of salt or sodium. Sweets and desserts Sweets that contain sugar. Beverages Sweetened drinks, such as sweet tea, milkshakes, iced sweet drinks, and sodas. Alcohol. The items listed above may not be a complete list of foods and beverages you should avoid. Contact a dietitian for more information. Where to find more information The [employer] of Diabetes and Digestive and Kidney Diseases: [employer] Summary  Nonalcoholic fatty liver disease is a condition that causes fat to build up in and around the liver.  Following a healthy diet can help to keep nonalcoholic fatty liver disease under control. Your diet should be rich in fruits, vegetables, whole grains, and lean proteins.  Limit your intake of saturated fat. Saturated fat is found in foods that come from animals, including meat and dairy products such as butter, cheese, and whole milk.  This diet promotes weight loss, helps to control blood sugar levels, and helps to improve the way that the body uses insulin. This information is not intended to replace advice given to you by your health care provider. Make sure you discuss any questions you have with your health care provider. Document Revised: 07/10/2018 Document Reviewed: 04/09/2018 Elsevier Patient Education  [place].

## 2019-11-26 NOTE — Progress Notes (Signed)
Primary Care Physician: Rosita Fire, MD  Primary Gastroenterologist:  Garfield Cornea, MD   Chief Complaint  Patient presents with  . Follow-up    bloating and pressure in left side under rib cage    HPI: Wendy Kramer is a 59 y.o. female here alone.  Last seen in December 2020.  She has a history of GERD, fatty liver.  Prior CT imaging showed hepatic steatosis with mildly nodular contour of the liver surface, borderline splenomegaly, findings concerning for cirrhosis with possible developing portal hypertension.  She has a history of abnormal LFTs.  Labs in December 2020 showed meld sodium of 6, transaminases mildly elevated, no evidence of iron overload, recommended vaccination for hepatitis A and B based on lack of immunity demonstrated normal labs, hepatitis C antibody negative, AMA negative, immunoglobulins normal.  She was referred to rheumatology for positive ANA.  Colonoscopy December 2019, diverticulosis in the sigmoid colon and descending colon.  3 polyps removed, 2 hyperplastic and one sessile serrated polyp.  Repeat colonoscopy in 3 years.  EGD December 2019 with erosive reflux esophagitis, small hiatal hernia, erosive gastropathy.  Pathology with reactive gastropathy.  No H. Pylori.  Down maybe 5 pounds. A1C 9.8 to 7.4 since adding back metformin. Metformin increased to 1024m bid, seems to be tolerating. BM bristol 5-6 (her normal before metformin). Occasional blow out but overall stools doing ok. No melena, brbpr. Heartburn doing great on Aciphex. No dysphagia. No vomiting. Bloating in LUQ area but no pain. Feels gas moving in that area.   Current Outpatient Medications  Medication Sig Dispense Refill  . acetaminophen (TYLENOL) 500 MG tablet Take 500 mg by mouth as needed.     .Marland Kitchenalbuterol (PROVENTIL HFA;VENTOLIN HFA) 108 (90 Base) MCG/ACT inhaler Inhale 1-2 puffs into the lungs as needed for wheezing or shortness of breath.     . ALPRAZolam (XANAX) 0.5 MG tablet Take  0.25 mg by mouth daily as needed for anxiety.     . blood glucose meter kit and supplies KIT 1 each by Other route daily. Dispense based on patient and insurance preference. Use once a day as directed. (FOR ICD-9 250.00, 250.01). 1 each 0  . Blood Glucose Monitoring Suppl (ACCU-CHEK GUIDE ME) w/Device KIT 1 Piece by Does not apply route as directed. 1 kit 0  . budesonide-formoterol (SYMBICORT) 80-4.5 MCG/ACT inhaler Inhale 1 puff into the lungs 2 (two) times daily.     . Cholecalciferol (VITAMIN D3) 50 MCG (2000 UT) TABS Take 2,000 Units by mouth daily.     . clopidogrel (PLAVIX) 75 MG tablet Take 1 tablet (75 mg total) by mouth daily. (Patient taking differently: Take 75 mg by mouth every other day. ) 30 tablet 0  . cyclobenzaprine (FLEXERIL) 5 MG tablet Take 5 mg by mouth daily as needed for muscle spasms.   0  . fluticasone (FLONASE) 50 MCG/ACT nasal spray Place 1 spray into both nostrils daily as needed for allergies or rhinitis.     .Marland Kitchenglucose blood (ACCU-CHEK GUIDE) test strip 1 each by Other route daily. Use 1 x a day to monitor BG 100 each 1  . HYDROcodone-acetaminophen (NORCO/VICODIN) 5-325 MG tablet Take 1 tablet by mouth daily as needed.    .Marland Kitchenlisinopril (ZESTRIL) 20 MG tablet Take 20 mg by mouth daily.    . metFORMIN (GLUCOPHAGE) 1000 MG tablet Take 1,000 mg by mouth 2 (two) times daily.    . Multiple Vitamin (MULTIVITAMIN ADULT PO) Take 1 tablet by  mouth daily.    . RABEprazole (ACIPHEX) 20 MG tablet TAKE 1 TABLET BY MOUTH EVERY DAY 90 tablet 3  . traMADol (ULTRAM) 50 MG tablet Take 50 mg by mouth every 6 (six) hours as needed for moderate pain.   3   No current facility-administered medications for this visit.    Allergies as of 11/26/2019 - Review Complete 11/26/2019  Allergen Reaction Noted  . Asa [aspirin] Anaphylaxis 09/24/2016  . Augmentin [amoxicillin-pot clavulanate] Other (See Comments) 09/24/2016  . Levaquin [levofloxacin in d5w] Itching and Other (See Comments) 09/24/2016   . Lovenox [enoxaparin sodium] Other (See Comments) 09/24/2016    ROS:  General: Negative for anorexia, weight loss, fever, chills, fatigue, weakness. ENT: Negative for hoarseness, difficulty swallowing , nasal congestion. CV: Negative for chest pain, angina, palpitations, dyspnea on exertion, peripheral edema.  Respiratory: Negative for dyspnea at rest, dyspnea on exertion, cough, sputum, wheezing.  GI: See history of present illness. GU:  Negative for dysuria, hematuria, urinary incontinence, urinary frequency, nocturnal urination.  Endo: Negative for unusual weight change.    Physical Examination:   BP 136/77   Pulse 86   Temp (!) 97.4 F (36.3 C) (Oral)   Ht '5\' 3"'  (1.6 m)   Wt 243 lb 6.4 oz (110.4 kg)   LMP 09/06/2016   BMI 43.12 kg/m   General: Well-nourished, well-developed in no acute distress.  Eyes: No icterus. Mouth: masked Lungs: Clear to auscultation bilaterally.  Heart: Regular rate and rhythm, no murmurs rubs or gallops.  Abdomen: Bowel sounds are normal, nontender, nondistended, no hepatosplenomegaly or masses, no abdominal bruits or hernia , no rebound or guarding.  Exam limited by body habitus. Extremities: No lower extremity edema. No clubbing or deformities. Neuro: Alert and oriented x 4   Skin: Warm and dry, no jaundice.   Psych: Alert and cooperative, normal mood and affect.  Labs:  Lab Results  Component Value Date   CREATININE 0.1 (A) 07/23/2019   BUN 12 07/23/2019   NA 138 03/30/2019   K 4.4 03/30/2019   CL 101 03/30/2019   CO2 28 03/30/2019   Lab Results  Component Value Date   ALT 39 (H) 03/30/2019   AST 41 (H) 03/30/2019   ALKPHOS 82 03/10/2019   BILITOT 0.4 03/30/2019   Lab Results  Component Value Date   WBC 10.0 03/10/2019   HGB 16.7 (H) 03/10/2019   HCT 49.0 (H) 03/10/2019   MCV 95.1 03/10/2019   PLT 221 03/10/2019   Lab Results  Component Value Date   INR 0.9 03/30/2019    Imaging Studies: No results  found.   Impression/Plan:  59 y/o female with history of GERD, fatty liver possible cirrhosis, abnormal LFTs presenting for follow up.   GERD: Aciphex provides 99% control of her symptoms. Does better than any other PPI that she has tried. She is concerned about potential interaction with Plavix, decreasing efficacy of Plavix.  We discussed numerous PPIs have potential concerns for decreasing efficacy of Plavix, the only once that it is not labeled as an issue are pantoprazole, lansoprazole, Dexilant.  She is hesitant to switching medication.  I have asked her to discuss further with her cardiologist to see if they have any concerns about her continuing AcipHex.  She agreed.  Fatty liver with possible cirrhosis based on imaging as outlined above.  Suspect due to [place].  Due for labs and follow-up imaging at this time.  She complains of left upper quadrant fullness but no pain.  Describes  possible gas trapping in this area.  Concern for possible mild splenomegaly on prior imaging, cannot rule out increased size of her spleen contributing to her symptoms.  We will evaluated upcoming ultrasound initially.  Update labs.  Return to the office in 6 months.

## 2019-12-02 ENCOUNTER — Ambulatory Visit (HOSPITAL_COMMUNITY): Payer: Medicare Other

## 2019-12-09 ENCOUNTER — Ambulatory Visit (HOSPITAL_COMMUNITY)
Admission: RE | Admit: 2019-12-09 | Discharge: 2019-12-09 | Disposition: A | Discharge status: Home / Self Care | Payer: Medicare Other | Source: Ambulatory Visit | Attending: Gastroenterology | Admitting: Gastroenterology

## 2019-12-09 ENCOUNTER — Other Ambulatory Visit: Payer: Self-pay

## 2019-12-09 DIAGNOSIS — K760 Fatty (change of) liver, not elsewhere classified: Secondary | ICD-10-CM | POA: Diagnosis not present

## 2019-12-09 DIAGNOSIS — R7989 Other specified abnormal findings of blood chemistry: Secondary | ICD-10-CM

## 2019-12-09 DIAGNOSIS — R945 Abnormal results of liver function studies: Secondary | ICD-10-CM | POA: Insufficient documentation

## 2019-12-09 DIAGNOSIS — R1012 Left upper quadrant pain: Secondary | ICD-10-CM | POA: Insufficient documentation

## 2019-12-09 DIAGNOSIS — K219 Gastro-esophageal reflux disease without esophagitis: Secondary | ICD-10-CM | POA: Insufficient documentation

## 2019-12-09 DIAGNOSIS — Z9049 Acquired absence of other specified parts of digestive tract: Secondary | ICD-10-CM | POA: Diagnosis not present

## 2019-12-10 LAB — CBC WITH DIFFERENTIAL/PLATELET
Absolute Monocytes: 571 cells/uL (ref 200–950)
Basophils Absolute: 42 cells/uL (ref 0–200)
Basophils Relative: 0.5 %
Eosinophils Absolute: 521 cells/uL — ABNORMAL HIGH (ref 15–500)
Eosinophils Relative: 6.2 %
HCT: 43.4 % (ref 35.0–45.0)
Hemoglobin: 14.4 g/dL (ref 11.7–15.5)
Lymphs Abs: 1907 cells/uL (ref 850–3900)
MCH: 30.1 pg (ref 27.0–33.0)
MCHC: 33.2 g/dL (ref 32.0–36.0)
MCV: 90.8 fL (ref 80.0–100.0)
MPV: 11.3 fL (ref 7.5–12.5)
Monocytes Relative: 6.8 %
Neutro Abs: 5359 cells/uL (ref 1500–7800)
Neutrophils Relative %: 63.8 %
Platelets: 204 10*3/uL (ref 140–400)
RBC: 4.78 10*6/uL (ref 3.80–5.10)
RDW: 13 % (ref 11.0–15.0)
Total Lymphocyte: 22.7 %
WBC: 8.4 10*3/uL (ref 3.8–10.8)

## 2019-12-10 LAB — COMPREHENSIVE METABOLIC PANEL
AG Ratio: 1.7 (calc) (ref 1.0–2.5)
ALT: 33 U/L — ABNORMAL HIGH (ref 6–29)
AST: 55 U/L — ABNORMAL HIGH (ref 10–35)
Albumin: 4 g/dL (ref 3.6–5.1)
Alkaline phosphatase (APISO): 78 U/L (ref 37–153)
BUN: 11 mg/dL (ref 7–25)
CO2: 30 mmol/L (ref 20–32)
Calcium: 9.1 mg/dL (ref 8.6–10.4)
Chloride: 103 mmol/L (ref 98–110)
Creat: 0.82 mg/dL (ref 0.50–0.99)
Globulin: 2.4 g/dL (calc) (ref 1.9–3.7)
Glucose, Bld: 149 mg/dL — ABNORMAL HIGH (ref 65–139)
Potassium: 4.4 mmol/L (ref 3.5–5.3)
Sodium: 141 mmol/L (ref 135–146)
Total Bilirubin: 0.7 mg/dL (ref 0.2–1.2)
Total Protein: 6.4 g/dL (ref 6.1–8.1)

## 2019-12-10 LAB — PROTIME-INR
INR: 1
Prothrombin Time: 10.3 s (ref 9.0–11.5)

## 2019-12-17 ENCOUNTER — Telehealth: Payer: Self-pay | Admitting: Internal Medicine

## 2019-12-17 DIAGNOSIS — I10 Essential (primary) hypertension: Secondary | ICD-10-CM | POA: Diagnosis not present

## 2019-12-17 DIAGNOSIS — E119 Type 2 diabetes mellitus without complications: Secondary | ICD-10-CM | POA: Diagnosis not present

## 2019-12-17 NOTE — Telephone Encounter (Signed)
Pt was calling to see if her results were back. [phone]

## 2019-12-17 NOTE — Telephone Encounter (Signed)
See u/s result note. 

## 2019-12-17 NOTE — Telephone Encounter (Signed)
Pt is inquiring about lab results and imaging results.

## 2019-12-20 NOTE — Telephone Encounter (Signed)
Lmom for pt to call back. 

## 2019-12-20 NOTE — Telephone Encounter (Signed)
Pt called back.  Informed her that Neil Crouch, PA recommends she try a probiotic since she feels she has gas in upper abd.  Pt was informed that good choices are as follows:  Align one daily for 4 weeks. [employer] as per package instructions for 4 weeks. She was made aware to pick one or the other.  Pt voiced understanding and will call us back if any further problems.

## 2019-12-20 NOTE — Telephone Encounter (Signed)
Would recommend she try a probiotic since she feels she has gas in upper abd. We discussed symptoms at time of ov.   Good choices, pick one or the other.  Align one daily for 4 weeks. [employer] as per package instructions for 4 weeks.

## 2019-12-20 NOTE — Telephone Encounter (Addendum)
Noted.  Called pt and made her aware of u/s results and recommendations.  Pt wants to know if we can recommend something to help her with bloating under her rib cage after eating.  She said that her gb was removed 15 years ago.  She said that Gas X is not working for her.

## 2019-12-21 DIAGNOSIS — M542 Cervicalgia: Secondary | ICD-10-CM | POA: Diagnosis not present

## 2019-12-21 DIAGNOSIS — I10 Essential (primary) hypertension: Secondary | ICD-10-CM | POA: Diagnosis not present

## 2019-12-21 DIAGNOSIS — E612 Magnesium deficiency: Secondary | ICD-10-CM | POA: Diagnosis not present

## 2019-12-21 DIAGNOSIS — F419 Anxiety disorder, unspecified: Secondary | ICD-10-CM | POA: Diagnosis not present

## 2019-12-21 DIAGNOSIS — R252 Cramp and spasm: Secondary | ICD-10-CM | POA: Diagnosis not present

## 2019-12-21 DIAGNOSIS — I679 Cerebrovascular disease, unspecified: Secondary | ICD-10-CM | POA: Diagnosis not present

## 2019-12-21 DIAGNOSIS — I6990 Unspecified sequelae of unspecified cerebrovascular disease: Secondary | ICD-10-CM | POA: Diagnosis not present

## 2019-12-21 DIAGNOSIS — M79601 Pain in right arm: Secondary | ICD-10-CM | POA: Diagnosis not present

## 2019-12-21 DIAGNOSIS — M545 Low back pain: Secondary | ICD-10-CM | POA: Diagnosis not present

## 2020-01-16 DIAGNOSIS — E119 Type 2 diabetes mellitus without complications: Secondary | ICD-10-CM | POA: Diagnosis not present

## 2020-01-16 DIAGNOSIS — I10 Essential (primary) hypertension: Secondary | ICD-10-CM | POA: Diagnosis not present

## 2020-01-17 ENCOUNTER — Other Ambulatory Visit: Payer: Self-pay

## 2020-01-17 DIAGNOSIS — E1165 Type 2 diabetes mellitus with hyperglycemia: Secondary | ICD-10-CM

## 2020-01-17 MED ORDER — ACCU-CHEK GUIDE VI STRP: 1.0000 | ORAL_STRIP | Freq: Every day | 1 refills | Status: DC

## 2020-01-19 ENCOUNTER — Telehealth: Payer: Self-pay | Admitting: Rheumatology

## 2020-01-19 DIAGNOSIS — R768 Other specified abnormal immunological findings in serum: Secondary | ICD-10-CM

## 2020-01-19 NOTE — Telephone Encounter (Signed)
Lab Orders released.  

## 2020-01-19 NOTE — Telephone Encounter (Signed)
Patient going to [facility] in [place] for labs. Please release orders.  

## 2020-01-20 NOTE — Progress Notes (Deleted)
Office Visit Note  Patient: Wendy Kramer             Date of Birth: 1960-10-07           MRN: [id number]             PCP: Rosita Fire, MD Referring: Rosita Fire, MD Visit Date: 02/03/2020 Occupation: @GUAROCC @  Subjective:  No chief complaint on file.   History of Present Illness: Wendy Kramer is a 59 y.o. female ***   Activities of Daily Living:  Patient reports morning stiffness for *** {minute/hour:19697}.   Patient {ACTIONS;DENIES/REPORTS:21021675::"Denies"} nocturnal pain.  Difficulty dressing/grooming: {ACTIONS;DENIES/REPORTS:21021675::"Denies"} Difficulty climbing stairs: {ACTIONS;DENIES/REPORTS:21021675::"Denies"} Difficulty getting out of chair: {ACTIONS;DENIES/REPORTS:21021675::"Denies"} Difficulty using hands for taps, buttons, cutlery, and/or writing: {ACTIONS;DENIES/REPORTS:21021675::"Denies"}  No Rheumatology ROS completed.   PMFS History:  Patient Active Problem List   Diagnosis Date Noted  . LUQ pain 11/26/2019  . Encounter for gynecological examination with Papanicolaou smear of cervix 10/06/2019  . Routine cervical smear 10/06/2019  . Encounter for screening fecal occult blood testing 10/06/2019  . Uncontrolled type 2 diabetes mellitus with hyperglycemia ([place]) 09/27/2019  . Mixed hyperlipidemia 09/27/2019  . Morbid obesity ([place]) 09/27/2019  . Abnormal LFTs 03/16/2019  . Diabetes mellitus without complication ([place])   . Positive colorectal cancer screening using Cologuard test 12/24/2017  . Abdominal pain, epigastric 12/24/2017  . GERD (gastroesophageal reflux disease) 12/24/2017  . Fatty liver 12/24/2017    Past Medical History:  Diagnosis Date  . Arthritis   . Cardiac murmur   . Cervical spine disease   . Diabetes mellitus without complication (HCC)    diet controlled  . Exogenous hyperlipidemia   . History of kidney stones   . HTN (hypertension)   . Morbid obesity due to excess calories ([place])   . Other chronic pain   . Panic disorder    . Patent foramen ovale    TEE 2018  . Prediabetes   . Sleep apnea   . Stroke [facility])    "mini-stroke" no deficits from this.  Marland Kitchen Unspecified asthma with (acute) exacerbation   . Vitamin D deficiency     Family History  Problem Relation Age of Onset  . Breast cancer Maternal Aunt   . Colon cancer Other        maternal great grandfather  . Liver disease Brother        Fatty liver  . HIV Brother   . Hypertension Mother   . Hyperlipidemia Mother   . Hypertension Father   . Thyroid disease Sister   . Pancreatic cancer Sister   . Healthy Daughter   . Healthy Son   . Healthy Son   . Heart disease Son    Past Surgical History:  Procedure Laterality Date  . BIOPSY  03/05/2018   Procedure: BIOPSY;  Surgeon: Daneil Dolin, MD;  Location: [facility];  Service: Endoscopy;;  gastric  . CHOLECYSTECTOMY    . COLONOSCOPY WITH PROPOFOL N/A 03/05/2018   Dr. Gala Romney: Diverticulosis in the sigmoid and descending colon.  3 colon polyps removed, 2 hyperplastic and one sessile serrated polyp.  Next colonoscopy 3 years.  . ESOPHAGOGASTRODUODENOSCOPY (EGD) WITH PROPOFOL N/A 03/05/2018   Dr. Gala Romney: Erosive reflux esophagitis, small hiatal hernia, erosive gastropathy with reactive gastropathy on biopsy.  No H. pylori.  Marland Kitchen HERNIA REPAIR     umbilical  . POLYPECTOMY  03/05/2018   Procedure: POLYPECTOMY;  Surgeon: Daneil Dolin, MD;  Location: [facility];  Service: Endoscopy;;  colon  .  SHOULDER ARTHROSCOPY Right   . TEE WITHOUT CARDIOVERSION  2018   patent foramen ovale  . TONSILLECTOMY AND ADENOIDECTOMY    . TUBAL LIGATION     Social History   Social History Narrative  . Not on file    There is no immunization history on file for this patient.   Objective: Vital Signs: LMP 09/06/2016    Physical Exam   Musculoskeletal Exam: ***  CDAI Exam: CDAI Score: -- Patient Global: --; Provider Global: -- Swollen: --; Tender: -- Joint Exam 02/03/2020   No joint exam has been documented  for this visit   There is currently no information documented on the homunculus. Go to the Rheumatology activity and complete the homunculus joint exam.  Investigation: No additional findings.  Imaging: No results found.  Recent Labs: Lab Results  Component Value Date   WBC 8.4 12/09/2019   HGB 14.4 12/09/2019   PLT 204 12/09/2019   NA 141 12/09/2019   K 4.4 12/09/2019   CL 103 12/09/2019   CO2 30 12/09/2019   GLUCOSE 149 (H) 12/09/2019   BUN 11 12/09/2019   CREATININE 0.82 12/09/2019   BILITOT 0.7 12/09/2019   ALKPHOS 82 03/10/2019   AST 55 (H) 12/09/2019   ALT 33 (H) 12/09/2019   PROT 6.4 12/09/2019   ALBUMIN 3.7 03/10/2019   CALCIUM 9.1 12/09/2019   GFRAA 96 07/22/2019    Speciality Comments: No specialty comments available.  Procedures:  No procedures performed Allergies: Asa [aspirin], Augmentin [amoxicillin-pot clavulanate], Levaquin [levofloxacin in d5w], and Lovenox [enoxaparin sodium]   Assessment / Plan:     Visit Diagnoses: No diagnosis found.  Orders: No orders of the defined types were placed in this encounter.  No orders of the defined types were placed in this encounter.   Face-to-face time spent with patient was *** minutes. Greater than 50% of time was spent in counseling and coordination of care.  Follow-Up Instructions: No follow-ups on file.   Earnestine Mealing, CMA  Note - This record has been created using Editor, commissioning.  Chart creation errors have been sought, but may not always  have been located. Such creation errors do not reflect on  the standard of medical care.

## 2020-01-21 ENCOUNTER — Telehealth: Payer: Self-pay | Admitting: Rheumatology

## 2020-01-21 DIAGNOSIS — R768 Other specified abnormal immunological findings in serum: Secondary | ICD-10-CM | POA: Diagnosis not present

## 2020-01-21 DIAGNOSIS — M25552 Pain in left hip: Secondary | ICD-10-CM | POA: Diagnosis not present

## 2020-01-21 DIAGNOSIS — M25561 Pain in right knee: Secondary | ICD-10-CM | POA: Diagnosis not present

## 2020-01-21 DIAGNOSIS — M25562 Pain in left knee: Secondary | ICD-10-CM | POA: Diagnosis not present

## 2020-01-21 DIAGNOSIS — M25551 Pain in right hip: Secondary | ICD-10-CM | POA: Diagnosis not present

## 2020-01-21 DIAGNOSIS — G8929 Other chronic pain: Secondary | ICD-10-CM | POA: Diagnosis not present

## 2020-01-21 NOTE — Telephone Encounter (Signed)
Lab Orders faxed

## 2020-01-21 NOTE — Telephone Encounter (Signed)
[facility] in [place] is having computer issues. Please fax lab orders to fax# [phone]. Patient there now.

## 2020-01-26 LAB — CARDIOLIPIN ANTIBODIES, IGG, IGM, IGA
Anticardiolipin IgA: 46.1 [APL'U]/mL — ABNORMAL HIGH
Anticardiolipin IgG: <2 [GPL'U]/mL
Anticardiolipin IgM: 3 [MPL'U]/mL

## 2020-01-26 LAB — RFLX HEXAGONAL PHASE CONFIRM: Hexagonal Phase Conf: NEGATIVE

## 2020-01-26 LAB — LUPUS ANTICOAGULANT EVAL W/ REFLEX
PTT-LA Screen: 41 s — ABNORMAL HIGH (ref <=40)
dRVVT: 36 s (ref <=45)

## 2020-01-26 LAB — BETA-2 GLYCOPROTEIN ANTIBODIES
Beta-2 Glyco 1 IgA: 50.1 U/mL — ABNORMAL HIGH
Beta-2 Glyco 1 IgM: 3.2 U/mL
Beta-2 Glyco I IgG: 2 U/mL

## 2020-01-26 NOTE — Telephone Encounter (Signed)
I will discuss results at the follow-up visit.  Anticardiolipin IgA and b2 GP 1 IgM both are positive.

## 2020-02-02 ENCOUNTER — Ambulatory Visit: Payer: Medicare Other | Admitting: Nurse Practitioner

## 2020-02-03 ENCOUNTER — Ambulatory Visit: Payer: Medicare Other | Admitting: Rheumatology

## 2020-02-03 DIAGNOSIS — R195 Other fecal abnormalities: Secondary | ICD-10-CM

## 2020-02-03 DIAGNOSIS — R945 Abnormal results of liver function studies: Secondary | ICD-10-CM

## 2020-02-03 DIAGNOSIS — K760 Fatty (change of) liver, not elsewhere classified: Secondary | ICD-10-CM

## 2020-02-03 DIAGNOSIS — Q211 Atrial septal defect: Secondary | ICD-10-CM

## 2020-02-03 DIAGNOSIS — R768 Other specified abnormal immunological findings in serum: Secondary | ICD-10-CM

## 2020-02-03 DIAGNOSIS — Z8669 Personal history of other diseases of the nervous system and sense organs: Secondary | ICD-10-CM

## 2020-02-03 DIAGNOSIS — E119 Type 2 diabetes mellitus without complications: Secondary | ICD-10-CM

## 2020-02-03 DIAGNOSIS — M5030 Other cervical disc degeneration, unspecified cervical region: Secondary | ICD-10-CM

## 2020-02-03 DIAGNOSIS — I10 Essential (primary) hypertension: Secondary | ICD-10-CM

## 2020-02-03 DIAGNOSIS — E559 Vitamin D deficiency, unspecified: Secondary | ICD-10-CM

## 2020-02-03 DIAGNOSIS — Z8719 Personal history of other diseases of the digestive system: Secondary | ICD-10-CM

## 2020-02-03 DIAGNOSIS — Z8709 Personal history of other diseases of the respiratory system: Secondary | ICD-10-CM

## 2020-02-03 DIAGNOSIS — G8929 Other chronic pain: Secondary | ICD-10-CM

## 2020-02-09 ENCOUNTER — Ambulatory Visit (INDEPENDENT_AMBULATORY_CARE_PROVIDER_SITE_OTHER): Payer: Medicare Other | Admitting: Nurse Practitioner

## 2020-02-09 ENCOUNTER — Other Ambulatory Visit: Payer: Self-pay

## 2020-02-09 ENCOUNTER — Encounter: Payer: Self-pay | Admitting: Nurse Practitioner

## 2020-02-09 VITALS — BP 157/83 | HR 92 | Ht 63.0 in | Wt 245.0 lb

## 2020-02-09 DIAGNOSIS — E782 Mixed hyperlipidemia: Secondary | ICD-10-CM

## 2020-02-09 DIAGNOSIS — E1165 Type 2 diabetes mellitus with hyperglycemia: Secondary | ICD-10-CM | POA: Diagnosis not present

## 2020-02-09 LAB — POCT GLYCOSYLATED HEMOGLOBIN (HGB A1C): Hemoglobin A1C: 8 % — AB (ref 4.0–5.6)

## 2020-02-09 LAB — POCT UA - MICROALBUMIN: Microalbumin Ur, POC: 80 mg/L

## 2020-02-09 NOTE — Progress Notes (Signed)
02/09/2020, 3:57 PM   Endocrinology follow-up note  Subjective:    Patient ID: Wendy Kramer, female    DOB: 10-10-1960.  Wendy Kramer is being seen in follow-up after she was seen in consultation for management of currently uncontrolled symptomatic diabetes requested by  Rosita Fire, MD.   Past Medical History:  Diagnosis Date  . Arthritis   . Cardiac murmur   . Cervical spine disease   . Diabetes mellitus without complication (HCC)    diet controlled  . Exogenous hyperlipidemia   . History of kidney stones   . HTN (hypertension)   . Morbid obesity due to excess calories ([place])   . Other chronic pain   . Panic disorder   . Patent foramen ovale    TEE 2018  . Prediabetes   . Sleep apnea   . Stroke [facility])    "mini-stroke" no deficits from this.  Marland Kitchen Unspecified asthma with (acute) exacerbation   . Vitamin D deficiency     Past Surgical History:  Procedure Laterality Date  . BIOPSY  03/05/2018   Procedure: BIOPSY;  Surgeon: Daneil Dolin, MD;  Location: [facility];  Service: Endoscopy;;  gastric  . CHOLECYSTECTOMY    . COLONOSCOPY WITH PROPOFOL N/A 03/05/2018   Dr. Gala Romney: Diverticulosis in the sigmoid and descending colon.  3 colon polyps removed, 2 hyperplastic and one sessile serrated polyp.  Next colonoscopy 3 years.  . ESOPHAGOGASTRODUODENOSCOPY (EGD) WITH PROPOFOL N/A 03/05/2018   Dr. Gala Romney: Erosive reflux esophagitis, small hiatal hernia, erosive gastropathy with reactive gastropathy on biopsy.  No H. pylori.  Marland Kitchen HERNIA REPAIR     umbilical  . POLYPECTOMY  03/05/2018   Procedure: POLYPECTOMY;  Surgeon: Daneil Dolin, MD;  Location: [facility];  Service: Endoscopy;;  colon  . SHOULDER ARTHROSCOPY Right   . TEE WITHOUT CARDIOVERSION  2018   patent foramen ovale  . TONSILLECTOMY AND ADENOIDECTOMY    . TUBAL LIGATION      Social History   Socioeconomic History  .  Marital status: Legally Separated    Spouse name: Not on file  . Number of children: Not on file  . Years of education: Not on file  . Highest education level: Not on file  Occupational History  . Not on file  Tobacco Use  . Smoking status: Current Every Day Smoker    Packs/day: 0.50    Years: 40.00    Pack years: 20.00    Types: Cigarettes  . Smokeless tobacco: Never Used  Vaping Use  . Vaping Use: Never used  Substance and Sexual Activity  . Alcohol use: No  . Drug use: No  . Sexual activity: Not Currently    Birth control/protection: Post-menopausal, Surgical    Comment: tubal  Other Topics Concern  . Not on file  Social History Narrative  . Not on file   Social Determinants of Health   Financial Resource Strain: High Risk  . Difficulty of Paying Living Expenses: Hard  Food Insecurity: No Food Insecurity  . Worried About Charity fundraiser in the Last Year: Never true  .  Ran Out of Food in the Last Year: Never true  Transportation Needs: No Transportation Needs  . Lack of Transportation (Medical): No  . Lack of Transportation (Non-Medical): No  Physical Activity: Inactive  . Days of Exercise per Week: 0 days  . Minutes of Exercise per Session: 0 min  Stress: Stress Concern Present  . Feeling of Stress : To some extent  Social Connections: Socially Isolated  . Frequency of Communication with Friends and Family: More than three times a week  . Frequency of Social Gatherings with Friends and Family: More than three times a week  . Attends Religious Services: Never  . Active Member of Clubs or Organizations: No  . Attends Archivist Meetings: Never  . Marital Status: Separated    Family History  Problem Relation Age of Onset  . Breast cancer Maternal Aunt   . Colon cancer Other        maternal great grandfather  . Liver disease Brother        Fatty liver  . HIV Brother   . Hypertension Mother   . Hyperlipidemia Mother   . Hypertension Father   .  Thyroid disease Sister   . Pancreatic cancer Sister   . Healthy Daughter   . Healthy Son   . Healthy Son   . Heart disease Son     Outpatient Encounter Medications as of 02/09/2020  Medication Sig  . acetaminophen (TYLENOL) 500 MG tablet Take 500 mg by mouth as needed.   Marland Kitchen albuterol (PROVENTIL HFA;VENTOLIN HFA) 108 (90 Base) MCG/ACT inhaler Inhale 1-2 puffs into the lungs as needed for wheezing or shortness of breath.   . ALPRAZolam (XANAX) 0.5 MG tablet Take 0.25 mg by mouth daily as needed for anxiety.   . blood glucose meter kit and supplies KIT 1 each by Other route daily. Dispense based on patient and insurance preference. Use once a day as directed. (FOR ICD-9 250.00, 250.01).  . Blood Glucose Monitoring Suppl (ACCU-CHEK GUIDE ME) w/Device KIT 1 Piece by Does not apply route as directed.  . budesonide-formoterol (SYMBICORT) 80-4.5 MCG/ACT inhaler Inhale 1 puff into the lungs 2 (two) times daily.   . Cholecalciferol (VITAMIN D3) 50 MCG (2000 UT) TABS Take 2,000 Units by mouth daily.   . clopidogrel (PLAVIX) 75 MG tablet Take 1 tablet (75 mg total) by mouth daily. (Patient taking differently: Take 75 mg by mouth every other day. )  . cyclobenzaprine (FLEXERIL) 5 MG tablet Take 5 mg by mouth daily as needed for muscle spasms.   . fluticasone (FLONASE) 50 MCG/ACT nasal spray Place 1 spray into both nostrils daily as needed for allergies or rhinitis.   Marland Kitchen glucose blood (ACCU-CHEK GUIDE) test strip 1 each by Other route daily. Use 1 x a day to monitor BG  . HYDROcodone-acetaminophen (NORCO/VICODIN) 5-325 MG tablet Take 1 tablet by mouth daily as needed.  Marland Kitchen lisinopril (ZESTRIL) 20 MG tablet Take 20 mg by mouth daily.  . metFORMIN (GLUCOPHAGE) 1000 MG tablet Take 1,000 mg by mouth 2 (two) times daily.  . Multiple Vitamin (MULTIVITAMIN ADULT PO) Take 1 tablet by mouth daily.  . RABEprazole (ACIPHEX) 20 MG tablet TAKE 1 TABLET BY MOUTH EVERY DAY  . traMADol (ULTRAM) 50 MG tablet Take 50 mg by  mouth every 6 (six) hours as needed for moderate pain.   Marland Kitchen triamcinolone (KENALOG) 0.025 % cream Apply topically 2 (two) times daily.   No facility-administered encounter medications on file as of 02/09/2020.  ALLERGIES: Allergies  Allergen Reactions  . Asa [Aspirin] Anaphylaxis  . Augmentin [Amoxicillin-Pot Clavulanate] Other (See Comments)    "Retching and pass out"  . Levaquin [Levofloxacin In D5w] Itching and Other (See Comments)    "body turned red"  . Lovenox [Enoxaparin Sodium] Other (See Comments)    phsyician instructed not to use ever again. "white veins popped out"    VACCINATION STATUS:  There is no immunization history on file for this patient.  Diabetes She presents for her follow-up diabetic visit. She has type 2 diabetes mellitus. Onset time: She was diagnosed at approximate age of 9 years, after several years of prediabetes. Her disease course has been worsening. There are no hypoglycemic associated symptoms. Pertinent negatives for hypoglycemia include no confusion, headaches, pallor or seizures. Pertinent negatives for diabetes include no chest pain, no fatigue, no polydipsia, no polyphagia and no polyuria. There are no hypoglycemic complications. Symptoms are worsening. Diabetic complications include a CVA. Risk factors for coronary artery disease include diabetes mellitus, dyslipidemia, family history, obesity, tobacco exposure, post-menopausal and sedentary lifestyle. Current diabetic treatment includes oral agent (monotherapy). She is compliant with treatment most of the time. Her weight is increasing steadily. She is following a generally unhealthy diet. When asked about meal planning, she reported none. She has not had a previous visit with a dietitian. She never participates in exercise. (She presents today with no meter or logs to review.  She states she is having trouble getting insurance to cover glucometer strips.  Her POCT A1c today is 8%, worsening from last  visit of 7.5%.  She reports she has not been eating well or exercising optimally lately.  She denies any s/s of hypoglycemia.) An ACE inhibitor/angiotensin II receptor blocker is being taken. She does not see a podiatrist.Eye exam is current.  Hyperlipidemia This is a chronic problem. The current episode started more than 1 year ago. Factors aggravating her hyperlipidemia include fatty foods. Pertinent negatives include no chest pain, myalgias or shortness of breath. She is currently on no antihyperlipidemic treatment. Compliance problems include adherence to diet and adherence to exercise.  Risk factors for coronary artery disease include diabetes mellitus, hypertension, obesity, post-menopausal and a sedentary lifestyle.  Hypertension This is a chronic problem. The current episode started more than 1 year ago. The problem has been gradually improving since onset. The problem is controlled. Pertinent negatives include no chest pain, headaches, palpitations or shortness of breath. There are no associated agents to hypertension. Risk factors for coronary artery disease include dyslipidemia, diabetes mellitus, obesity, smoking/tobacco exposure, sedentary lifestyle, post-menopausal state and family history. Past treatments include ACE inhibitors. The current treatment provides moderate improvement. Compliance problems include diet and exercise.  Hypertensive end-organ damage includes CVA. Identifiable causes of hypertension include sleep apnea.    Review of systems  Constitutional: + Minimally fluctuating body weight,  current Body mass index is 43.4 kg/m. , + fatigue, no subjective hyperthermia, no subjective hypothermia Eyes: no blurry vision, no xerophthalmia ENT: no sore throat, no nodules palpated in throat, no dysphagia/odynophagia, no hoarseness Cardiovascular: no chest pain, no shortness of breath, no palpitations, no leg swelling Respiratory: no cough, no shortness of breath Gastrointestinal: no  nausea/vomiting/diarrhea Musculoskeletal: no muscle/joint aches Skin: no rashes, no hyperemia Neurological: no tremors, no numbness, no tingling, no dizziness Psychiatric: no depression, no anxiety   Objective:    BP (!) 157/83 (BP Location: Left Arm, Patient Position: Sitting)   Pulse 92   Ht _0  (1.6 m)   Wt  245 lb (111.1 kg)   LMP 09/06/2016   BMI 43.40 kg/m   Wt Readings from Last 3 Encounters:  02/09/20 245 lb (111.1 kg)  11/26/19 243 lb 6.4 oz (110.4 kg)  11/03/19 242 lb 3.2 oz (109.9 kg)   BP Readings from Last 3 Encounters:  02/09/20 (!) 157/83  11/26/19 136/77  11/03/19 (!) 160/90     Physical Exam- Limited  Constitutional:  Body mass index is 43.4 kg/m. , not in acute distress, normal state of mind Eyes:  EOMI, no exophthalmos Neck: Supple Thyroid: No gross goiter Cardiovascular: RRR, no murmers, rubs, or gallops, no edema Respiratory: Adequate breathing efforts, no crackles, rales, rhonchi, or wheezing Musculoskeletal: no gross deformities, strength intact in all four extremities, no gross restriction of joint movements Skin:  no rashes, no hyperemia Neurological: no tremor with outstretched hands  Foot exam:   No rashes, ulcers, cuts, calluses, onychodystrophy.   Good pulses bilat.  Good sensation to 10 g monofilament bilat.  + varicose veins to Bilat feet   CMP ( most recent) CMP     Component Value Date/Time   NA 141 12/09/2019 0939   K 4.4 12/09/2019 0939   CL 103 12/09/2019 0939   CO2 30 12/09/2019 0939   GLUCOSE 149 (H) 12/09/2019 0939   BUN 11 12/09/2019 0939   BUN 12 07/23/2019 0000   CREATININE 0.82 12/09/2019 0939   CALCIUM 9.1 12/09/2019 0939   PROT 6.4 12/09/2019 0939   ALBUMIN 3.7 03/10/2019 1321   AST 55 (H) 12/09/2019 0939   ALT 33 (H) 12/09/2019 0939   ALKPHOS 82 03/10/2019 1321   BILITOT 0.7 12/09/2019 0939   GFRNONAA 83 07/22/2019 0000   GFRAA 96 07/22/2019 0000     Diabetic Labs (most recent): Lab Results   Component Value Date   HGBA1C 8.0 (A) 02/09/2020   HGBA1C 7.5 (A) 11/02/2019   HGBA1C 9.2 07/23/2019     Lab Results  Component Value Date   TSH 1.85 07/23/2019      Assessment & Plan:   1) Uncontrolled type 2 diabetes mellitus with hyperglycemia (HCC)  - Wendy Kramer has currently uncontrolled symptomatic type 2 DM since  59 years of age.  She presents today with no meter or logs to review.  She states she is having trouble getting insurance to cover glucometer strips.  Her POCT A1c today is 8%, worsening from last visit of 7.5%.  She reports she has not been eating well or exercising optimally lately.  She denies any s/s of hypoglycemia.   - I had a long discussion with her about the progressive nature of diabetes and the pathology behind its complications. -her diabetes is complicated by obesity/sedentary life, active smoking and she remains at a high risk for more acute and chronic complications which include CAD, CVA, CKD, retinopathy, and neuropathy. These are all discussed in detail with her.  - Nutritional counseling repeated at each appointment due to patients tendency to fall back in to old habits.  - The patient admits there is a room for improvement in their diet and drink choices. -  Suggestion is made for the patient to avoid simple carbohydrates from their diet including Cakes, Sweet Desserts / Pastries, Ice Cream, Soda (diet and regular), Sweet Tea, Candies, Chips, Cookies, Sweet Pastries,  Store Bought Juices, Alcohol in Excess of  1-2 drinks a day, Artificial Sweeteners, Coffee Creamer, and "Sugar-free" Products. This will help patient to have stable blood glucose profile and potentially avoid unintended weight  gain.   - I encouraged the patient to switch to  unprocessed or minimally processed complex starch and increased protein intake (animal or plant source), fruits, and vegetables.   - Patient is advised to stick to a routine mealtimes to eat 3 meals  a day and  avoid unnecessary snacks ( to snack only to correct hypoglycemia).  - she will be scheduled with Jearld Fenton, RDN, CDE for diabetes education.  - I have approached her with the following individualized plan to manage  her diabetes and patient agrees:   -Given her recent loss of control, I discussed adding an additional agent to help her get her diabetes back on track.  She is hesitant to try new medications due to her extensive allergies to medications.  I discussed starting low dose Glipizide vs. incretin therapy with Trulicity or Ozempic.  She would like to think it over before deciding on which route to take.  -In the meantime, she is asked to monitor blood glucose at least twice daily and log her readings to bring with her to her follow up appointment in 1 week for review.  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  I am sending her to the lab to have her kidney function rechecked prior to that visit as well.  -She is advised to continue Metformin 1000 mg po twice daily with meals.  Discussed the potential need to decrease dose or discontinue medication if renal function is declining.   - Specific targets for  A1c;  LDL, HDL,  and Triglycerides were discussed with the patient.  2) Blood Pressure /Hypertension:   Her blood pressure is not controlled to target.  She does monitor her BP at home regularly.  She reports when the climate changes, her BP tends to run higher for about 1 week then returns to normal.  She is advised to continue Lisinopril 20 mg po daily for now.  Will monitor BP again in 1 week.  3) Lipids/Hyperlipidemia:  There is no recent lipid panel to review.  She is not currently on any lipid lowering agents.  She reports she recently had her physical with her PCP who had lipids checked.  She will have PCP forward [place] her labs for record as well.  4) Weight/Diet:  Her Body mass index is 43.4 kg/m.  -   clearly complicating her diabetes care.   she is  a candidate for  weight loss. I discussed with her the fact that loss of 5 - 10% of her  current body weight will have the most impact on her diabetes management.  Exercise, and detailed carbohydrates information provided  -  detailed on discharge instructions.  5) Chronic Care/Health Maintenance: -she  is on ACEI medications and is encouraged to initiate and continue to follow up with Ophthalmology, Dentist,  Podiatrist at least yearly or according to recommendations, and advised to  Quit smoking. I have recommended yearly flu vaccine and pneumonia vaccine at least every 5 years; moderate intensity exercise for up to 150 minutes weekly; and  sleep for at least 7 hours a day.  - she is  advised to maintain close follow up with Rosita Fire, MD for primary care needs, as well as her other providers for optimal and coordinated care.  - Time spent on this patient care encounter:  35 min, of which > 50% was spent in  counseling and the rest reviewing her blood glucose logs , discussing her hypoglycemia and hyperglycemia episodes, reviewing her current and  previous labs / studies  ( including abstraction from other facilities) and medications  doses and developing a  long term treatment plan and documenting her care.   Please refer to Patient Instructions for Blood Glucose Monitoring and Insulin/Medications Dosing Guide"  in media tab for additional information. Please  also refer to " Patient Self Inventory" in the Media  tab for reviewed elements of pertinent patient history.  Jacob Szeliga participated in the discussions, expressed understanding, and voiced agreement with the above plans.  All questions were answered to her satisfaction. she is encouraged to contact clinic should she have any questions or concerns prior to her return visit.   Follow up plan: - Return in about 1 week (around 02/16/2020) for Bring glucometer and logs, Diabetes follow up, Previsit labs.  Rayetta Pigg, [facility]  [facility] [address] [place], [place] [address] Phone: [phone] Fax: [phone]   02/09/2020, 3:57 PM

## 2020-02-09 NOTE — Patient Instructions (Signed)

## 2020-02-10 ENCOUNTER — Telehealth: Payer: Self-pay | Admitting: Internal Medicine

## 2020-02-10 NOTE — Telephone Encounter (Signed)
Please call patient. She has questions about a lab. [phone]

## 2020-02-10 NOTE — Telephone Encounter (Signed)
Spoke with pt. Pt was notified of lab test that were completed in September 2021.

## 2020-02-15 DIAGNOSIS — M542 Cervicalgia: Secondary | ICD-10-CM | POA: Diagnosis not present

## 2020-02-15 DIAGNOSIS — M545 Low back pain, unspecified: Secondary | ICD-10-CM | POA: Diagnosis not present

## 2020-02-15 DIAGNOSIS — M79601 Pain in right arm: Secondary | ICD-10-CM | POA: Diagnosis not present

## 2020-02-15 DIAGNOSIS — R252 Cramp and spasm: Secondary | ICD-10-CM | POA: Diagnosis not present

## 2020-02-15 DIAGNOSIS — I6990 Unspecified sequelae of unspecified cerebrovascular disease: Secondary | ICD-10-CM | POA: Diagnosis not present

## 2020-02-15 DIAGNOSIS — I679 Cerebrovascular disease, unspecified: Secondary | ICD-10-CM | POA: Diagnosis not present

## 2020-02-15 DIAGNOSIS — E1165 Type 2 diabetes mellitus with hyperglycemia: Secondary | ICD-10-CM | POA: Diagnosis not present

## 2020-02-15 DIAGNOSIS — I10 Essential (primary) hypertension: Secondary | ICD-10-CM | POA: Diagnosis not present

## 2020-02-15 DIAGNOSIS — F419 Anxiety disorder, unspecified: Secondary | ICD-10-CM | POA: Diagnosis not present

## 2020-02-16 DIAGNOSIS — E119 Type 2 diabetes mellitus without complications: Secondary | ICD-10-CM | POA: Diagnosis not present

## 2020-02-16 DIAGNOSIS — I10 Essential (primary) hypertension: Secondary | ICD-10-CM | POA: Diagnosis not present

## 2020-02-16 LAB — COMPREHENSIVE METABOLIC PANEL
ALT: 41 IU/L — ABNORMAL HIGH (ref 0–32)
AST: 70 IU/L — ABNORMAL HIGH (ref 0–40)
Albumin/Globulin Ratio: 1.7 (ref 1.2–2.2)
Albumin: 4.5 g/dL (ref 3.8–4.9)
Alkaline Phosphatase: 103 IU/L (ref 44–121)
BUN/Creatinine Ratio: 14 (ref 12–28)
BUN: 12 mg/dL (ref 8–27)
Bilirubin Total: 0.4 mg/dL (ref 0.0–1.2)
CO2: 25 mmol/L (ref 20–29)
Calcium: 9.8 mg/dL (ref 8.7–10.3)
Chloride: 103 mmol/L (ref 96–106)
Creatinine, Ser: 0.86 mg/dL (ref 0.57–1.00)
GFR calc Af Amer: 85 mL/min/{1.73_m2} (ref >59)
GFR calc non Af Amer: 74 mL/min/{1.73_m2} (ref >59)
Globulin, Total: 2.7 g/dL (ref 1.5–4.5)
Glucose: 162 mg/dL — ABNORMAL HIGH (ref 65–99)
Potassium: 4.9 mmol/L (ref 3.5–5.2)
Sodium: 142 mmol/L (ref 134–144)
Total Protein: 7.2 g/dL (ref 6.0–8.5)

## 2020-02-17 ENCOUNTER — Encounter: Payer: Self-pay | Admitting: Nurse Practitioner

## 2020-02-17 ENCOUNTER — Other Ambulatory Visit: Payer: Self-pay

## 2020-02-17 ENCOUNTER — Ambulatory Visit (INDEPENDENT_AMBULATORY_CARE_PROVIDER_SITE_OTHER): Payer: Medicare Other | Admitting: Nurse Practitioner

## 2020-02-17 VITALS — BP 143/68 | HR 96 | Ht 63.0 in | Wt 242.0 lb

## 2020-02-17 DIAGNOSIS — E782 Mixed hyperlipidemia: Secondary | ICD-10-CM | POA: Diagnosis not present

## 2020-02-17 DIAGNOSIS — E1165 Type 2 diabetes mellitus with hyperglycemia: Secondary | ICD-10-CM

## 2020-02-17 NOTE — Progress Notes (Signed)
02/17/2020, 11:51 AM   Endocrinology follow-up note  Subjective:    Patient ID: Wendy Kramer, female    DOB: [birth year].  Wendy Kramer is being seen in follow-up after she was seen in consultation for management of currently uncontrolled symptomatic diabetes requested by  Rosita Fire, MD.   Past Medical History:  Diagnosis Date  . Arthritis   . Cardiac murmur   . Cervical spine disease   . Diabetes mellitus without complication (HCC)    diet controlled  . Exogenous hyperlipidemia   . History of kidney stones   . HTN (hypertension)   . Morbid obesity due to excess calories ([place])   . Other chronic pain   . Panic disorder   . Patent foramen ovale    TEE 2018  . Prediabetes   . Sleep apnea   . Stroke [facility])    "mini-stroke" no deficits from this.  Marland Kitchen Unspecified asthma with (acute) exacerbation   . Vitamin D deficiency     Past Surgical History:  Procedure Laterality Date  . BIOPSY  03/05/2018   Procedure: BIOPSY;  Surgeon: Daneil Dolin, MD;  [facility];  Service: Endoscopy;;  gastric  . CHOLECYSTECTOMY    . COLONOSCOPY WITH PROPOFOL N/A 03/05/2018   Dr. Gala Romney: Diverticulosis in the sigmoid and descending colon.  3 colon polyps removed, 2 hyperplastic and one sessile serrated polyp.  Next colonoscopy 3 years.  . ESOPHAGOGASTRODUODENOSCOPY (EGD) WITH PROPOFOL N/A 03/05/2018   Dr. Gala Romney: Erosive reflux esophagitis, small hiatal hernia, erosive gastropathy with reactive gastropathy on biopsy.  No H. pylori.  Marland Kitchen HERNIA REPAIR     umbilical  . POLYPECTOMY  03/05/2018   Procedure: POLYPECTOMY;  Surgeon: Daneil Dolin, MD;  [facility];  Service: Endoscopy;;  colon  . SHOULDER ARTHROSCOPY Right   . TEE WITHOUT CARDIOVERSION  2018   patent foramen ovale  . TONSILLECTOMY AND ADENOIDECTOMY    . TUBAL LIGATION      Social History   Socioeconomic History  .  Marital status: Legally Separated    Spouse name: Not on file  . Number of children: Not on file  . Years of education: Not on file  . Highest education level: Not on file  Occupational History  . Not on file  Tobacco Use  . Smoking status: Current Every Day Smoker    Packs/day: 0.50    Years: 40.00    Pack years: 20.00    Types: Cigarettes  . Smokeless tobacco: Never Used  Vaping Use  . Vaping Use: Never used  Substance and Sexual Activity  . Alcohol use: No  . Drug use: No  . Sexual activity: Not Currently    Birth control/protection: Post-menopausal, Surgical    Comment: tubal  Other Topics Concern  . Not on file  Social History Narrative  . Not on file   Social Determinants of Health   Financial Resource Strain: High Risk  . Difficulty of Paying Living Expenses: Hard  Food Insecurity: No Food Insecurity  . Worried About Charity fundraiser in the Last Year: Never true  .  Ran Out of Food in the Last Year: Never true  Transportation Needs: No Transportation Needs  . Lack of Transportation (Medical): No  . Lack of Transportation (Non-Medical): No  Physical Activity: Inactive  . Days of Exercise per Week: 0 days  . Minutes of Exercise per Session: 0 min  Stress: Stress Concern Present  . Feeling of Stress : To some extent  Social Connections: Socially Isolated  . Frequency of Communication with Friends and Family: More than three times a week  . Frequency of Social Gatherings with Friends and Family: More than three times a week  . Attends Religious Services: Never  . Active Member of Clubs or Organizations: No  . Attends Archivist Meetings: Never  . Marital Status: Separated    Family History  Problem Relation Age of Onset  . Breast cancer Maternal Aunt   . Colon cancer Other        maternal great grandfather  . Liver disease Brother        Fatty liver  . HIV Brother   . Hypertension Mother   . Hyperlipidemia Mother   . Hypertension Father   .  Thyroid disease Sister   . Pancreatic cancer Sister   . Healthy Daughter   . Healthy Son   . Healthy Son   . Heart disease Son     Outpatient Encounter Medications as of 02/17/2020  Medication Sig  . acetaminophen (TYLENOL) 500 MG tablet Take 500 mg by mouth as needed.   Marland Kitchen albuterol (PROVENTIL HFA;VENTOLIN HFA) 108 (90 Base) MCG/ACT inhaler Inhale 1-2 puffs into the lungs as needed for wheezing or shortness of breath.   . ALPRAZolam (XANAX) 0.5 MG tablet Take 0.25 mg by mouth daily as needed for anxiety.   . blood glucose meter kit and supplies KIT 1 each by Other route daily. Dispense based on patient and insurance preference. Use once a day as directed. (FOR ICD-9 250.00, 250.01).  . Blood Glucose Monitoring Suppl (ACCU-CHEK GUIDE ME) w/Device KIT 1 Piece by Does not apply route as directed.  . budesonide-formoterol (SYMBICORT) 80-4.5 MCG/ACT inhaler Inhale 1 puff into the lungs 2 (two) times daily.   . Cholecalciferol (VITAMIN D3) 50 MCG (2000 UT) TABS Take 2,000 Units by mouth daily.   . clopidogrel (PLAVIX) 75 MG tablet Take 1 tablet (75 mg total) by mouth daily. (Patient taking differently: Take 75 mg by mouth once a week. )  . cyclobenzaprine (FLEXERIL) 5 MG tablet Take 5 mg by mouth daily as needed for muscle spasms.   . fluticasone (FLONASE) 50 MCG/ACT nasal spray Place 1 spray into both nostrils daily as needed for allergies or rhinitis.   Marland Kitchen glucose blood (ACCU-CHEK GUIDE) test strip 1 each by Other route daily. Use 1 x a day to monitor BG  . HYDROcodone-acetaminophen (NORCO/VICODIN) 5-325 MG tablet Take 1 tablet by mouth daily as needed.  Marland Kitchen lisinopril (ZESTRIL) 20 MG tablet Take 20 mg by mouth 2 (two) times daily.   . metFORMIN (GLUCOPHAGE) 1000 MG tablet Take 1,000 mg by mouth 2 (two) times daily.  . Multiple Vitamin (MULTIVITAMIN ADULT PO) Take 1 tablet by mouth daily.  . RABEprazole (ACIPHEX) 20 MG tablet TAKE 1 TABLET BY MOUTH EVERY DAY  . traMADol (ULTRAM) 50 MG tablet Take  50 mg by mouth every 6 (six) hours as needed for moderate pain.   Marland Kitchen triamcinolone (KENALOG) 0.025 % cream Apply topically 2 (two) times daily.   No facility-administered encounter medications on file as  of 02/17/2020.    ALLERGIES: Allergies  Allergen Reactions  . Asa [Aspirin] Anaphylaxis  . Augmentin [Amoxicillin-Pot Clavulanate] Other (See Comments)    "Retching and pass out"  . Levaquin [Levofloxacin In D5w] Itching and Other (See Comments)    "body turned red"  . Lovenox [Enoxaparin Sodium] Other (See Comments)    phsyician instructed not to use ever again. "white veins popped out"    VACCINATION STATUS:  There is no immunization history on file for this patient.  Diabetes She presents for her follow-up diabetic visit. She has type 2 diabetes mellitus. Onset time: She was diagnosed at approximate age of 36 years, after several years of prediabetes. Her disease course has been improving. There are no hypoglycemic associated symptoms. Pertinent negatives for hypoglycemia include no confusion, headaches, pallor or seizures. Pertinent negatives for diabetes include no chest pain, no fatigue, no polydipsia, no polyphagia and no polyuria. There are no hypoglycemic complications. Symptoms are improving. Diabetic complications include a CVA. Risk factors for coronary artery disease include diabetes mellitus, dyslipidemia, family history, obesity, tobacco exposure, post-menopausal and sedentary lifestyle. Current diabetic treatment includes oral agent (monotherapy). She is compliant with treatment most of the time. Her weight is increasing steadily. She is following a generally unhealthy diet. When asked about meal planning, she reported none. She has not had a previous visit with a dietitian. She never participates in exercise. Her home blood glucose trend is decreasing steadily. Her breakfast blood glucose range is generally 110-130 mg/dl. Her bedtime blood glucose range is generally 140-180  mg/dl. (She presents today, without her meter and logs to review.  She reports her AM glucose ranges between 117-122 and her PM glucose (when she checks it is around 150.  She denies any episodes of hypoglycemia.  Her last A1c on 02/09/20 was 8%, worsening.  She was offered Glipizide or incretin therapy in addition to her Metformin.  She wanted to wait on these medications due to her sensitivity to medications.) An ACE inhibitor/angiotensin II receptor blocker is being taken. She does not see a podiatrist.Eye exam is current.  Hyperlipidemia This is a chronic problem. The current episode started more than 1 year ago. Factors aggravating her hyperlipidemia include fatty foods. Pertinent negatives include no chest pain, myalgias or shortness of breath. She is currently on no antihyperlipidemic treatment. Compliance problems include adherence to diet and adherence to exercise.  Risk factors for coronary artery disease include diabetes mellitus, hypertension, obesity, post-menopausal and a sedentary lifestyle.  Hypertension This is a chronic problem. The current episode started more than 1 year ago. The problem has been waxing and waning since onset. The problem is uncontrolled. Pertinent negatives include no chest pain, headaches, palpitations or shortness of breath. There are no associated agents to hypertension. Risk factors for coronary artery disease include dyslipidemia, diabetes mellitus, obesity, smoking/tobacco exposure, sedentary lifestyle, post-menopausal state and family history. Past treatments include ACE inhibitors. The current treatment provides moderate improvement. Compliance problems include diet and exercise.  Hypertensive end-organ damage includes CVA. Identifiable causes of hypertension include sleep apnea.    Review of systems  Constitutional: + Minimally fluctuating body weight,  current Body mass index is 42.87 kg/m. , no fatigue, no subjective hyperthermia, no subjective  hypothermia Eyes: no blurry vision, no xerophthalmia ENT: no sore throat, no nodules palpated in throat, no dysphagia/odynophagia, no hoarseness Cardiovascular: no chest pain, no shortness of breath, no palpitations, no leg swelling Respiratory: no cough, no shortness of breath Gastrointestinal: no nausea/vomiting/diarrhea Musculoskeletal: no muscle/joint aches  Skin: no rashes, no hyperemia Neurological: no tremors, no numbness, no tingling, no dizziness Psychiatric: no depression, no anxiety   Objective:    BP (!) 143/68 (BP Location: Left Arm)   Pulse 96   Ht '5\' 3"'  (1.6 m)   Wt 242 lb (109.8 kg)   LMP 09/06/2016   BMI 42.87 kg/m   Wt Readings from Last 3 Encounters:  02/17/20 242 lb (109.8 kg)  02/09/20 245 lb (111.1 kg)  11/26/19 243 lb 6.4 oz (110.4 kg)   BP Readings from Last 3 Encounters:  02/17/20 (!) 143/68  02/09/20 (!) 157/83  11/26/19 136/77     Physical Exam- Limited  Constitutional:  Body mass index is 42.87 kg/m. , not in acute distress, normal state of mind Eyes:  EOMI, no exophthalmos Neck: Supple Thyroid: No gross goiter Cardiovascular: RRR, no murmers, rubs, or gallops, no edema Respiratory: Adequate breathing efforts, no crackles, rales, rhonchi, or wheezing Musculoskeletal: no gross deformities, strength intact in all four extremities, no gross restriction of joint movements Skin:  no rashes, no hyperemia Neurological: no tremor with outstretched hands    POCT ABI Results 02/17/20   Right ABI:  1.23      Left ABI:  1.29  Right leg systolic / diastolic: 144/31 mmHg Left leg systolic / diastolic: 540/08 mmHg  Arm systolic / diastolic: 676/19 mmHG  Detailed report will be scanned into patient chart.    CMP ( most recent) CMP     Component Value Date/Time   NA 142 02/15/2020 1018   K 4.9 02/15/2020 1018   CL 103 02/15/2020 1018   CO2 25 02/15/2020 1018   GLUCOSE 162 (H) 02/15/2020 1018   GLUCOSE 149 (H) 12/09/2019 0939   BUN 12  02/15/2020 1018   CREATININE 0.86 02/15/2020 1018   CREATININE 0.82 12/09/2019 0939   CALCIUM 9.8 02/15/2020 1018   PROT 7.2 02/15/2020 1018   ALBUMIN 4.5 02/15/2020 1018   AST 70 (H) 02/15/2020 1018   ALT 41 (H) 02/15/2020 1018   ALKPHOS 103 02/15/2020 1018   BILITOT 0.4 02/15/2020 1018   GFRNONAA 74 02/15/2020 1018   GFRAA 85 02/15/2020 1018     Diabetic Labs (most recent): Lab Results  Component Value Date   HGBA1C 8.0 (A) 02/09/2020   HGBA1C 7.5 (A) 11/02/2019   HGBA1C 9.2 07/23/2019     Lab Results  Component Value Date   TSH 1.85 07/23/2019      Assessment & Plan:   1) Uncontrolled type 2 diabetes mellitus with hyperglycemia (HCC)  - Wendy Kramer has currently uncontrolled symptomatic type 2 DM since  59 years of age.  She presents today, without her meter and logs to review.  She reports her AM glucose ranges between 117-122 and her PM glucose (when she checks it is around 150.  She denies any episodes of hypoglycemia.  Her last A1c on 02/09/20 was 8%, worsening.  She was offered Glipizide or incretin therapy in addition to her Metformin.  She wanted to wait on these medications due to her sensitivity to medications.  - I had a long discussion with her about the progressive nature of diabetes and the pathology behind its complications. -her diabetes is complicated by obesity/sedentary life, active smoking and she remains at a high risk for more acute and chronic complications which include CAD, CVA, CKD, retinopathy, and neuropathy. These are all discussed in detail with her.  - Nutritional counseling repeated at each appointment due to patients tendency to fall back  in to old habits.  - The patient admits there is a room for improvement in their diet and drink choices. -  Suggestion is made for the patient to avoid simple carbohydrates from their diet including Cakes, Sweet Desserts / Pastries, Ice Cream, Soda (diet and regular), Sweet Tea, Candies, Chips,  Cookies, Sweet Pastries,  Store Bought Juices, Alcohol in Excess of  1-2 drinks a day, Artificial Sweeteners, Coffee Creamer, and "Sugar-free" Products. This will help patient to have stable blood glucose profile and potentially avoid unintended weight gain.   - I encouraged the patient to switch to  unprocessed or minimally processed complex starch and increased protein intake (animal or plant source), fruits, and vegetables.   - Patient is advised to stick to a routine mealtimes to eat 3 meals  a day and avoid unnecessary snacks ( to snack only to correct hypoglycemia).  - she will be scheduled with Jearld Fenton, RDN, CDE for diabetes education.  - I have approached her with the following individualized plan to manage  her diabetes and patient agrees:   -Given her recent loss of control, I discussed adding an additional agent to help her get her diabetes back on track.  She is hesitant to try new medications due to her extensive allergies to medications.  I discussed starting low dose Glipizide vs. incretin therapy with Trulicity or Ozempic.  After taking the week to think about it, she has asked for time to work on lifestyle modifications (she has successfully demonstrated her ability to do so previously).  -She is encouraged to monitor blood glucose once daily, before breakfast and to call the clinic if she has readings less than 70 or greater than 200 for 3 tests in a row.  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  I sent her to the lab to recheck kidney function which was stable at CKD stage 2.  -She is advised to continue Metformin 1000 mg po twice daily with meals.  Discussed the potential need to decrease dose or discontinue medication if renal function is declining (but this is not needed at this time).   - Specific targets for  A1c;  LDL, HDL,  and Triglycerides were discussed with the patient.  2) Blood Pressure /Hypertension:   Her blood pressure is not controlled to target.   She does monitor her BP at home regularly.  She reports when the climate changes, her BP tends to run higher for about 1 week then returns to normal.  Her PCP recently increased her Lisinopril to 20 mg po twice daily- she is advised to continue.  3) Lipids/Hyperlipidemia:  There is no recent lipid panel to review.  She is not currently on any lipid lowering agents.  She reports she recently had her physical with her PCP who had lipids checked.  She will have PCP forward [place] her labs for record as well.  4) Weight/Diet:  Her Body mass index is 42.87 kg/m.  -   clearly complicating her diabetes care.   she is  a candidate for weight loss. I discussed with her the fact that loss of 5 - 10% of her  current body weight will have the most impact on her diabetes management.  Exercise, and detailed carbohydrates information provided  -  detailed on discharge instructions.  5) Chronic Care/Health Maintenance: -she  is on ACEI medications and is encouraged to initiate and continue to follow up with Ophthalmology, Dentist,  Podiatrist at least yearly or according to recommendations, and  advised to  Quit smoking. I have recommended yearly flu vaccine and pneumonia vaccine at least every 5 years; moderate intensity exercise for up to 150 minutes weekly; and  sleep for at least 7 hours a day.  - she is  advised to maintain close follow up with Rosita Fire, MD for primary care needs, as well as her other providers for optimal and coordinated care.  - Time spent on this patient care encounter:  35 min, of which > 50% was spent in  counseling and the rest reviewing her blood glucose logs , discussing her hypoglycemia and hyperglycemia episodes, reviewing her current and  previous labs / studies  ( including abstraction from other facilities) and medications  doses and developing a  long term treatment plan and documenting her care.   Please refer to Patient Instructions for Blood Glucose Monitoring and  Insulin/Medications Dosing Guide"  in media tab for additional information. Please  also refer to " Patient Self Inventory" in the Media  tab for reviewed elements of pertinent patient history.  Wendy Kramer participated in the discussions, expressed understanding, and voiced agreement with the above plans.  All questions were answered to her satisfaction. she is encouraged to contact clinic should she have any questions or concerns prior to her return visit.   Follow up plan: - Return in about 4 months (around 06/16/2020) for Diabetes follow up with A1c in office, No previsit labs, Bring glucometer and logs.  Rayetta Pigg, [facility] [address] [place],  [address] Phone: [phone] Fax: [phone]   02/17/2020, 11:51 AM

## 2020-02-17 NOTE — Patient Instructions (Signed)

## 2020-02-28 NOTE — Progress Notes (Deleted)
Office Visit Note  Patient: Wendy Kramer             Date of Birth: 14-May-1960           MRN: [id number]             PCP: Rosita Fire, MD Referring: Rosita Fire, MD Visit Date: 03/09/2020 Occupation: @GUAROCC @  Subjective:  No chief complaint on file.   History of Present Illness: Wendy Kramer is a 59 y.o. female ***   Activities of Daily Living:  Patient reports morning stiffness for *** {minute/hour:19697}.   Patient {ACTIONS;DENIES/REPORTS:21021675::"Denies"} nocturnal pain.  Difficulty dressing/grooming: {ACTIONS;DENIES/REPORTS:21021675::"Denies"} Difficulty climbing stairs: {ACTIONS;DENIES/REPORTS:21021675::"Denies"} Difficulty getting out of chair: {ACTIONS;DENIES/REPORTS:21021675::"Denies"} Difficulty using hands for taps, buttons, cutlery, and/or writing: {ACTIONS;DENIES/REPORTS:21021675::"Denies"}  No Rheumatology ROS completed.   PMFS History:  Patient Active Problem List   Diagnosis Date Noted  . LUQ pain 11/26/2019  . Encounter for gynecological examination with Papanicolaou smear of cervix 10/06/2019  . Routine cervical smear 10/06/2019  . Encounter for screening fecal occult blood testing 10/06/2019  . Uncontrolled type 2 diabetes mellitus with hyperglycemia ([place]) 09/27/2019  . Mixed hyperlipidemia 09/27/2019  . Morbid obesity ([place]) 09/27/2019  . Abnormal LFTs 03/16/2019  . Diabetes mellitus without complication ([place])   . Positive colorectal cancer screening using Cologuard test 12/24/2017  . Abdominal pain, epigastric 12/24/2017  . GERD (gastroesophageal reflux disease) 12/24/2017  . Fatty liver 12/24/2017    Past Medical History:  Diagnosis Date  . Arthritis   . Cardiac murmur   . Cervical spine disease   . Diabetes mellitus without complication (HCC)    diet controlled  . Exogenous hyperlipidemia   . History of kidney stones   . HTN (hypertension)   . Morbid obesity due to excess calories ([place])   . Other chronic pain   . Panic disorder    . Patent foramen ovale    TEE 2018  . Prediabetes   . Sleep apnea   . Stroke [facility])    "mini-stroke" no deficits from this.  Marland Kitchen Unspecified asthma with (acute) exacerbation   . Vitamin D deficiency     Family History  Problem Relation Age of Onset  . Breast cancer Maternal Aunt   . Colon cancer Other        maternal great grandfather  . Liver disease Brother        Fatty liver  . HIV Brother   . Hypertension Mother   . Hyperlipidemia Mother   . Hypertension Father   . Thyroid disease Sister   . Pancreatic cancer Sister   . Healthy Daughter   . Healthy Son   . Healthy Son   . Heart disease Son    Past Surgical History:  Procedure Laterality Date  . BIOPSY  03/05/2018   Procedure: BIOPSY;  Surgeon: Daneil Dolin, MD;  [facility];  Service: Endoscopy;;  gastric  . CHOLECYSTECTOMY    . COLONOSCOPY WITH PROPOFOL N/A 03/05/2018   Dr. Gala Romney: Diverticulosis in the sigmoid and descending colon.  3 colon polyps removed, 2 hyperplastic and one sessile serrated polyp.  Next colonoscopy 3 years.  . ESOPHAGOGASTRODUODENOSCOPY (EGD) WITH PROPOFOL N/A 03/05/2018   Dr. Gala Romney: Erosive reflux esophagitis, small hiatal hernia, erosive gastropathy with reactive gastropathy on biopsy.  No H. pylori.  Marland Kitchen HERNIA REPAIR     umbilical  . POLYPECTOMY  03/05/2018   Procedure: POLYPECTOMY;  Surgeon: Daneil Dolin, MD;  [facility];  Service: Endoscopy;;  colon  .  SHOULDER ARTHROSCOPY Right   . TEE WITHOUT CARDIOVERSION  2018   patent foramen ovale  . TONSILLECTOMY AND ADENOIDECTOMY    . TUBAL LIGATION     Social History   Social History Narrative  . Not on file    There is no immunization history on file for this patient.   Objective: Vital Signs: LMP 09/06/2016    Physical Exam   Musculoskeletal Exam: ***  CDAI Exam: CDAI Score: -- Patient Global: --; Provider Global: -- Swollen: --; Tender: -- Joint Exam 03/09/2020   No joint exam has been documented  for this visit   There is currently no information documented on the homunculus. Go to the Rheumatology activity and complete the homunculus joint exam.  Investigation: No additional findings.  Imaging: No results found.  Recent Labs: Lab Results  Component Value Date   WBC 8.4 12/09/2019   HGB 14.4 12/09/2019   PLT 204 12/09/2019   NA 142 02/15/2020   K 4.9 02/15/2020   CL 103 02/15/2020   CO2 25 02/15/2020   GLUCOSE 162 (H) 02/15/2020   BUN 12 02/15/2020   CREATININE 0.86 02/15/2020   BILITOT 0.4 02/15/2020   ALKPHOS 103 02/15/2020   AST 70 (H) 02/15/2020   ALT 41 (H) 02/15/2020   PROT 7.2 02/15/2020   ALBUMIN 4.5 02/15/2020   CALCIUM 9.8 02/15/2020   GFRAA 85 02/15/2020    Speciality Comments: No specialty comments available.  Procedures:  No procedures performed Allergies: Asa [aspirin], Augmentin [amoxicillin-pot clavulanate], Levaquin [levofloxacin in d5w], and Lovenox [enoxaparin sodium]   Assessment / Plan:     Visit Diagnoses: No diagnosis found.  Orders: No orders of the defined types were placed in this encounter.  No orders of the defined types were placed in this encounter.   Face-to-face time spent with patient was *** minutes. Greater than 50% of time was spent in counseling and coordination of care.  Follow-Up Instructions: No follow-ups on file.   Earnestine Mealing, CMA  Note - This record has been created using Editor, commissioning.  Chart creation errors have been sought, but may not always  have been located. Such creation errors do not reflect on  the standard of medical care.

## 2020-03-08 NOTE — Progress Notes (Deleted)
Office Visit Note  Patient: Wendy Kramer             Date of Birth: 1961-01-30           MRN: [id number]             PCP: Rosita Fire, MD Referring: Rosita Fire, MD Visit Date: 03/21/2020 Occupation: @GUAROCC @  Subjective:  No chief complaint on file.   History of Present Illness: Wendy Kramer is a 59 y.o. female ***   Activities of Daily Living:  Patient reports morning stiffness for *** {minute/hour:19697}.   Patient {ACTIONS;DENIES/REPORTS:21021675::"Denies"} nocturnal pain.  Difficulty dressing/grooming: {ACTIONS;DENIES/REPORTS:21021675::"Denies"} Difficulty climbing stairs: {ACTIONS;DENIES/REPORTS:21021675::"Denies"} Difficulty getting out of chair: {ACTIONS;DENIES/REPORTS:21021675::"Denies"} Difficulty using hands for taps, buttons, cutlery, and/or writing: {ACTIONS;DENIES/REPORTS:21021675::"Denies"}  No Rheumatology ROS completed.   PMFS History:  Patient Active Problem List   Diagnosis Date Noted  . LUQ pain 11/26/2019  . Encounter for gynecological examination with Papanicolaou smear of cervix 10/06/2019  . Routine cervical smear 10/06/2019  . Encounter for screening fecal occult blood testing 10/06/2019  . Uncontrolled type 2 diabetes mellitus with hyperglycemia ([place]) 09/27/2019  . Mixed hyperlipidemia 09/27/2019  . Morbid obesity ([place]) 09/27/2019  . Abnormal LFTs 03/16/2019  . Diabetes mellitus without complication ([place])   . Positive colorectal cancer screening using Cologuard test 12/24/2017  . Abdominal pain, epigastric 12/24/2017  . GERD (gastroesophageal reflux disease) 12/24/2017  . Fatty liver 12/24/2017    Past Medical History:  Diagnosis Date  . Arthritis   . Cardiac murmur   . Cervical spine disease   . Diabetes mellitus without complication (HCC)    diet controlled  . Exogenous hyperlipidemia   . History of kidney stones   . HTN (hypertension)   . Morbid obesity due to excess calories ([place])   . Other chronic pain   . Panic disorder    . Patent foramen ovale    TEE 2018  . Prediabetes   . Sleep apnea   . Stroke [facility])    "mini-stroke" no deficits from this.  Marland Kitchen Unspecified asthma with (acute) exacerbation   . Vitamin D deficiency     Family History  Problem Relation Age of Onset  . Breast cancer Maternal Aunt   . Colon cancer Other        maternal great grandfather  . Liver disease Brother        Fatty liver  . HIV Brother   . Hypertension Mother   . Hyperlipidemia Mother   . Hypertension Father   . Thyroid disease Sister   . Pancreatic cancer Sister   . Healthy Daughter   . Healthy Son   . Healthy Son   . Heart disease Son    Past Surgical History:  Procedure Laterality Date  . BIOPSY  03/05/2018   Procedure: BIOPSY;  Surgeon: Daneil Dolin, MD;  [facility];  Service: Endoscopy;;  gastric  . CHOLECYSTECTOMY    . COLONOSCOPY WITH PROPOFOL N/A 03/05/2018   Dr. Gala Romney: Diverticulosis in the sigmoid and descending colon.  3 colon polyps removed, 2 hyperplastic and one sessile serrated polyp.  Next colonoscopy 3 years.  . ESOPHAGOGASTRODUODENOSCOPY (EGD) WITH PROPOFOL N/A 03/05/2018   Dr. Gala Romney: Erosive reflux esophagitis, small hiatal hernia, erosive gastropathy with reactive gastropathy on biopsy.  No H. pylori.  Marland Kitchen HERNIA REPAIR     umbilical  . POLYPECTOMY  03/05/2018   Procedure: POLYPECTOMY;  Surgeon: Daneil Dolin, MD;  [facility];  Service: Endoscopy;;  colon  .  SHOULDER ARTHROSCOPY Right   . TEE WITHOUT CARDIOVERSION  2018   patent foramen ovale  . TONSILLECTOMY AND ADENOIDECTOMY    . TUBAL LIGATION     Social History   Social History Narrative  . Not on file    There is no immunization history on file for this patient.   Objective: Vital Signs: LMP 09/06/2016    Physical Exam   Musculoskeletal Exam: ***  CDAI Exam: CDAI Score: -- Patient Global: --; Provider Global: -- Swollen: --; Tender: -- Joint Exam 03/21/2020   No joint exam has been documented  for this visit   There is currently no information documented on the homunculus. Go to the Rheumatology activity and complete the homunculus joint exam.  Investigation: No additional findings.  Imaging: No results found.  Recent Labs: Lab Results  Component Value Date   WBC 8.4 12/09/2019   HGB 14.4 12/09/2019   PLT 204 12/09/2019   NA 142 02/15/2020   K 4.9 02/15/2020   CL 103 02/15/2020   CO2 25 02/15/2020   GLUCOSE 162 (H) 02/15/2020   BUN 12 02/15/2020   CREATININE 0.86 02/15/2020   BILITOT 0.4 02/15/2020   ALKPHOS 103 02/15/2020   AST 70 (H) 02/15/2020   ALT 41 (H) 02/15/2020   PROT 7.2 02/15/2020   ALBUMIN 4.5 02/15/2020   CALCIUM 9.8 02/15/2020   GFRAA 85 02/15/2020    Speciality Comments: No specialty comments available.  Procedures:  No procedures performed Allergies: Asa [aspirin], Augmentin [amoxicillin-pot clavulanate], Levaquin [levofloxacin in d5w], and Lovenox [enoxaparin sodium]   Assessment / Plan:     Visit Diagnoses: No diagnosis found.  Orders: No orders of the defined types were placed in this encounter.  No orders of the defined types were placed in this encounter.   Face-to-face time spent with patient was *** minutes. Greater than 50% of time was spent in counseling and coordination of care.  Follow-Up Instructions: No follow-ups on file.   Earnestine Mealing, CMA  Note - This record has been created using Editor, commissioning.  Chart creation errors have been sought, but may not always  have been located. Such creation errors do not reflect on  the standard of medical care.

## 2020-03-09 ENCOUNTER — Ambulatory Visit: Payer: Medicare Other | Admitting: Rheumatology

## 2020-03-17 DIAGNOSIS — I10 Essential (primary) hypertension: Secondary | ICD-10-CM | POA: Diagnosis not present

## 2020-03-17 DIAGNOSIS — E119 Type 2 diabetes mellitus without complications: Secondary | ICD-10-CM | POA: Diagnosis not present

## 2020-03-21 ENCOUNTER — Ambulatory Visit: Payer: Medicare Other | Admitting: Physician Assistant

## 2020-03-21 DIAGNOSIS — R768 Other specified abnormal immunological findings in serum: Secondary | ICD-10-CM

## 2020-03-21 DIAGNOSIS — R945 Abnormal results of liver function studies: Secondary | ICD-10-CM

## 2020-03-21 DIAGNOSIS — K760 Fatty (change of) liver, not elsewhere classified: Secondary | ICD-10-CM

## 2020-03-21 DIAGNOSIS — M5030 Other cervical disc degeneration, unspecified cervical region: Secondary | ICD-10-CM

## 2020-03-21 DIAGNOSIS — Z8719 Personal history of other diseases of the digestive system: Secondary | ICD-10-CM

## 2020-03-21 DIAGNOSIS — Z8709 Personal history of other diseases of the respiratory system: Secondary | ICD-10-CM

## 2020-03-21 DIAGNOSIS — E119 Type 2 diabetes mellitus without complications: Secondary | ICD-10-CM

## 2020-03-21 DIAGNOSIS — Q211 Atrial septal defect: Secondary | ICD-10-CM

## 2020-03-21 DIAGNOSIS — E559 Vitamin D deficiency, unspecified: Secondary | ICD-10-CM

## 2020-03-21 DIAGNOSIS — Z8669 Personal history of other diseases of the nervous system and sense organs: Secondary | ICD-10-CM

## 2020-03-21 DIAGNOSIS — I10 Essential (primary) hypertension: Secondary | ICD-10-CM

## 2020-03-21 DIAGNOSIS — G8929 Other chronic pain: Secondary | ICD-10-CM

## 2020-03-21 DIAGNOSIS — R195 Other fecal abnormalities: Secondary | ICD-10-CM

## 2020-03-29 NOTE — Progress Notes (Deleted)
Office Visit Note  Patient: Wendy Kramer             Date of Birth: [birth year]           MRN: [id number]             PCP: Avon Gully, MD Referring: Avon Gully, MD Visit Date: 04/11/2020 Occupation: @GUAROCC @  Subjective:  No chief complaint on file.   History of Present Illness: Wendy Kramer is a 59 y.o. female ***   Activities of Daily Living:  Patient reports morning stiffness for *** {minute/hour:19697}.   Patient {ACTIONS;DENIES/REPORTS:21021675::"Denies"} nocturnal pain.  Difficulty dressing/grooming: {ACTIONS;DENIES/REPORTS:21021675::"Denies"} Difficulty climbing stairs: {ACTIONS;DENIES/REPORTS:21021675::"Denies"} Difficulty getting out of chair: {ACTIONS;DENIES/REPORTS:21021675::"Denies"} Difficulty using hands for taps, buttons, cutlery, and/or writing: {ACTIONS;DENIES/REPORTS:21021675::"Denies"}  No Rheumatology ROS completed.   PMFS History:  Patient Active Problem List   Diagnosis Date Noted  . LUQ pain 11/26/2019  . Encounter for gynecological examination with Papanicolaou smear of cervix 10/06/2019  . Routine cervical smear 10/06/2019  . Encounter for screening fecal occult blood testing 10/06/2019  . Uncontrolled type 2 diabetes mellitus with hyperglycemia (HCC) 09/27/2019  . Mixed hyperlipidemia 09/27/2019  . Morbid obesity (HCC) 09/27/2019  . Abnormal LFTs 03/16/2019  . Diabetes mellitus without complication (HCC)   . Positive colorectal cancer screening using Cologuard test 12/24/2017  . Abdominal pain, epigastric 12/24/2017  . GERD (gastroesophageal reflux disease) 12/24/2017  . Fatty liver 12/24/2017    Past Medical History:  Diagnosis Date  . Arthritis   . Cardiac murmur   . Cervical spine disease   . Diabetes mellitus without complication (HCC)    diet controlled  . Exogenous hyperlipidemia   . History of kidney stones   . HTN (hypertension)   . Morbid obesity due to excess calories (HCC)   . Other chronic pain   . Panic disorder    . Patent foramen ovale    TEE 2018  . Prediabetes   . Sleep apnea   . Stroke [facility])    "mini-stroke" no deficits from this.  [facility] Unspecified asthma with (acute) exacerbation   . Vitamin D deficiency     Family History  Problem Relation Age of Onset  . Breast cancer Maternal Aunt   . Colon cancer Other        maternal great grandfather  . Liver disease Brother        Fatty liver  . HIV Brother   . Hypertension Mother   . Hyperlipidemia Mother   . Hypertension Father   . Thyroid disease Sister   . Pancreatic cancer Sister   . Healthy Daughter   . Healthy Son   . Healthy Son   . Heart disease Son    Past Surgical History:  Procedure Laterality Date  . BIOPSY  03/05/2018   Procedure: BIOPSY;  Surgeon: 14/07/2017, MD;  [facility];  Service: Endoscopy;;  gastric  . CHOLECYSTECTOMY    . COLONOSCOPY WITH PROPOFOL N/A 03/05/2018   Dr. 14/07/2017: Diverticulosis in the sigmoid and descending colon.  3 colon polyps removed, 2 hyperplastic and one sessile serrated polyp.  Next colonoscopy 3 years.  . ESOPHAGOGASTRODUODENOSCOPY (EGD) WITH PROPOFOL N/A 03/05/2018   Dr. 14/07/2017: Erosive reflux esophagitis, small hiatal hernia, erosive gastropathy with reactive gastropathy on biopsy.  No H. pylori.  Jena Gauss HERNIA REPAIR     umbilical  . POLYPECTOMY  03/05/2018   Procedure: POLYPECTOMY;  Surgeon: 14/07/2017, MD;  [facility];  Service: Endoscopy;;  colon  .  SHOULDER ARTHROSCOPY Right   . TEE WITHOUT CARDIOVERSION  2018   patent foramen ovale  . TONSILLECTOMY AND ADENOIDECTOMY    . TUBAL LIGATION     Social History   Social History Narrative  . Not on file    There is no immunization history on file for this patient.   Objective: Vital Signs: LMP 09/06/2016    Physical Exam   Musculoskeletal Exam: ***  CDAI Exam: CDAI Score: -- Patient Global: --; Provider Global: -- Swollen: --; Tender: -- Joint Exam 04/11/2020   No joint exam has been documented  for this visit   There is currently no information documented on the homunculus. Go to the Rheumatology activity and complete the homunculus joint exam.  Investigation: No additional findings.  Imaging: No results found.  Recent Labs: Lab Results  Component Value Date   WBC 8.4 12/09/2019   HGB 14.4 12/09/2019   PLT 204 12/09/2019   NA 142 02/15/2020   K 4.9 02/15/2020   CL 103 02/15/2020   CO2 25 02/15/2020   GLUCOSE 162 (H) 02/15/2020   BUN 12 02/15/2020   CREATININE 0.86 02/15/2020   BILITOT 0.4 02/15/2020   ALKPHOS 103 02/15/2020   AST 70 (H) 02/15/2020   ALT 41 (H) 02/15/2020   PROT 7.2 02/15/2020   ALBUMIN 4.5 02/15/2020   CALCIUM 9.8 02/15/2020   GFRAA 85 02/15/2020    Speciality Comments: No specialty comments available.  Procedures:  No procedures performed Allergies: Asa [aspirin], Augmentin [amoxicillin-pot clavulanate], Levaquin [levofloxacin in d5w], and Lovenox [enoxaparin sodium]   Assessment / Plan:     Visit Diagnoses: No diagnosis found.  Orders: No orders of the defined types were placed in this encounter.  No orders of the defined types were placed in this encounter.   Face-to-face time spent with patient was *** minutes. Greater than 50% of time was spent in counseling and coordination of care.  Follow-Up Instructions: No follow-ups on file.   Earnestine Mealing, CMA  Note - This record has been created using Editor, commissioning.  Chart creation errors have been sought, but may not always  have been located. Such creation errors do not reflect on  the standard of medical care.

## 2020-03-30 DIAGNOSIS — I10 Essential (primary) hypertension: Secondary | ICD-10-CM | POA: Diagnosis not present

## 2020-03-30 DIAGNOSIS — E119 Type 2 diabetes mellitus without complications: Secondary | ICD-10-CM | POA: Diagnosis not present

## 2020-04-11 ENCOUNTER — Ambulatory Visit: Payer: Medicare Other | Admitting: Physician Assistant

## 2020-04-11 DIAGNOSIS — I10 Essential (primary) hypertension: Secondary | ICD-10-CM | POA: Diagnosis not present

## 2020-04-11 DIAGNOSIS — I6990 Unspecified sequelae of unspecified cerebrovascular disease: Secondary | ICD-10-CM | POA: Diagnosis not present

## 2020-04-11 DIAGNOSIS — M79601 Pain in right arm: Secondary | ICD-10-CM | POA: Diagnosis not present

## 2020-04-11 DIAGNOSIS — M542 Cervicalgia: Secondary | ICD-10-CM | POA: Diagnosis not present

## 2020-04-11 DIAGNOSIS — R252 Cramp and spasm: Secondary | ICD-10-CM | POA: Diagnosis not present

## 2020-04-11 DIAGNOSIS — F419 Anxiety disorder, unspecified: Secondary | ICD-10-CM | POA: Diagnosis not present

## 2020-04-11 DIAGNOSIS — M545 Low back pain, unspecified: Secondary | ICD-10-CM | POA: Diagnosis not present

## 2020-04-11 DIAGNOSIS — I679 Cerebrovascular disease, unspecified: Secondary | ICD-10-CM | POA: Diagnosis not present

## 2020-04-19 ENCOUNTER — Other Ambulatory Visit: Payer: Self-pay | Admitting: Gastroenterology

## 2020-04-25 NOTE — Progress Notes (Deleted)
Office Visit Note  Patient: Wendy Kramer             Date of Birth: 16-Oct-1960           MRN: [id number]             PCP: Rosita Fire, MD Referring: Rosita Fire, MD Visit Date: 05/09/2020 Occupation: @GUAROCC @  Subjective:  No chief complaint on file.   History of Present Illness: Wendy Kramer is a 60 y.o. female ***   Activities of Daily Living:  Patient reports morning stiffness for *** {minute/hour:19697}.   Patient {ACTIONS;DENIES/REPORTS:21021675::"Denies"} nocturnal pain.  Difficulty dressing/grooming: {ACTIONS;DENIES/REPORTS:21021675::"Denies"} Difficulty climbing stairs: {ACTIONS;DENIES/REPORTS:21021675::"Denies"} Difficulty getting out of chair: {ACTIONS;DENIES/REPORTS:21021675::"Denies"} Difficulty using hands for taps, buttons, cutlery, and/or writing: {ACTIONS;DENIES/REPORTS:21021675::"Denies"}  No Rheumatology ROS completed.   PMFS History:  Patient Active Problem List   Diagnosis Date Noted  . LUQ pain 11/26/2019  . Encounter for gynecological examination with Papanicolaou smear of cervix 10/06/2019  . Routine cervical smear 10/06/2019  . Encounter for screening fecal occult blood testing 10/06/2019  . Uncontrolled type 2 diabetes mellitus with hyperglycemia ([place]) 09/27/2019  . Mixed hyperlipidemia 09/27/2019  . Morbid obesity ([place]) 09/27/2019  . Abnormal LFTs 03/16/2019  . Diabetes mellitus without complication ([place])   . Positive colorectal cancer screening using Cologuard test 12/24/2017  . Abdominal pain, epigastric 12/24/2017  . GERD (gastroesophageal reflux disease) 12/24/2017  . Fatty liver 12/24/2017    Past Medical History:  Diagnosis Date  . Arthritis   . Cardiac murmur   . Cervical spine disease   . Diabetes mellitus without complication (HCC)    diet controlled  . Exogenous hyperlipidemia   . History of kidney stones   . HTN (hypertension)   . Morbid obesity due to excess calories ([place])   . Other chronic pain   . Panic disorder    . Patent foramen ovale    TEE 2018  . Prediabetes   . Sleep apnea   . Stroke [facility])    "mini-stroke" no deficits from this.  Marland Kitchen Unspecified asthma with (acute) exacerbation   . Vitamin D deficiency     Family History  Problem Relation Age of Onset  . Breast cancer Maternal Aunt   . Colon cancer Other        maternal great grandfather  . Liver disease Brother        Fatty liver  . HIV Brother   . Hypertension Mother   . Hyperlipidemia Mother   . Hypertension Father   . Thyroid disease Sister   . Pancreatic cancer Sister   . Healthy Daughter   . Healthy Son   . Healthy Son   . Heart disease Son    Past Surgical History:  Procedure Laterality Date  . BIOPSY  03/05/2018   Procedure: BIOPSY;  Surgeon: Daneil Dolin, MD;  [facility];  Service: Endoscopy;;  gastric  . CHOLECYSTECTOMY    . COLONOSCOPY WITH PROPOFOL N/A 03/05/2018   Dr. Gala Romney: Diverticulosis in the sigmoid and descending colon.  3 colon polyps removed, 2 hyperplastic and one sessile serrated polyp.  Next colonoscopy 3 years.  . ESOPHAGOGASTRODUODENOSCOPY (EGD) WITH PROPOFOL N/A 03/05/2018   Dr. Gala Romney: Erosive reflux esophagitis, small hiatal hernia, erosive gastropathy with reactive gastropathy on biopsy.  No H. pylori.  Marland Kitchen HERNIA REPAIR     umbilical  . POLYPECTOMY  03/05/2018   Procedure: POLYPECTOMY;  Surgeon: Daneil Dolin, MD;  [facility];  Service: Endoscopy;;  colon  .  SHOULDER ARTHROSCOPY Right   . TEE WITHOUT CARDIOVERSION  2018   patent foramen ovale  . TONSILLECTOMY AND ADENOIDECTOMY    . TUBAL LIGATION     Social History   Social History Narrative  . Not on file    There is no immunization history on file for this patient.   Objective: Vital Signs: LMP 09/06/2016    Physical Exam   Musculoskeletal Exam: ***  CDAI Exam: CDAI Score: - Patient Global: -; Provider Global: - Swollen: -; Tender: - Joint Exam 05/09/2020   No joint exam has been documented for  this visit   There is currently no information documented on the homunculus. Go to the Rheumatology activity and complete the homunculus joint exam.  Investigation: No additional findings.  Imaging: No results found.  Recent Labs: Lab Results  Component Value Date   WBC 8.4 12/09/2019   HGB 14.4 12/09/2019   PLT 204 12/09/2019   NA 142 02/15/2020   K 4.9 02/15/2020   CL 103 02/15/2020   CO2 25 02/15/2020   GLUCOSE 162 (H) 02/15/2020   BUN 12 02/15/2020   CREATININE 0.86 02/15/2020   BILITOT 0.4 02/15/2020   ALKPHOS 103 02/15/2020   AST 70 (H) 02/15/2020   ALT 41 (H) 02/15/2020   PROT 7.2 02/15/2020   ALBUMIN 4.5 02/15/2020   CALCIUM 9.8 02/15/2020   GFRAA 85 02/15/2020    Speciality Comments: No specialty comments available.  Procedures:  No procedures performed Allergies: Asa [aspirin], Augmentin [amoxicillin-pot clavulanate], Levaquin [levofloxacin in d5w], and Lovenox [enoxaparin sodium]   Assessment / Plan:     Visit Diagnoses: Positive ANA (antinuclear antibody) - ANA low titer, not significant.  ENA was negative.  Complements WNL.  Beta-2 IgA and anticardiolipin IgA were low titer positive. on plavix. no features of lupu  Abnormal LFTs - Chronic. hx of fatty liver  Chronic pain of both shoulders - Right rotator cuff tear in 2011  Chronic pain of both hips - Followed by pain managment   Chronic pain of both knees  DDD (degenerative disc disease), cervical - Moderate to severe spinal stenosis. Followed by pain management   Chronic midline low back pain with bilateral sciatica  Essential hypertension  Patent foramen ovale  Diabetes mellitus without complication (HCC)  History of gastroesophageal reflux (GERD)  Fatty liver  History of asthma  History of sleep apnea  Vitamin D deficiency  Orders: No orders of the defined types were placed in this encounter.  No orders of the defined types were placed in this encounter.   Face-to-face time  spent with patient was *** minutes. Greater than 50% of time was spent in counseling and coordination of care.  Follow-Up Instructions: No follow-ups on file.   Ofilia Neas, PA-C  Note - This record has been created using Dragon software.  Chart creation errors have been sought, but may not always  have been located. Such creation errors do not reflect on  the standard of medical care.

## 2020-04-30 DIAGNOSIS — I10 Essential (primary) hypertension: Secondary | ICD-10-CM | POA: Diagnosis not present

## 2020-04-30 DIAGNOSIS — E119 Type 2 diabetes mellitus without complications: Secondary | ICD-10-CM | POA: Diagnosis not present

## 2020-05-09 ENCOUNTER — Ambulatory Visit: Payer: Medicare Other | Admitting: Physician Assistant

## 2020-05-09 DIAGNOSIS — Q211 Atrial septal defect: Secondary | ICD-10-CM

## 2020-05-09 DIAGNOSIS — M5030 Other cervical disc degeneration, unspecified cervical region: Secondary | ICD-10-CM

## 2020-05-09 DIAGNOSIS — Z8669 Personal history of other diseases of the nervous system and sense organs: Secondary | ICD-10-CM

## 2020-05-09 DIAGNOSIS — R945 Abnormal results of liver function studies: Secondary | ICD-10-CM

## 2020-05-09 DIAGNOSIS — Z8719 Personal history of other diseases of the digestive system: Secondary | ICD-10-CM

## 2020-05-09 DIAGNOSIS — R768 Other specified abnormal immunological findings in serum: Secondary | ICD-10-CM

## 2020-05-09 DIAGNOSIS — E559 Vitamin D deficiency, unspecified: Secondary | ICD-10-CM

## 2020-05-09 DIAGNOSIS — Z8709 Personal history of other diseases of the respiratory system: Secondary | ICD-10-CM

## 2020-05-09 DIAGNOSIS — E119 Type 2 diabetes mellitus without complications: Secondary | ICD-10-CM

## 2020-05-09 DIAGNOSIS — K760 Fatty (change of) liver, not elsewhere classified: Secondary | ICD-10-CM

## 2020-05-09 DIAGNOSIS — G8929 Other chronic pain: Secondary | ICD-10-CM

## 2020-05-09 DIAGNOSIS — I10 Essential (primary) hypertension: Secondary | ICD-10-CM

## 2020-05-11 ENCOUNTER — Encounter: Payer: Self-pay | Admitting: Physician Assistant

## 2020-05-11 ENCOUNTER — Other Ambulatory Visit: Payer: Self-pay

## 2020-05-11 ENCOUNTER — Ambulatory Visit (INDEPENDENT_AMBULATORY_CARE_PROVIDER_SITE_OTHER): Payer: Medicare Other | Admitting: Physician Assistant

## 2020-05-11 VITALS — BP 148/82 | HR 89 | Resp 17 | Ht 63.0 in | Wt 238.0 lb

## 2020-05-11 DIAGNOSIS — I10 Essential (primary) hypertension: Secondary | ICD-10-CM | POA: Diagnosis not present

## 2020-05-11 DIAGNOSIS — R768 Other specified abnormal immunological findings in serum: Secondary | ICD-10-CM | POA: Diagnosis not present

## 2020-05-11 DIAGNOSIS — M25562 Pain in left knee: Secondary | ICD-10-CM

## 2020-05-11 DIAGNOSIS — M5441 Lumbago with sciatica, right side: Secondary | ICD-10-CM

## 2020-05-11 DIAGNOSIS — M25511 Pain in right shoulder: Secondary | ICD-10-CM | POA: Diagnosis not present

## 2020-05-11 DIAGNOSIS — E119 Type 2 diabetes mellitus without complications: Secondary | ICD-10-CM | POA: Diagnosis not present

## 2020-05-11 DIAGNOSIS — M25512 Pain in left shoulder: Secondary | ICD-10-CM

## 2020-05-11 DIAGNOSIS — Z8719 Personal history of other diseases of the digestive system: Secondary | ICD-10-CM | POA: Diagnosis not present

## 2020-05-11 DIAGNOSIS — Q2112 Patent foramen ovale: Secondary | ICD-10-CM

## 2020-05-11 DIAGNOSIS — M25551 Pain in right hip: Secondary | ICD-10-CM

## 2020-05-11 DIAGNOSIS — M25561 Pain in right knee: Secondary | ICD-10-CM

## 2020-05-11 DIAGNOSIS — M25552 Pain in left hip: Secondary | ICD-10-CM

## 2020-05-11 DIAGNOSIS — R945 Abnormal results of liver function studies: Secondary | ICD-10-CM | POA: Diagnosis not present

## 2020-05-11 DIAGNOSIS — R195 Other fecal abnormalities: Secondary | ICD-10-CM

## 2020-05-11 DIAGNOSIS — G8929 Other chronic pain: Secondary | ICD-10-CM

## 2020-05-11 DIAGNOSIS — M5030 Other cervical disc degeneration, unspecified cervical region: Secondary | ICD-10-CM | POA: Diagnosis not present

## 2020-05-11 DIAGNOSIS — E559 Vitamin D deficiency, unspecified: Secondary | ICD-10-CM

## 2020-05-11 DIAGNOSIS — M5442 Lumbago with sciatica, left side: Secondary | ICD-10-CM

## 2020-05-11 DIAGNOSIS — Q211 Atrial septal defect: Secondary | ICD-10-CM | POA: Diagnosis not present

## 2020-05-11 DIAGNOSIS — K760 Fatty (change of) liver, not elsewhere classified: Secondary | ICD-10-CM

## 2020-05-11 DIAGNOSIS — Z8709 Personal history of other diseases of the respiratory system: Secondary | ICD-10-CM

## 2020-05-11 DIAGNOSIS — R7989 Other specified abnormal findings of blood chemistry: Secondary | ICD-10-CM

## 2020-05-11 DIAGNOSIS — Z8669 Personal history of other diseases of the nervous system and sense organs: Secondary | ICD-10-CM

## 2020-05-11 NOTE — Progress Notes (Signed)
Office Visit Note  Patient: Wendy Kramer             Date of Birth: 05/25/1960           MRN: [id number]             PCP: Rosita Fire, MD Referring: Rosita Fire, MD Visit Date: 05/11/2020 Occupation: @GUAROCC @  Subjective:  Discussed lab work  History of Present Illness: Wendy Kramer is a 61 y.o. female with history of positive ANA and DDD.  She denies any new or worsening symptoms since her last office visit.  She continues to have chronic neck pain and is followed closely by pain management.  She takes Flexeril and tramadol as prescribed which manages her symptoms.  She also remains on vitamin D 2000 units daily which helps with her myalgias and arthralgias.  She denies any joint swelling at this time. She has not had any recent facial rashes, photosensitivity, oral nasal ulcerations, hair loss, increased fatigue, swollen lymph nodes, chest pain, palpitations, or shortness of breath.  She has chronic mouth dryness which is likely medication side effect.  She continues to take Plavix once weekly.    Activities of Daily Living:  Patient reports morning stiffness for 10  minutes.   Patient Reports nocturnal pain.  Difficulty dressing/grooming: Reports Difficulty climbing stairs: Reports Difficulty getting out of chair: Reports Difficulty using hands for taps, buttons, cutlery, and/or writing: Denies  Review of Systems  Constitutional: Positive for fatigue.  HENT: Positive for mouth dryness. Negative for mouth sores and nose dryness.   Eyes: Negative for pain, itching and dryness.  Respiratory: Negative for shortness of breath and difficulty breathing.   Cardiovascular: Negative for chest pain and palpitations.  Gastrointestinal: Negative for blood in stool, constipation and diarrhea.  Endocrine: Negative for increased urination.  Genitourinary: Negative for difficulty urinating.  Musculoskeletal: Positive for arthralgias, joint pain, myalgias, morning stiffness, muscle  tenderness and myalgias. Negative for joint swelling.  Skin: Negative for color change, rash and redness.  Allergic/Immunologic: Negative for susceptible to infections.  Neurological: Positive for dizziness, numbness, headaches and weakness. Negative for memory loss.  Hematological: Positive for bruising/bleeding tendency.  Psychiatric/Behavioral: Negative for confusion.    PMFS History:  Patient Active Problem List   Diagnosis Date Noted  . LUQ pain 11/26/2019  . Encounter for gynecological examination with Papanicolaou smear of cervix 10/06/2019  . Routine cervical smear 10/06/2019  . Encounter for screening fecal occult blood testing 10/06/2019  . Uncontrolled type 2 diabetes mellitus with hyperglycemia ([place]) 09/27/2019  . Mixed hyperlipidemia 09/27/2019  . Morbid obesity ([place]) 09/27/2019  . Abnormal LFTs 03/16/2019  . Diabetes mellitus without complication ([place])   . Positive colorectal cancer screening using Cologuard test 12/24/2017  . Abdominal pain, epigastric 12/24/2017  . GERD (gastroesophageal reflux disease) 12/24/2017  . Fatty liver 12/24/2017    Past Medical History:  Diagnosis Date  . Arthritis   . Cardiac murmur   . Cervical spine disease   . Diabetes mellitus without complication (HCC)    diet controlled  . Exogenous hyperlipidemia   . History of kidney stones   . HTN (hypertension)   . Morbid obesity due to excess calories ([place])   . Other chronic pain   . Panic disorder   . Patent foramen ovale    TEE 2018  . Prediabetes   . Sleep apnea   . Stroke [facility])    "mini-stroke" no deficits from this.  Marland Kitchen Unspecified asthma with (acute) exacerbation   .  Vitamin D deficiency     Family History  Problem Relation Age of Onset  . Breast cancer Maternal Aunt   . Colon cancer Other        maternal great grandfather  . Liver disease Brother        Fatty liver  . HIV Brother   . Hypertension Mother   . Hyperlipidemia Mother   . Hypertension Father   . Thyroid  disease Sister   . Pancreatic cancer Sister   . Healthy Daughter   . Healthy Son   . Healthy Son   . Heart disease Son    Past Surgical History:  Procedure Laterality Date  . BIOPSY  03/05/2018   Procedure: BIOPSY;  Surgeon: Daneil Dolin, MD;  [facility];  Service: Endoscopy;;  gastric  . CHOLECYSTECTOMY    . COLONOSCOPY WITH PROPOFOL N/A 03/05/2018   Dr. Gala Romney: Diverticulosis in the sigmoid and descending colon.  3 colon polyps removed, 2 hyperplastic and one sessile serrated polyp.  Next colonoscopy 3 years.  . ESOPHAGOGASTRODUODENOSCOPY (EGD) WITH PROPOFOL N/A 03/05/2018   Dr. Gala Romney: Erosive reflux esophagitis, small hiatal hernia, erosive gastropathy with reactive gastropathy on biopsy.  No H. pylori.  Marland Kitchen HERNIA REPAIR     umbilical  . POLYPECTOMY  03/05/2018   Procedure: POLYPECTOMY;  Surgeon: Daneil Dolin, MD;  Location: [facility];  Service: Endoscopy;;  colon  . SHOULDER ARTHROSCOPY Right   . TEE WITHOUT CARDIOVERSION  2018   patent foramen ovale  . TONSILLECTOMY AND ADENOIDECTOMY    . TUBAL LIGATION     Social History   Social History Narrative  . Not on file    There is no immunization history on file for this patient.   Objective: Vital Signs: BP (!) 148/82 (BP Location: Left Arm, Patient Position: Sitting, Cuff Size: Large)   Pulse 89   Resp 17   Ht 5\' 3"  (1.6 m)   Wt 238 lb (108 kg)   LMP 09/06/2016   BMI 42.16 kg/m    Physical Exam Vitals and nursing note reviewed.  Constitutional:      Appearance: She is well-developed and well-nourished.  HENT:     Head: Normocephalic and atraumatic.  Eyes:     Extraocular Movements: EOM normal.     Conjunctiva/sclera: Conjunctivae normal.  Cardiovascular:     Pulses: Intact distal pulses.  Pulmonary:     Effort: Pulmonary effort is normal.  Abdominal:     Palpations: Abdomen is soft.  Musculoskeletal:     Cervical back: Normal range of motion.  Skin:    General: Skin is warm and dry.      Capillary Refill: Capillary refill takes less than 2 seconds.  Neurological:     Mental Status: She is alert and oriented to person, place, and time.  Psychiatric:        Mood and Affect: Mood and affect normal.        Behavior: Behavior normal.      Musculoskeletal Exam: C-spine limited ROM with lateral rotation.  Postural thoracic kyphosis noted.  Shoulder joint abduction to about 90 degrees bilaterally.  Elbow joints, wrist joints, MCPs, PIPs, and DIPs good ROM with no synovitis.  Complete fist formation bilaterally.  Hip joints good ROM with some discomfort in the right hip.  Knee joints good ROM with no warmth or effusion.   CDAI Exam: CDAI Score: -- Patient Global: --; Provider Global: -- Swollen: --; Tender: -- Joint Exam 05/11/2020  No joint exam has been documented for this visit   There is currently no information documented on the homunculus. Go to the Rheumatology activity and complete the homunculus joint exam.  Investigation: No additional findings.  Imaging: No results found.  Recent Labs: Lab Results  Component Value Date   WBC 8.4 12/09/2019   HGB 14.4 12/09/2019   PLT 204 12/09/2019   NA 142 02/15/2020   K 4.9 02/15/2020   CL 103 02/15/2020   CO2 25 02/15/2020   GLUCOSE 162 (H) 02/15/2020   BUN 12 02/15/2020   CREATININE 0.86 02/15/2020   BILITOT 0.4 02/15/2020   ALKPHOS 103 02/15/2020   AST 70 (H) 02/15/2020   ALT 41 (H) 02/15/2020   PROT 7.2 02/15/2020   ALBUMIN 4.5 02/15/2020   CALCIUM 9.8 02/15/2020   GFRAA 85 02/15/2020    Speciality Comments: No specialty comments available.  Procedures:  No procedures performed Allergies: Asa [aspirin], Augmentin [amoxicillin-pot clavulanate], Levaquin [levofloxacin in d5w], and Lovenox [enoxaparin sodium]   Assessment / Plan:     Visit Diagnoses: Positive ANA (antinuclear antibody) - ANA is low titer and not significant.  ENA was negative.  Complements were normal.  Beta-2 IgA and anticardiolipin IgA  were low titer positive: She has no clinical features of systemic lupus.  She has not had any recent facial rashes, photosensitivity, hair loss, symptoms of Raynaud's, oral or nasal ulcerations, eye dryness, swollen lymph nodes, chest pain, palpitations, or shortness of breath.  No cervical lymphadenopathy was palpable on examination today.  No joint tenderness or synovitis was noted.  Lab work from 10/01/19 and 01/21/2020 were reviewed today in the office: Beta-2 glycoprotein 1 IgA and anticardiolipin IgA remain positive.  She recently reduce the dose of Plavix to once weekly due to increased bleeding and bruising.  She has been tolerating the reduced frequency of Plavix dosing.  We will repeat the following lab work today.  She was advised to notify us if she develops any new or worsening symptoms.  Possible symptoms to monitor for were discussed today in detail.  She will follow-up in the office in 6 months.- Plan: ANA, Beta-2 glycoprotein antibodies, Cardiolipin antibodies, IgG, IgM, IgA, C3 and C4, Anti-DNA antibody, double-stranded, Sedimentation rate  Abnormal LFTs: ALT 41 and AST 70 on 02/15/2020. She has chronic elevation of LFTs.  Chronic pain of both shoulders - Right rotator cuff tear 2011.  She has chronic pain in both shoulder joints.  She has limited abduction to about 90 degrees bilaterally.  Chronic pain of both hips - Followed by pain management.  She has good range of motion of both hip joints on exam with no discomfort.  She has not been experiencing any groin pain recently.  She takes tramadol 50 mg 1 tablet by mouth every 6 hours as needed for pain relief.  Chronic pain of both knees - Patient is followed by pain management.  She has good range of motion of both knee joints.  No warmth or effusion was noted.  DDD (degenerative disc disease), cervical - With moderate to severe spinal stenosis.  Chronic pain.  She has limited lateral rotation bilaterally.  She is not experiencing any  symptoms of radiculopathy at this time.  She continues to take tramadol 50 mg 1 tablet by mouth every 6 hours as needed for pain relief and Flexeril 5 mg by mouth daily as needed for muscle spasms.  She plans on continuing to follow-up closely with pain management.  Chronic midline low back  pain with bilateral sciatica: Chronic pain.  She has no midline spinal tenderness on exam.   Other medical conditions are listed as follows:  Diabetes mellitus without complication ([place])  Essential hypertension  Patent foramen ovale  Fatty liver  History of gastroesophageal reflux (GERD)  History of sleep apnea  History of asthma  Vitamin D deficiency  Positive colorectal cancer screening using Cologuard test  Orders: Orders Placed This Encounter  Procedures  . ANA  . Beta-2 glycoprotein antibodies  . Cardiolipin antibodies, IgG, IgM, IgA  . C3 and C4  . Anti-DNA antibody, double-stranded  . Sedimentation rate   No orders of the defined types were placed in this encounter.    Follow-Up Instructions: Return in about 6 months (around 11/08/2020) for Positive ANA, DDD.   Ofilia Neas, PA-C  Note - This record has been created using Dragon software.  Chart creation errors have been sought, but may not always  have been located. Such creation errors do not reflect on  the standard of medical care.

## 2020-05-12 NOTE — Progress Notes (Signed)
Beta-2 glycoprotein IgA and anticardiolipin IgA remain positive but lower titer.   ESR and complements WNL.  dsDNA is negative.

## 2020-05-15 LAB — CARDIOLIPIN ANTIBODIES, IGG, IGM, IGA
Anticardiolipin IgA: 33.3 APL-U/mL — ABNORMAL HIGH
Anticardiolipin IgG: <2 GPL-U/mL
Anticardiolipin IgM: 2.5 MPL-U/mL

## 2020-05-15 LAB — ANTI-DNA ANTIBODY, DOUBLE-STRANDED: ds DNA Ab: <1 IU/mL

## 2020-05-15 LAB — C3 AND C4
C3 Complement: 149 mg/dL (ref 83–193)
C4 Complement: 41 mg/dL (ref 15–57)

## 2020-05-15 LAB — BETA-2 GLYCOPROTEIN ANTIBODIES
Beta-2 Glyco 1 IgA: 38.7 U/mL — ABNORMAL HIGH
Beta-2 Glyco 1 IgM: 3 U/mL
Beta-2 Glyco I IgG: <2 U/mL

## 2020-05-15 LAB — SEDIMENTATION RATE: Sed Rate: 19 mm/h (ref 0–30)

## 2020-05-15 LAB — ANA: Anti Nuclear Antibody (ANA): NEGATIVE

## 2020-05-15 NOTE — Progress Notes (Signed)
ANA is negative.  No further recommendations at this time.

## 2020-05-23 ENCOUNTER — Encounter: Payer: Self-pay | Admitting: *Deleted

## 2020-05-23 ENCOUNTER — Other Ambulatory Visit: Payer: Self-pay

## 2020-05-23 ENCOUNTER — Encounter: Payer: Self-pay | Admitting: Gastroenterology

## 2020-05-23 ENCOUNTER — Encounter: Payer: Self-pay | Admitting: Internal Medicine

## 2020-05-23 ENCOUNTER — Ambulatory Visit (INDEPENDENT_AMBULATORY_CARE_PROVIDER_SITE_OTHER): Payer: Medicare Other | Admitting: Gastroenterology

## 2020-05-23 VITALS — BP 160/85 | HR 89 | Temp 97.3°F | Ht 63.0 in | Wt 235.4 lb

## 2020-05-23 DIAGNOSIS — K219 Gastro-esophageal reflux disease without esophagitis: Secondary | ICD-10-CM | POA: Diagnosis not present

## 2020-05-23 DIAGNOSIS — R7989 Other specified abnormal findings of blood chemistry: Secondary | ICD-10-CM

## 2020-05-23 DIAGNOSIS — K7460 Unspecified cirrhosis of liver: Secondary | ICD-10-CM | POA: Insufficient documentation

## 2020-05-23 DIAGNOSIS — R945 Abnormal results of liver function studies: Secondary | ICD-10-CM | POA: Diagnosis not present

## 2020-05-23 NOTE — Progress Notes (Signed)
Primary Care Physician: Rosita Fire, MD  Primary Gastroenterologist:  Garfield Cornea, MD   Chief Complaint  Patient presents with  . fatty liver  . Gastroesophageal Reflux    Doing ok    HPI: Wendy Kramer is a 60 y.o. female here for follow-up.  Last seen in August 2021.  History of GERD, fatty liver.    Prior CT imaging December 2020, showed hepatic steatosis with mildly nodular contour of the liver surface, borderline splenomegaly, findings concerning for cirrhosis with possible developing portal hypertension.  She also has a history of abnormal LFTs.  Updated ultrasound September 2021 showed nodular contours of the liver suggesting cirrhosis, spleen normal in size.  Labs in 12/2019, MELD Na of 6, AST 55/ALT 33. Previous labs with no evidence of iron overload, recommended vaccination for hepatitis A and B based on lack of immunity demonstrated on labs, hepatitis C antibody negative, AMA negative, immunoglobulins normal.  She was referred to rheumatology for positive ANA.  Colonoscopy December 2019, diverticulosis in the sigmoid colon and descending colon. 3 polyps removed, 2 hyperplastic and one sessile serrated polyp. Repeat colonoscopy in 3 years. EGD December 2019 with erosive reflux esophagitis, small hiatal hernia, erosive gastropathy. Pathology with reactive gastropathy. No H. Pylori.  Today: Since I last saw her she has lost 10 pounds.  States she got Covid back in December, mostly GI issues.  Lost significant weight around that time.  Since then try to eat more healthy. Eating more veagan.  Tries to be as active as possible but feels her physical limitations hinder her.  Continues to be concerned about AcipHex decreasing efficacy of her Plavix.  States her Plavix was decreased to every other day or at least couple times per week by her neurologist because of excessive bruising.  She wants to make sure when she takes Plavix she is getting full benefit.  As previously  outlined to her, the only PPIs without potential drug interaction with Plavix listed are lansoprazole, Dexilant, pantoprazole.  Currently patient's reflux is well controlled.  She denies abdominal pain.  Bowel movements are regular.  No blood in stool or melena.  Current Outpatient Medications  Medication Sig Dispense Refill  . acetaminophen (TYLENOL) 500 MG tablet Take 500 mg by mouth as needed.     Marland Kitchen albuterol (PROVENTIL HFA;VENTOLIN HFA) 108 (90 Base) MCG/ACT inhaler Inhale 1-2 puffs into the lungs as needed for wheezing or shortness of breath.     . ALPRAZolam (XANAX) 0.5 MG tablet Take 0.25 mg by mouth daily as needed for anxiety.     . blood glucose meter kit and supplies KIT 1 each by Other route daily. Dispense based on patient and insurance preference. Use once a day as directed. (FOR ICD-9 250.00, 250.01). 1 each 0  . Blood Glucose Monitoring Suppl (ACCU-CHEK GUIDE ME) w/Device KIT 1 Piece by Does not apply route as directed. 1 kit 0  . budesonide-formoterol (SYMBICORT) 80-4.5 MCG/ACT inhaler Inhale 1 puff into the lungs 2 (two) times daily.     . Cholecalciferol (VITAMIN D3) 50 MCG (2000 UT) TABS Take 2,000 Units by mouth daily.     . clopidogrel (PLAVIX) 75 MG tablet Take 1 tablet (75 mg total) by mouth daily. (Patient taking differently: Take 75 mg by mouth once a week.) 30 tablet 0  . cyclobenzaprine (FLEXERIL) 5 MG tablet Take 5 mg by mouth daily as needed for muscle spasms.   0  . fluticasone (FLONASE) 50 MCG/ACT nasal spray Place  1 spray into both nostrils daily as needed for allergies or rhinitis.     Marland Kitchen glucose blood (ACCU-CHEK GUIDE) test strip 1 each by Other route daily. Use 1 x a day to monitor BG 100 each 1  . hydrochlorothiazide (HYDRODIURIL) 12.5 MG tablet Take 12.5 mg by mouth 2 (two) times a week.    Marland Kitchen HYDROcodone-acetaminophen (NORCO/VICODIN) 5-325 MG tablet Take 1 tablet by mouth daily as needed.    Marland Kitchen lisinopril (ZESTRIL) 20 MG tablet Take 20 mg by mouth daily.    .  metFORMIN (GLUCOPHAGE) 1000 MG tablet Take 1,000 mg by mouth 2 (two) times daily.    . Multiple Vitamin (MULTIVITAMIN ADULT PO) Take 1 tablet by mouth daily.    . RABEprazole (ACIPHEX) 20 MG tablet TAKE 1 TABLET BY MOUTH EVERY DAY 90 tablet 3  . traMADol (ULTRAM) 50 MG tablet Take 50 mg by mouth every 6 (six) hours as needed for moderate pain.   3  . triamcinolone (KENALOG) 0.025 % cream Apply topically as needed.     No current facility-administered medications for this visit.    Allergies as of 05/23/2020 - Review Complete 05/23/2020  Allergen Reaction Noted  . Asa [aspirin] Anaphylaxis 09/24/2016  . Augmentin [amoxicillin-pot clavulanate] Other (See Comments) 09/24/2016  . Levaquin [levofloxacin in d5w] Itching and Other (See Comments) 09/24/2016  . Lovenox [enoxaparin sodium] Other (See Comments) 09/24/2016    ROS:  General: Negative for anorexia, unintentional weight loss, fever, chills, fatigue, weakness. ENT: Negative for hoarseness, difficulty swallowing , nasal congestion. CV: Negative for chest pain, angina, palpitations, dyspnea on exertion, peripheral edema.  Respiratory: Negative for dyspnea at rest, dyspnea on exertion, cough, sputum, wheezing.  GI: See history of present illness. GU:  Negative for dysuria, hematuria, urinary incontinence, urinary frequency, nocturnal urination.  Endo: Negative for unusual weight change.    Physical Examination:   BP (!) 160/85   Pulse 89   Temp (!) 97.3 F (36.3 C) (Temporal)   Ht '5\' 3"'  (1.6 m)   Wt 235 lb 6.4 oz (106.8 kg)   LMP 09/06/2016   BMI 41.70 kg/m   General: Well-nourished, well-developed in no acute distress.  Eyes: No icterus. Mouth: masked Lungs: Clear to auscultation bilaterally.  Heart: Regular rate and rhythm, no murmurs rubs or gallops.  Abdomen: Bowel sounds are normal, nontender, nondistended, no hepatosplenomegaly or masses, no abdominal bruits or hernia , no rebound or guarding.   Extremities: No lower  extremity edema. No clubbing or deformities. Neuro: Alert and oriented x 4   Skin: Warm and dry, no jaundice.   Psych: Alert and cooperative, normal mood and affect.  Labs:  Lab Results  Component Value Date   CREATININE 0.86 02/15/2020   BUN 12 02/15/2020   NA 142 02/15/2020   K 4.9 02/15/2020   CL 103 02/15/2020   CO2 25 02/15/2020   Lab Results  Component Value Date   ALT 41 (H) 02/15/2020   AST 70 (H) 02/15/2020   ALKPHOS 103 02/15/2020   BILITOT 0.4 02/15/2020   Lab Results  Component Value Date   WBC 8.4 12/09/2019   HGB 14.4 12/09/2019   HCT 43.4 12/09/2019   MCV 90.8 12/09/2019   PLT 204 12/09/2019   Lab Results  Component Value Date   INR 1.0 12/09/2019   INR 0.9 03/30/2019     Lab Results  Component Value Date   HGBA1C 8.0 (A) 02/09/2020    Imaging Studies: No results found.  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  GERD: Symptoms well controlled on AcipHex but she continues to be concerned about potential interaction with Plavix, decreasing efficacy of Plavix.  She only takes Plavix once or twice (due to excessive bruising and under the direction of her neurologist) a week and wants to optimize use.  The only PPIs not labeled as an issue are pantoprazole, lansoprazole, Dexilant.  At this time she would like to try moving back towards pantoprazole.  She is satisfied with how AcipHex is working but is concerned about interaction with Plavix.  She is not sure if pantoprazole previously completely controlled her symptoms.  She has medication left at home.  She will try to alternate every other day with pantoprazole and AcipHex with goal of eventually switching all the way to pantoprazole if reflux remains under control.  She will let me know how that goes and we can send in appropriate prescriptions.  Cirrhosis: Initially picked up on CT imaging with  mild nodular border in 2020.  Abdominal ultrasound last September with findings consistent with cirrhosis.  MELD Na has been 6.  She has had well compensated disease.  Likely Karlene Lineman as etiology.  Encouraged her to continue to gradually lose weight, maintain as much physical activity as possible.  Tight control of her sugars.  We will keep a check on her every 6 months with labs and ultrasound.  Consider screening for esophageal varices at time of upcoming colonoscopy later this year.  Adenomatous colon polyp: Due for surveillance colonoscopy December 2022.   Plan:  1. CBC, CMET, PT/INR, AFP tumor marker. 2. Abdominal ultrasound. 3. Continue weight loss efforts, goal of 10 pounds in the next 2 to 3 months. 4. Increase physical activity as tolerated. 5. Start alternating pantoprazole and AcipHex with goal of eventually switching over to pantoprazole 100% of the time if tolerated.  She will let me know how this goes, and we will send in appropriate prescriptions. 6. Return to the office in October 2022.  At that time we will schedule her for colonoscopy for history of adenomatous colon polyps.  We will also schedule her for an upper endoscopy for esophageal variceal screening.

## 2020-05-23 NOTE — Patient Instructions (Signed)
1. Labs and ultrasound as scheduled to follow-up on your liver. 2. Continue to slowly lose weight, try for 10 pounds over the next 2 to 3 months.  Stay as active as you can tolerate. 3. Try alternating reflux medications, AcipHex and pantoprazole with goal of completely switching over to pantoprazole if your reflux remains controlled.  Let me know if this works for you and we can send in appropriate prescriptions. 4. Return to the office in 8 months or call sooner if needed. At next office visit we can schedule your colonoscopy and upper endoscopy if needed.

## 2020-05-29 DIAGNOSIS — I10 Essential (primary) hypertension: Secondary | ICD-10-CM | POA: Diagnosis not present

## 2020-05-29 DIAGNOSIS — E119 Type 2 diabetes mellitus without complications: Secondary | ICD-10-CM | POA: Diagnosis not present

## 2020-05-31 ENCOUNTER — Ambulatory Visit (HOSPITAL_COMMUNITY): Payer: Medicare Other

## 2020-06-07 ENCOUNTER — Ambulatory Visit (HOSPITAL_COMMUNITY)
Admission: RE | Admit: 2020-06-07 | Discharge: 2020-06-07 | Disposition: A | Discharge status: Home / Self Care | Payer: Medicare Other | Source: Ambulatory Visit | Attending: Gastroenterology | Admitting: Gastroenterology

## 2020-06-07 DIAGNOSIS — K7460 Unspecified cirrhosis of liver: Secondary | ICD-10-CM | POA: Insufficient documentation

## 2020-06-07 DIAGNOSIS — R945 Abnormal results of liver function studies: Secondary | ICD-10-CM | POA: Insufficient documentation

## 2020-06-07 DIAGNOSIS — K219 Gastro-esophageal reflux disease without esophagitis: Secondary | ICD-10-CM | POA: Insufficient documentation

## 2020-06-08 LAB — PROTIME-INR
INR: 1
Prothrombin Time: 9.8 s (ref 9.0–11.5)

## 2020-06-08 LAB — CBC WITH DIFFERENTIAL/PLATELET
Absolute Monocytes: 489 cells/uL (ref 200–950)
Basophils Absolute: 51 cells/uL (ref 0–200)
Basophils Relative: 0.7 %
Eosinophils Absolute: 533 cells/uL — ABNORMAL HIGH (ref 15–500)
Eosinophils Relative: 7.3 %
HCT: 42.6 % (ref 35.0–45.0)
Hemoglobin: 14.2 g/dL (ref 11.7–15.5)
Lymphs Abs: 1694 cells/uL (ref 850–3900)
MCH: 30 pg (ref 27.0–33.0)
MCHC: 33.3 g/dL (ref 32.0–36.0)
MCV: 90.1 fL (ref 80.0–100.0)
MPV: 11.2 fL (ref 7.5–12.5)
Monocytes Relative: 6.7 %
Neutro Abs: 4533 cells/uL (ref 1500–7800)
Neutrophils Relative %: 62.1 %
Platelets: 194 10*3/uL (ref 140–400)
RBC: 4.73 10*6/uL (ref 3.80–5.10)
RDW: 13.1 % (ref 11.0–15.0)
Total Lymphocyte: 23.2 %
WBC: 7.3 10*3/uL (ref 3.8–10.8)

## 2020-06-08 LAB — COMPREHENSIVE METABOLIC PANEL
AG Ratio: 1.8 (calc) (ref 1.0–2.5)
ALT: 23 U/L (ref 6–29)
AST: 33 U/L (ref 10–35)
Albumin: 4.2 g/dL (ref 3.6–5.1)
Alkaline phosphatase (APISO): 79 U/L (ref 37–153)
BUN: 10 mg/dL (ref 7–25)
CO2: 27 mmol/L (ref 20–32)
Calcium: 9.5 mg/dL (ref 8.6–10.4)
Chloride: 105 mmol/L (ref 98–110)
Creat: 0.79 mg/dL (ref 0.50–0.99)
Globulin: 2.3 g/dL (calc) (ref 1.9–3.7)
Glucose, Bld: 126 mg/dL — ABNORMAL HIGH (ref 65–99)
Potassium: 4.5 mmol/L (ref 3.5–5.3)
Sodium: 143 mmol/L (ref 135–146)
Total Bilirubin: 0.6 mg/dL (ref 0.2–1.2)
Total Protein: 6.5 g/dL (ref 6.1–8.1)

## 2020-06-08 LAB — AFP TUMOR MARKER: AFP-Tumor Marker: 2.3 ng/mL

## 2020-06-19 ENCOUNTER — Encounter: Payer: Self-pay | Admitting: Nurse Practitioner

## 2020-06-19 ENCOUNTER — Telehealth: Payer: Self-pay | Admitting: Nurse Practitioner

## 2020-06-19 ENCOUNTER — Ambulatory Visit: Payer: Medicare Other | Admitting: Nurse Practitioner

## 2020-06-19 ENCOUNTER — Ambulatory Visit (INDEPENDENT_AMBULATORY_CARE_PROVIDER_SITE_OTHER): Payer: Medicare Other | Admitting: Nurse Practitioner

## 2020-06-19 ENCOUNTER — Other Ambulatory Visit: Payer: Self-pay

## 2020-06-19 VITALS — BP 157/83 | HR 82 | Ht 63.0 in | Wt 237.2 lb

## 2020-06-19 DIAGNOSIS — E782 Mixed hyperlipidemia: Secondary | ICD-10-CM

## 2020-06-19 DIAGNOSIS — E1165 Type 2 diabetes mellitus with hyperglycemia: Secondary | ICD-10-CM

## 2020-06-19 LAB — POCT GLYCOSYLATED HEMOGLOBIN (HGB A1C): HbA1c, POC (controlled diabetic range): 7.6 % — AB (ref 0.0–7.0)

## 2020-06-19 MED ORDER — ACCU-CHEK SOFTCLIX LANCETS MISC: 12 refills | Status: DC

## 2020-06-19 NOTE — Telephone Encounter (Signed)
Pt was just here and is needing her lancets called in  [place], [place]. [place] Phone:  [phone]  Fax:  [phone]

## 2020-06-19 NOTE — Telephone Encounter (Signed)
done

## 2020-06-19 NOTE — Patient Instructions (Signed)

## 2020-06-19 NOTE — Progress Notes (Signed)
06/19/2020, 8:30 AM   Endocrinology follow-up note  Subjective:    Patient ID: Wendy Kramer, female    DOB: 29-Aug-1960.  Wendy Kramer is being seen in follow-up after she was seen in consultation for management of currently uncontrolled symptomatic diabetes requested by  Rosita Fire, MD.   Past Medical History:  Diagnosis Date  . Arthritis   . Cardiac murmur   . Cervical spine disease   . Diabetes mellitus without complication (HCC)    diet controlled  . Exogenous hyperlipidemia   . History of kidney stones   . HTN (hypertension)   . Morbid obesity due to excess calories ([place])   . Other chronic pain   . Panic disorder   . Patent foramen ovale    TEE 2018  . Prediabetes   . Sleep apnea   . Stroke [facility])    "mini-stroke" no deficits from this.  Marland Kitchen Unspecified asthma with (acute) exacerbation   . Vitamin D deficiency     Past Surgical History:  Procedure Laterality Date  . BIOPSY  03/05/2018   Procedure: BIOPSY;  Surgeon: Daneil Dolin, MD;  Location: [facility];  Service: Endoscopy;;  gastric  . CHOLECYSTECTOMY    . COLONOSCOPY WITH PROPOFOL N/A 03/05/2018   Dr. Gala Romney: Diverticulosis in the sigmoid and descending colon.  3 colon polyps removed, 2 hyperplastic and one sessile serrated polyp.  Next colonoscopy 3 years.  . ESOPHAGOGASTRODUODENOSCOPY (EGD) WITH PROPOFOL N/A 03/05/2018   Dr. Gala Romney: Erosive reflux esophagitis, small hiatal hernia, erosive gastropathy with reactive gastropathy on biopsy.  No H. pylori.  Marland Kitchen HERNIA REPAIR     umbilical  . POLYPECTOMY  03/05/2018   Procedure: POLYPECTOMY;  Surgeon: Daneil Dolin, MD;  Location: [facility];  Service: Endoscopy;;  colon  . SHOULDER ARTHROSCOPY Right   . TEE WITHOUT CARDIOVERSION  2018   patent foramen ovale  . TONSILLECTOMY AND ADENOIDECTOMY    . TUBAL LIGATION      Social History   Socioeconomic History  . Marital  status: Legally Separated    Spouse name: Not on file  . Number of children: Not on file  . Years of education: Not on file  . Highest education level: Not on file  Occupational History  . Not on file  Tobacco Use  . Smoking status: Current Every Day Smoker    Packs/day: 0.50    Years: 40.00    Pack years: 20.00    Types: Cigarettes  . Smokeless tobacco: Never Used  Vaping Use  . Vaping Use: Never used  Substance and Sexual Activity  . Alcohol use: No  . Drug use: No  . Sexual activity: Not Currently    Birth control/protection: Post-menopausal, Surgical    Comment: tubal  Other Topics Concern  . Not on file  Social History Narrative  . Not on file   Social Determinants of Health   Financial Resource Strain: High Risk  . Difficulty of Paying Living Expenses: Hard  Food Insecurity: No Food Insecurity  . Worried About Charity fundraiser in the Last Year: Never true  .  Ran Out of Food in the Last Year: Never true  Transportation Needs: No Transportation Needs  . Lack of Transportation (Medical): No  . Lack of Transportation (Non-Medical): No  Physical Activity: Inactive  . Days of Exercise per Week: 0 days  . Minutes of Exercise per Session: 0 min  Stress: Stress Concern Present  . Feeling of Stress : To some extent  Social Connections: Socially Isolated  . Frequency of Communication with Friends and Family: More than three times a week  . Frequency of Social Gatherings with Friends and Family: More than three times a week  . Attends Religious Services: Never  . Active Member of Clubs or Organizations: No  . Attends Archivist Meetings: Never  . Marital Status: Separated    Family History  Problem Relation Age of Onset  . Breast cancer Maternal Aunt   . Colon cancer Other        maternal great grandfather  . Liver disease Brother        Fatty liver  . HIV Brother   . Hypertension Mother   . Hyperlipidemia Mother   . Hypertension Father   . Thyroid  disease Sister   . Pancreatic cancer Sister   . Healthy Daughter   . Healthy Son   . Healthy Son   . Heart disease Son     Outpatient Encounter Medications as of 06/19/2020  Medication Sig  . acetaminophen (TYLENOL) 500 MG tablet Take 500 mg by mouth as needed.   Marland Kitchen albuterol (PROVENTIL HFA;VENTOLIN HFA) 108 (90 Base) MCG/ACT inhaler Inhale 1-2 puffs into the lungs as needed for wheezing or shortness of breath.   . ALPRAZolam (XANAX) 0.5 MG tablet Take 0.25 mg by mouth daily as needed for anxiety.   . Blood Glucose Monitoring Suppl (ACCU-CHEK GUIDE ME) w/Device KIT 1 Piece by Does not apply route as directed.  . budesonide-formoterol (SYMBICORT) 80-4.5 MCG/ACT inhaler Inhale 1 puff into the lungs 2 (two) times daily.   . Cholecalciferol (VITAMIN D3) 50 MCG (2000 UT) TABS Take 2,000 Units by mouth daily.   . clopidogrel (PLAVIX) 75 MG tablet Take 1 tablet (75 mg total) by mouth daily. (Patient taking differently: Take 75 mg by mouth once a week.)  . cyclobenzaprine (FLEXERIL) 5 MG tablet Take 5 mg by mouth daily as needed for muscle spasms.   . fluticasone (FLONASE) 50 MCG/ACT nasal spray Place 1 spray into both nostrils daily as needed for allergies or rhinitis.   Marland Kitchen glucose blood (ACCU-CHEK GUIDE) test strip 1 each by Other route daily. Use 1 x a day to monitor BG  . hydrochlorothiazide (HYDRODIURIL) 12.5 MG tablet Take 12.5 mg by mouth 2 (two) times a week.  Marland Kitchen HYDROcodone-acetaminophen (NORCO/VICODIN) 5-325 MG tablet Take 1 tablet by mouth daily as needed.  Marland Kitchen lisinopril (ZESTRIL) 20 MG tablet Take 20 mg by mouth daily.  . metFORMIN (GLUCOPHAGE) 1000 MG tablet Take 1,000 mg by mouth 2 (two) times daily.  . Multiple Vitamin (MULTIVITAMIN ADULT PO) Take 1 tablet by mouth daily.  . RABEprazole (ACIPHEX) 20 MG tablet TAKE 1 TABLET BY MOUTH EVERY DAY  . traMADol (ULTRAM) 50 MG tablet Take 50 mg by mouth every 6 (six) hours as needed for moderate pain.   Marland Kitchen triamcinolone (KENALOG) 0.025 % cream  Apply topically as needed.  . [DISCONTINUED] blood glucose meter kit and supplies KIT 1 each by Other route daily. Dispense based on patient and insurance preference. Use once a day as directed. (FOR ICD-9  250.00, 250.01).  . [DISCONTINUED] ALPRAZolam (XANAX) 0.25 MG tablet Take 0.25 mg by mouth 2 (two) times daily as needed.   No facility-administered encounter medications on file as of 06/19/2020.    ALLERGIES: Allergies  Allergen Reactions  . Asa [Aspirin] Anaphylaxis  . Augmentin [Amoxicillin-Pot Clavulanate] Other (See Comments)    "Retching and pass out"  . Levaquin [Levofloxacin In D5w] Itching and Other (See Comments)    "body turned red"  . Lovenox [Enoxaparin Sodium] Other (See Comments)    phsyician instructed not to use ever again. "white veins popped out"    VACCINATION STATUS:  There is no immunization history on file for this patient.  Diabetes She presents for her follow-up diabetic visit. She has type 2 diabetes mellitus. Onset time: She was diagnosed at approximate age of 4 years, after several years of prediabetes. Her disease course has been stable. There are no hypoglycemic associated symptoms. Pertinent negatives for hypoglycemia include no confusion, headaches, pallor or seizures. Pertinent negatives for diabetes include no chest pain, no fatigue, no polydipsia, no polyphagia and no polyuria. There are no hypoglycemic complications. Symptoms are stable. Diabetic complications include a CVA. Risk factors for coronary artery disease include diabetes mellitus, dyslipidemia, family history, obesity, tobacco exposure, post-menopausal, sedentary lifestyle and hypertension. Current diabetic treatment includes oral agent (monotherapy). She is compliant with treatment most of the time. Her weight is stable. She is following a generally unhealthy diet. When asked about meal planning, she reported none. She has not had a previous visit with a dietitian. She never participates in  exercise. (She presents today with no meter or logs to review.  She does not routinely monitor glucose due to monotherapy of Metformin only.  Her POCT A1c today is 7.6%, improving from last visit of 8%.  She had COVID back in December and has lost some weight since then and kept it off.  She denies any s/s of hypoglycemia.) An ACE inhibitor/angiotensin II receptor blocker is being taken. She does not see a podiatrist.Eye exam is current.  Hyperlipidemia This is a chronic problem. The current episode started more than 1 year ago. Factors aggravating her hyperlipidemia include fatty foods. Pertinent negatives include no chest pain, myalgias or shortness of breath. She is currently on no antihyperlipidemic treatment. Compliance problems include adherence to diet and adherence to exercise.  Risk factors for coronary artery disease include diabetes mellitus, hypertension, obesity, post-menopausal and a sedentary lifestyle.  Hypertension This is a chronic problem. The current episode started more than 1 year ago. The problem has been waxing and waning since onset. The problem is uncontrolled. Pertinent negatives include no chest pain, headaches, palpitations or shortness of breath. There are no associated agents to hypertension. Risk factors for coronary artery disease include dyslipidemia, diabetes mellitus, obesity, smoking/tobacco exposure, sedentary lifestyle, post-menopausal state and family history. Past treatments include ACE inhibitors and diuretics. The current treatment provides moderate improvement. Compliance problems include diet and exercise.  Hypertensive end-organ damage includes CVA. Identifiable causes of hypertension include sleep apnea.    Review of systems  Constitutional: + Minimally fluctuating body weight,  current Body mass index is 42.02 kg/m. , no fatigue, no subjective hyperthermia, no subjective hypothermia Eyes: no blurry vision, no xerophthalmia ENT: no sore throat, no nodules  palpated in throat, no dysphagia/odynophagia, no hoarseness Cardiovascular: no chest pain, no shortness of breath, no palpitations, no leg swelling Respiratory: no cough, no shortness of breath Gastrointestinal: no nausea/vomiting/diarrhea Musculoskeletal: no muscle/joint aches Skin: no rashes, no hyperemia Neurological:  no tremors, no numbness, no tingling, no dizziness Psychiatric: no depression, no anxiety   Objective:    BP (!) 157/83 (BP Location: Left Arm, Patient Position: Sitting)   Pulse 82   Ht _0  (1.6 m)   Wt 237 lb 3.2 oz (107.6 kg)   LMP 09/06/2016   BMI 42.02 kg/m   Wt Readings from Last 3 Encounters:  06/19/20 237 lb 3.2 oz (107.6 kg)  05/23/20 235 lb 6.4 oz (106.8 kg)  05/11/20 238 lb (108 kg)   BP Readings from Last 3 Encounters:  06/19/20 (!) 157/83  05/23/20 (!) 160/85  05/11/20 (!) 148/82    Physical Exam- Limited  Constitutional:  Body mass index is 42.02 kg/m. , not in acute distress, normal state of mind Eyes:  EOMI, no exophthalmos Neck: Supple Cardiovascular: RRR, no murmers, rubs, or gallops, no edema Respiratory: Adequate breathing efforts, no crackles, rales, rhonchi, or wheezing Musculoskeletal: no gross deformities, strength intact in all four extremities, no gross restriction of joint movements Skin:  no rashes, no hyperemia Neurological: no tremor with outstretched hands    CMP ( most recent) CMP     Component Value Date/Time   NA 143 06/07/2020 0906   NA 142 02/15/2020 1018   K 4.5 06/07/2020 0906   CL 105 06/07/2020 0906   CO2 27 06/07/2020 0906   GLUCOSE 126 (H) 06/07/2020 0906   BUN 10 06/07/2020 0906   BUN 12 02/15/2020 1018   CREATININE 0.79 06/07/2020 0906   CALCIUM 9.5 06/07/2020 0906   PROT 6.5 06/07/2020 0906   PROT 7.2 02/15/2020 1018   ALBUMIN 4.5 02/15/2020 1018   AST 33 06/07/2020 0906   ALT 23 06/07/2020 0906   ALKPHOS 103 02/15/2020 1018   BILITOT 0.6 06/07/2020 0906   BILITOT 0.4 02/15/2020 1018    GFRNONAA 74 02/15/2020 1018   GFRAA 85 02/15/2020 1018     Diabetic Labs (most recent): Lab Results  Component Value Date   HGBA1C 8.0 (A) 02/09/2020   HGBA1C 7.5 (A) 11/02/2019   HGBA1C 9.2 07/23/2019     Lab Results  Component Value Date   TSH 1.85 07/23/2019      Assessment & Plan:   1) Uncontrolled type 2 diabetes mellitus with hyperglycemia (HCC)  - Wendy Kramer has currently uncontrolled symptomatic type 2 DM since  60 years of age.  She presents today with no meter or logs to review.  She does not routinely monitor glucose due to monotherapy of Metformin only.  Her POCT A1c today is 7.6%, improving from last visit of 8%.  She had COVID back in December and has lost some weight since then and kept it off.  She denies any s/s of hypoglycemia.  - I had a long discussion with her about the progressive nature of diabetes and the pathology behind its complications. -her diabetes is complicated by obesity/sedentary life, active smoking and she remains at a high risk for more acute and chronic complications which include CAD, CVA, CKD, retinopathy, and neuropathy. These are all discussed in detail with her.  - Nutritional counseling repeated at each appointment due to patients tendency to fall back in to old habits.  - The patient admits there is a room for improvement in their diet and drink choices. -  Suggestion is made for the patient to avoid simple carbohydrates from their diet including Cakes, Sweet Desserts / Pastries, Ice Cream, Soda (diet and regular), Sweet Tea, Candies, Chips, Cookies, Sweet Pastries,  Store Bought Juices,  Alcohol in Excess of  1-2 drinks a day, Artificial Sweeteners, Coffee Creamer, and "Sugar-free" Products. This will help patient to have stable blood glucose profile and potentially avoid unintended weight gain.   - I encouraged the patient to switch to  unprocessed or minimally processed complex starch and increased protein intake (animal or plant  source), fruits, and vegetables.   - Patient is advised to stick to a routine mealtimes to eat 3 meals  a day and avoid unnecessary snacks ( to snack only to correct hypoglycemia).  - she will be scheduled with Jearld Fenton, RDN, CDE for diabetes education.  - I have approached her with the following individualized plan to manage  her diabetes and patient agrees:   -Given her improved glycemic profile, she is advised to continue Metformin 1000 mg po twice daily with meals.  Will not add additional agent today.   - Specific targets for  A1c;  LDL, HDL,  and Triglycerides were discussed with the patient.  2) Blood Pressure /Hypertension:   Her blood pressure is still not controlled to target.  She does monitor her BP at home regularly.  She reports when the climate changes, her BP tends to run higher for about 1 week then returns to normal.  She is advised to continue Lisinopril 20 mg po daily and HCTZ 12.5 mg twice a week and follow up with her PCP for concerns.  3) Lipids/Hyperlipidemia:  There is no recent lipid panel to review.  She is not currently on any lipid lowering agents.  She reports she recently had her physical with her PCP who had lipids checked.  She will have PCP forward [place] her labs for record as well.  She is very sensitive to medications which may be why she is not on any statin medications.  4) Weight/Diet:  Her Body mass index is 42.02 kg/m.  -   clearly complicating her diabetes care.   she is  a candidate for weight loss. I discussed with her the fact that loss of 5 - 10% of her  current body weight will have the most impact on her diabetes management.  Exercise, and detailed carbohydrates information provided  -  detailed on discharge instructions.  5) Chronic Care/Health Maintenance: -she is on ACEI medications and is encouraged to initiate and continue to follow up with Ophthalmology, Dentist,  Podiatrist at least yearly or according to recommendations, and advised to   Quit smoking. I have recommended yearly flu vaccine and pneumonia vaccine at least every 5 years; moderate intensity exercise for up to 150 minutes weekly; and  sleep for at least 7 hours a day.  - she is advised to maintain close follow up with Rosita Fire, MD for primary care needs, as well as her other providers for optimal and coordinated care.  - Time spent on this patient care encounter:  40 min, of which > 50% was spent in  counseling and the rest reviewing her blood glucose logs , discussing her hypoglycemia and hyperglycemia episodes, reviewing her current and  previous labs / studies  ( including abstraction from other facilities) and medications  doses and developing a  long term treatment plan and documenting her care.   Please refer to Patient Instructions for Blood Glucose Monitoring and Insulin/Medications Dosing Guide"  in media tab for additional information. Please  also refer to " Patient Self Inventory" in the Media  tab for reviewed elements of pertinent patient history.  Wendy Kramer participated in the discussions,  expressed understanding, and voiced agreement with the above plans.  All questions were answered to her satisfaction. she is encouraged to contact clinic should she have any questions or concerns prior to her return visit.   Follow up plan: - Return in about 4 months (around 10/19/2020) for Diabetes follow up with A1c in office, No previsit labs.  Rayetta Pigg, [facility] [address] [place], [place] [address] Phone: [phone] Fax: [phone]  06/19/2020, 8:30 AM

## 2020-06-19 NOTE — Addendum Note (Signed)
Addended by: Brita Romp on: 06/19/2020 09:24 AM   Modules accepted: Orders

## 2020-06-26 DIAGNOSIS — I10 Essential (primary) hypertension: Secondary | ICD-10-CM | POA: Diagnosis not present

## 2020-06-26 DIAGNOSIS — E119 Type 2 diabetes mellitus without complications: Secondary | ICD-10-CM | POA: Diagnosis not present

## 2020-07-04 ENCOUNTER — Other Ambulatory Visit: Payer: Self-pay | Admitting: Neurology

## 2020-07-04 DIAGNOSIS — M79601 Pain in right arm: Secondary | ICD-10-CM | POA: Diagnosis not present

## 2020-07-04 DIAGNOSIS — I6990 Unspecified sequelae of unspecified cerebrovascular disease: Secondary | ICD-10-CM | POA: Diagnosis not present

## 2020-07-04 DIAGNOSIS — I679 Cerebrovascular disease, unspecified: Secondary | ICD-10-CM | POA: Diagnosis not present

## 2020-07-04 DIAGNOSIS — M5412 Radiculopathy, cervical region: Secondary | ICD-10-CM | POA: Diagnosis not present

## 2020-07-04 DIAGNOSIS — R252 Cramp and spasm: Secondary | ICD-10-CM | POA: Diagnosis not present

## 2020-07-04 DIAGNOSIS — M545 Low back pain, unspecified: Secondary | ICD-10-CM | POA: Diagnosis not present

## 2020-07-04 DIAGNOSIS — M542 Cervicalgia: Secondary | ICD-10-CM | POA: Diagnosis not present

## 2020-07-04 DIAGNOSIS — I10 Essential (primary) hypertension: Secondary | ICD-10-CM | POA: Diagnosis not present

## 2020-07-04 DIAGNOSIS — F419 Anxiety disorder, unspecified: Secondary | ICD-10-CM | POA: Diagnosis not present

## 2020-07-27 DIAGNOSIS — E119 Type 2 diabetes mellitus without complications: Secondary | ICD-10-CM | POA: Diagnosis not present

## 2020-07-27 DIAGNOSIS — F17200 Nicotine dependence, unspecified, uncomplicated: Secondary | ICD-10-CM | POA: Diagnosis not present

## 2020-07-28 ENCOUNTER — Telehealth: Payer: Self-pay

## 2020-07-28 NOTE — Telephone Encounter (Signed)
Refill request received from [facility] for Rabeprazole 20 mg daily, #90 tablets

## 2020-07-29 ENCOUNTER — Other Ambulatory Visit: Payer: Medicare Other

## 2020-07-29 NOTE — Telephone Encounter (Signed)
We need to confirm patient needs this refilled. Per OV with Neil Crouch, PA in February, they were discussing switching over to Pantoprazole due to concerns for interaction between Plavix and Aciphex. Also routing to [place] for additional input.

## 2020-07-31 MED ORDER — PANTOPRAZOLE SODIUM 40 MG PO TBEC: 40.0000 mg | DELAYED_RELEASE_TABLET | Freq: Every day | ORAL | 3 refills | Status: DC

## 2020-07-31 NOTE — Telephone Encounter (Signed)
Noted. Spoke with pt. Pt was notified that Pantoprazole was sent into her pharmacy. Pt is aware that she is to take medication once daily and d/c Aciphex. If Pantoprazole isn't controlling symptoms, pt is to call back.

## 2020-07-31 NOTE — Telephone Encounter (Signed)
I have sent in RX for pantoprazole once daily. Patient should stop aciphex and try pantoprazole. If pantoprazole does not control symptoms she can let us know.

## 2020-07-31 NOTE — Addendum Note (Signed)
Addended by: Mahala Menghini on: 07/31/2020 01:22 PM   Modules accepted: Orders

## 2020-07-31 NOTE — Telephone Encounter (Signed)
Spoke with pt. She originally sent in the request for Rabeprazole 20 mg because she had tried the Pantoprazole before and didn't feel it worked as well. Pt states she is will to try the Pantoprazole again to see if it will work this time. Pt would only like a 1 month supply of Pantoprazole to see if it works this month.

## 2020-08-08 NOTE — Telephone Encounter (Signed)
Pt called in and said that Pantoprazole is not working.  She said that she is having side effects:  Headache and cough. Only helped for 3 or 4 hours and then heartburn came back.  D/C Pantoprazole x 2 days ago.  Pt said that she tried it for a week.  Wants to know if she can start back on Rabeprazole.  [place]

## 2020-08-09 ENCOUNTER — Telehealth: Payer: Self-pay | Admitting: Internal Medicine

## 2020-08-09 MED ORDER — RABEPRAZOLE SODIUM 20 MG PO TBEC: 20.0000 mg | DELAYED_RELEASE_TABLET | Freq: Every day | ORAL | 3 refills | Status: DC

## 2020-08-09 NOTE — Telephone Encounter (Signed)
Pt said that PA is required for medication.

## 2020-08-09 NOTE — Telephone Encounter (Signed)
RX sent for rabeprazole. If her GERD symptoms do not settle down, she will need a follow up. Otherwise we will see her in 12/2020.

## 2020-08-09 NOTE — Telephone Encounter (Signed)
Pt was calling back to speak with nurse. [phone]

## 2020-08-09 NOTE — Telephone Encounter (Signed)
Pt called in to follow up on phone note today.  Said she is having terrible acid reflux.  Wants to know if there is any way we could send in RX for Rabeprazole today.

## 2020-08-09 NOTE — Telephone Encounter (Signed)
Angie handled

## 2020-08-09 NOTE — Addendum Note (Signed)
Addended by: Mahala Menghini on: 08/09/2020 01:00 PM   Modules accepted: Orders

## 2020-08-09 NOTE — Telephone Encounter (Addendum)
Pt made aware that RX has been sent.  Pt informed that if symptoms do not settle down, she will need a follow up before her scheduled appointment in October. Pt voiced understanding.

## 2020-08-10 ENCOUNTER — Telehealth: Payer: Self-pay

## 2020-08-10 NOTE — Telephone Encounter (Signed)
Pt called inquiring of her PA for Rx Rabeprazole 20 mg. I advised the pt getting medications approved can take upwards to a day or weeks. Reassured the pt that once it has been approved I will call her and her pharmacy.

## 2020-08-15 ENCOUNTER — Ambulatory Visit
Admission: RE | Admit: 2020-08-15 | Discharge: 2020-08-15 | Disposition: A | Discharge status: Home / Self Care | Payer: Medicare Other | Source: Ambulatory Visit | Attending: Neurology | Admitting: Neurology

## 2020-08-15 ENCOUNTER — Other Ambulatory Visit: Payer: Self-pay

## 2020-08-15 DIAGNOSIS — M4802 Spinal stenosis, cervical region: Secondary | ICD-10-CM | POA: Diagnosis not present

## 2020-08-15 DIAGNOSIS — M5412 Radiculopathy, cervical region: Secondary | ICD-10-CM

## 2020-08-16 DIAGNOSIS — F419 Anxiety disorder, unspecified: Secondary | ICD-10-CM | POA: Diagnosis not present

## 2020-08-16 DIAGNOSIS — M545 Low back pain, unspecified: Secondary | ICD-10-CM | POA: Diagnosis not present

## 2020-08-16 DIAGNOSIS — I679 Cerebrovascular disease, unspecified: Secondary | ICD-10-CM | POA: Diagnosis not present

## 2020-08-16 DIAGNOSIS — M79601 Pain in right arm: Secondary | ICD-10-CM | POA: Diagnosis not present

## 2020-08-16 DIAGNOSIS — I10 Essential (primary) hypertension: Secondary | ICD-10-CM | POA: Diagnosis not present

## 2020-08-16 DIAGNOSIS — R252 Cramp and spasm: Secondary | ICD-10-CM | POA: Diagnosis not present

## 2020-08-16 DIAGNOSIS — M542 Cervicalgia: Secondary | ICD-10-CM | POA: Diagnosis not present

## 2020-08-16 DIAGNOSIS — I6990 Unspecified sequelae of unspecified cerebrovascular disease: Secondary | ICD-10-CM | POA: Diagnosis not present

## 2020-08-16 DIAGNOSIS — M5412 Radiculopathy, cervical region: Secondary | ICD-10-CM | POA: Diagnosis not present

## 2020-08-18 ENCOUNTER — Other Ambulatory Visit: Payer: Self-pay | Admitting: Neurology

## 2020-08-18 DIAGNOSIS — M4802 Spinal stenosis, cervical region: Secondary | ICD-10-CM

## 2020-08-26 DIAGNOSIS — E119 Type 2 diabetes mellitus without complications: Secondary | ICD-10-CM | POA: Diagnosis not present

## 2020-08-26 DIAGNOSIS — I10 Essential (primary) hypertension: Secondary | ICD-10-CM | POA: Diagnosis not present

## 2020-08-30 NOTE — Telephone Encounter (Signed)
PA denied per Dena.  She is working on appeal for pt.

## 2020-08-31 ENCOUNTER — Inpatient Hospital Stay: Admission: RE | Admit: 2020-08-31 | Payer: Medicare Other | Source: Ambulatory Visit

## 2020-09-04 ENCOUNTER — Telehealth: Payer: Self-pay | Admitting: Internal Medicine

## 2020-09-04 NOTE — Telephone Encounter (Signed)
Pt called asking to speak with the nurse. Regarding prescription please call [phone]

## 2020-09-14 ENCOUNTER — Other Ambulatory Visit (HOSPITAL_COMMUNITY): Payer: Self-pay | Admitting: Internal Medicine

## 2020-09-14 DIAGNOSIS — Z1389 Encounter for screening for other disorder: Secondary | ICD-10-CM | POA: Diagnosis not present

## 2020-09-14 DIAGNOSIS — I10 Essential (primary) hypertension: Secondary | ICD-10-CM | POA: Diagnosis not present

## 2020-09-14 DIAGNOSIS — I700 Atherosclerosis of aorta: Secondary | ICD-10-CM | POA: Diagnosis not present

## 2020-09-14 DIAGNOSIS — Z1231 Encounter for screening mammogram for malignant neoplasm of breast: Secondary | ICD-10-CM

## 2020-09-14 DIAGNOSIS — F17210 Nicotine dependence, cigarettes, uncomplicated: Secondary | ICD-10-CM | POA: Diagnosis not present

## 2020-09-14 DIAGNOSIS — E119 Type 2 diabetes mellitus without complications: Secondary | ICD-10-CM | POA: Diagnosis not present

## 2020-09-14 DIAGNOSIS — Z1331 Encounter for screening for depression: Secondary | ICD-10-CM | POA: Diagnosis not present

## 2020-09-20 ENCOUNTER — Other Ambulatory Visit: Payer: Self-pay | Admitting: "Endocrinology

## 2020-09-20 DIAGNOSIS — E1165 Type 2 diabetes mellitus with hyperglycemia: Secondary | ICD-10-CM

## 2020-09-25 ENCOUNTER — Ambulatory Visit (HOSPITAL_COMMUNITY): Payer: Medicare Other

## 2020-10-03 NOTE — Telephone Encounter (Signed)
error 

## 2020-10-05 ENCOUNTER — Ambulatory Visit (HOSPITAL_COMMUNITY)
Admission: RE | Admit: 2020-10-05 | Discharge: 2020-10-05 | Disposition: A | Discharge status: Home / Self Care | Payer: Medicare Other | Source: Ambulatory Visit | Attending: Internal Medicine | Admitting: Internal Medicine

## 2020-10-05 DIAGNOSIS — I10 Essential (primary) hypertension: Secondary | ICD-10-CM | POA: Diagnosis not present

## 2020-10-05 DIAGNOSIS — E559 Vitamin D deficiency, unspecified: Secondary | ICD-10-CM | POA: Diagnosis not present

## 2020-10-05 DIAGNOSIS — E119 Type 2 diabetes mellitus without complications: Secondary | ICD-10-CM | POA: Diagnosis not present

## 2020-10-05 DIAGNOSIS — E039 Hypothyroidism, unspecified: Secondary | ICD-10-CM | POA: Diagnosis not present

## 2020-10-05 DIAGNOSIS — Z1231 Encounter for screening mammogram for malignant neoplasm of breast: Secondary | ICD-10-CM | POA: Insufficient documentation

## 2020-10-11 ENCOUNTER — Telehealth: Payer: Self-pay | Admitting: Internal Medicine

## 2020-10-11 DIAGNOSIS — R252 Cramp and spasm: Secondary | ICD-10-CM | POA: Diagnosis not present

## 2020-10-11 DIAGNOSIS — I6990 Unspecified sequelae of unspecified cerebrovascular disease: Secondary | ICD-10-CM | POA: Diagnosis not present

## 2020-10-11 DIAGNOSIS — I679 Cerebrovascular disease, unspecified: Secondary | ICD-10-CM | POA: Diagnosis not present

## 2020-10-11 DIAGNOSIS — M5412 Radiculopathy, cervical region: Secondary | ICD-10-CM | POA: Diagnosis not present

## 2020-10-11 DIAGNOSIS — M545 Low back pain, unspecified: Secondary | ICD-10-CM | POA: Diagnosis not present

## 2020-10-11 DIAGNOSIS — M79601 Pain in right arm: Secondary | ICD-10-CM | POA: Diagnosis not present

## 2020-10-11 DIAGNOSIS — F419 Anxiety disorder, unspecified: Secondary | ICD-10-CM | POA: Diagnosis not present

## 2020-10-11 DIAGNOSIS — M542 Cervicalgia: Secondary | ICD-10-CM | POA: Diagnosis not present

## 2020-10-11 DIAGNOSIS — I10 Essential (primary) hypertension: Secondary | ICD-10-CM | POA: Diagnosis not present

## 2020-10-11 NOTE — Telephone Encounter (Signed)
Pt called asking to speak with [facility] nurse. I transferred call to VM.

## 2020-10-12 ENCOUNTER — Telehealth: Payer: Self-pay | Admitting: Internal Medicine

## 2020-10-12 NOTE — Telephone Encounter (Signed)
Patient called again wanting to speak to Lena

## 2020-10-12 NOTE — Telephone Encounter (Signed)
See other phone note and pt also LMOVM for me to call her tomorrow for an appt

## 2020-10-13 MED ORDER — FAMOTIDINE 20 MG PO TABS: ORAL_TABLET | ORAL | 2 refills | Status: DC

## 2020-10-13 NOTE — Telephone Encounter (Signed)
I am going to send in Pepcid to take 20mg  at lunch and 20mg  at bedtime.  She take either Nexium or omeprazole once daily, she can alternate if she wants but don't take same day.   Open up spot for me to see her 1:30 on 7/20.

## 2020-10-13 NOTE — Telephone Encounter (Signed)
Noted  

## 2020-10-13 NOTE — Telephone Encounter (Signed)
Wendy Kramer just advised me that it has to be Wendy Kramer who opens that spot for you because they don't allow front staff to do that. So Wendy Kramer is not here today and Wendy Kramer is leaving a note for her to open that spot for you. Phoned the pt and LMOVM regarding her medication and instructions and appt on 07/20 @ 1:30 pm

## 2020-10-13 NOTE — Telephone Encounter (Signed)
Only other PPI choices are Dexilant. Or we could switch her to Pepcid for now.   Please find out the following:  Is she taking anything for her reflux right now.  Is she having typical reflux symptoms, like heartburn? Would she like to try Dexilant or Pepcid? Needs OV or virtual visit. I can add her to Wednesday at 1:30 if she would like.

## 2020-10-13 NOTE — Telephone Encounter (Signed)
Phoned and spoke with the pt, the Dexilant is the one her insurance will not cover, she advised me for 2 or 3 days she takes Omeprazole and when side effects from that start to show she takes Nexium for the next 2 or 3 days. I advised the pt that this isn't a good idea to do that and to just do one or the other until the Dr is able to evaluate her or send in something. She states she has really bad reflux and heartburn to the point where it wakes her up in the middle of the night. Pt would like to come in Wednesday at 1:30 pm to see you.

## 2020-10-13 NOTE — Addendum Note (Signed)
Addended by: Mahala Menghini on: 10/13/2020 11:32 AM   Modules accepted: Orders

## 2020-10-13 NOTE — Telephone Encounter (Signed)
I returned the pt's call this call this morning where she had LMOVM as well. This is a pt that for months her insurance has denied her Raberprazole. I advised the pt even when I did another PA it was denied due to the fact that this pt had expressed her concerns of Raberprazole interacting with her Plavix. So when that was noted and sent to Cover My med's (because they asked for her notes) it denied it. Now the pt is stating that her reflux has been so bad that it interfere's with here breathing because of her asthma. States she doesn't want prednisone because it makes reflux worse. She has been on several different medications and she states they all give her headaches (Nexium, Protonix, omeprazole, prevacid). She wanted a appt to be seen sooner but [name] advised that all she could do was to put her on a cancellation list. Pt is wanting some relief from reflux. Please advise.

## 2020-10-14 DIAGNOSIS — E119 Type 2 diabetes mellitus without complications: Secondary | ICD-10-CM | POA: Diagnosis not present

## 2020-10-14 DIAGNOSIS — I10 Essential (primary) hypertension: Secondary | ICD-10-CM | POA: Diagnosis not present

## 2020-10-17 NOTE — Progress Notes (Signed)
Primary Care Physician: Rosita Fire, MD  Primary Gastroenterologist:  Garfield Cornea, MD   Chief Complaint  Patient presents with   Gastroesophageal Reflux    C/o lots of burning, affecting asthma now. On pepcid BID but not helping. Aciphex not covered by insurance    HPI: Wendy Kramer is a 60 y.o. female here for follow up. Last seen in 05/2020. History of GERD, fatty liver with mildly nodular contour liver surface/borderline splenomegaly concerning for cirrhosis. 05/2020 MELD Na 6. Mildly elevated AST/ALT, positive ANA (referred to hematology) with normal immunoglobulins. Suspected NASH. Hepatoma screening/labs due 11/2020.   Colonoscopy December 2019, diverticulosis in the sigmoid colon and descending colon.  3 polyps removed, 2 hyperplastic and one sessile serrated polyp.  Repeat colonoscopy in 3 years.  EGD December 2019 with erosive reflux esophagitis, small hiatal hernia, erosive gastropathy.  Pathology with reactive gastropathy.  No H. Pylori.  Previously on aciphex for gerd. She has been concerned about decreasing efficacy of her plavix. Plavix also decreased to every other day or even as little as twice per week by her neurologist due to excessive bruising. Tried to switch her to lansoprazole, dexilant or pantoprazole due to this concern as these PPIs do not have this potential issue listed.  Patient tried pantoprazole within 30 minutes of taking, developed pressure in her ears and headache.  She had similar reaction with omeprazole and Nexium in the past.  She tried to go back on generic Aciphex but insurance would not pay for it.  She has never tried Building [employer].  Previously not covered by her insurance.  Looks like generic may be.  Is interested in giving this a try.  Currently Pepcid 20 mg twice daily not touching her symptoms.  Heartburn is horrible.  Burning keeps her up at night or wakes her up from sleep.  Consuming bland diet.  Having more issues with cough/asthma, states  it was much better on rabeprazole. BM regular now, had developed some constipation.   Current Outpatient Medications  Medication Sig Dispense Refill   ACCU-CHEK GUIDE test strip USE TO CHECK BLOOD SUGAR DAILY 100 strip 1   Accu-Chek Softclix Lancets lancets Use as instructed to monitor glucose 1-2 times daily. 100 each 12   acetaminophen (TYLENOL) 500 MG tablet Take 500 mg by mouth as needed.      albuterol (PROVENTIL HFA;VENTOLIN HFA) 108 (90 Base) MCG/ACT inhaler Inhale 1-2 puffs into the lungs as needed for wheezing or shortness of breath.      ALPRAZolam (XANAX) 0.5 MG tablet Take 0.25 mg by mouth daily as needed for anxiety.      Blood Glucose Monitoring Suppl (ACCU-CHEK GUIDE ME) w/Device KIT 1 Piece by Does not apply route as directed. 1 kit 0   budesonide-formoterol (SYMBICORT) 80-4.5 MCG/ACT inhaler Inhale 1 puff into the lungs 2 (two) times daily.      clopidogrel (PLAVIX) 75 MG tablet Take 1 tablet (75 mg total) by mouth daily. (Patient taking differently: Take 75 mg by mouth once a week.) 30 tablet 0   cyclobenzaprine (FLEXERIL) 5 MG tablet Take 5 mg by mouth daily as needed for muscle spasms.   0   famotidine (PEPCID) 20 MG tablet Take one at lunch and one at bedtime. 60 tablet 2   fluticasone (FLONASE) 50 MCG/ACT nasal spray Place 1 spray into both nostrils daily as needed for allergies or rhinitis.      hydrochlorothiazide (HYDRODIURIL) 12.5 MG tablet Take 12.5 mg by mouth 2 (two)  times a week.     HYDROcodone-acetaminophen (NORCO/VICODIN) 5-325 MG tablet Take 1 tablet by mouth daily as needed.     lisinopril (ZESTRIL) 20 MG tablet Take 20 mg by mouth daily.     metFORMIN (GLUCOPHAGE) 1000 MG tablet Take 1,000 mg by mouth 2 (two) times daily.     Multiple Vitamin (MULTIVITAMIN ADULT PO) Take 1 tablet by mouth daily.     traMADol (ULTRAM) 50 MG tablet Take 50 mg by mouth every 6 (six) hours as needed for moderate pain.   3   triamcinolone (KENALOG) 0.025 % cream Apply topically as  needed.            No current facility-administered medications for this visit.    Allergies as of 10/18/2020 - Review Complete 10/18/2020  Allergen Reaction Noted   Asa [aspirin] Anaphylaxis 09/24/2016   Augmentin [amoxicillin-pot clavulanate] Other (See Comments) 09/24/2016   Levaquin [levofloxacin in d5w] Itching and Other (See Comments) 09/24/2016   Lovenox [enoxaparin sodium] Other (See Comments) 09/24/2016    ROS:  General: Negative for anorexia, weight loss, fever, chills, fatigue, weakness. ENT: Negative for hoarseness, difficulty swallowing , nasal congestion. CV: Negative for chest pain, angina, palpitations, dyspnea on exertion, peripheral edema.  Respiratory: Negative for dyspnea at rest, dyspnea on exertion, cough, sputum, wheezing.  GI: See history of present illness. GU:  Negative for dysuria, hematuria, urinary incontinence, urinary frequency, nocturnal urination.  Endo: Negative for unusual weight change.    Physical Examination:   BP 139/74   Pulse 88   Temp 97.9 F (36.6 C)   Ht '5\' 3"'  (1.6 m)   Wt 235 lb 12.8 oz (107 kg)   LMP 09/06/2016   BMI 41.77 kg/m   General: Well-nourished, well-developed in no acute distress.  Eyes: No icterus. Mouth: masked.  Abdomen: Bowel sounds are normal, nontender, nondistended, no hepatosplenomegaly or masses, no abdominal bruits or hernia , no rebound or guarding.   Extremities: No lower extremity edema. No clubbing or deformities. Neuro: Alert and oriented x 4   Skin: Warm and dry, no jaundice.   Psych: Alert and cooperative, normal mood and affect.  Labs:  Lab Results  Component Value Date   WBC 7.3 06/07/2020   HGB 14.2 06/07/2020   HCT 42.6 06/07/2020   MCV 90.1 06/07/2020   PLT 194 06/07/2020   Lab Results  Component Value Date   CREATININE 0.79 06/07/2020   BUN 10 06/07/2020   NA 143 06/07/2020   K 4.5 06/07/2020   CL 105 06/07/2020   CO2 27 06/07/2020   Lab Results  Component Value Date   ALT  23 06/07/2020   AST 33 06/07/2020   ALKPHOS 103 02/15/2020   BILITOT 0.6 06/07/2020   Lab Results  Component Value Date   INR 1.0 06/07/2020   INR 1.0 12/09/2019   INR 0.9 03/30/2019     Imaging Studies: MM 3D SCREEN BREAST BILATERAL  Result Date: 10/09/2020 CLINICAL DATA:  Screening. EXAM: DIGITAL SCREENING BILATERAL MAMMOGRAM WITH TOMOSYNTHESIS AND CAD TECHNIQUE: Bilateral screening digital craniocaudal and mediolateral oblique mammograms were obtained. Bilateral screening digital breast tomosynthesis was performed. The images were evaluated with computer-aided detection. COMPARISON:  Previous exam(s). ACR Breast Density Category b: There are scattered areas of fibroglandular density. FINDINGS: There are no findings suspicious for malignancy. IMPRESSION: No mammographic evidence of malignancy. A result letter of this screening mammogram will be mailed directly to the patient. RECOMMENDATION: Screening mammogram in one year. (Code:SM-B-01Y) BI-RADS CATEGORY  1: Negative.  Electronically Signed   By: Franki Cabot M.D.   On: 10/09/2020 08:26     Assessment:  GERD: Having terrible breakthrough symptoms.  Did much better on rabeprazole but insurance no longer covering.  Has intolerable side effects to pantoprazole, omeprazole, Nexium.  Dexlansoprazole may be on her formulary.  Start 60 mg daily before breakfast.  Can continue Pepcid 20 mg twice daily as needed for over-the-counter antacids.  She will keep me posted, if continued refractory symptoms, may need endoscopy.  Reinforced antireflux measures.  Cirrhosis: Has been well compensated.  Due for labs and ultrasound in September.  Consider screening for esophageal varices at time of upcoming colonoscopy later this year.  Adenomatous colon polyps: Due for surveillance colonoscopy in December 2022.  We will have her come back for an office visit in November.   Plan: Labs in September to include CBC, CMET, PT/INR, AFP tumor marker.  Right  upper quadrant ultrasound in September. Trial of dexlansoprazole 60 mg daily before breakfast. May continue to use Pepcid 20 mg twice daily as needed or other antacids. She will keep me posted if her symptoms do not settle down. Return to the office in November 2022, at that time we will schedule colonoscopy and discussed possibility of upper endoscopy based on any refractory reflux symptoms and potentially to screen for esophageal varices.

## 2020-10-18 ENCOUNTER — Other Ambulatory Visit: Payer: Self-pay

## 2020-10-18 ENCOUNTER — Ambulatory Visit (INDEPENDENT_AMBULATORY_CARE_PROVIDER_SITE_OTHER): Payer: Medicare Other | Admitting: Gastroenterology

## 2020-10-18 ENCOUNTER — Encounter: Payer: Self-pay | Admitting: Gastroenterology

## 2020-10-18 VITALS — BP 139/74 | HR 88 | Temp 97.9°F | Ht 63.0 in | Wt 235.8 lb

## 2020-10-18 DIAGNOSIS — K219 Gastro-esophageal reflux disease without esophagitis: Secondary | ICD-10-CM

## 2020-10-18 DIAGNOSIS — K7460 Unspecified cirrhosis of liver: Secondary | ICD-10-CM

## 2020-10-18 MED ORDER — DEXLANSOPRAZOLE 60 MG PO CPDR: 60.0000 mg | DELAYED_RELEASE_CAPSULE | Freq: Every day | ORAL | 3 refills | Status: DC

## 2020-10-18 NOTE — Patient Instructions (Signed)
Start Dexilant 60mg  daily before breakfast. You can still use Pepcid, TUMS, Rolaids, etc if you need for breakthrough symptoms.  Please go for labs and ultrasound in 11/2020. Return to the office in 01/2021. We will schedule you for colonoscopy at that time and reassess your reflux. Call me or send message via MyChart if any questions.

## 2020-10-23 ENCOUNTER — Telehealth: Payer: Self-pay

## 2020-10-23 NOTE — Telephone Encounter (Signed)
Documentation from [facility] regarding the pt receiving Dexlansoprazole and Famotidine. They want to discontinue one of the above agents or evaluate for H. Pylori eradication therapy. Will bring documentation to your office

## 2020-10-23 NOTE — Telephone Encounter (Signed)
noted 

## 2020-10-23 NOTE — Telephone Encounter (Signed)
Completed and can be faxed back. No change in therapy. Patient has been tested for H.pylori and is negative. She is only on famotidine because we are having trouble getting her PPI covered.

## 2020-10-25 NOTE — Progress Notes (Deleted)
Office Visit Note  Patient: Wendy Kramer             Date of Birth: Dec 26, 1960           MRN: [id number]             PCP: Rosita Fire, MD Referring: Rosita Fire, MD Visit Date: 11/08/2020 Occupation: '@GUAROCC'$ @  Subjective:  No chief complaint on file.   History of Present Illness: Wendy Kramer is a 60 y.o. female ***   Activities of Daily Living:  Patient reports morning stiffness for *** {minute/hour:19697}.   Patient {ACTIONS;DENIES/REPORTS:21021675::"Denies"} nocturnal pain.  Difficulty dressing/grooming: {ACTIONS;DENIES/REPORTS:21021675::"Denies"} Difficulty climbing stairs: {ACTIONS;DENIES/REPORTS:21021675::"Denies"} Difficulty getting out of chair: {ACTIONS;DENIES/REPORTS:21021675::"Denies"} Difficulty using hands for taps, buttons, cutlery, and/or writing: {ACTIONS;DENIES/REPORTS:21021675::"Denies"}  No Rheumatology ROS completed.   PMFS History:  Patient Active Problem List   Diagnosis Date Noted  . Hepatic cirrhosis ([place]) 05/23/2020  . LUQ pain 11/26/2019  . Encounter for gynecological examination with Papanicolaou smear of cervix 10/06/2019  . Routine cervical smear 10/06/2019  . Encounter for screening fecal occult blood testing 10/06/2019  . Uncontrolled type 2 diabetes mellitus with hyperglycemia ([place]) 09/27/2019  . Mixed hyperlipidemia 09/27/2019  . Morbid obesity ([place]) 09/27/2019  . Abnormal LFTs 03/16/2019  . Diabetes mellitus without complication ([place])   . Positive colorectal cancer screening using Cologuard test 12/24/2017  . Abdominal pain, epigastric 12/24/2017  . GERD (gastroesophageal reflux disease) 12/24/2017  . Fatty liver 12/24/2017    Past Medical History:  Diagnosis Date  . Arthritis   . Cardiac murmur   . Cervical spine disease   . Diabetes mellitus without complication (HCC)    diet controlled  . Exogenous hyperlipidemia   . History of kidney stones   . HTN (hypertension)   . Morbid obesity due to excess calories ([place])   .  Other chronic pain   . Panic disorder   . Patent foramen ovale    TEE 2018  . Prediabetes   . Sleep apnea   . Stroke [facility])    "mini-stroke" no deficits from this.  Marland Kitchen Unspecified asthma with (acute) exacerbation   . Vitamin D deficiency     Family History  Problem Relation Age of Onset  . Breast cancer Maternal Aunt   . Colon cancer Other        maternal great grandfather  . Liver disease Brother        Fatty liver  . HIV Brother   . Hypertension Mother   . Hyperlipidemia Mother   . Hypertension Father   . Thyroid disease Sister   . Pancreatic cancer Sister   . Healthy Daughter   . Healthy Son   . Healthy Son   . Heart disease Son    Past Surgical History:  Procedure Laterality Date  . BIOPSY  03/05/2018   Procedure: BIOPSY;  Surgeon: Daneil Dolin, MD;  [facility];  Service: Endoscopy;;  gastric  . CHOLECYSTECTOMY    . COLONOSCOPY WITH PROPOFOL N/A 03/05/2018   Dr. Gala Romney: Diverticulosis in the sigmoid and descending colon.  3 colon polyps removed, 2 hyperplastic and one sessile serrated polyp.  Next colonoscopy 3 years.  . ESOPHAGOGASTRODUODENOSCOPY (EGD) WITH PROPOFOL N/A 03/05/2018   Dr. Gala Romney: Erosive reflux esophagitis, small hiatal hernia, erosive gastropathy with reactive gastropathy on biopsy.  No H. pylori.  Marland Kitchen HERNIA REPAIR     umbilical  . POLYPECTOMY  03/05/2018   Procedure: POLYPECTOMY;  Surgeon: Daneil Dolin, MD;  [facility];  Service: Endoscopy;;  colon  . SHOULDER ARTHROSCOPY Right   . TEE WITHOUT CARDIOVERSION  2018   patent foramen ovale  . TONSILLECTOMY AND ADENOIDECTOMY    . TUBAL LIGATION     Social History   Social History Narrative  . Not on file    There is no immunization history on file for this patient.   Objective: Vital Signs: LMP 09/06/2016    Physical Exam   Musculoskeletal Exam: ***  CDAI Exam: CDAI Score: -- Patient Global: --; Provider Global: -- Swollen: --; Tender: -- Joint Exam 11/08/2020    No joint exam has been documented for this visit   There is currently no information documented on the homunculus. Go to the Rheumatology activity and complete the homunculus joint exam.  Investigation: No additional findings.  Imaging: MM 3D SCREEN BREAST BILATERAL  Result Date: 10/09/2020 CLINICAL DATA:  Screening. EXAM: DIGITAL SCREENING BILATERAL MAMMOGRAM WITH TOMOSYNTHESIS AND CAD TECHNIQUE: Bilateral screening digital craniocaudal and mediolateral oblique mammograms were obtained. Bilateral screening digital breast tomosynthesis was performed. The images were evaluated with computer-aided detection. COMPARISON:  Previous exam(s). ACR Breast Density Category b: There are scattered areas of fibroglandular density. FINDINGS: There are no findings suspicious for malignancy. IMPRESSION: No mammographic evidence of malignancy. A result letter of this screening mammogram will be mailed directly to the patient. RECOMMENDATION: Screening mammogram in one year. (Code:SM-B-01Y) BI-RADS CATEGORY  1: Negative. Electronically Signed   By: Franki Cabot M.D.   On: 10/09/2020 08:26    Recent Labs: Lab Results  Component Value Date   WBC 7.3 06/07/2020   HGB 14.2 06/07/2020   PLT 194 06/07/2020   NA 143 06/07/2020   K 4.5 06/07/2020   CL 105 06/07/2020   CO2 27 06/07/2020   GLUCOSE 126 (H) 06/07/2020   BUN 10 06/07/2020   CREATININE 0.79 06/07/2020   BILITOT 0.6 06/07/2020   ALKPHOS 103 02/15/2020   AST 33 06/07/2020   ALT 23 06/07/2020   PROT 6.5 06/07/2020   ALBUMIN 4.5 02/15/2020   CALCIUM 9.5 06/07/2020   GFRAA 85 02/15/2020    Speciality Comments: No specialty comments available.  Procedures:  No procedures performed Allergies: Asa [aspirin], Augmentin [amoxicillin-pot clavulanate], Levaquin [levofloxacin in d5w], and Lovenox [enoxaparin sodium]   Assessment / Plan:     Visit Diagnoses: No diagnosis found.  Orders: No orders of the defined types were placed in this  encounter.  No orders of the defined types were placed in this encounter.   Face-to-face time spent with patient was *** minutes. Greater than 50% of time was spent in counseling and coordination of care.  Follow-Up Instructions: No follow-ups on file.   Earnestine Mealing, CMA  Note - This record has been created using Editor, commissioning.  Chart creation errors have been sought, but may not always  have been located. Such creation errors do not reflect on  the standard of medical care.

## 2020-11-08 ENCOUNTER — Ambulatory Visit: Payer: Medicare Other | Admitting: Rheumatology

## 2020-11-08 DIAGNOSIS — Z8719 Personal history of other diseases of the digestive system: Secondary | ICD-10-CM

## 2020-11-08 DIAGNOSIS — R195 Other fecal abnormalities: Secondary | ICD-10-CM

## 2020-11-08 DIAGNOSIS — M5030 Other cervical disc degeneration, unspecified cervical region: Secondary | ICD-10-CM

## 2020-11-08 DIAGNOSIS — G8929 Other chronic pain: Secondary | ICD-10-CM

## 2020-11-08 DIAGNOSIS — E559 Vitamin D deficiency, unspecified: Secondary | ICD-10-CM

## 2020-11-08 DIAGNOSIS — E119 Type 2 diabetes mellitus without complications: Secondary | ICD-10-CM

## 2020-11-08 DIAGNOSIS — R768 Other specified abnormal immunological findings in serum: Secondary | ICD-10-CM

## 2020-11-08 DIAGNOSIS — K760 Fatty (change of) liver, not elsewhere classified: Secondary | ICD-10-CM

## 2020-11-08 DIAGNOSIS — Z8709 Personal history of other diseases of the respiratory system: Secondary | ICD-10-CM

## 2020-11-08 DIAGNOSIS — Z8669 Personal history of other diseases of the nervous system and sense organs: Secondary | ICD-10-CM

## 2020-11-08 DIAGNOSIS — I10 Essential (primary) hypertension: Secondary | ICD-10-CM

## 2020-11-08 DIAGNOSIS — R945 Abnormal results of liver function studies: Secondary | ICD-10-CM

## 2020-11-08 DIAGNOSIS — Q211 Atrial septal defect: Secondary | ICD-10-CM

## 2020-11-14 DIAGNOSIS — E119 Type 2 diabetes mellitus without complications: Secondary | ICD-10-CM | POA: Diagnosis not present

## 2020-11-14 DIAGNOSIS — I10 Essential (primary) hypertension: Secondary | ICD-10-CM | POA: Diagnosis not present

## 2020-11-28 ENCOUNTER — Telehealth: Payer: Self-pay

## 2020-11-28 NOTE — Telephone Encounter (Signed)
Pt due for [place] abd RUQ 11/2020 per last OV note. [place] scheduled for 12/11/20 at 9:30am, arrive at 9:15am. NPO after midnight before test.  Tried to call pt to inform her of [place] appt. Received recording the number has been changed or disconnected. Appt letter mailed.

## 2020-12-11 ENCOUNTER — Ambulatory Visit (HOSPITAL_COMMUNITY): Payer: Medicare Other

## 2020-12-11 NOTE — Progress Notes (Deleted)
Office Visit Note  Patient: Wendy Kramer             Date of Birth: [birth year]           MRN: [id number]             PCP: Rosita Fire, MD Referring: Rosita Fire, MD Visit Date: 12/25/2020 Occupation: _0 @  Subjective:    History of Present Illness: Wendy Kramer is a 60 y.o. female with history of positive ANA, polyarthralgia, and DDD.  Lab work from 05/11/20 was reviewed today in the office: ANA negative, complements WNL, dsDNA negative, ESR WNL, Anticardiolipin IgA 33.3, and beta-2 glycoprotein IgA 38.7.    Activities of Daily Living:  Patient reports morning stiffness for *** {minute/hour:19697}.   Patient {ACTIONS;DENIES/REPORTS:21021675::"Denies"} nocturnal pain.  Difficulty dressing/grooming: {ACTIONS;DENIES/REPORTS:21021675::"Denies"} Difficulty climbing stairs: {ACTIONS;DENIES/REPORTS:21021675::"Denies"} Difficulty getting out of chair: {ACTIONS;DENIES/REPORTS:21021675::"Denies"} Difficulty using hands for taps, buttons, cutlery, and/or writing: {ACTIONS;DENIES/REPORTS:21021675::"Denies"}  No Rheumatology ROS completed.   PMFS History:  Patient Active Problem List   Diagnosis Date Noted   Hepatic cirrhosis ([place]) 05/23/2020   LUQ pain 11/26/2019   Encounter for gynecological examination with Papanicolaou smear of cervix 10/06/2019   Routine cervical smear 10/06/2019   Encounter for screening fecal occult blood testing 10/06/2019   Uncontrolled type 2 diabetes mellitus with hyperglycemia ([place]) 09/27/2019   Mixed hyperlipidemia 09/27/2019   Morbid obesity ([place]) 09/27/2019   Abnormal LFTs 03/16/2019   Diabetes mellitus without complication ([place])    Positive colorectal cancer screening using Cologuard test 12/24/2017   Abdominal pain, epigastric 12/24/2017   GERD (gastroesophageal reflux disease) 12/24/2017   Fatty liver 12/24/2017    Past Medical History:  Diagnosis Date   Arthritis    Cardiac murmur    Cervical spine disease    Diabetes mellitus  without complication (HCC)    diet controlled   Exogenous hyperlipidemia    History of kidney stones    HTN (hypertension)    Morbid obesity due to excess calories (HCC)    Other chronic pain    Panic disorder    Patent foramen ovale    TEE 2018   Prediabetes    Sleep apnea    Stroke ([place])    "mini-stroke" no deficits from this.   Unspecified asthma with (acute) exacerbation    Vitamin D deficiency     Family History  Problem Relation Age of Onset   Breast cancer Maternal Aunt    Colon cancer Other        maternal great grandfather   Liver disease Brother        Fatty liver   HIV Brother    Hypertension Mother    Hyperlipidemia Mother    Hypertension Father    Thyroid disease Sister    Pancreatic cancer Sister    Healthy Daughter    Healthy Son    Healthy Son    Heart disease Son    Past Surgical History:  Procedure Laterality Date   BIOPSY  03/05/2018   Procedure: BIOPSY;  Surgeon: Daneil Dolin, MD;  [facility];  Service: Endoscopy;;  gastric   CHOLECYSTECTOMY     COLONOSCOPY WITH PROPOFOL N/A 03/05/2018   Dr. Gala Romney: Diverticulosis in the sigmoid and descending colon.  3 colon polyps removed, 2 hyperplastic and one sessile serrated polyp.  Next colonoscopy 3 years.   ESOPHAGOGASTRODUODENOSCOPY (EGD) WITH PROPOFOL N/A 03/05/2018   Dr. Gala Romney: Erosive reflux esophagitis, small hiatal hernia, erosive gastropathy with reactive gastropathy on biopsy.  No H.  pylori.   HERNIA REPAIR     umbilical   POLYPECTOMY  03/05/2018   Procedure: POLYPECTOMY;  Surgeon: Daneil Dolin, MD;  [facility];  Service: Endoscopy;;  colon   SHOULDER ARTHROSCOPY Right    TEE WITHOUT CARDIOVERSION  2018   patent foramen ovale   TONSILLECTOMY AND ADENOIDECTOMY     TUBAL LIGATION     Social History   Social History Narrative   Not on file    There is no immunization history on file for this patient.   Objective: Vital Signs: LMP 09/06/2016    Physical  Exam Vitals and nursing note reviewed.  Constitutional:      Appearance: She is well-developed.  HENT:     Head: Normocephalic and atraumatic.  Eyes:     Conjunctiva/sclera: Conjunctivae normal.  Pulmonary:     Effort: Pulmonary effort is normal.  Abdominal:     Palpations: Abdomen is soft.  Musculoskeletal:     Cervical back: Normal range of motion.  Skin:    General: Skin is warm and dry.     Capillary Refill: Capillary refill takes less than 2 seconds.  Neurological:     Mental Status: She is alert and oriented to person, place, and time.  Psychiatric:        Behavior: Behavior normal.     Musculoskeletal Exam: ***  CDAI Exam: CDAI Score: -- Patient Global: --; Provider Global: -- Swollen: --; Tender: -- Joint Exam 12/25/2020   No joint exam has been documented for this visit   There is currently no information documented on the homunculus. Go to the Rheumatology activity and complete the homunculus joint exam.  Investigation: No additional findings.  Imaging: US Abdomen Limited RUQ (LIVER/GB)  Result Date: 12/18/2020 CLINICAL DATA:  Cirrhosis, hepatoma screening. History of cholecystectomy. EXAM: ULTRASOUND ABDOMEN LIMITED RIGHT UPPER QUADRANT COMPARISON:  None. FINDINGS: Gallbladder: Surgically absent. Common bile duct: Diameter: 4.4 mm. Liver: The liver demonstrates increased echogenicity. There is nodular liver contour. No focal lesions are seen. Portal vein is patent on color Doppler imaging with normal direction of blood flow towards the liver. Other: Examination is limited secondary to body habitus. IMPRESSION: 1. Nodular liver contour with increased echogenicity worrisome for diffuse hepatocellular disease/cirrhosis. No focal hepatic lesion identified. 2. Cholecystectomy. Electronically Signed   By: Ronney Asters M.D.   On: 12/18/2020 20:00    Recent Labs: Lab Results  Component Value Date   WBC 7.3 06/07/2020   HGB 14.2 06/07/2020   PLT 194 06/07/2020   NA  143 06/07/2020   K 4.5 06/07/2020   CL 105 06/07/2020   CO2 27 06/07/2020   GLUCOSE 126 (H) 06/07/2020   BUN 10 06/07/2020   CREATININE 0.79 06/07/2020   BILITOT 0.6 06/07/2020   ALKPHOS 103 02/15/2020   AST 33 06/07/2020   ALT 23 06/07/2020   PROT 6.5 06/07/2020   ALBUMIN 4.5 02/15/2020   CALCIUM 9.5 06/07/2020   GFRAA 85 02/15/2020    Speciality Comments: No specialty comments available.  Procedures:  No procedures performed Allergies: Asa [aspirin], Augmentin [amoxicillin-pot clavulanate], Levaquin [levofloxacin in d5w], and Lovenox [enoxaparin sodium]   Assessment / Plan:     Visit Diagnoses: Positive ANA (antinuclear antibody) - ANA is low titer and not significant.  ENA was negative.  Complements were normal.  Beta-2 IgA and anticardiolipin IgA were low titer positive:   Abnormal LFTs  Chronic pain of both shoulders - Right rotator cuff tear 2011.  Chronic pain of  both hips - Followed by pain management.  Chronic pain of both knees - Patient is followed by pain management.    DDD (degenerative disc disease), cervical - With moderate to severe spinal stenosis.    Chronic midline low back pain with bilateral sciatica  Diabetes mellitus without complication (HCC)  Essential hypertension  Fatty liver  Patent foramen ovale  History of sleep apnea  History of asthma  Positive colorectal cancer screening using Cologuard test  Vitamin D deficiency  History of gastroesophageal reflux (GERD)  Orders: No orders of the defined types were placed in this encounter.  No orders of the defined types were placed in this encounter.   Follow-Up Instructions: No follow-ups on file.   Ofilia Neas, PA-C  Note - This record has been created using Dragon software.  Chart creation errors have been sought, but may not always  have been located. Such creation errors do not reflect on  the standard of medical care.

## 2020-12-15 DIAGNOSIS — I10 Essential (primary) hypertension: Secondary | ICD-10-CM | POA: Diagnosis not present

## 2020-12-15 DIAGNOSIS — E119 Type 2 diabetes mellitus without complications: Secondary | ICD-10-CM | POA: Diagnosis not present

## 2020-12-18 ENCOUNTER — Ambulatory Visit (HOSPITAL_COMMUNITY)
Admission: RE | Admit: 2020-12-18 | Discharge: 2020-12-18 | Disposition: A | Discharge status: Home / Self Care | Payer: Medicare Other | Source: Ambulatory Visit | Attending: Gastroenterology | Admitting: Gastroenterology

## 2020-12-18 ENCOUNTER — Other Ambulatory Visit: Payer: Self-pay

## 2020-12-18 DIAGNOSIS — K7460 Unspecified cirrhosis of liver: Secondary | ICD-10-CM | POA: Diagnosis not present

## 2020-12-18 DIAGNOSIS — K7689 Other specified diseases of liver: Secondary | ICD-10-CM | POA: Diagnosis not present

## 2020-12-18 DIAGNOSIS — Z9049 Acquired absence of other specified parts of digestive tract: Secondary | ICD-10-CM | POA: Diagnosis not present

## 2020-12-20 ENCOUNTER — Ambulatory Visit: Payer: Medicare Other | Admitting: Nurse Practitioner

## 2020-12-20 DIAGNOSIS — E782 Mixed hyperlipidemia: Secondary | ICD-10-CM

## 2020-12-20 DIAGNOSIS — E1165 Type 2 diabetes mellitus with hyperglycemia: Secondary | ICD-10-CM

## 2020-12-25 ENCOUNTER — Ambulatory Visit: Payer: Medicare Other | Admitting: Physician Assistant

## 2020-12-25 DIAGNOSIS — I10 Essential (primary) hypertension: Secondary | ICD-10-CM

## 2020-12-25 DIAGNOSIS — R768 Other specified abnormal immunological findings in serum: Secondary | ICD-10-CM

## 2020-12-25 DIAGNOSIS — Z8719 Personal history of other diseases of the digestive system: Secondary | ICD-10-CM

## 2020-12-25 DIAGNOSIS — G8929 Other chronic pain: Secondary | ICD-10-CM

## 2020-12-25 DIAGNOSIS — R195 Other fecal abnormalities: Secondary | ICD-10-CM

## 2020-12-25 DIAGNOSIS — Z8709 Personal history of other diseases of the respiratory system: Secondary | ICD-10-CM

## 2020-12-25 DIAGNOSIS — Q211 Atrial septal defect: Secondary | ICD-10-CM

## 2020-12-25 DIAGNOSIS — E119 Type 2 diabetes mellitus without complications: Secondary | ICD-10-CM

## 2020-12-25 DIAGNOSIS — E559 Vitamin D deficiency, unspecified: Secondary | ICD-10-CM

## 2020-12-25 DIAGNOSIS — K760 Fatty (change of) liver, not elsewhere classified: Secondary | ICD-10-CM

## 2020-12-25 DIAGNOSIS — R945 Abnormal results of liver function studies: Secondary | ICD-10-CM

## 2020-12-25 DIAGNOSIS — M5030 Other cervical disc degeneration, unspecified cervical region: Secondary | ICD-10-CM

## 2020-12-25 DIAGNOSIS — Z8669 Personal history of other diseases of the nervous system and sense organs: Secondary | ICD-10-CM

## 2020-12-27 ENCOUNTER — Ambulatory Visit: Payer: Medicare Other | Admitting: Nurse Practitioner

## 2020-12-27 DIAGNOSIS — E1165 Type 2 diabetes mellitus with hyperglycemia: Secondary | ICD-10-CM

## 2020-12-27 DIAGNOSIS — E782 Mixed hyperlipidemia: Secondary | ICD-10-CM

## 2021-01-09 ENCOUNTER — Telehealth: Payer: Self-pay

## 2021-01-09 NOTE — Telephone Encounter (Signed)
Called patient and she stated that she is currently on Prednisone 20 mg daily for 5 days to treat a bad asthma flare up from being sick and her blood glucose levels were elevated in the beginning, up to 187, but yesterday morning she was 112 and today she was 92 this morning. Patient wanted to make sure this is okay since she is on Prednisone. I advised patient that this was good and if she has any more concerns to please call us. Patient verbalized an understanding.

## 2021-01-09 NOTE — Telephone Encounter (Signed)
Pt requesting a call back. 

## 2021-01-10 ENCOUNTER — Other Ambulatory Visit: Payer: Self-pay

## 2021-01-10 ENCOUNTER — Other Ambulatory Visit (HOSPITAL_COMMUNITY): Payer: Self-pay | Admitting: Gerontology

## 2021-01-10 ENCOUNTER — Ambulatory Visit (HOSPITAL_COMMUNITY)
Admission: RE | Admit: 2021-01-10 | Discharge: 2021-01-10 | Disposition: A | Discharge status: Home / Self Care | Payer: Medicare Other | Source: Ambulatory Visit | Attending: Gerontology | Admitting: Gerontology

## 2021-01-10 ENCOUNTER — Ambulatory Visit: Payer: Medicare Other | Admitting: Nurse Practitioner

## 2021-01-10 DIAGNOSIS — E1169 Type 2 diabetes mellitus with other specified complication: Secondary | ICD-10-CM | POA: Diagnosis not present

## 2021-01-10 DIAGNOSIS — J449 Chronic obstructive pulmonary disease, unspecified: Secondary | ICD-10-CM | POA: Diagnosis not present

## 2021-01-10 DIAGNOSIS — I10 Essential (primary) hypertension: Secondary | ICD-10-CM | POA: Diagnosis not present

## 2021-01-10 DIAGNOSIS — R0989 Other specified symptoms and signs involving the circulatory and respiratory systems: Secondary | ICD-10-CM | POA: Diagnosis not present

## 2021-01-10 DIAGNOSIS — R059 Cough, unspecified: Secondary | ICD-10-CM | POA: Insufficient documentation

## 2021-01-10 DIAGNOSIS — J189 Pneumonia, unspecified organism: Secondary | ICD-10-CM | POA: Diagnosis not present

## 2021-01-23 ENCOUNTER — Ambulatory Visit: Payer: Medicare Other | Admitting: Gastroenterology

## 2021-01-31 NOTE — Patient Instructions (Signed)

## 2021-02-01 ENCOUNTER — Ambulatory Visit: Payer: Self-pay | Admitting: Nurse Practitioner

## 2021-02-01 ENCOUNTER — Encounter: Payer: Self-pay | Admitting: Nurse Practitioner

## 2021-02-01 ENCOUNTER — Other Ambulatory Visit: Payer: Self-pay

## 2021-02-01 ENCOUNTER — Ambulatory Visit (INDEPENDENT_AMBULATORY_CARE_PROVIDER_SITE_OTHER): Payer: Medicare Other | Admitting: Nurse Practitioner

## 2021-02-01 VITALS — BP 150/79 | HR 92 | Ht 63.0 in | Wt 239.0 lb

## 2021-02-01 DIAGNOSIS — E782 Mixed hyperlipidemia: Secondary | ICD-10-CM | POA: Diagnosis not present

## 2021-02-01 DIAGNOSIS — E1165 Type 2 diabetes mellitus with hyperglycemia: Secondary | ICD-10-CM | POA: Diagnosis not present

## 2021-02-01 LAB — POCT GLYCOSYLATED HEMOGLOBIN (HGB A1C): HbA1c, POC (controlled diabetic range): 8.4 % — AB (ref 0.0–7.0)

## 2021-02-01 NOTE — Progress Notes (Deleted)
Office Visit Note  Patient: Wendy Kramer             Date of Birth: 02/25/1961           MRN: [id number]             PCP: Rosita Fire, MD Referring: Rosita Fire, MD Visit Date: 02/15/2021 Occupation: @GUAROCC @  Subjective:  No chief complaint on file.   History of Present Illness: Wendy Kramer is a 59 y.o. female ***   Activities of Daily Living:  Patient reports morning stiffness for *** {minute/hour:19697}.   Patient {ACTIONS;DENIES/REPORTS:21021675::"Denies"} nocturnal pain.  Difficulty dressing/grooming: {ACTIONS;DENIES/REPORTS:21021675::"Denies"} Difficulty climbing stairs: {ACTIONS;DENIES/REPORTS:21021675::"Denies"} Difficulty getting out of chair: {ACTIONS;DENIES/REPORTS:21021675::"Denies"} Difficulty using hands for taps, buttons, cutlery, and/or writing: {ACTIONS;DENIES/REPORTS:21021675::"Denies"}  No Rheumatology ROS completed.   PMFS History:  Patient Active Problem List   Diagnosis Date Noted   Hepatic cirrhosis ([place]) 05/23/2020   LUQ pain 11/26/2019   Encounter for gynecological examination with Papanicolaou smear of cervix 10/06/2019   Routine cervical smear 10/06/2019   Encounter for screening fecal occult blood testing 10/06/2019   Uncontrolled type 2 diabetes mellitus with hyperglycemia ([place]) 09/27/2019   Mixed hyperlipidemia 09/27/2019   Morbid obesity ([place]) 09/27/2019   Abnormal LFTs 03/16/2019   Diabetes mellitus without complication ([place])    Positive colorectal cancer screening using Cologuard test 12/24/2017   Abdominal pain, epigastric 12/24/2017   GERD (gastroesophageal reflux disease) 12/24/2017   Fatty liver 12/24/2017    Past Medical History:  Diagnosis Date   Arthritis    Cardiac murmur    Cervical spine disease    Diabetes mellitus without complication (HCC)    diet controlled   Exogenous hyperlipidemia    History of kidney stones    HTN (hypertension)    Morbid obesity due to excess calories (HCC)    Other chronic pain     Panic disorder    Patent foramen ovale    TEE 2018   Prediabetes    Sleep apnea    Stroke ([place])    "mini-stroke" no deficits from this.   Unspecified asthma with (acute) exacerbation    Vitamin D deficiency     Family History  Problem Relation Age of Onset   Breast cancer Maternal Aunt    Colon cancer Other        maternal great grandfather   Liver disease Brother        Fatty liver   HIV Brother    Hypertension Mother    Hyperlipidemia Mother    Hypertension Father    Thyroid disease Sister    Pancreatic cancer Sister    Healthy Daughter    Healthy Son    Healthy Son    Heart disease Son    Past Surgical History:  Procedure Laterality Date   BIOPSY  03/05/2018   Procedure: BIOPSY;  Surgeon: Daneil Dolin, MD;  [facility];  Service: Endoscopy;;  gastric   CHOLECYSTECTOMY     COLONOSCOPY WITH PROPOFOL N/A 03/05/2018   Dr. Gala Romney: Diverticulosis in the sigmoid and descending colon.  3 colon polyps removed, 2 hyperplastic and one sessile serrated polyp.  Next colonoscopy 3 years.   ESOPHAGOGASTRODUODENOSCOPY (EGD) WITH PROPOFOL N/A 03/05/2018   Dr. Gala Romney: Erosive reflux esophagitis, small hiatal hernia, erosive gastropathy with reactive gastropathy on biopsy.  No H. pylori.   HERNIA REPAIR     umbilical   POLYPECTOMY  03/05/2018   Procedure: POLYPECTOMY;  Surgeon: Daneil Dolin, MD;  [facility];  Service: Endoscopy;;  colon   SHOULDER ARTHROSCOPY Right    TEE WITHOUT CARDIOVERSION  2018   patent foramen ovale   TONSILLECTOMY AND ADENOIDECTOMY     TUBAL LIGATION     Social History   Social History Narrative   Not on file    There is no immunization history on file for this patient.   Objective: Vital Signs: LMP 09/06/2016    Physical Exam   Musculoskeletal Exam: ***  CDAI Exam: CDAI Score: -- Patient Global: --; Provider Global: -- Swollen: --; Tender: -- Joint Exam 02/15/2021   No joint exam has been documented for this visit    There is currently no information documented on the homunculus. Go to the Rheumatology activity and complete the homunculus joint exam.  Investigation: No additional findings.  Imaging: DG Chest 2 View  Result Date: 01/11/2021 CLINICAL DATA:  60 year old female with cough EXAM: CHEST - 2 VIEW COMPARISON:  03/10/2019 FINDINGS: Cardiomediastinal silhouette unchanged in size and contour. Increased airspace and interstitial opacity at the right lung base corresponding to the middle lobe on the lateral view. No pneumothorax. No pleural effusion. Degenerative changes.  No acute displaced fracture IMPRESSION: Lobar pneumonia, right middle lobe. Followup PA and lateral chest X-ray is recommended in 3-4 weeks following therapy to assure resolution. Electronically Signed   By: Corrie Mckusick D.O.   On: 01/11/2021 09:21    Recent Labs: Lab Results  Component Value Date   WBC 7.3 06/07/2020   HGB 14.2 06/07/2020   PLT 194 06/07/2020   NA 143 06/07/2020   K 4.5 06/07/2020   CL 105 06/07/2020   CO2 27 06/07/2020   GLUCOSE 126 (H) 06/07/2020   BUN 10 06/07/2020   CREATININE 0.79 06/07/2020   BILITOT 0.6 06/07/2020   ALKPHOS 103 02/15/2020   AST 33 06/07/2020   ALT 23 06/07/2020   PROT 6.5 06/07/2020   ALBUMIN 4.5 02/15/2020   CALCIUM 9.5 06/07/2020   GFRAA 85 02/15/2020    Speciality Comments: No specialty comments available.  Procedures:  No procedures performed Allergies: Asa [aspirin], Augmentin [amoxicillin-pot clavulanate], Levaquin [levofloxacin in d5w], and Lovenox [enoxaparin sodium]   Assessment / Plan:     Visit Diagnoses: No diagnosis found.  Orders: No orders of the defined types were placed in this encounter.  No orders of the defined types were placed in this encounter.   Face-to-face time spent with patient was *** minutes. Greater than 50% of time was spent in counseling and coordination of care.  Follow-Up Instructions: No follow-ups on file.   Earnestine Mealing, CMA  Note - This record has been created using Editor, commissioning.  Chart creation errors have been sought, but may not always  have been located. Such creation errors do not reflect on  the standard of medical care.

## 2021-02-01 NOTE — Progress Notes (Signed)
02/01/2021, 2:22 PM   Endocrinology follow-up note  Subjective:    Patient ID: Wendy Kramer, female    DOB: 27-Jun-1960.  Wendy Kramer is being seen in follow-up after she was seen in consultation for management of currently uncontrolled symptomatic diabetes requested by  Rosita Fire, MD.   Past Medical History:  Diagnosis Date   Arthritis    Cardiac murmur    Cervical spine disease    Diabetes mellitus without complication ([place])    diet controlled   Exogenous hyperlipidemia    History of kidney stones    HTN (hypertension)    Morbid obesity due to excess calories (HCC)    Other chronic pain    Panic disorder    Patent foramen ovale    TEE 2018   Prediabetes    Sleep apnea    Stroke ([place])    "mini-stroke" no deficits from this.   Unspecified asthma with (acute) exacerbation    Vitamin D deficiency     Past Surgical History:  Procedure Laterality Date   BIOPSY  03/05/2018   Procedure: BIOPSY;  Surgeon: Daneil Dolin, MD;  [facility];  Service: Endoscopy;;  gastric   CHOLECYSTECTOMY     COLONOSCOPY WITH PROPOFOL N/A 03/05/2018   Dr. Gala Romney: Diverticulosis in the sigmoid and descending colon.  3 colon polyps removed, 2 hyperplastic and one sessile serrated polyp.  Next colonoscopy 3 years.   ESOPHAGOGASTRODUODENOSCOPY (EGD) WITH PROPOFOL N/A 03/05/2018   Dr. Gala Romney: Erosive reflux esophagitis, small hiatal hernia, erosive gastropathy with reactive gastropathy on biopsy.  No H. pylori.   HERNIA REPAIR     umbilical   POLYPECTOMY  03/05/2018   Procedure: POLYPECTOMY;  Surgeon: Daneil Dolin, MD;  [facility];  Service: Endoscopy;;  colon   SHOULDER ARTHROSCOPY Right    TEE WITHOUT CARDIOVERSION  2018   patent foramen ovale   TONSILLECTOMY AND ADENOIDECTOMY     TUBAL LIGATION      Social History   Socioeconomic History   Marital status: Legally Separated     Spouse name: Not on file   Number of children: Not on file   Years of education: Not on file   Highest education level: Not on file  Occupational History   Not on file  Tobacco Use   Smoking status: Every Day    Packs/day: 0.50    Years: 40.00    Pack years: 20.00    Types: Cigarettes   Smokeless tobacco: Never  Vaping Use   Vaping Use: Never used  Substance and Sexual Activity   Alcohol use: No   Drug use: No   Sexual activity: Not Currently    Birth control/protection: Post-menopausal, Surgical    Comment: tubal  Other Topics Concern   Not on file  Social History Narrative   Not on file   Social Determinants of Health   Financial Resource Strain: Not on file  Food Insecurity: Not on file  Transportation Needs: Not on file  Physical Activity: Not on file  Stress: Not on file  Social Connections: Not on file  Family History  Problem Relation Age of Onset   Breast cancer Maternal Aunt    Colon cancer Other        maternal great grandfather   Liver disease Brother        Fatty liver   HIV Brother    Hypertension Mother    Hyperlipidemia Mother    Hypertension Father    Thyroid disease Sister    Pancreatic cancer Sister    Healthy Daughter    Healthy Son    Healthy Son    Heart disease Son     Outpatient Encounter Medications as of 02/01/2021  Medication Sig   ACCU-CHEK GUIDE test strip USE TO CHECK BLOOD SUGAR DAILY   Accu-Chek Softclix Lancets lancets Use as instructed to monitor glucose 1-2 times daily.   acetaminophen (TYLENOL) 500 MG tablet Take 500 mg by mouth as needed.    albuterol (PROVENTIL HFA;VENTOLIN HFA) 108 (90 Base) MCG/ACT inhaler Inhale 1-2 puffs into the lungs as needed for wheezing or shortness of breath.    ALPRAZolam (XANAX) 0.25 MG tablet Take 0.25 mg by mouth daily as needed.   Blood Glucose Monitoring Suppl (ACCU-CHEK GUIDE ME) w/Device KIT 1 Piece by Does not apply route as directed.   budesonide-formoterol (SYMBICORT) 80-4.5  MCG/ACT inhaler Inhale 1 puff into the lungs 2 (two) times daily.    clopidogrel (PLAVIX) 75 MG tablet Take 1 tablet (75 mg total) by mouth daily. (Patient taking differently: Take 75 mg by mouth once a week.)   cyclobenzaprine (FLEXERIL) 5 MG tablet Take 5 mg by mouth daily as needed for muscle spasms.    famotidine (PEPCID) 20 MG tablet Take one at lunch and one at bedtime.   fluticasone (FLONASE) 50 MCG/ACT nasal spray Place 1 spray into both nostrils daily as needed for allergies or rhinitis.    hydrochlorothiazide (HYDRODIURIL) 12.5 MG tablet Take 12.5 mg by mouth 2 (two) times a week.   HYDROcodone-acetaminophen (NORCO/VICODIN) 5-325 MG tablet Take 1 tablet by mouth daily as needed.   ipratropium-albuterol (DUONEB) 0.5-2.5 (3) MG/3ML SOLN SMARTSIG:1 Vial(s) Via Nebulizer Every 4-6 Hours PRN   lisinopril (ZESTRIL) 20 MG tablet Take 20 mg by mouth daily.   metFORMIN (GLUCOPHAGE) 1000 MG tablet Take 1,000 mg by mouth 2 (two) times daily.   Multiple Vitamin (MULTIVITAMIN ADULT PO) Take 1 tablet by mouth daily.   traMADol (ULTRAM) 50 MG tablet Take 50 mg by mouth every 6 (six) hours as needed for moderate pain.    triamcinolone (KENALOG) 0.025 % cream Apply topically as needed.   [DISCONTINUED] ALPRAZolam (XANAX) 0.5 MG tablet Take 0.25 mg by mouth daily as needed for anxiety.  (Patient not taking: Reported on 02/01/2021)   [DISCONTINUED] dexlansoprazole (DEXILANT) 60 MG capsule Take 1 capsule (60 mg total) by mouth daily before breakfast. (Patient not taking: Reported on 02/01/2021)   No facility-administered encounter medications on file as of 02/01/2021.    ALLERGIES: Allergies  Allergen Reactions   Asa [Aspirin] Anaphylaxis   Augmentin [Amoxicillin-Pot Clavulanate] Other (See Comments)    "Retching and pass out"   Levaquin [Levofloxacin In D5w] Itching and Other (See Comments)    "body turned red"   Lovenox [Enoxaparin Sodium] Other (See Comments)    phsyician instructed not to use ever  again. "white veins popped out"    VACCINATION STATUS:  There is no immunization history on file for this patient.  Diabetes She presents for her follow-up diabetic visit. She has type 2 diabetes mellitus. Onset time: She  was diagnosed at approximate age of 68 years, after several years of prediabetes. Her disease course has been worsening. There are no hypoglycemic associated symptoms. Pertinent negatives for hypoglycemia include no confusion, headaches, pallor or seizures. Pertinent negatives for diabetes include no chest pain, no fatigue, no polydipsia, no polyphagia and no polyuria. There are no hypoglycemic complications. Symptoms are stable. Diabetic complications include a CVA. Risk factors for coronary artery disease include diabetes mellitus, dyslipidemia, family history, obesity, tobacco exposure, post-menopausal, sedentary lifestyle and hypertension. Current diabetic treatment includes oral agent (monotherapy). She is compliant with treatment most of the time. Her weight is stable. She is following a generally unhealthy diet. When asked about meal planning, she reported none. She has not had a previous visit with a dietitian. She never participates in exercise. Her home blood glucose trend is fluctuating minimally. (She presents today with her meter, no logs, showing slightly increased glycemic profile over the last several weeks (she does not routinely monitor glucose due to monotherapy with Metformin alone).  She has been on oral steroids for URI symptoms.  She reports she forgot to take her meds for several days and did not do well with her diet when her sister came to visit, so she expected for her A1c to be high this visit.  She denies any s/s of hypoglycemia.) An ACE inhibitor/angiotensin II receptor blocker is being taken. She does not see a podiatrist.Eye exam is current.  Hyperlipidemia This is a chronic problem. The current episode started more than 1 year ago. Factors aggravating her  hyperlipidemia include fatty foods. Pertinent negatives include no chest pain, myalgias or shortness of breath. She is currently on no antihyperlipidemic treatment. Compliance problems include adherence to diet and adherence to exercise.  Risk factors for coronary artery disease include diabetes mellitus, hypertension, obesity, post-menopausal and a sedentary lifestyle.  Hypertension This is a chronic problem. The current episode started more than 1 year ago. The problem has been waxing and waning since onset. The problem is uncontrolled. Pertinent negatives include no chest pain, headaches, palpitations or shortness of breath. There are no associated agents to hypertension. Risk factors for coronary artery disease include dyslipidemia, diabetes mellitus, obesity, smoking/tobacco exposure, sedentary lifestyle, post-menopausal state and family history. Past treatments include ACE inhibitors and diuretics. The current treatment provides moderate improvement. Compliance problems include diet and exercise.  Hypertensive end-organ damage includes CVA. Identifiable causes of hypertension include sleep apnea.     Review of systems  Constitutional: + Minimally fluctuating body weight,  current Body mass index is 42.34 kg/m. , no fatigue, no subjective hyperthermia, no subjective hypothermia Eyes: no blurry vision, no xerophthalmia ENT: no sore throat, no nodules palpated in throat, no dysphagia/odynophagia, no hoarseness Cardiovascular: no chest pain, no shortness of breath, no palpitations, no leg swelling Respiratory: no cough, + shortness of breath- recent URI Gastrointestinal: no nausea/vomiting/diarrhea Musculoskeletal: no muscle/joint aches Skin: no rashes, no hyperemia Neurological: no tremors, no numbness, no tingling, no dizziness Psychiatric: no depression, no anxiety   Objective:    BP (!) 150/79   Pulse 92   Ht _0  (1.6 m)   Wt 239 lb (108.4 kg)   LMP 09/06/2016   BMI 42.34 kg/m    Wt Readings from Last 3 Encounters:  02/01/21 239 lb (108.4 kg)  10/18/20 235 lb 12.8 oz (107 kg)  06/19/20 237 lb 3.2 oz (107.6 kg)   BP Readings from Last 3 Encounters:  02/01/21 (!) 150/79  10/18/20 139/74  06/19/20 (!) 157/83  Physical Exam- Limited  Constitutional:  Body mass index is 42.34 kg/m. , not in acute distress, normal state of mind Eyes:  EOMI, no exophthalmos Neck: Supple Cardiovascular: RRR, no murmurs, rubs, or gallops, no edema Respiratory: dyspnea at rest, no crackles, rales, rhonchi, + audible wheezing Musculoskeletal: no gross deformities, strength intact in all four extremities, no gross restriction of joint movements Skin:  no rashes, no hyperemia Neurological: no tremor with outstretched hands    CMP ( most recent) CMP     Component Value Date/Time   NA 143 06/07/2020 0906   NA 142 02/15/2020 1018   K 4.5 06/07/2020 0906   CL 105 06/07/2020 0906   CO2 27 06/07/2020 0906   GLUCOSE 126 (H) 06/07/2020 0906   BUN 10 06/07/2020 0906   BUN 12 02/15/2020 1018   CREATININE 0.79 06/07/2020 0906   CALCIUM 9.5 06/07/2020 0906   PROT 6.5 06/07/2020 0906   PROT 7.2 02/15/2020 1018   ALBUMIN 4.5 02/15/2020 1018   AST 33 06/07/2020 0906   ALT 23 06/07/2020 0906   ALKPHOS 103 02/15/2020 1018   BILITOT 0.6 06/07/2020 0906   BILITOT 0.4 02/15/2020 1018   GFRNONAA 74 02/15/2020 1018   GFRAA 85 02/15/2020 1018     Diabetic Labs (most recent): Lab Results  Component Value Date   HGBA1C 8.4 (A) 02/01/2021   HGBA1C 7.6 (A) 06/19/2020   HGBA1C 8.0 (A) 02/09/2020     Lab Results  Component Value Date   TSH 1.85 07/23/2019      Assessment & Plan:   1) Uncontrolled type 2 diabetes mellitus with hyperglycemia (HCC)  - Wendy Kramer has currently uncontrolled symptomatic type 2 DM since  60 years of age.  She presents today with her meter, no logs, showing slightly increased glycemic profile over the last several weeks (she does not routinely  monitor glucose due to monotherapy with Metformin alone).  She has been on oral steroids for URI symptoms.  She reports she forgot to take her meds for several days and did not do well with her diet when her sister came to visit, so she expected for her A1c to be high this visit.  She denies any s/s of hypoglycemia.  - I had a long discussion with her about the progressive nature of diabetes and the pathology behind its complications. -her diabetes is complicated by obesity/sedentary life, active smoking and she remains at a high risk for more acute and chronic complications which include CAD, CVA, CKD, retinopathy, and neuropathy. These are all discussed in detail with her.  - Nutritional counseling repeated at each appointment due to patients tendency to fall back in to old habits.  - The patient admits there is a room for improvement in their diet and drink choices. -  Suggestion is made for the patient to avoid simple carbohydrates from their diet including Cakes, Sweet Desserts / Pastries, Ice Cream, Soda (diet and regular), Sweet Tea, Candies, Chips, Cookies, Sweet Pastries, Store Bought Juices, Alcohol in Excess of 1-2 drinks a day, Artificial Sweeteners, Coffee Creamer, and "Sugar-free" Products. This will help patient to have stable blood glucose profile and potentially avoid unintended weight gain.   - I encouraged the patient to switch to unprocessed or minimally processed complex starch and increased protein intake (animal or plant source), fruits, and vegetables.   - Patient is advised to stick to a routine mealtimes to eat 3 meals a day and avoid unnecessary snacks (to snack only to correct hypoglycemia).  -  she will be scheduled with Jearld Fenton, RDN, CDE for diabetes education.  - I have approached her with the following individualized plan to manage  her diabetes and patient agrees:   -Although her A1c is slightly worse this visit, will keep her on same medication regimen at  this time.  She is encouraged to work on diet and exercise to regain optimal control of her diabetes.  She is advised to continue Metformin 1000 mg po twice daily with meals.    - Specific targets for  A1c;  LDL, HDL,  and Triglycerides were discussed with the patient.  2) Blood Pressure /Hypertension:   Her blood pressure is still not controlled to target.  She is advised to continue Lisinopril 20 mg po daily and HCTZ 12.5 mg twice a week and follow up with her PCP for concerns.  3) Lipids/Hyperlipidemia:  There is no recent lipid panel to review.  She is not currently on any lipid lowering agents.  She reports she recently had her physical with her PCP back in July who had lipids checked.  She will have PCP forward [place] her labs for record as well.  She is very sensitive to medications which may be why she is not on any statin medications.  4) Weight/Diet:  Her Body mass index is 42.34 kg/m.  -   clearly complicating her diabetes care.   she is  a candidate for weight loss. I discussed with her the fact that loss of 5 - 10% of her  current body weight will have the most impact on her diabetes management.  Exercise, and detailed carbohydrates information provided  -  detailed on discharge instructions.  5) Chronic Care/Health Maintenance: -she is on ACEI medications and is encouraged to initiate and continue to follow up with Ophthalmology, Dentist,  Podiatrist at least yearly or according to recommendations, and advised to  Quit smoking. I have recommended yearly flu vaccine and pneumonia vaccine at least every 5 years; moderate intensity exercise for up to 150 minutes weekly; and  sleep for at least 7 hours a day.  - she is advised to maintain close follow up with Rosita Fire, MD for primary care needs, as well as her other providers for optimal and coordinated care.    I spent 30 minutes in the care of the patient today including review of labs from [facility], Lipids, Thyroid Function, Hematology  (current and previous including abstractions from other facilities); face-to-face time discussing  her blood glucose readings/logs, discussing hypoglycemia and hyperglycemia episodes and symptoms, medications doses, her options of short and long term treatment based on the latest standards of care / guidelines;  discussion about incorporating lifestyle medicine;  and documenting the encounter.    Please refer to Patient Instructions for Blood Glucose Monitoring and Insulin/Medications Dosing Guide"  in media tab for additional information. Please  also refer to " Patient Self Inventory" in the Media  tab for reviewed elements of pertinent patient history.  Wendy Kramer participated in the discussions, expressed understanding, and voiced agreement with the above plans.  All questions were answered to her satisfaction. she is encouraged to contact clinic should she have any questions or concerns prior to her return visit.   Follow up plan: - Return in about 6 months (around 08/01/2021) for Diabetes F/U with A1c in office, No previsit labs.  Rayetta Pigg, [facility] [address] [place], [place] [address] Phone: [phone] Fax: [phone]  02/01/2021, 2:22 PM

## 2021-02-05 ENCOUNTER — Telehealth: Payer: Self-pay

## 2021-02-05 ENCOUNTER — Other Ambulatory Visit: Payer: Self-pay | Admitting: Gastroenterology

## 2021-02-05 MED ORDER — RABEPRAZOLE SODIUM 20 MG PO TBEC: 20.0000 mg | DELAYED_RELEASE_TABLET | Freq: Every day | ORAL | 3 refills | Status: DC

## 2021-02-05 NOTE — Telephone Encounter (Signed)
PA for Rabeprazole sodium 20 mg tablets was approved from 04/01/2020 through 02/05/2022. Approval letter to be scanned into pt's chart.

## 2021-02-05 NOTE — Telephone Encounter (Signed)
Noted. Will await PA.

## 2021-02-06 NOTE — Telephone Encounter (Signed)
Pt was made aware and verbalized understanding.  

## 2021-02-06 NOTE — Telephone Encounter (Signed)
Great! Let's make sure patient is aware. As soon as she gets rabeprazole in for about a week, she should start trying to come of famotidine and use it only twice daily as needed.

## 2021-02-15 ENCOUNTER — Ambulatory Visit: Payer: Medicare Other | Admitting: Physician Assistant

## 2021-02-15 DIAGNOSIS — Z8669 Personal history of other diseases of the nervous system and sense organs: Secondary | ICD-10-CM

## 2021-02-15 DIAGNOSIS — R195 Other fecal abnormalities: Secondary | ICD-10-CM

## 2021-02-15 DIAGNOSIS — E119 Type 2 diabetes mellitus without complications: Secondary | ICD-10-CM

## 2021-02-15 DIAGNOSIS — Q2112 Patent foramen ovale: Secondary | ICD-10-CM

## 2021-02-15 DIAGNOSIS — R7989 Other specified abnormal findings of blood chemistry: Secondary | ICD-10-CM

## 2021-02-15 DIAGNOSIS — G8929 Other chronic pain: Secondary | ICD-10-CM

## 2021-02-15 DIAGNOSIS — I10 Essential (primary) hypertension: Secondary | ICD-10-CM

## 2021-02-15 DIAGNOSIS — Z8719 Personal history of other diseases of the digestive system: Secondary | ICD-10-CM

## 2021-02-15 DIAGNOSIS — M5030 Other cervical disc degeneration, unspecified cervical region: Secondary | ICD-10-CM

## 2021-02-15 DIAGNOSIS — R768 Other specified abnormal immunological findings in serum: Secondary | ICD-10-CM

## 2021-02-15 DIAGNOSIS — E559 Vitamin D deficiency, unspecified: Secondary | ICD-10-CM

## 2021-02-15 DIAGNOSIS — K760 Fatty (change of) liver, not elsewhere classified: Secondary | ICD-10-CM

## 2021-02-15 DIAGNOSIS — Z8709 Personal history of other diseases of the respiratory system: Secondary | ICD-10-CM

## 2021-02-16 DIAGNOSIS — I10 Essential (primary) hypertension: Secondary | ICD-10-CM | POA: Diagnosis not present

## 2021-02-16 DIAGNOSIS — E119 Type 2 diabetes mellitus without complications: Secondary | ICD-10-CM | POA: Diagnosis not present

## 2021-02-19 NOTE — Progress Notes (Signed)
Referring Provider: Rosita Fire, MD Primary Care Physician:  Rosita Fire, MD Primary GI Physician: Dr. Gala Romney  Chief Complaint  Patient presents with   Gastroesophageal Reflux    ok   Cirrhosis   Colonoscopy    Due for tcs     HPI:   Wendy Kramer is a 60 y.o. female with history of GERD, fatty liver, mildly nodular contour liver surface/borderline splenomegaly concerning for cirrhosis, elevated AST/ALT, positive ANA (referred to hematology) with normal immunoglobulins.  Suspected NASH. She is presenting today for routine follow-up up and to discuss scheduling colonoscopy.   Colonoscopy December 2019, diverticulosis in the sigmoid colon and descending colon.  3 polyps removed, 2 hyperplastic and one sessile serrated polyp.  Repeat colonoscopy in 3 years.   EGD December 2019 with erosive reflux esophagitis, small hiatal hernia, erosive gastropathy.  Pathology with reactive gastropathy.  No H. Pylori.  Previously with concerns about interactions between aciphex and Plavix. Tried to switch her to lansoprazole, dexilant or pantoprazole. With pantoprazole, she developed pressure in her ears and headache.  She had similar reaction with omeprazole and Nexium in the past.  Last seen in our office in July 2022. Reported heartburn was horrible on famotidine. Symptoms were better controlled on Aciphex, but insurance no longer covering.  Patient to start Dexilant 60 mg daily and continue Pepcid 20 mg twice daily as needed.  Regarding cirrhosis, she remained well compensated.  Plan for labs and ultrasound in September and consider EGD later this year at the time of surveillance colonoscopy which was due in December 2022.  Would arrange follow-up office visit prior to scheduling procedures.  Ultimately, patient reported Dexilant caused elevated blood pressure and blood sugar and discontinue Dexilant.  Famotidine not controlling symptoms.  Aciphex 20 mg daily was resumed.  Labs have not been  completed.  RUQ ultrasound September 2022 with cirrhosis, no focal liver lesions.  Today:  GERD: Well controlled with Aciphex daily. No longer taking famotidine. No nausea, vomiting, abdominal pain, or dysphagia. Only having to take Plavix weekly. Prescribed by neurologist due to history of strokes.   Cirrhosis:  Remains well compensated.  Denies abdominal distention, lower extremity edema, yellowing of the eyes or skin, changes in mental status/confusion, bruising/bleeding.  MELD 6 in March.  Hasn't received hep A/B vaccination.  Denies any lower GI symptoms.  Bowels are moving well without BRBPR or melena.    Past Medical History:  Diagnosis Date   Arthritis    Cardiac murmur    Cervical spine disease    Cirrhosis ([place])    likely secondary to NASH   Diabetes mellitus without complication (HCC)    diet controlled   Exogenous hyperlipidemia    History of kidney stones    HTN (hypertension)    Morbid obesity due to excess calories (HCC)    Other chronic pain    Panic disorder    Patent foramen ovale    TEE 2018   Prediabetes    Sleep apnea    Stroke ([place])    "mini-stroke" no deficits from this.   Unspecified asthma with (acute) exacerbation    Vitamin D deficiency     Past Surgical History:  Procedure Laterality Date   BIOPSY  03/05/2018   Procedure: BIOPSY;  Surgeon: Daneil Dolin, MD;  [facility];  Service: Endoscopy;;  gastric   CHOLECYSTECTOMY     COLONOSCOPY WITH PROPOFOL N/A 03/05/2018   Dr. Gala Romney: Diverticulosis in the sigmoid and descending colon.  3 colon polyps removed, 2 hyperplastic and one sessile serrated polyp.  Next colonoscopy 3 years.   ESOPHAGOGASTRODUODENOSCOPY (EGD) WITH PROPOFOL N/A 03/05/2018   Dr. Gala Romney: Erosive reflux esophagitis, small hiatal hernia, erosive gastropathy with reactive gastropathy on biopsy.  No H. pylori.   HERNIA REPAIR     umbilical   POLYPECTOMY  03/05/2018   Procedure: POLYPECTOMY;  Surgeon: Daneil Dolin,  MD;  [facility];  Service: Endoscopy;;  colon   SHOULDER ARTHROSCOPY Right    TEE WITHOUT CARDIOVERSION  2018   patent foramen ovale   TONSILLECTOMY AND ADENOIDECTOMY     TUBAL LIGATION      Current Outpatient Medications  Medication Sig Dispense Refill   ACCU-CHEK GUIDE test strip USE TO CHECK BLOOD SUGAR DAILY 100 strip 1   Accu-Chek Softclix Lancets lancets Use as instructed to monitor glucose 1-2 times daily. 100 each 12   acetaminophen (TYLENOL) 500 MG tablet Take 500 mg by mouth as needed.      albuterol (PROVENTIL HFA;VENTOLIN HFA) 108 (90 Base) MCG/ACT inhaler Inhale 1-2 puffs into the lungs as needed for wheezing or shortness of breath.      ALPRAZolam (XANAX) 0.25 MG tablet Take 0.25 mg by mouth daily as needed.     Blood Glucose Monitoring Suppl (ACCU-CHEK GUIDE ME) w/Device KIT 1 Piece by Does not apply route as directed. 1 kit 0   budesonide-formoterol (SYMBICORT) 80-4.5 MCG/ACT inhaler Inhale 1 puff into the lungs 2 (two) times daily.      clopidogrel (PLAVIX) 75 MG tablet Take 1 tablet (75 mg total) by mouth daily. (Patient taking differently: Take 75 mg by mouth once a week.) 30 tablet 0   cyclobenzaprine (FLEXERIL) 5 MG tablet Take 5 mg by mouth daily as needed for muscle spasms.   0   fluticasone (FLONASE) 50 MCG/ACT nasal spray Place 1 spray into both nostrils daily as needed for allergies or rhinitis.      hydrochlorothiazide (HYDRODIURIL) 12.5 MG tablet Take 12.5 mg by mouth as needed.     HYDROcodone-acetaminophen (NORCO/VICODIN) 5-325 MG tablet Take 1 tablet by mouth daily as needed.     ipratropium-albuterol (DUONEB) 0.5-2.5 (3) MG/3ML SOLN SMARTSIG:1 Vial(s) Via Nebulizer Every 4-6 Hours PRN     lisinopril (ZESTRIL) 20 MG tablet Take 20 mg by mouth 2 (two) times daily.     metFORMIN (GLUCOPHAGE) 1000 MG tablet Take 1,000 mg by mouth 2 (two) times daily.     Multiple Vitamin (MULTIVITAMIN ADULT PO) Take 1 tablet by mouth daily.     RABEprazole (ACIPHEX)  20 MG tablet Take 1 tablet (20 mg total) by mouth daily before breakfast. 90 tablet 3   traMADol (ULTRAM) 50 MG tablet Take 50 mg by mouth every 6 (six) hours as needed for moderate pain.   3   triamcinolone (KENALOG) 0.025 % cream Apply topically as needed.     No current facility-administered medications for this visit.    Allergies as of 02/21/2021 - Review Complete 02/21/2021  Allergen Reaction Noted   Asa [aspirin] Anaphylaxis 09/24/2016   Augmentin [amoxicillin-pot clavulanate] Other (See Comments) 09/24/2016   Levaquin [levofloxacin in d5w] Itching and Other (See Comments) 09/24/2016   Lovenox [enoxaparin sodium] Other (See Comments) 09/24/2016    Family History  Problem Relation Age of Onset   Breast cancer Maternal Aunt    Colon cancer Other        maternal great grandfather   Liver disease Brother  Fatty liver   HIV Brother    Hypertension Mother    Hyperlipidemia Mother    Hypertension Father    Thyroid disease Sister    Pancreatic cancer Sister    Healthy Daughter    Healthy Son    Healthy Son    Heart disease Son     Social History   Socioeconomic History   Marital status: Legally Separated    Spouse name: Not on file   Number of children: Not on file   Years of education: Not on file   Highest education level: Not on file  Occupational History   Not on file  Tobacco Use   Smoking status: Every Day    Packs/day: 0.50    Years: 40.00    Pack years: 20.00    Types: Cigarettes   Smokeless tobacco: Never  Vaping Use   Vaping Use: Never used  Substance and Sexual Activity   Alcohol use: No   Drug use: No   Sexual activity: Not Currently    Birth control/protection: Post-menopausal, Surgical    Comment: tubal  Other Topics Concern   Not on file  Social History Narrative   Not on file   Social Determinants of Health   Financial Resource Strain: Not on file  Food Insecurity: Not on file  Transportation Needs: Not on file  Physical  Activity: Not on file  Stress: Not on file  Social Connections: Not on file    Review of Systems: Gen: Denies fever, chills, cold or flulike symptoms, presyncope, syncope. CV: Denies chest pain, palpitations. Resp: Denies dyspnea or cough. GI: See HPI Heme: See HPI  Physical Exam: BP (!) 175/85   Pulse 93   Temp (!) 97.1 F (36.2 C) (Temporal)   Ht '5\' 3"'  (1.6 m)   Wt 240 lb (108.9 kg)   LMP 09/06/2016   BMI 42.51 kg/m  General:   Alert and oriented. No distress noted. Pleasant and cooperative.  Head:  Normocephalic and atraumatic. Eyes:  Conjuctiva clear without scleral icterus. Heart:  S1, S2 present without murmurs appreciated. Lungs:  Clear to auscultation bilaterally. No wheezes, rales, or rhonchi. No distress.  Abdomen:  +BS, soft, non-tender and non-distended. No rebound or guarding. No HSM or masses noted. Msk:  Symmetrical without gross deformities. Normal posture. Extremities:  Without edema. Neurologic:  Alert and  oriented x4 Psych:  Normal mood and affect.    Assessment:  60 year old female with GI history of GERD, cirrhosis likely secondary to NASH, and colon polyps due for surveillance colonoscopy, presenting today for routine follow-up.  Cirrhosis with elevated LFTs: Likely secondary to NASH, prior evaluation detailed in HPI Remains well compensated.  MELD 6 in March.  LFTs returned to normal at that time. Currently due for routine labs as well as updated EGD for variceal screening.  Also needs hepatitis A/B vaccination.  Ultrasound is up-to-date, due in March 2023.   History of colon polyps: Due for surveillance.  Last colonoscopy December 2019 with diverticulosis, 3 polyps removed, 2 hyperplastic and one sessile serrated polyp with recommendations to repeat in 3 years.  Currently without any significant lower GI symptoms.  No alarm symptoms.  GERD:  Well-controlled on Aciphex 20 mg daily.  No alarm symptoms.   Plan: Proceed with colonoscopy and upper  endoscopy with propofol with Dr. Gala Romney in the near future. The risks, benefits, and alternatives have been discussed with the patient in detail. The patient states understanding and desires to proceed. ASA 3 See separate  instructions for diabetes medication adjustments. Patient will continue Plavix which she is taking once weekly due to history of stroke. CBC, CMP, INR, AFP. Due for RUQ ultrasound March 2023. Prescription provided for hepatitis A/B vaccination series. Encouraged weight loss through diet and exercise.  Separate written instructions provided. Continue Aciphex 20 mg daily. Reinforced GERD diet/lifestyle.  Separate written instructions provided. Follow-up in 6 months or sooner if needed.   Aliene Altes, PA-C [facility] 02/21/2021

## 2021-02-20 NOTE — Progress Notes (Deleted)
Office Visit Note  Patient: Wendy Kramer             Date of Birth: Feb 22, 1961           MRN: [id number]             PCP: Rosita Fire, MD Referring: Rosita Fire, MD Visit Date: 03/05/2021 Occupation: @GUAROCC @  Subjective:    History of Present Illness: Wendy Kramer is a 60 y.o. female with history of positive ANA, polyarthralgia, and DDD.   Activities of Daily Living:  Patient reports morning stiffness for *** {minute/hour:19697}.   Patient {ACTIONS;DENIES/REPORTS:21021675::"Denies"} nocturnal pain.  Difficulty dressing/grooming: {ACTIONS;DENIES/REPORTS:21021675::"Denies"} Difficulty climbing stairs: {ACTIONS;DENIES/REPORTS:21021675::"Denies"} Difficulty getting out of chair: {ACTIONS;DENIES/REPORTS:21021675::"Denies"} Difficulty using hands for taps, buttons, cutlery, and/or writing: {ACTIONS;DENIES/REPORTS:21021675::"Denies"}  No Rheumatology ROS completed.   PMFS History:  Patient Active Problem List   Diagnosis Date Noted  . Hepatic cirrhosis ([place]) 05/23/2020  . LUQ pain 11/26/2019  . Encounter for gynecological examination with Papanicolaou smear of cervix 10/06/2019  . Routine cervical smear 10/06/2019  . Encounter for screening fecal occult blood testing 10/06/2019  . Uncontrolled type 2 diabetes mellitus with hyperglycemia ([place]) 09/27/2019  . Mixed hyperlipidemia 09/27/2019  . Morbid obesity ([place]) 09/27/2019  . Abnormal LFTs 03/16/2019  . Diabetes mellitus without complication ([place])   . Positive colorectal cancer screening using Cologuard test 12/24/2017  . Abdominal pain, epigastric 12/24/2017  . GERD (gastroesophageal reflux disease) 12/24/2017  . Fatty liver 12/24/2017    Past Medical History:  Diagnosis Date  . Arthritis   . Cardiac murmur   . Cervical spine disease   . Diabetes mellitus without complication (HCC)    diet controlled  . Exogenous hyperlipidemia   . History of kidney stones   . HTN (hypertension)   . Morbid obesity due to  excess calories ([place])   . Other chronic pain   . Panic disorder   . Patent foramen ovale    TEE 2018  . Prediabetes   . Sleep apnea   . Stroke [facility])    "mini-stroke" no deficits from this.  Marland Kitchen Unspecified asthma with (acute) exacerbation   . Vitamin D deficiency     Family History  Problem Relation Age of Onset  . Breast cancer Maternal Aunt   . Colon cancer Other        maternal great grandfather  . Liver disease Brother        Fatty liver  . HIV Brother   . Hypertension Mother   . Hyperlipidemia Mother   . Hypertension Father   . Thyroid disease Sister   . Pancreatic cancer Sister   . Healthy Daughter   . Healthy Son   . Healthy Son   . Heart disease Son    Past Surgical History:  Procedure Laterality Date  . BIOPSY  03/05/2018   Procedure: BIOPSY;  Surgeon: Daneil Dolin, MD;  [facility];  Service: Endoscopy;;  gastric  . CHOLECYSTECTOMY    . COLONOSCOPY WITH PROPOFOL N/A 03/05/2018   Dr. Gala Romney: Diverticulosis in the sigmoid and descending colon.  3 colon polyps removed, 2 hyperplastic and one sessile serrated polyp.  Next colonoscopy 3 years.  . ESOPHAGOGASTRODUODENOSCOPY (EGD) WITH PROPOFOL N/A 03/05/2018   Dr. Gala Romney: Erosive reflux esophagitis, small hiatal hernia, erosive gastropathy with reactive gastropathy on biopsy.  No H. pylori.  Marland Kitchen HERNIA REPAIR     umbilical  . POLYPECTOMY  03/05/2018   Procedure: POLYPECTOMY;  Surgeon: Daneil Dolin, MD;  [facility];  Service: Endoscopy;;  colon  . SHOULDER ARTHROSCOPY Right   . TEE WITHOUT CARDIOVERSION  2018   patent foramen ovale  . TONSILLECTOMY AND ADENOIDECTOMY    . TUBAL LIGATION     Social History   Social History Narrative  . Not on file    There is no immunization history on file for this patient.   Objective: Vital Signs: LMP 09/06/2016    Physical Exam Vitals and nursing note reviewed.  Constitutional:      Appearance: She is well-developed.  HENT:     Head:  Normocephalic and atraumatic.  Eyes:     Conjunctiva/sclera: Conjunctivae normal.  Pulmonary:     Effort: Pulmonary effort is normal.  Abdominal:     Palpations: Abdomen is soft.  Musculoskeletal:     Cervical back: Normal range of motion.  Skin:    General: Skin is warm and dry.     Capillary Refill: Capillary refill takes less than 2 seconds.  Neurological:     Mental Status: She is alert and oriented to person, place, and time.  Psychiatric:        Behavior: Behavior normal.     Musculoskeletal Exam: ***  CDAI Exam: CDAI Score: -- Patient Global: --; Provider Global: -- Swollen: --; Tender: -- Joint Exam 03/05/2021   No joint exam has been documented for this visit   There is currently no information documented on the homunculus. Go to the Rheumatology activity and complete the homunculus joint exam.  Investigation: No additional findings.  Imaging: No results found.  Recent Labs: Lab Results  Component Value Date   WBC 7.3 06/07/2020   HGB 14.2 06/07/2020   PLT 194 06/07/2020   NA 143 06/07/2020   K 4.5 06/07/2020   CL 105 06/07/2020   CO2 27 06/07/2020   GLUCOSE 126 (H) 06/07/2020   BUN 10 06/07/2020   CREATININE 0.79 06/07/2020   BILITOT 0.6 06/07/2020   ALKPHOS 103 02/15/2020   AST 33 06/07/2020   ALT 23 06/07/2020   PROT 6.5 06/07/2020   ALBUMIN 4.5 02/15/2020   CALCIUM 9.5 06/07/2020   GFRAA 85 02/15/2020    Speciality Comments: No specialty comments available.  Procedures:  No procedures performed Allergies: Asa [aspirin], Augmentin [amoxicillin-pot clavulanate], Levaquin [levofloxacin in d5w], and Lovenox [enoxaparin sodium]   Assessment / Plan:     Visit Diagnoses: Positive ANA (antinuclear antibody)  Abnormal LFTs  Chronic pain of both shoulders  Chronic pain of both hips  Chronic pain of both knees  DDD (degenerative disc disease), cervical  Chronic midline low back pain with bilateral sciatica  Vitamin D  deficiency  Diabetes mellitus without complication ([place])  Essential hypertension  Patent foramen ovale  Fatty liver  History of gastroesophageal reflux (GERD)  History of sleep apnea  History of asthma  Orders: No orders of the defined types were placed in this encounter.  No orders of the defined types were placed in this encounter.   Face-to-face time spent with patient was *** minutes. Greater than 50% of time was spent in counseling and coordination of care.  Follow-Up Instructions: No follow-ups on file.   Ofilia Neas, PA-C  Note - This record has been created using Dragon software.  Chart creation errors have been sought, but may not always  have been located. Such creation errors do not reflect on  the standard of medical care.

## 2021-02-21 ENCOUNTER — Telehealth: Payer: Self-pay

## 2021-02-21 ENCOUNTER — Ambulatory Visit (INDEPENDENT_AMBULATORY_CARE_PROVIDER_SITE_OTHER): Payer: Medicare Other | Admitting: Gastroenterology

## 2021-02-21 ENCOUNTER — Other Ambulatory Visit: Payer: Self-pay

## 2021-02-21 ENCOUNTER — Encounter: Payer: Self-pay | Admitting: Gastroenterology

## 2021-02-21 VITALS — BP 175/85 | HR 93 | Temp 97.1°F | Ht 63.0 in | Wt 240.0 lb

## 2021-02-21 DIAGNOSIS — K7460 Unspecified cirrhosis of liver: Secondary | ICD-10-CM

## 2021-02-21 DIAGNOSIS — Z86010 Personal history of colonic polyps: Secondary | ICD-10-CM | POA: Insufficient documentation

## 2021-02-21 DIAGNOSIS — K219 Gastro-esophageal reflux disease without esophagitis: Secondary | ICD-10-CM | POA: Diagnosis not present

## 2021-02-21 NOTE — Patient Instructions (Addendum)
We will arrange for you to have a colonoscopy and upper endoscopy in the near future with Dr. Gala Romney. I did verify that your family member will have to stay at the hospital the entire time while you are having your procedures.  1 day prior to procedures: Take one half dose of metformin (500 mg in the morning and evening). Day of procedure: Do not take any morning diabetes medications.  For cirrhosis: Please have blood work completed at [facility] We we will be screening for esophageal varices (swollen blood vessels in your esophagus) at the time of your upper endoscopy. Please have hepatitis A/B vaccination series completed.  I have provided a prescription for you. You will be due for repeat ultrasound in March 2023. Recommend 1-2# weight loss per week until ideal body weight through exercise & diet. Low fat/cholesterol diet.   Avoid sweets, sodas, fruit juices, sweetened beverages like tea, etc. Gradually increase exercise from 15 min daily up to 1 hr per day 5 days/week.   For reflux: Continue taking Aciphex 20 mg daily. Follow a GERD diet:  Avoid fried, fatty, greasy, spicy, citrus foods. Avoid caffeine and carbonated beverages. Avoid chocolate. Try eating 4-6 small meals a day rather than 3 large meals. Do not eat within 3 hours of laying down. Prop head of bed up on wood or bricks to create a 6 inch incline.  We will plan to follow-up with you in 6 months.  Do not hesitate to call if you have any questions or concerns prior to your next visit.   It was a pleasure meeting you today!  I hope you have a very happy Thanksgiving!  Aliene Altes, PA-C [facility]

## 2021-02-21 NOTE — Telephone Encounter (Signed)
Will call pt to schedule TCS/EGD w/Propofol ASA 3 with Dr. Gala Romney when January schedule is available.

## 2021-02-26 DIAGNOSIS — I10 Essential (primary) hypertension: Secondary | ICD-10-CM | POA: Diagnosis not present

## 2021-02-26 DIAGNOSIS — R252 Cramp and spasm: Secondary | ICD-10-CM | POA: Diagnosis not present

## 2021-02-26 DIAGNOSIS — M5412 Radiculopathy, cervical region: Secondary | ICD-10-CM | POA: Diagnosis not present

## 2021-02-26 DIAGNOSIS — F419 Anxiety disorder, unspecified: Secondary | ICD-10-CM | POA: Diagnosis not present

## 2021-02-26 DIAGNOSIS — I679 Cerebrovascular disease, unspecified: Secondary | ICD-10-CM | POA: Diagnosis not present

## 2021-02-26 DIAGNOSIS — M79601 Pain in right arm: Secondary | ICD-10-CM | POA: Diagnosis not present

## 2021-02-26 DIAGNOSIS — M545 Low back pain, unspecified: Secondary | ICD-10-CM | POA: Diagnosis not present

## 2021-02-26 DIAGNOSIS — I6990 Unspecified sequelae of unspecified cerebrovascular disease: Secondary | ICD-10-CM | POA: Diagnosis not present

## 2021-02-26 DIAGNOSIS — M542 Cervicalgia: Secondary | ICD-10-CM | POA: Diagnosis not present

## 2021-03-05 ENCOUNTER — Ambulatory Visit: Payer: Medicare Other | Admitting: Physician Assistant

## 2021-03-05 DIAGNOSIS — I10 Essential (primary) hypertension: Secondary | ICD-10-CM

## 2021-03-05 DIAGNOSIS — Z8719 Personal history of other diseases of the digestive system: Secondary | ICD-10-CM

## 2021-03-05 DIAGNOSIS — G8929 Other chronic pain: Secondary | ICD-10-CM

## 2021-03-05 DIAGNOSIS — E559 Vitamin D deficiency, unspecified: Secondary | ICD-10-CM

## 2021-03-05 DIAGNOSIS — R768 Other specified abnormal immunological findings in serum: Secondary | ICD-10-CM

## 2021-03-05 DIAGNOSIS — K760 Fatty (change of) liver, not elsewhere classified: Secondary | ICD-10-CM

## 2021-03-05 DIAGNOSIS — Z8709 Personal history of other diseases of the respiratory system: Secondary | ICD-10-CM

## 2021-03-05 DIAGNOSIS — E119 Type 2 diabetes mellitus without complications: Secondary | ICD-10-CM

## 2021-03-05 DIAGNOSIS — R7989 Other specified abnormal findings of blood chemistry: Secondary | ICD-10-CM

## 2021-03-05 DIAGNOSIS — Z8669 Personal history of other diseases of the nervous system and sense organs: Secondary | ICD-10-CM

## 2021-03-05 DIAGNOSIS — Q2112 Patent foramen ovale: Secondary | ICD-10-CM

## 2021-03-05 DIAGNOSIS — M5030 Other cervical disc degeneration, unspecified cervical region: Secondary | ICD-10-CM

## 2021-03-14 ENCOUNTER — Telehealth: Payer: Self-pay

## 2021-03-14 ENCOUNTER — Other Ambulatory Visit: Payer: Self-pay

## 2021-03-14 NOTE — Telephone Encounter (Signed)
Called pt to schedule TCS/EGD w/Propofol ASA 3 w/Dr. Gala Romney. She wants to wait until later in January d/t transportation. Advised pt we will call back when remainder of schedule is available.

## 2021-03-29 DIAGNOSIS — M13861 Other specified arthritis, right knee: Secondary | ICD-10-CM | POA: Diagnosis not present

## 2021-03-29 DIAGNOSIS — J449 Chronic obstructive pulmonary disease, unspecified: Secondary | ICD-10-CM | POA: Diagnosis not present

## 2021-03-29 DIAGNOSIS — E1165 Type 2 diabetes mellitus with hyperglycemia: Secondary | ICD-10-CM | POA: Diagnosis not present

## 2021-03-29 DIAGNOSIS — I10 Essential (primary) hypertension: Secondary | ICD-10-CM | POA: Diagnosis not present

## 2021-04-11 NOTE — Telephone Encounter (Signed)
Called pt to schedule TCS/EGD, she is unable to do procedure this month d/t not having anyone to take her. Advised her we will call back when more dates are available.

## 2021-04-18 NOTE — Telephone Encounter (Signed)
Called pt to schedule. She wants to wait until March/April to have procedure done d/t issues with transportation. Advised will call once we receive that schedule

## 2021-04-24 DIAGNOSIS — M79601 Pain in right arm: Secondary | ICD-10-CM | POA: Diagnosis not present

## 2021-04-24 DIAGNOSIS — R252 Cramp and spasm: Secondary | ICD-10-CM | POA: Diagnosis not present

## 2021-04-24 DIAGNOSIS — M545 Low back pain, unspecified: Secondary | ICD-10-CM | POA: Diagnosis not present

## 2021-04-24 DIAGNOSIS — I679 Cerebrovascular disease, unspecified: Secondary | ICD-10-CM | POA: Diagnosis not present

## 2021-04-24 DIAGNOSIS — Z79891 Long term (current) use of opiate analgesic: Secondary | ICD-10-CM | POA: Diagnosis not present

## 2021-04-24 DIAGNOSIS — I10 Essential (primary) hypertension: Secondary | ICD-10-CM | POA: Diagnosis not present

## 2021-04-24 DIAGNOSIS — M542 Cervicalgia: Secondary | ICD-10-CM | POA: Diagnosis not present

## 2021-04-24 DIAGNOSIS — M5412 Radiculopathy, cervical region: Secondary | ICD-10-CM | POA: Diagnosis not present

## 2021-04-24 DIAGNOSIS — M4802 Spinal stenosis, cervical region: Secondary | ICD-10-CM | POA: Diagnosis not present

## 2021-04-24 DIAGNOSIS — E612 Magnesium deficiency: Secondary | ICD-10-CM | POA: Diagnosis not present

## 2021-04-24 DIAGNOSIS — M47812 Spondylosis without myelopathy or radiculopathy, cervical region: Secondary | ICD-10-CM | POA: Diagnosis not present

## 2021-04-29 DIAGNOSIS — G8929 Other chronic pain: Secondary | ICD-10-CM | POA: Diagnosis not present

## 2021-04-29 DIAGNOSIS — I10 Essential (primary) hypertension: Secondary | ICD-10-CM | POA: Diagnosis not present

## 2021-05-29 DIAGNOSIS — E119 Type 2 diabetes mellitus without complications: Secondary | ICD-10-CM | POA: Diagnosis not present

## 2021-05-29 DIAGNOSIS — I10 Essential (primary) hypertension: Secondary | ICD-10-CM | POA: Diagnosis not present

## 2021-05-29 NOTE — Telephone Encounter (Signed)
Called pt, offered to schedule TCS/EGD but she wants to wait until April d/t transportation. Advised her we will call back when April schedule is available.

## 2021-06-07 ENCOUNTER — Telehealth: Payer: Self-pay

## 2021-06-07 NOTE — Telephone Encounter (Signed)
Called pt to schedule TCS/EGD w/Propofol ASA 3 w/Dr. Gala Romney. Available dates given. She will check with her transportation and call back. ?

## 2021-06-13 ENCOUNTER — Encounter: Payer: Self-pay | Admitting: *Deleted

## 2021-06-25 IMAGING — CT CT HEAD W/O CM
4 series · 16 of 47 positions shown, 18 images · non-contrast
Comparison: CT head 06/02/2017

CLINICAL DATA: Syncope. Fall with head injury. On anticoagulation.

EXAM:
CT HEAD WITHOUT CONTRAST
CT CERVICAL SPINE WITHOUT CONTRAST
TECHNIQUE: Multidetector CT imaging of the head and cervical spine was
performed following the standard protocol without intravenous
contrast. Multiplanar CT image reconstructions of the cervical spine
were also generated.

[Series 2: head w o · axial · 0.41mm/px · z∈[+28,+143]mm · 7 of 31 slices shown, 9 images]
[im 4/31  brain]
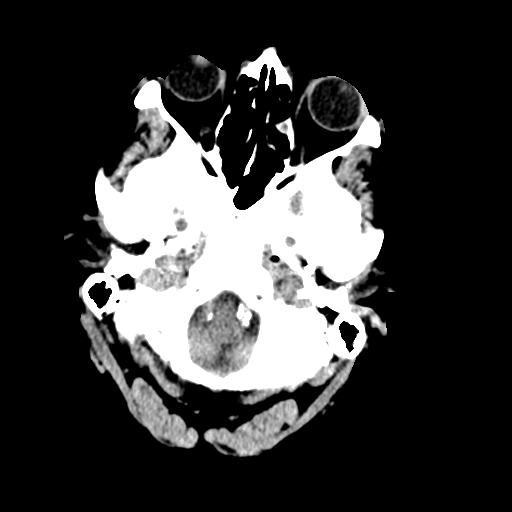
[im 4/31  bone]
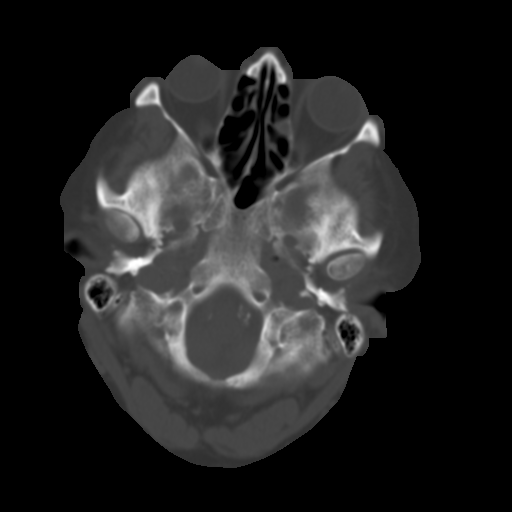
[im 8/31  brain]
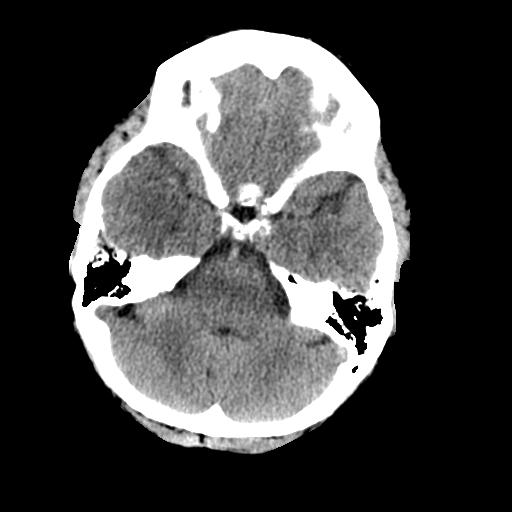
[im 12/31  brain]
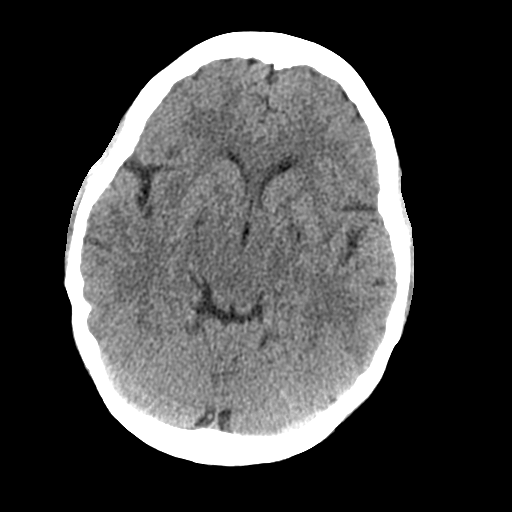
[im 16/31  brain]
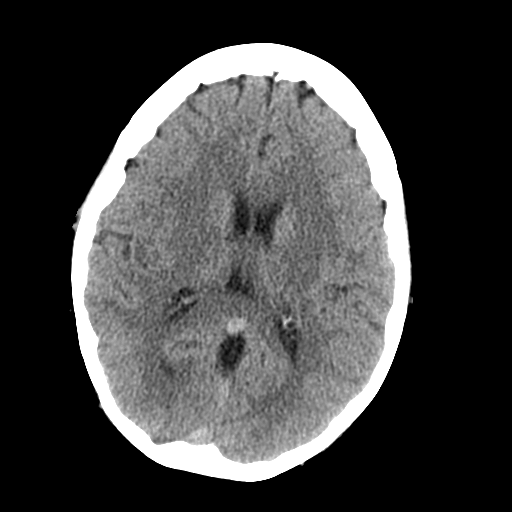
[im 19/31  brain]
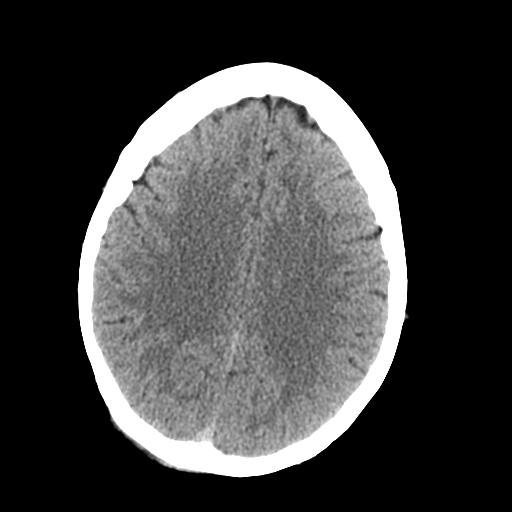
[im 19/31  bone]
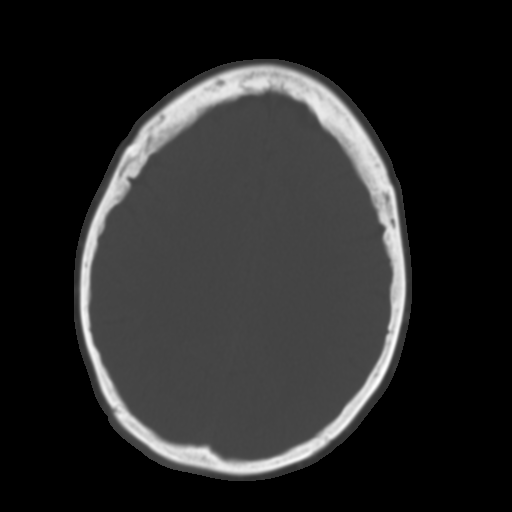
[im 23/31  brain]
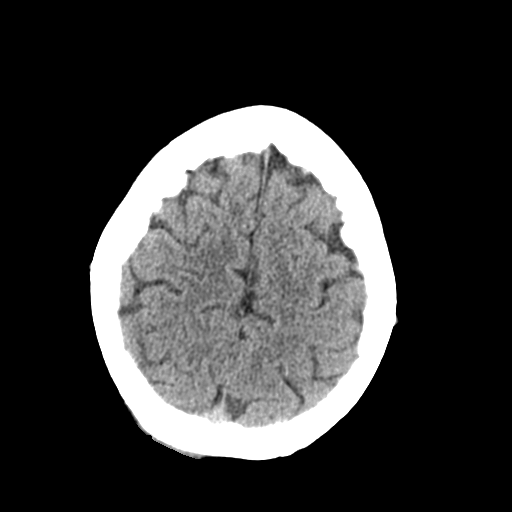
[im 27/31  brain]
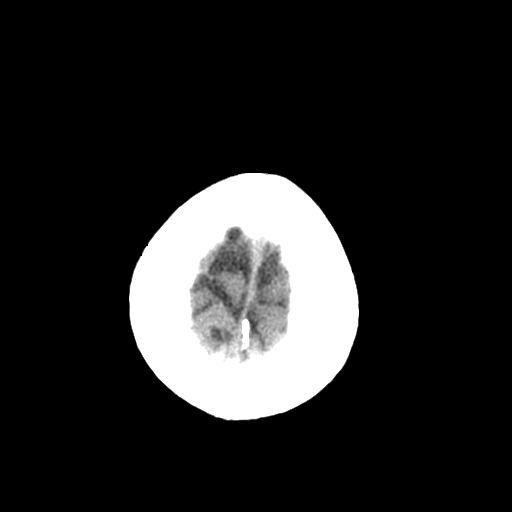

[Series 3: head bone · axial · 0.41mm/px · z∈[+27,+59]mm · 3 of 78 slices shown]
[im 8/78  bone]
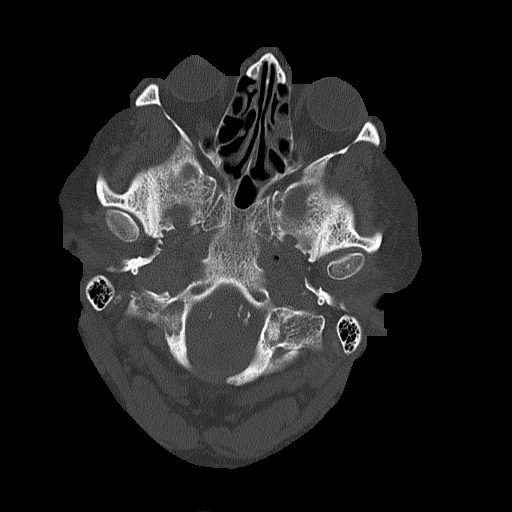
[im 16/78  bone]
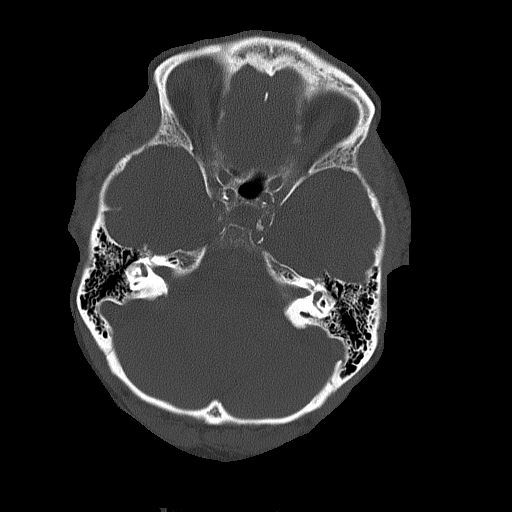
[im 24/78  bone]
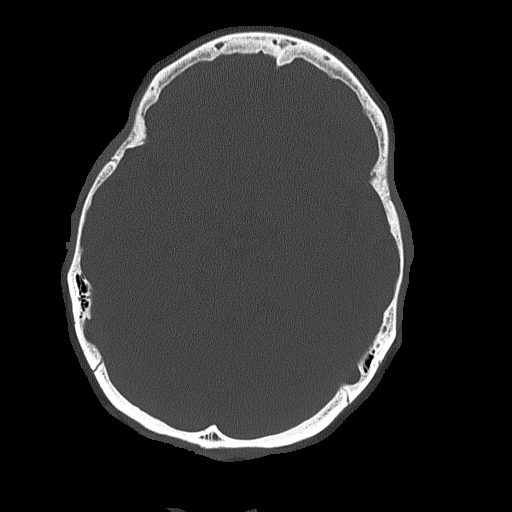

[Series 4: coronal soft · coronal · 0.31mm/px · 3 of 84 slices shown]
[im 28/84  brain]
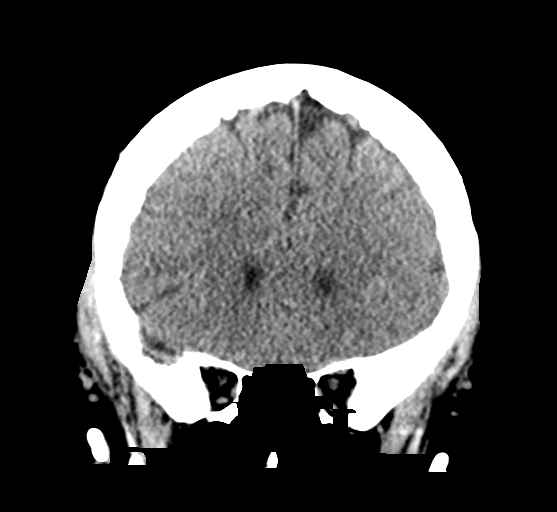
[im 37/84  brain]
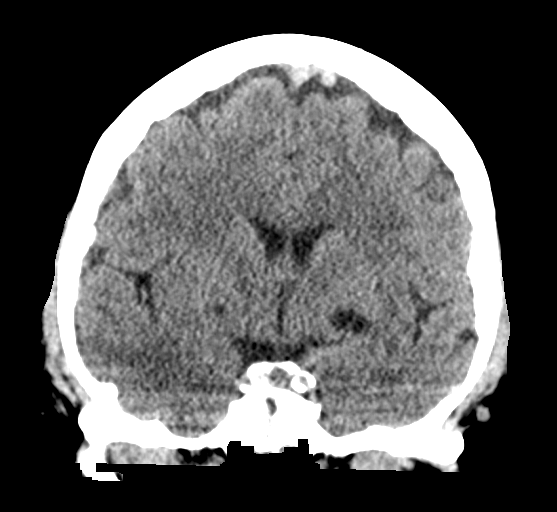
[im 47/84  brain]
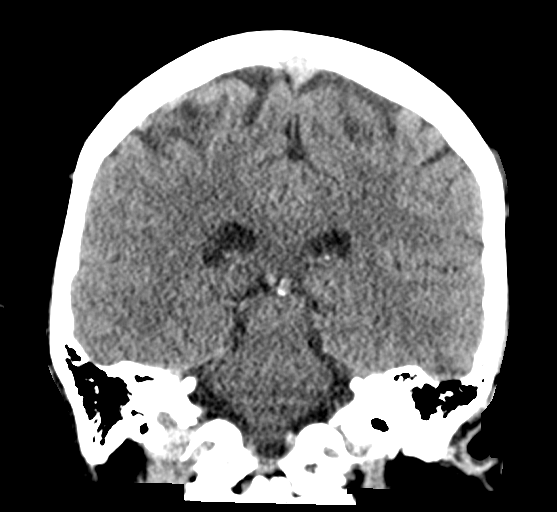

[Series 5: sagittal soft · sagittal · 0.36mm/px · 3 of 59 slices shown]
[im 20/59  brain]
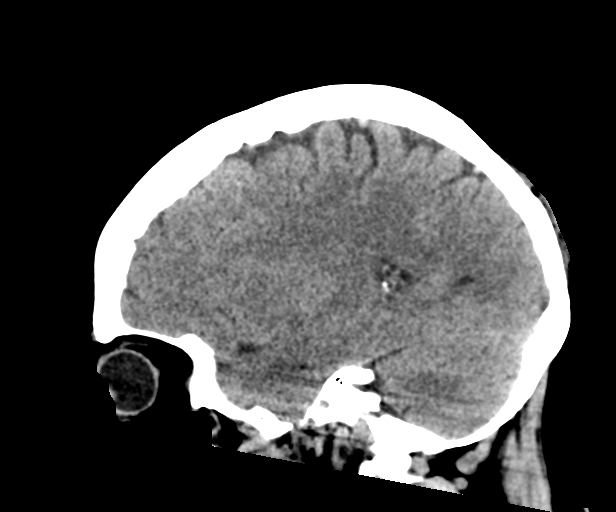
[im 30/59  brain]
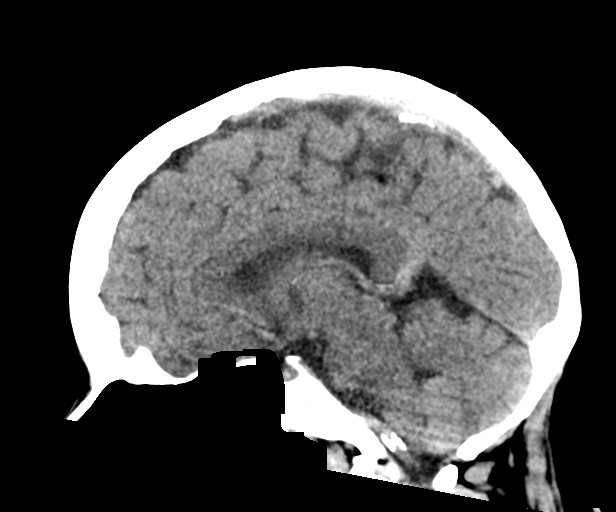
[im 39/59  brain]
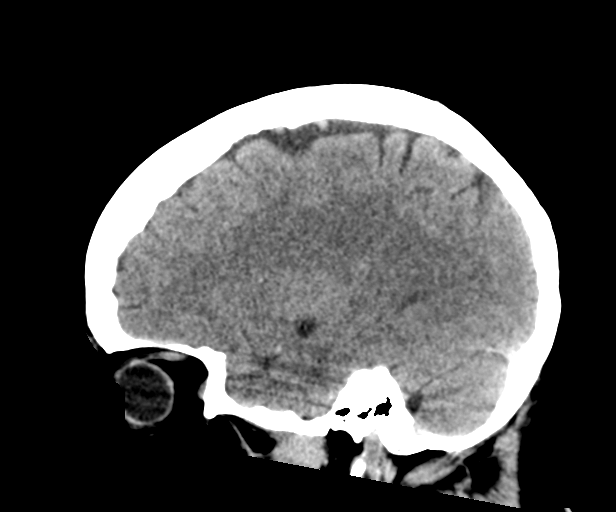

[16 of 47 positions shown; findings below may reference images not displayed]

FINDINGS: CT HEAD FINDINGS

Brain: No evidence of acute infarction, hemorrhage, hydrocephalus,
extra-axial collection or mass lesion/mass effect. Benign-appearing
cyst left basal ganglia appear unchanged

Vascular: Negative for hyperdense vessel

Skull: Posterior scalp hematoma on the right. Negative for skull
fracture.

Sinuses/Orbits: Negative

Other: None

CT CERVICAL SPINE FINDINGS

Alignment: Mild anterolisthesis C3-4 and C4-5. Cervical kyphosis.

Skull base and vertebrae: Negative for fracture

Soft tissues and spinal canal: Negative for soft tissue swelling or
mass. Atherosclerotic calcification in the carotid artery
bilaterally.

Disc levels: Multilevel disc degeneration and spurring most
prominent at C5-6 and C6-7 causing mild spinal stenosis.

Upper chest: Lung apices clear bilaterally

Other: None
IMPRESSION: 1. Posterior scalp hematoma on the right. No acute intracranial
abnormality.
2. Cervical spondylosis. Negative for cervical spine fracture.

## 2021-06-26 DIAGNOSIS — I10 Essential (primary) hypertension: Secondary | ICD-10-CM | POA: Diagnosis not present

## 2021-06-26 DIAGNOSIS — E119 Type 2 diabetes mellitus without complications: Secondary | ICD-10-CM | POA: Diagnosis not present

## 2021-07-10 ENCOUNTER — Telehealth: Payer: Self-pay

## 2021-07-10 NOTE — Telephone Encounter (Signed)
OV made for 6/7 at 0930 with [facility] and appt card mailed ?

## 2021-07-10 NOTE — Telephone Encounter (Signed)
Dr. Abbey Chatters doesn't have any morning procedures available 1st week of May. ?

## 2021-07-10 NOTE — Telephone Encounter (Signed)
Pt left message on voicemail that we have been trying to schedule TCS/EGD but she hasn't had transportation. Wendy Kramer she has someone that can take her 1st week of May. Dr. Gala Romney doesn't have any openings for ASA 3 TCS/EGD in May. ? ?[name], please schedule OV since her last OV was 02/21/21. ?

## 2021-07-10 NOTE — Telephone Encounter (Signed)
Does Dr. Abbey Chatters have any availabilities?  ?

## 2021-07-16 NOTE — Telephone Encounter (Signed)
Noted  

## 2021-07-17 DIAGNOSIS — M542 Cervicalgia: Secondary | ICD-10-CM | POA: Diagnosis not present

## 2021-07-17 DIAGNOSIS — M79601 Pain in right arm: Secondary | ICD-10-CM | POA: Diagnosis not present

## 2021-07-17 DIAGNOSIS — I679 Cerebrovascular disease, unspecified: Secondary | ICD-10-CM | POA: Diagnosis not present

## 2021-07-17 DIAGNOSIS — I6990 Unspecified sequelae of unspecified cerebrovascular disease: Secondary | ICD-10-CM | POA: Diagnosis not present

## 2021-07-17 DIAGNOSIS — I10 Essential (primary) hypertension: Secondary | ICD-10-CM | POA: Diagnosis not present

## 2021-07-17 DIAGNOSIS — M545 Low back pain, unspecified: Secondary | ICD-10-CM | POA: Diagnosis not present

## 2021-07-27 DIAGNOSIS — E119 Type 2 diabetes mellitus without complications: Secondary | ICD-10-CM | POA: Diagnosis not present

## 2021-07-27 DIAGNOSIS — I10 Essential (primary) hypertension: Secondary | ICD-10-CM | POA: Diagnosis not present

## 2021-08-01 ENCOUNTER — Ambulatory Visit: Payer: Medicare Other | Admitting: Nurse Practitioner

## 2021-08-06 ENCOUNTER — Other Ambulatory Visit (HOSPITAL_COMMUNITY): Payer: Self-pay | Admitting: Neurology

## 2021-08-06 DIAGNOSIS — M25561 Pain in right knee: Secondary | ICD-10-CM

## 2021-08-09 ENCOUNTER — Ambulatory Visit: Payer: Medicare Other | Admitting: Nurse Practitioner

## 2021-08-22 ENCOUNTER — Other Ambulatory Visit (HOSPITAL_COMMUNITY): Payer: Self-pay | Admitting: Neurology

## 2021-08-22 DIAGNOSIS — M25561 Pain in right knee: Secondary | ICD-10-CM | POA: Diagnosis not present

## 2021-08-22 DIAGNOSIS — M542 Cervicalgia: Secondary | ICD-10-CM | POA: Diagnosis not present

## 2021-08-22 DIAGNOSIS — M79601 Pain in right arm: Secondary | ICD-10-CM | POA: Diagnosis not present

## 2021-08-22 DIAGNOSIS — I6990 Unspecified sequelae of unspecified cerebrovascular disease: Secondary | ICD-10-CM | POA: Diagnosis not present

## 2021-08-22 DIAGNOSIS — M25551 Pain in right hip: Secondary | ICD-10-CM

## 2021-08-22 DIAGNOSIS — M545 Low back pain, unspecified: Secondary | ICD-10-CM

## 2021-08-22 DIAGNOSIS — I679 Cerebrovascular disease, unspecified: Secondary | ICD-10-CM | POA: Diagnosis not present

## 2021-08-22 DIAGNOSIS — Z79891 Long term (current) use of opiate analgesic: Secondary | ICD-10-CM | POA: Diagnosis not present

## 2021-08-24 ENCOUNTER — Ambulatory Visit (HOSPITAL_COMMUNITY)
Admission: RE | Admit: 2021-08-24 | Discharge: 2021-08-24 | Disposition: A | Discharge status: Home / Self Care | Payer: Medicare Other | Source: Ambulatory Visit | Attending: Neurology | Admitting: Neurology

## 2021-08-24 DIAGNOSIS — M5136 Other intervertebral disc degeneration, lumbar region: Secondary | ICD-10-CM | POA: Diagnosis not present

## 2021-08-24 DIAGNOSIS — M545 Low back pain, unspecified: Secondary | ICD-10-CM | POA: Diagnosis not present

## 2021-08-24 DIAGNOSIS — M25551 Pain in right hip: Secondary | ICD-10-CM | POA: Insufficient documentation

## 2021-08-24 DIAGNOSIS — S32010A Wedge compression fracture of first lumbar vertebra, initial encounter for closed fracture: Secondary | ICD-10-CM | POA: Diagnosis not present

## 2021-08-24 DIAGNOSIS — M25561 Pain in right knee: Secondary | ICD-10-CM | POA: Diagnosis not present

## 2021-08-26 DIAGNOSIS — F17200 Nicotine dependence, unspecified, uncomplicated: Secondary | ICD-10-CM | POA: Diagnosis not present

## 2021-08-26 DIAGNOSIS — I10 Essential (primary) hypertension: Secondary | ICD-10-CM | POA: Diagnosis not present

## 2021-08-31 ENCOUNTER — Ambulatory Visit: Payer: Medicare Other | Admitting: Nurse Practitioner

## 2021-08-31 NOTE — Patient Instructions (Incomplete)
Diabetes Mellitus and Foot Care Foot care is an important part of your health, especially when you have diabetes. Diabetes may cause you to have problems because of poor blood flow (circulation) to your feet and legs, which can cause your skin to: Become thinner and drier. Break more easily. Heal more slowly. Peel and crack. You may also have nerve damage (neuropathy) in your legs and feet, causing decreased feeling in them. This means that you may not notice minor injuries to your feet that could lead to more serious problems. Noticing and addressing any potential problems early is the best way to prevent future foot problems. How to care for your feet Foot hygiene  Wash your feet daily with warm water and mild soap. Do not use hot water. Then, pat your feet and the areas between your toes until they are completely dry. Do not soak your feet as this can dry your skin. Trim your toenails straight across. Do not dig under them or around the cuticle. File the edges of your nails with an emery board or nail file. Apply a moisturizing lotion or petroleum jelly to the skin on your feet and to dry, brittle toenails. Use lotion that does not contain alcohol and is unscented. Do not apply lotion between your toes. Shoes and socks Wear clean socks or stockings every day. Make sure they are not too tight. Do not wear knee-high stockings since they may decrease blood flow to your legs. Wear shoes that fit properly and have enough cushioning. Always look in your shoes before you put them on to be sure there are no objects inside. To break in new shoes, wear them for just a few hours a day. This prevents injuries on your feet. Wounds, scrapes, corns, and calluses  Check your feet daily for blisters, cuts, bruises, sores, and redness. If you cannot see the bottom of your feet, use a mirror or ask someone for help. Do not cut corns or calluses or try to remove them with medicine. If you find a minor scrape,  cut, or break in the skin on your feet, keep it and the skin around it clean and dry. You may clean these areas with mild soap and water. Do not clean the area with peroxide, alcohol, or iodine. If you have a wound, scrape, corn, or callus on your foot, look at it several times a day to make sure it is healing and not infected. Check for: Redness, swelling, or pain. Fluid or blood. Warmth. Pus or a bad smell. General tips Do not cross your legs. This may decrease blood flow to your feet. Do not use heating pads or hot water bottles on your feet. They may burn your skin. If you have lost feeling in your feet or legs, you may not know this is happening until it is too late. Protect your feet from hot and cold by wearing shoes, such as at the beach or on hot pavement. Schedule a complete foot exam at least once a year (annually) or more often if you have foot problems. Report any cuts, sores, or bruises to your health care provider immediately. Where to find more information American Diabetes Association: www.diabetes.org Association of Diabetes Care & Education Specialists: www.diabeteseducator.org Contact a health care provider if: You have a medical condition that increases your risk of infection and you have any cuts, sores, or bruises on your feet. You have an injury that is not healing. You have redness on your legs or feet. You   feel burning or tingling in your legs or feet. You have pain or cramps in your legs and feet. Your legs or feet are numb. Your feet always feel cold. You have pain around any toenails. Get help right away if: You have a wound, scrape, corn, or callus on your foot and: You have pain, swelling, or redness that gets worse. You have fluid or blood coming from the wound, scrape, corn, or callus. Your wound, scrape, corn, or callus feels warm to the touch. You have pus or a bad smell coming from the wound, scrape, corn, or callus. You have a fever. You have a red  line going up your leg. Summary Check your feet every day for blisters, cuts, bruises, sores, and redness. Apply a moisturizing lotion or petroleum jelly to the skin on your feet and to dry, brittle toenails. Wear shoes that fit properly and have enough cushioning. If you have foot problems, report any cuts, sores, or bruises to your health care provider immediately. Schedule a complete foot exam at least once a year (annually) or more often if you have foot problems. This information is not intended to replace advice given to you by your health care provider. Make sure you discuss any questions you have with your health care provider. Document Revised: 10/07/2019 Document Reviewed: 10/07/2019 Elsevier Patient Education  2023 Elsevier Inc.  

## 2021-09-05 ENCOUNTER — Ambulatory Visit: Payer: Medicare Other | Admitting: Gastroenterology

## 2021-09-09 IMAGING — US US CAROTID DUPLEX BILAT
1 series · 13 of 24 positions shown · non-contrast
Comparison: No prior duplex

CLINICAL DATA: 58-year-old female with a history of dizziness

EXAM:
BILATERAL CAROTID DUPLEX ULTRASOUND
TECHNIQUE: Gray scale imaging, color Doppler and duplex ultrasound were
performed of bilateral carotid and vertebral arteries in the neck.

[Series 1: us carotid duplex bilat · 13 of 68 slices shown]
[im 1/68]
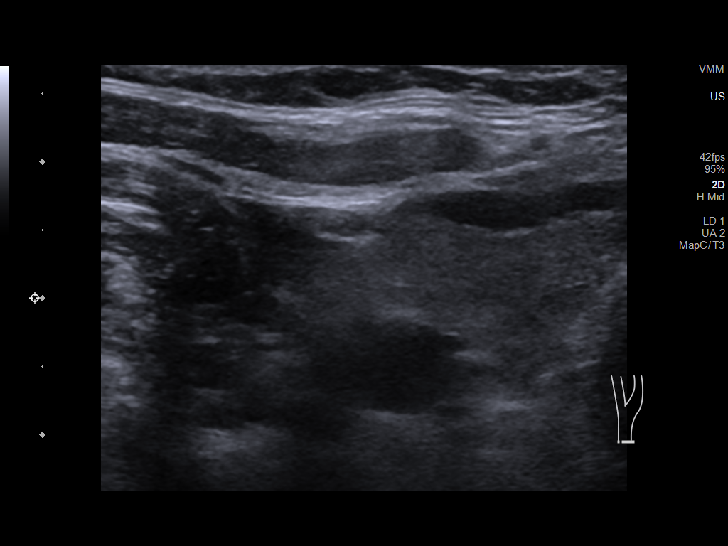
[im 6/68]
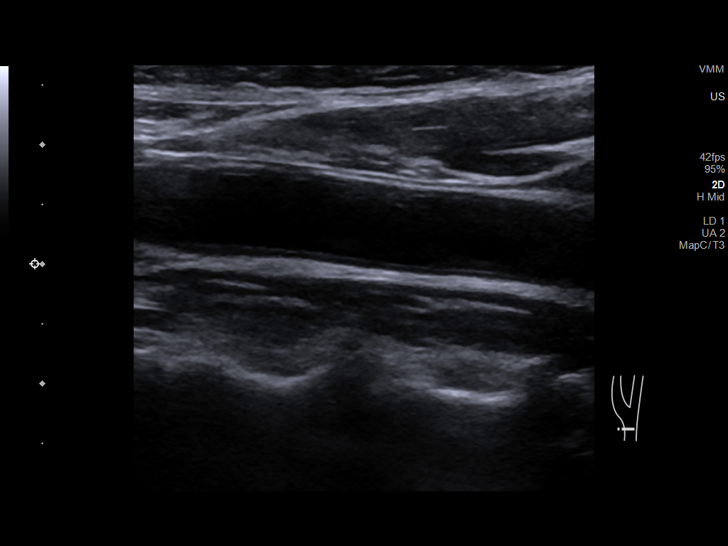
[im 12/68]
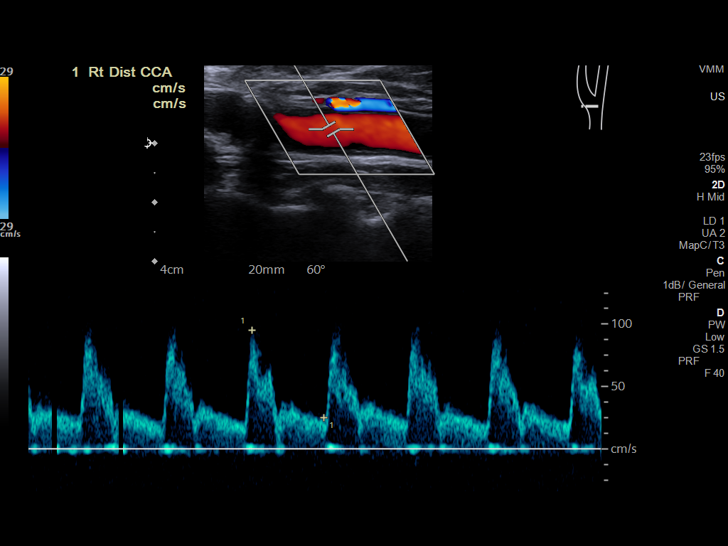
[im 18/68]
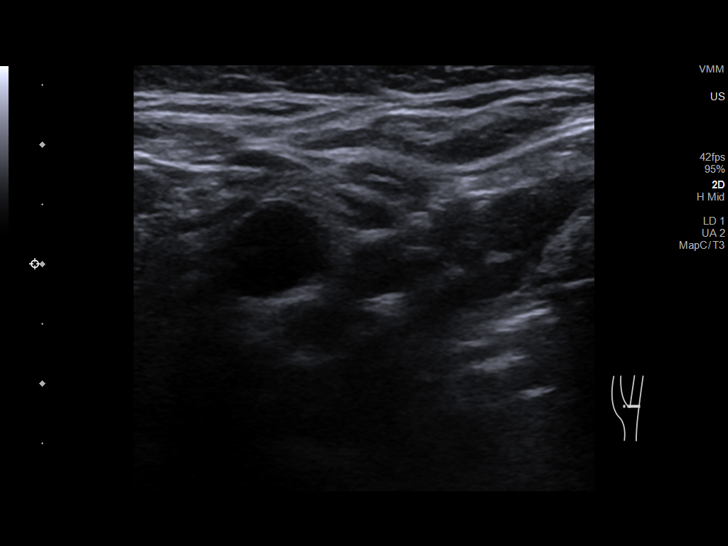
[im 24/68]
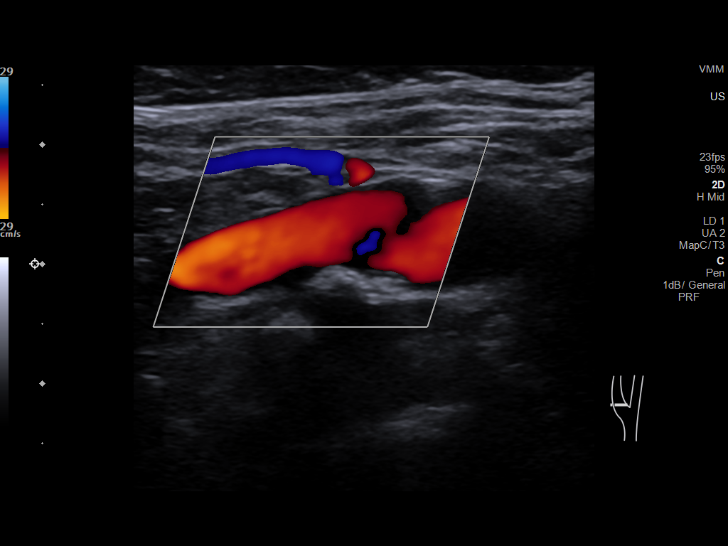
[im 30/68]
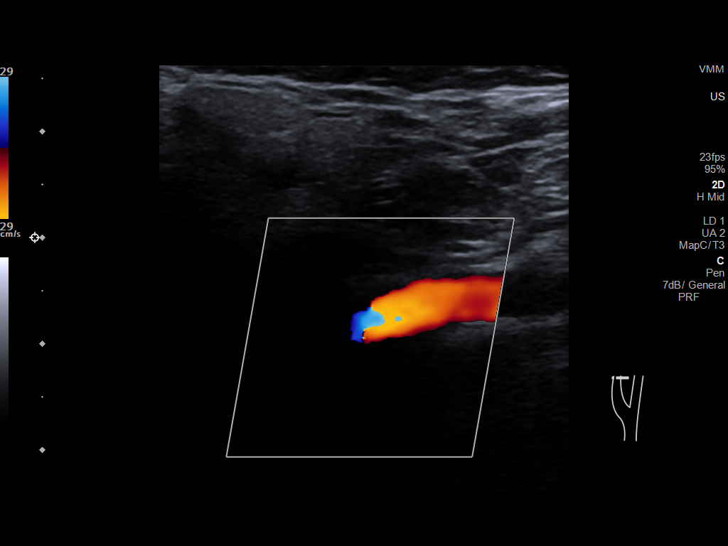
[im 35/68]
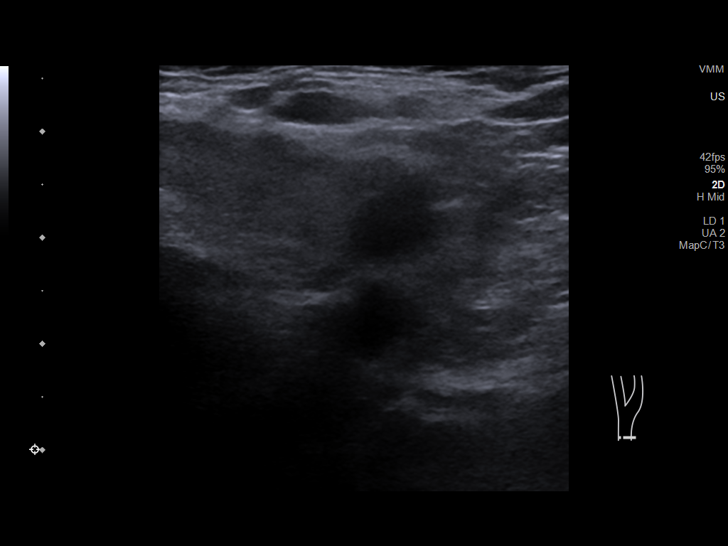
[im 38/68]
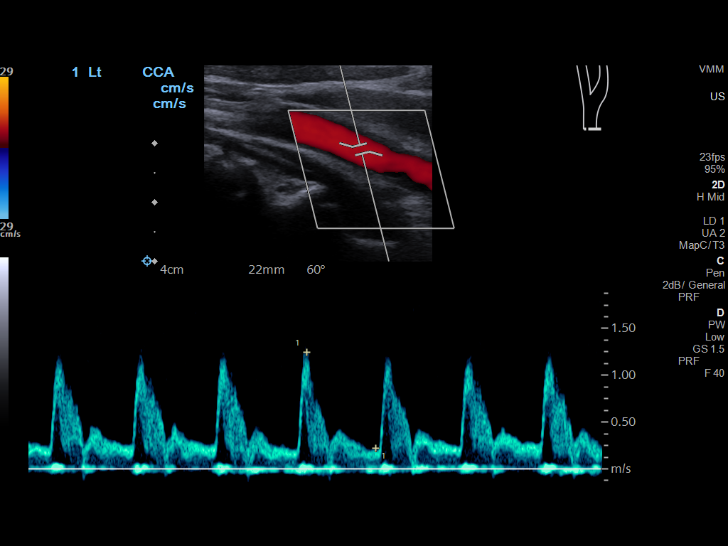
[im 44/68]
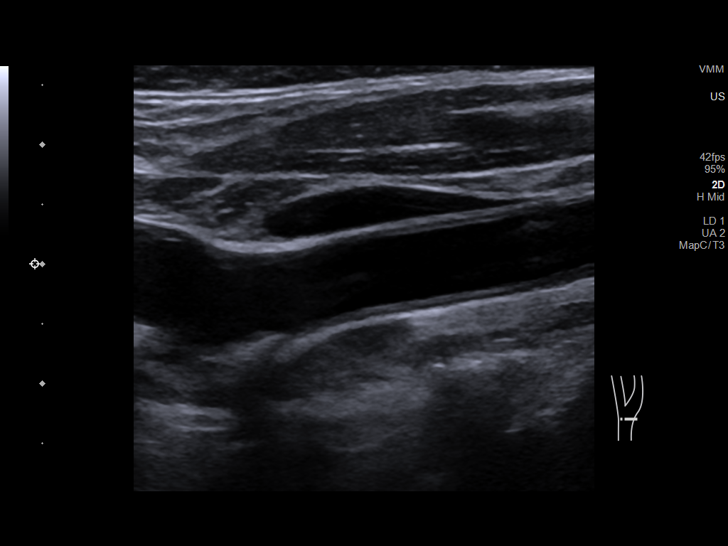
[im 50/68]
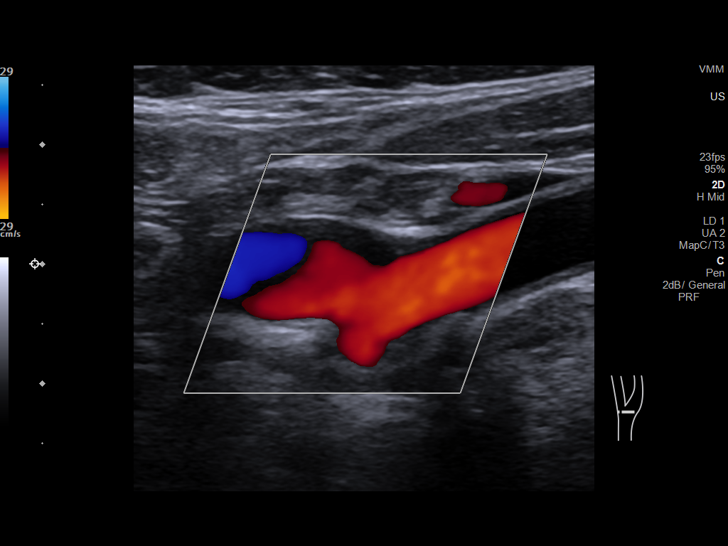
[im 56/68]
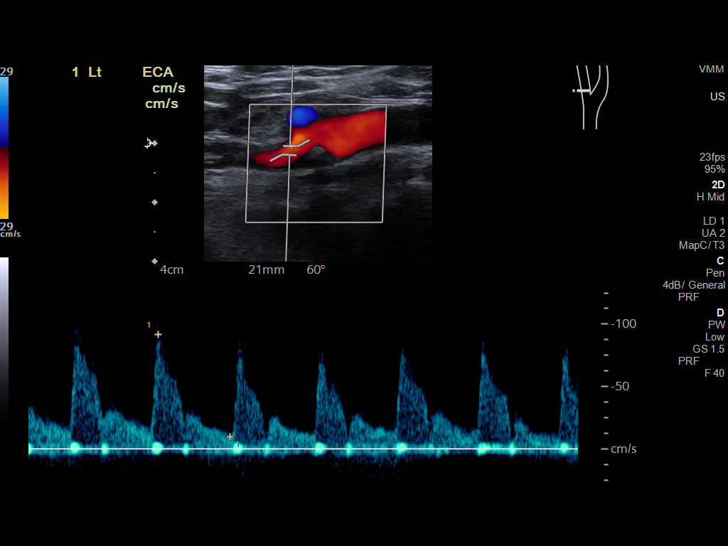
[im 62/68]
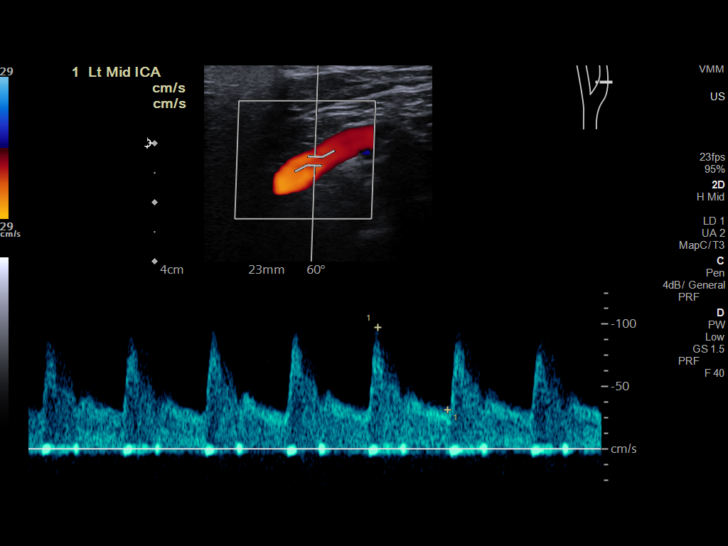
[im 68/68]
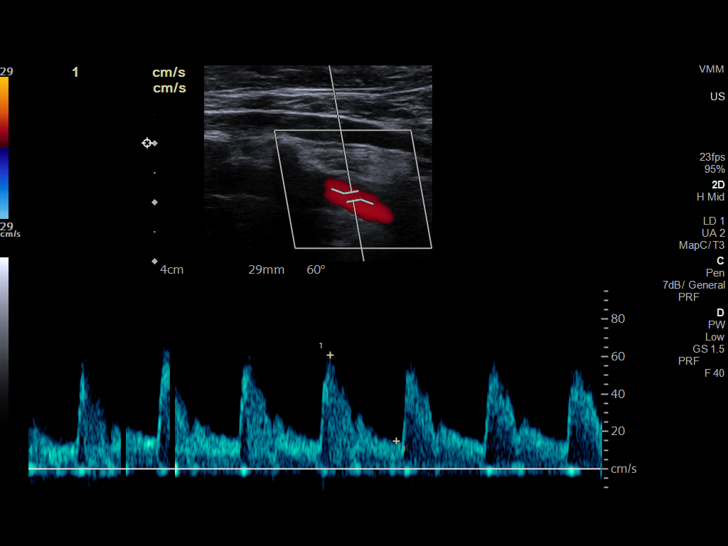

[13 of 24 positions shown; findings below may reference images not displayed]

FINDINGS: Criteria: Quantification of carotid stenosis is based on velocity
parameters that correlate the residual internal carotid diameter
with NASCET-based stenosis levels, using the diameter of the distal
internal carotid lumen as the denominator for stenosis measurement.

The following velocity measurements were obtained:

RIGHT

ICA:  Systolic 117 cm/sec, Diastolic 47 cm/sec

CCA:  106 cm/sec

SYSTOLIC ICA/CCA RATIO:

ECA:  184 cm/sec

LEFT

ICA:  Systolic 97 cm/sec, Diastolic 31 cm/sec

CCA:  120 cm/sec

SYSTOLIC ICA/CCA RATIO:

ECA:  91 cm/sec

Right Brachial SBP: Not acquired

Left Brachial SBP: Not acquired

RIGHT CAROTID ARTERY: No significant calcifications of the right
common carotid artery. Intermediate waveform maintained.
Heterogeneous and partially calcified plaque at the right carotid
bifurcation. No significant lumen shadowing. Low resistance waveform
of the right ICA. No significant tortuosity.

RIGHT VERTEBRAL ARTERY: Antegrade flow with low resistance waveform.

LEFT CAROTID ARTERY: No significant calcifications of the left
common carotid artery. Intermediate waveform maintained.
Heterogeneous and partially calcified plaque at the left carotid
bifurcation without significant lumen shadowing. Low resistance
waveform of the left ICA. No significant tortuosity.

LEFT VERTEBRAL ARTERY:  Antegrade flow with low resistance waveform.
IMPRESSION: Color duplex indicates minimal heterogeneous and calcified plaque,
with no hemodynamically significant stenosis by duplex criteria in
the extracranial cerebrovascular circulation.

## 2021-09-12 ENCOUNTER — Ambulatory Visit (INDEPENDENT_AMBULATORY_CARE_PROVIDER_SITE_OTHER): Payer: Medicare Other | Admitting: Internal Medicine

## 2021-09-12 ENCOUNTER — Telehealth: Payer: Self-pay | Admitting: *Deleted

## 2021-09-12 ENCOUNTER — Encounter: Payer: Self-pay | Admitting: Internal Medicine

## 2021-09-12 VITALS — BP 178/84 | HR 90 | Temp 97.3°F | Ht 63.0 in | Wt 234.8 lb

## 2021-09-12 DIAGNOSIS — R7989 Other specified abnormal findings of blood chemistry: Secondary | ICD-10-CM | POA: Diagnosis not present

## 2021-09-12 DIAGNOSIS — K7460 Unspecified cirrhosis of liver: Secondary | ICD-10-CM

## 2021-09-12 DIAGNOSIS — D509 Iron deficiency anemia, unspecified: Secondary | ICD-10-CM | POA: Diagnosis not present

## 2021-09-12 NOTE — Telephone Encounter (Signed)
Called pt and made aware of [place] appt details. She voiced understanding and had no questions.

## 2021-09-12 NOTE — Patient Instructions (Signed)
It was good to see you again today!  As discussed, need an EGD to check for varicose veins in your esophagus and a colonoscopy-history of polyps (ASA 3)  We will try and arrange the same prep as you had last time per your request  No metformin day before or day of procedure  Liver ultrasound to screen for hepatoma  CBC, Chem-12, INR and alpha-fetoprotein  Get hepatitis A and B vaccine as previously recommended  Continue Aciphex or rabeprazole 20 mg each morning before breakfast for GERD  Office visit with [place] in 4 months

## 2021-09-12 NOTE — Progress Notes (Signed)
Primary Care Physician:  Carrolyn Meiers, MD Primary Gastroenterologist:  Dr. Gala Romney  Pre-Procedure History & Physical: HPI:  Wendy Kramer is a 61 y.o. female here for follow-up of Karlene Lineman /cirrhosis, history of colonic polyps and GERD .  Recently sustained a mechanical fall and suffered compression fractures-spine per her report.  Worsening constipation with opioid therapy but that is improved.  GERD well controlled on rabeprazole 20 mg daily.  Has occasional alternating constipation with diarrhea but bowel function good most of the time.  Denies rectal bleeding.  History of serrated adenoma removed 2019/due/overdue for surveillance examination.  No stigmata of portal hypertension on prior EGD 2019; due for screening now for varices.  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   Unspecified asthma with (acute) exacerbation    Vitamin D deficiency     Past Surgical History:  Procedure Laterality Date   BIOPSY  03/05/2018   Procedure: BIOPSY;  Surgeon: Daneil Dolin, MD;  [facility]: [facility];  Service: Endoscopy;;  gastric   CHOLECYSTECTOMY     COLONOSCOPY WITH PROPOFOL N/A 03/05/2018   Dr. Gala Romney: Diverticulosis in the sigmoid and descending colon.  3 colon polyps removed, 2 hyperplastic and one sessile serrated polyp.  Next colonoscopy 3 years.   ESOPHAGOGASTRODUODENOSCOPY (EGD) WITH PROPOFOL N/A 03/05/2018   Dr. Gala Romney: Erosive reflux  esophagitis, small hiatal hernia, erosive gastropathy with reactive gastropathy on biopsy.  No H. pylori.   HERNIA REPAIR     umbilical   POLYPECTOMY  03/05/2018   Procedure: POLYPECTOMY;  Surgeon: Daneil Dolin, MD;  Location: [facility];  Service: Endoscopy;;  colon   SHOULDER ARTHROSCOPY Right    TEE WITHOUT CARDIOVERSION  2018   patent foramen ovale   TONSILLECTOMY AND ADENOIDECTOMY     TUBAL LIGATION      Prior to Admission medications   Medication Sig Start Date End Date Taking? Authorizing Provider  ACCU-CHEK GUIDE test strip USE TO CHECK BLOOD SUGAR DAILY 09/20/20  Yes Brita Romp, NP  Accu-Chek Softclix Lancets lancets Use as instructed to monitor glucose 1-2 times daily. 06/19/20  Yes Brita Romp, NP  acetaminophen (TYLENOL) 500 MG tablet Take 500 mg by mouth as needed.    Yes [provider]  albuterol (PROVENTIL HFA;VENTOLIN HFA) 108 (90 Base) MCG/ACT inhaler Inhale 1-2 puffs into the lungs as needed for wheezing or shortness of breath.    Yes [provider]  ALPRAZolam (XANAX) 0.25 MG tablet Take 0.25 mg by mouth daily as needed. 12/16/20  Yes [provider]  Blood Glucose Monitoring Suppl (ACCU-CHEK GUIDE ME) w/Device KIT 1 Piece by Does not apply route as directed. 11/02/19  Yes Nida, Marella Chimes, MD  budesonide-formoterol (SYMBICORT) 80-4.5 MCG/ACT inhaler Inhale 1 puff into the lungs 2 (two)  times daily.    Yes [provider]  cyclobenzaprine (FLEXERIL) 5 MG tablet Take 5 mg by mouth daily as needed for muscle spasms.  08/18/17  Yes [provider]  fluticasone (FLONASE) 50 MCG/ACT nasal spray Place 1 spray into both nostrils daily as needed for allergies or rhinitis.    Yes [provider]  HYDROcodone-acetaminophen (NORCO/VICODIN) 5-325 MG tablet Take 1 tablet by mouth daily as needed. 09/28/19  Yes [provider]  ibuprofen (ADVIL) 400 MG tablet Take 400 mg by mouth 2 (two) times daily.   Yes  [provider]  ipratropium-albuterol (DUONEB) 0.5-2.5 (3) MG/3ML SOLN SMARTSIG:1 Vial(s) Via Nebulizer Every 4-6 Hours PRN 01/05/21  Yes [provider]  lisinopril (ZESTRIL) 20 MG tablet Take 20 mg by mouth 2 (two) times daily.   Yes [provider]  metFORMIN (GLUCOPHAGE) 1000 MG tablet Take 1,000 mg by mouth 2 (two) times daily. 09/17/19  Yes [provider]  Multiple Vitamin (MULTIVITAMIN ADULT PO) Take 1 tablet by mouth daily.   Yes [provider]  RABEprazole (ACIPHEX) 20 MG tablet Take 1 tablet (20 mg total) by mouth daily before breakfast. 02/05/21  Yes Mahala Menghini, PA-C  traMADol (ULTRAM) 50 MG tablet Take 50 mg by mouth every 6 (six) hours as needed for moderate pain.  09/20/16  Yes [provider]  triamcinolone (KENALOG) 0.025 % cream Apply topically as needed. 11/16/19  Yes [provider]    Allergies as of 09/12/2021 - Review Complete 09/12/2021  Allergen Reaction Noted   Asa [aspirin] Anaphylaxis 09/24/2016   Augmentin [amoxicillin-pot clavulanate] Other (See Comments) 09/24/2016   Levaquin [levofloxacin in d5w] Itching and Other (See Comments) 09/24/2016   Lovenox [enoxaparin sodium] Other (See Comments) 09/24/2016    Family History  Problem Relation Age of Onset   Breast cancer Maternal Aunt    Colon cancer Other        maternal great grandfather   Liver disease Brother        Fatty liver   HIV Brother    Hypertension Mother    Hyperlipidemia Mother    Hypertension Father    Thyroid disease Sister    Pancreatic cancer Sister    Healthy Daughter    Healthy Son    Healthy Son    Heart disease Son     Social History   Socioeconomic History   Marital status: Legally Separated    Spouse name: Not on file   Number of children: Not on file   Years of education: Not on file   Highest education level: Not on file  Occupational History   Not on file  Tobacco Use   Smoking status: Every Day     Packs/day: 0.50    Years: 40.00    Total pack years: 20.00    Types: Cigarettes   Smokeless tobacco: Never  Vaping Use   Vaping Use: Never used  Substance and Sexual Activity   Alcohol use: No   Drug use: No   Sexual activity: Not Currently    Birth control/protection: Post-menopausal, Surgical    Comment: tubal  Other Topics Concern   Not on file  Social History Narrative   Not on file   Social Determinants of Health   Financial Resource Strain: High Risk (10/06/2019)   Overall Financial Resource Strain (CARDIA)    Difficulty of Paying Living Expenses: Hard  Food Insecurity: No Food Insecurity (10/06/2019)   Hunger Vital Sign    Worried About  Running Out of Food in the Last Year: Never true    [employer] in the Last Year: Never true  Transportation Needs: No Transportation Needs (10/06/2019)   PRAPARE - Hydrologist (Medical): No    Lack of Transportation (Non-Medical): No  Physical Activity: Inactive (10/06/2019)   Exercise Vital Sign    Days of Exercise per Week: 0 days    Minutes of Exercise per Session: 0 min  Stress: Stress Concern Present (10/06/2019)   [place]    Feeling of Stress : To some extent  Social Connections: Socially Isolated (10/06/2019)   Social Connection and Isolation Panel [NHANES]    Frequency of Communication with Friends and Family: More than three times a week    Frequency of Social Gatherings with Friends and Family: More than three times a week    Attends Religious Services: Never    Marine scientist or Organizations: No    Attends Archivist Meetings: Never    Marital Status: Separated  Intimate Partner Violence: Not At Risk (10/06/2019)   Humiliation, Afraid, Rape, and Kick questionnaire    Fear of Current or Ex-Partner: No    Emotionally Abused: No    Physically Abused: No    Sexually Abused: No    Review of Systems: See HPI,  otherwise negative ROS  Physical Exam: BP (!) 178/84 (BP Location: Right Arm, Patient Position: Sitting, Cuff Size: Normal)   Pulse 90   Temp (!) 97.3 F (36.3 C) (Temporal)   Ht _0  (1.6 m)   Wt 234 lb 12.8 oz (106.5 kg)   LMP 09/06/2016   SpO2 95%   BMI 41.59 kg/m  General:   Alert, somewhat disheveled, alert conversant pleasant.  Skin:  Intact without significant lesions or rashes. No cutaneous stigmata of chronic liver disease tattoo left forearm Eyes:  Sclera clear, no icterus.   Conjunctiva pink. Lungs:  Clear throughout to auscultation.   No wheezes, crackles, or rhonchi. No acute distress. Heart:  Regular rate and rhythm; no murmurs, clicks, rubs,  or gallops. Abdomen: Centrally obese.  Positive bowel sounds soft nontender without appreciable mass organomegaly no obvious ascites.   Pulses:  Normal pulses noted. Extremities:  Without clubbing or edema.  Impression/Plan: 61 year old lady with Karlene Lineman cirrhosis in the setting of obesity and diabetes here for follow-up.  History of GERD.  History of serrated colonic polyps-somewhat overdue for surveillance colonoscopy.  Patient somewhat noncompliant.  Clinically doing well from a GI standpoint.  She needs tighter control of blood sugars.  And work on exercise.  She is due for a screening EGD, surveillance colonoscopy along with hepatic ultrasound and updated labs.  She needs vaccination against hepatitis B and A..  Recommendations:  As discussed, need an EGD to check for varicose veins in your esophagus and a colonoscopy-history of polyps (ASA 3).  The risks, benefits, limitations, imponderables and alternatives regarding both EGD and colonoscopy have been reviewed with the patient. Questions have been answered. All parties agreeable.    We will try and arrange the same prep as you had last time per your request  No metformin day before or day of procedure  Liver ultrasound to screen for hepatoma  CBC, Chem-12, INR and  alpha-fetoprotein  Get hepatitis A and B vaccine as previously recommended  Continue Aciphex or rabeprazole 20 mg each morning before breakfast for GERD  Regular aerobic exercise 3 times a  week is recommended.  Regular coffee consumption may be somewhat protective to your liver and is recommended (1 to 2 cups daily)  Office visit with [place] in 4 months     Notice: This dictation was prepared with Dragon dictation along with smaller phrase technology. Any transcriptional errors that result from this process are unintentional and may not be corrected upon review.

## 2021-09-18 ENCOUNTER — Other Ambulatory Visit (HOSPITAL_COMMUNITY): Payer: Self-pay | Admitting: Internal Medicine

## 2021-09-18 ENCOUNTER — Other Ambulatory Visit (HOSPITAL_COMMUNITY): Payer: Self-pay | Admitting: Emergency Medicine

## 2021-09-18 DIAGNOSIS — W19XXXA Unspecified fall, initial encounter: Secondary | ICD-10-CM

## 2021-09-18 DIAGNOSIS — Z1231 Encounter for screening mammogram for malignant neoplasm of breast: Secondary | ICD-10-CM

## 2021-09-18 DIAGNOSIS — F17210 Nicotine dependence, cigarettes, uncomplicated: Secondary | ICD-10-CM | POA: Diagnosis not present

## 2021-09-18 DIAGNOSIS — Z1331 Encounter for screening for depression: Secondary | ICD-10-CM | POA: Diagnosis not present

## 2021-09-18 DIAGNOSIS — I10 Essential (primary) hypertension: Secondary | ICD-10-CM | POA: Diagnosis not present

## 2021-09-18 DIAGNOSIS — I700 Atherosclerosis of aorta: Secondary | ICD-10-CM | POA: Diagnosis not present

## 2021-09-18 DIAGNOSIS — E1165 Type 2 diabetes mellitus with hyperglycemia: Secondary | ICD-10-CM | POA: Diagnosis not present

## 2021-09-18 DIAGNOSIS — Z1389 Encounter for screening for other disorder: Secondary | ICD-10-CM | POA: Diagnosis not present

## 2021-09-24 ENCOUNTER — Ambulatory Visit (HOSPITAL_COMMUNITY)
Admission: RE | Admit: 2021-09-24 | Discharge: 2021-09-24 | Disposition: A | Discharge status: Home / Self Care | Payer: Medicare Other | Source: Ambulatory Visit | Attending: Internal Medicine | Admitting: Internal Medicine

## 2021-09-24 DIAGNOSIS — R7989 Other specified abnormal findings of blood chemistry: Secondary | ICD-10-CM | POA: Diagnosis not present

## 2021-09-24 DIAGNOSIS — D509 Iron deficiency anemia, unspecified: Secondary | ICD-10-CM | POA: Diagnosis not present

## 2021-09-24 DIAGNOSIS — K7460 Unspecified cirrhosis of liver: Secondary | ICD-10-CM | POA: Diagnosis not present

## 2021-09-24 DIAGNOSIS — E559 Vitamin D deficiency, unspecified: Secondary | ICD-10-CM | POA: Diagnosis not present

## 2021-09-24 DIAGNOSIS — E119 Type 2 diabetes mellitus without complications: Secondary | ICD-10-CM | POA: Diagnosis not present

## 2021-09-24 DIAGNOSIS — Z1159 Encounter for screening for other viral diseases: Secondary | ICD-10-CM | POA: Diagnosis not present

## 2021-09-24 DIAGNOSIS — Z79899 Other long term (current) drug therapy: Secondary | ICD-10-CM | POA: Diagnosis not present

## 2021-09-24 DIAGNOSIS — I10 Essential (primary) hypertension: Secondary | ICD-10-CM | POA: Diagnosis not present

## 2021-09-24 LAB — BASIC METABOLIC PANEL: Glucose: 176

## 2021-09-24 LAB — COMPREHENSIVE METABOLIC PANEL: eGFR: 92

## 2021-09-24 LAB — HEMOGLOBIN A1C: Hemoglobin A1C: 9

## 2021-09-24 LAB — MICROALBUMIN / CREATININE URINE RATIO: Microalb Creat Ratio: 8

## 2021-09-24 LAB — HEPATIC FUNCTION PANEL
ALT: 30 U/L (ref 7–35)
AST: 46 — AB (ref 13–35)

## 2021-09-25 ENCOUNTER — Encounter: Payer: Self-pay | Admitting: Nurse Practitioner

## 2021-09-25 ENCOUNTER — Ambulatory Visit (INDEPENDENT_AMBULATORY_CARE_PROVIDER_SITE_OTHER): Payer: Medicare Other | Admitting: Nurse Practitioner

## 2021-09-25 VITALS — BP 166/80 | HR 79 | Ht 63.0 in | Wt 233.0 lb

## 2021-09-25 DIAGNOSIS — E782 Mixed hyperlipidemia: Secondary | ICD-10-CM

## 2021-09-25 DIAGNOSIS — E1165 Type 2 diabetes mellitus with hyperglycemia: Secondary | ICD-10-CM

## 2021-09-25 LAB — COMPREHENSIVE METABOLIC PANEL
AG Ratio: 1.4 (calc) (ref 1.0–2.5)
ALT: 29 U/L (ref 6–29)
AST: 46 U/L — ABNORMAL HIGH (ref 10–35)
Albumin: 3.9 g/dL (ref 3.6–5.1)
Alkaline phosphatase (APISO): 104 U/L (ref 37–153)
BUN: 13 mg/dL (ref 7–25)
CO2: 24 mmol/L (ref 20–32)
Calcium: 9.4 mg/dL (ref 8.6–10.4)
Chloride: 103 mmol/L (ref 98–110)
Creat: 0.74 mg/dL (ref 0.50–1.05)
Globulin: 2.7 g/dL (calc) (ref 1.9–3.7)
Glucose, Bld: 173 mg/dL — ABNORMAL HIGH (ref 65–99)
Potassium: 4.5 mmol/L (ref 3.5–5.3)
Sodium: 137 mmol/L (ref 135–146)
Total Bilirubin: 0.5 mg/dL (ref 0.2–1.2)
Total Protein: 6.6 g/dL (ref 6.1–8.1)

## 2021-09-25 LAB — AFP TUMOR MARKER: AFP-Tumor Marker: 3.1 ng/mL

## 2021-09-25 LAB — PROTIME-INR
INR: 0.9
Prothrombin Time: 9.8 s (ref 9.0–11.5)

## 2021-09-25 MED ORDER — METFORMIN HCL ER 500 MG PO TB24: 1000.0000 mg | ORAL_TABLET | Freq: Every day | ORAL | 3 refills | Status: DC

## 2021-09-27 ENCOUNTER — Telehealth: Payer: Self-pay | Admitting: *Deleted

## 2021-09-27 MED ORDER — NA SULFATE-K SULFATE-MG SULF 17.5-3.13-1.6 GM/177ML PO SOLN: 1.0000 | Freq: Once | ORAL | 0 refills | Status: AC

## 2021-09-27 NOTE — Telephone Encounter (Signed)
Patient called in requesting her lab results. Please advise thanks

## 2021-09-27 NOTE — Telephone Encounter (Signed)
Called pt. Scheduled for TCS/EGD with Dr. Gala Romney ASA 3 on 8/31 at 7:30am. Aware will mail prep instructions/pre-op appt. Rx sent to pharmacy. She wants same prep as last time (suprep)

## 2021-09-28 ENCOUNTER — Encounter: Payer: Self-pay | Admitting: *Deleted

## 2021-09-28 LAB — LIPID PANEL
LDL Cholesterol: 127
Triglycerides: 161 — AB (ref 40–160)

## 2021-10-01 ENCOUNTER — Encounter: Payer: Self-pay | Admitting: Internal Medicine

## 2021-10-01 ENCOUNTER — Encounter: Payer: Self-pay | Admitting: Nurse Practitioner

## 2021-10-04 DIAGNOSIS — M545 Low back pain, unspecified: Secondary | ICD-10-CM | POA: Diagnosis not present

## 2021-10-04 DIAGNOSIS — M542 Cervicalgia: Secondary | ICD-10-CM | POA: Diagnosis not present

## 2021-10-04 DIAGNOSIS — I10 Essential (primary) hypertension: Secondary | ICD-10-CM | POA: Diagnosis not present

## 2021-10-04 DIAGNOSIS — I6990 Unspecified sequelae of unspecified cerebrovascular disease: Secondary | ICD-10-CM | POA: Diagnosis not present

## 2021-10-04 DIAGNOSIS — M25561 Pain in right knee: Secondary | ICD-10-CM | POA: Diagnosis not present

## 2021-10-04 DIAGNOSIS — I679 Cerebrovascular disease, unspecified: Secondary | ICD-10-CM | POA: Diagnosis not present

## 2021-10-04 DIAGNOSIS — M79601 Pain in right arm: Secondary | ICD-10-CM | POA: Diagnosis not present

## 2021-10-04 DIAGNOSIS — Z79891 Long term (current) use of opiate analgesic: Secondary | ICD-10-CM | POA: Diagnosis not present

## 2021-10-08 ENCOUNTER — Ambulatory Visit (HOSPITAL_COMMUNITY)
Admission: RE | Admit: 2021-10-08 | Discharge: 2021-10-08 | Disposition: A | Discharge status: Home / Self Care | Payer: Medicare Other | Source: Ambulatory Visit | Attending: Internal Medicine | Admitting: Internal Medicine

## 2021-10-08 DIAGNOSIS — Z13820 Encounter for screening for osteoporosis: Secondary | ICD-10-CM | POA: Insufficient documentation

## 2021-10-08 DIAGNOSIS — M069 Rheumatoid arthritis, unspecified: Secondary | ICD-10-CM | POA: Insufficient documentation

## 2021-10-08 DIAGNOSIS — W19XXXA Unspecified fall, initial encounter: Secondary | ICD-10-CM

## 2021-10-08 DIAGNOSIS — F17200 Nicotine dependence, unspecified, uncomplicated: Secondary | ICD-10-CM | POA: Insufficient documentation

## 2021-10-08 DIAGNOSIS — S32019A Unspecified fracture of first lumbar vertebra, initial encounter for closed fracture: Secondary | ICD-10-CM | POA: Insufficient documentation

## 2021-10-08 DIAGNOSIS — Z1231 Encounter for screening mammogram for malignant neoplasm of breast: Secondary | ICD-10-CM | POA: Diagnosis not present

## 2021-10-08 DIAGNOSIS — Z780 Asymptomatic menopausal state: Secondary | ICD-10-CM | POA: Insufficient documentation

## 2021-10-08 DIAGNOSIS — Z720 Tobacco use: Secondary | ICD-10-CM | POA: Insufficient documentation

## 2021-10-09 NOTE — Telephone Encounter (Signed)
Pt was made aware about lab result that she was inquiring about through a pt message through myChart. Informed pt that Dr. Gala Romney was out last week and that the result she was inquiring about was in normal range and that he would address her other labs upon his return. Pt verbalized understanding.

## 2021-10-11 ENCOUNTER — Ambulatory Visit: Payer: Medicare Other | Admitting: Orthopedic Surgery

## 2021-10-18 DIAGNOSIS — E1165 Type 2 diabetes mellitus with hyperglycemia: Secondary | ICD-10-CM | POA: Diagnosis not present

## 2021-10-18 DIAGNOSIS — I10 Essential (primary) hypertension: Secondary | ICD-10-CM | POA: Diagnosis not present

## 2021-11-15 ENCOUNTER — Other Ambulatory Visit: Payer: Self-pay | Admitting: Nurse Practitioner

## 2021-11-15 DIAGNOSIS — E1165 Type 2 diabetes mellitus with hyperglycemia: Secondary | ICD-10-CM

## 2021-11-22 NOTE — Patient Instructions (Signed)
Janila Arrazola  [birth year]     '@PREFPERIOPPHARMACY'$ @   Your procedure is scheduled on  11/29/2021.   Report to [facility] at  0600 A.M.   Call this number if you have problems the morning of surgery:  [phone]   Remember:  Follow the diet and prep instructions given to you by the office.    Use your nebulizer and your inhaler before you come and bring your rescue inhaler with you.      DO NOT take any medications for diabetes the morning of your procedure.     Take these medicines the morning of surgery with A SIP OF WATER           xanax(if needed), flexeril(if needed), norco or ultram (if needed), aciphex,     Do not wear jewelry, make-up or nail polish.  Do not wear lotions, powders, or perfumes, or deodorant.  Do not shave 48 hours prior to surgery.  Men may shave face and neck.  Do not bring valuables to the hospital.  [facility] is not responsible for any belongings or valuables.  Contacts, dentures or bridgework may not be worn into surgery.  Leave your suitcase in the car.  After surgery it may be brought to your room.  For patients admitted to the hospital, discharge time will be determined by your treatment team.  Patients discharged the day of surgery will not be allowed to drive home and must have someone with them for 24 hours.    Special instructions:   DO NOT smoke tobacco or vape for 24 hours before your procedure.  Please read over the following fact sheets that you were given. Anesthesia Post-op Instructions and Care and Recovery After Surgery      Colonoscopy, Adult, Care After The following information offers guidance on how to care for yourself after your procedure. Your health care provider may also give you more specific instructions. If you have problems or questions, contact your health care provider. What can I expect after the procedure? After the procedure, it is common to have: A small amount of blood in your stool for 24  hours after the procedure. Some gas. Mild cramping or bloating of your abdomen. Follow these instructions at home: Eating and drinking  Drink enough fluid to keep your urine pale yellow. Follow instructions from your health care provider about eating or drinking restrictions. Resume your normal diet as told by your health care provider. Avoid heavy or fried foods that are hard to digest. Activity Rest as told by your health care provider. Avoid sitting for a long time without moving. Get up to take short walks every 1-2 hours. This is important to improve blood flow and breathing. Ask for help if you feel weak or unsteady. Return to your normal activities as told by your health care provider. Ask your health care provider what activities are safe for you. Managing cramping and bloating  Try walking around when you have cramps or feel bloated. If directed, apply heat to your abdomen as told by your health care provider. Use the heat source that your health care provider recommends, such as a moist heat pack or a heating pad. Place a towel between your skin and the heat source. Leave the heat on for 20-30 minutes. Remove the heat if your skin turns bright red. This is especially important if you are unable to feel pain, heat, or cold. You have a greater risk of getting burned.  General instructions If you were given a sedative during the procedure, it can affect you for several hours. Do not drive or operate machinery until your health care provider says that it is safe. For the first 24 hours after the procedure: Do not sign important documents. Do not drink alcohol. Do your regular daily activities at a slower pace than normal. Eat soft foods that are easy to digest. Take over-the-counter and prescription medicines only as told by your health care provider. Keep all follow-up visits. This is important. Contact a health care provider if: You have blood in your stool 2-3 days after the  procedure. Get help right away if: You have more than a small spotting of blood in your stool. You have large blood clots in your stool. You have swelling of your abdomen. You have nausea or vomiting. You have a fever. You have increasing pain in your abdomen that is not relieved with medicine. These symptoms may be an emergency. Get help right away. Call 911. Do not wait to see if the symptoms will go away. Do not drive yourself to the hospital. Summary After the procedure, it is common to have a small amount of blood in your stool. You may also have mild cramping and bloating of your abdomen. If you were given a sedative during the procedure, it can affect you for several hours. Do not drive or operate machinery until your health care provider says that it is safe. Get help right away if you have a lot of blood in your stool, nausea or vomiting, a fever, or increased pain in your abdomen. This information is not intended to replace advice given to you by your health care provider. Make sure you discuss any questions you have with your health care provider. Document Revised: 11/08/2020 Document Reviewed: 11/08/2020 Elsevier Patient Education  [place] After This sheet gives you information about how to care for yourself after your procedure. Your health care provider may also give you more specific instructions. If you have problems or questions, contact your health care provider. What can I expect after the procedure? After the procedure, it is common to have: Tiredness. Forgetfulness about what happened after the procedure. Impaired judgment for important decisions. Nausea or vomiting. Some difficulty with balance. Follow these instructions at home: For the time period you were told by your health care provider:     Rest as needed. Do not participate in activities where you could fall or become injured. Do not drive or use machinery. Do  not drink alcohol. Do not take sleeping pills or medicines that cause drowsiness. Do not make important decisions or sign legal documents. Do not take care of children on your own. Eating and drinking Follow the diet that is recommended by your health care provider. Drink enough fluid to keep your urine pale yellow. If you vomit: Drink water, juice, or soup when you can drink without vomiting. Make sure you have little or no nausea before eating solid foods. General instructions Have a responsible adult stay with you for the time you are told. It is important to have someone help care for you until you are awake and alert. Take over-the-counter and prescription medicines only as told by your health care provider. If you have sleep apnea, surgery and certain medicines can increase your risk for breathing problems. Follow instructions from your health care provider about wearing your sleep device: Anytime you are sleeping, including during daytime naps. While taking  prescription pain medicines, sleeping medicines, or medicines that make you drowsy. Avoid smoking. Keep all follow-up visits as told by your health care provider. This is important. Contact a health care provider if: You keep feeling nauseous or you keep vomiting. You feel light-headed. You are still sleepy or having trouble with balance after 24 hours. You develop a rash. You have a fever. You have redness or swelling around the IV site. Get help right away if: You have trouble breathing. You have new-onset confusion at home. Summary For several hours after your procedure, you may feel tired. You may also be forgetful and have poor judgment. Have a responsible adult stay with you for the time you are told. It is important to have someone help care for you until you are awake and alert. Rest as told. Do not drive or operate machinery. Do not drink alcohol or take sleeping pills. Get help right away if you have trouble  breathing, or if you suddenly become confused. This information is not intended to replace advice given to you by your health care provider. Make sure you discuss any questions you have with your health care provider. Document Revised: 02/20/2021 Document Reviewed: 02/18/2019 Elsevier Patient Education  [place].

## 2021-11-26 ENCOUNTER — Encounter (HOSPITAL_COMMUNITY)
Admission: RE | Admit: 2021-11-26 | Discharge: 2021-11-26 | Disposition: A | Discharge status: Home / Self Care | Payer: Medicare Other | Source: Ambulatory Visit | Attending: Internal Medicine | Admitting: Internal Medicine

## 2021-11-26 ENCOUNTER — Encounter (HOSPITAL_COMMUNITY): Payer: Self-pay

## 2021-11-26 DIAGNOSIS — Z01810 Encounter for preprocedural cardiovascular examination: Secondary | ICD-10-CM | POA: Diagnosis not present

## 2021-11-26 DIAGNOSIS — Z01812 Encounter for preprocedural laboratory examination: Secondary | ICD-10-CM | POA: Diagnosis present

## 2021-11-26 HISTORY — DX: Gastro-esophageal reflux disease without esophagitis: K21.9

## 2021-11-27 ENCOUNTER — Telehealth: Payer: Self-pay | Admitting: *Deleted
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
## 2021-11-29 ENCOUNTER — Ambulatory Visit (HOSPITAL_COMMUNITY): Admission: RE | Admit: 2021-11-29 | Payer: Medicare Other | Source: Ambulatory Visit | Admitting: Internal Medicine

## 2021-11-29 ENCOUNTER — Encounter (HOSPITAL_COMMUNITY): Admission: RE | Payer: Self-pay | Source: Ambulatory Visit

## 2021-11-29 SURGERY — COLONOSCOPY WITH PROPOFOL
Anesthesia: Monitor Anesthesia Care

## 2021-12-04 ENCOUNTER — Encounter (HOSPITAL_COMMUNITY): Payer: Self-pay

## 2021-12-04 ENCOUNTER — Other Ambulatory Visit: Payer: Self-pay

## 2021-12-04 ENCOUNTER — Emergency Department (HOSPITAL_COMMUNITY)
Admission: EM | Admit: 2021-12-04 | Discharge: 2021-12-04 | Disposition: A | Discharge status: Home / Self Care | Payer: Medicare Other | Attending: Emergency Medicine | Admitting: Emergency Medicine

## 2021-12-04 DIAGNOSIS — R109 Unspecified abdominal pain: Secondary | ICD-10-CM | POA: Diagnosis not present

## 2021-12-04 DIAGNOSIS — R1031 Right lower quadrant pain: Secondary | ICD-10-CM | POA: Insufficient documentation

## 2021-12-04 DIAGNOSIS — E119 Type 2 diabetes mellitus without complications: Secondary | ICD-10-CM | POA: Diagnosis not present

## 2021-12-04 DIAGNOSIS — Z79899 Other long term (current) drug therapy: Secondary | ICD-10-CM | POA: Insufficient documentation

## 2021-12-04 DIAGNOSIS — I10 Essential (primary) hypertension: Secondary | ICD-10-CM | POA: Insufficient documentation

## 2021-12-04 DIAGNOSIS — Z7951 Long term (current) use of inhaled steroids: Secondary | ICD-10-CM | POA: Insufficient documentation

## 2021-12-04 DIAGNOSIS — Z7984 Long term (current) use of oral hypoglycemic drugs: Secondary | ICD-10-CM | POA: Diagnosis not present

## 2021-12-04 MED ORDER — KETOROLAC TROMETHAMINE 15 MG/ML IJ SOLN
15.0000 mg | Freq: Once | INTRAMUSCULAR | Status: AC
Start: 2021-12-04 — End: 2021-12-04
  Administered 2021-12-04: 15 mg via INTRAMUSCULAR
  Filled 2021-12-04: qty 1

## 2021-12-04 NOTE — ED Provider Notes (Signed)
[facility] EMERGENCY DEPARTMENT Provider Note   CSN: [id number] Arrival date & time: 12/04/21  1322     History  Chief Complaint  Patient presents with   Flank Pain    Wendy Kramer is a 61 y.o. female.   Flank Pain   61 year old female presents emergency department with complaints of right-sided flank pain.  She states that symptoms began approximately 5 days ago when she was squatting down reaching for something in her cabinet.  She denies associated pain.  She states the pain has been resolving to Motrin as well as tramadol presents emergency department for evaluation of symptoms.  She states that pain is not present when she wakes but with movement such as walking or twisting or reaching for objects, pain begins and increases throughout the day.  Denies abdominal pain, nausea, vomiting, fever, chills, night sweats, chest pain, shortness of breath, urinary symptoms, vaginal symptoms, change in bowel habits.  Past medical history significant for diabetes mellitus, hyperlipidemia, hypertension, Karlene Lineman, CVA  Home Medications Prior to Admission medications   Medication Sig Start Date End Date Taking? Authorizing Provider  ACCU-CHEK GUIDE test strip USE TO CHECK BLOOD SUGARS ONCE DAILY 11/15/21   Brita Romp, NP  Accu-Chek Softclix Lancets lancets Use as instructed to monitor glucose 1-2 times daily. 06/19/20   Brita Romp, NP  acetaminophen (TYLENOL) 500 MG tablet Take 1,000 mg by mouth daily as needed for moderate pain.    [provider]  albuterol (PROVENTIL HFA;VENTOLIN HFA) 108 (90 Base) MCG/ACT inhaler Inhale 1 puff into the lungs as needed for wheezing or shortness of breath.    [provider]  ALPRAZolam Duanne Moron) 0.25 MG tablet Take 0.125-0.25 mg by mouth daily as needed for anxiety. 12/16/20   [provider]  Blood Glucose Monitoring Suppl (ACCU-CHEK GUIDE ME) w/Device KIT 1 Piece by Does not apply route as directed. 11/02/19   Cassandria Anger, MD  budesonide-formoterol (SYMBICORT) 80-4.5 MCG/ACT inhaler Inhale 1 puff into the lungs 2 (two) times daily.     [provider]  cyclobenzaprine (FLEXERIL) 5 MG tablet Take 5 mg by mouth 3 (three) times daily as needed for muscle spasms. 08/18/17   [provider]  fluticasone (FLONASE) 50 MCG/ACT nasal spray Place 1 spray into both nostrils daily as needed for allergies or rhinitis.     [provider]  HYDROcodone-acetaminophen (NORCO/VICODIN) 5-325 MG tablet Take 1 tablet by mouth daily as needed for moderate pain. 09/28/19   [provider]  ibuprofen (ADVIL) 200 MG tablet Take 400 mg by mouth 2 (two) times daily as needed for moderate pain.    [provider]  ipratropium-albuterol (DUONEB) 0.5-2.5 (3) MG/3ML SOLN Take 3 mLs by nebulization every 6 (six) hours as needed (shortness of breath). 01/05/21   [provider]  lisinopril (ZESTRIL) 20 MG tablet Take 20 mg by mouth 2 (two) times daily.    [provider]  metFORMIN (GLUCOPHAGE-XR) 500 MG 24 hr tablet Take 2 tablets (1,000 mg total) by mouth daily with supper. 09/25/21   Brita Romp, NP  Multiple Vitamins-Minerals (ONE DAILY MULTIVIT/IRON-FREE) TABS Take 1 tablet by mouth daily.    [provider]  RABEprazole (ACIPHEX) 20 MG tablet Take 1 tablet (20 mg total) by mouth daily before breakfast. 02/05/21   Mahala Menghini, PA-C  sodium chloride (OCEAN) 0.65 % SOLN nasal spray Place 1 spray into both nostrils as needed for congestion.    [provider]  traMADol (ULTRAM) 50 MG tablet Take 50-100 mg by mouth every 8 (eight) hours as needed for moderate pain. 09/20/16   [provider]  triamcinolone (KENALOG) 0.025 % cream Apply 1 Application topically as needed (rash). 11/16/19   [provider]      Allergies    Asa [aspirin], Augmentin [amoxicillin-pot clavulanate], Levaquin [levofloxacin in d5w], and Lovenox [enoxaparin  sodium]    Review of Systems   Review of Systems  Genitourinary:  Positive for flank pain.  All other systems reviewed and are negative.   Physical Exam Updated Vital Signs BP (!) 196/84 (BP Location: Right Arm)   Pulse 90   Temp 98.2 F (36.8 C) (Oral)   Resp 14   Ht '5\' 3"'  (1.6 m)   Wt 108 kg   LMP 09/06/2016   SpO2 96%   BMI 42.16 kg/m  Physical Exam Vitals and nursing note reviewed.  Constitutional:      General: She is not in acute distress.    Appearance: She is well-developed.  HENT:     Head: Normocephalic and atraumatic.  Eyes:     Conjunctiva/sclera: Conjunctivae normal.  Cardiovascular:     Rate and Rhythm: Normal rate and regular rhythm.     Heart sounds: No murmur heard. Pulmonary:     Effort: Pulmonary effort is normal. No respiratory distress.     Breath sounds: Normal breath sounds. No wheezing or rales.  Abdominal:     Palpations: Abdomen is soft.     Tenderness: There is no abdominal tenderness. There is no right CVA tenderness or left CVA tenderness.  Musculoskeletal:        General: No swelling.     Cervical back: Neck supple.     Right lower leg: No edema.     Left lower leg: No edema.     Comments: Patient has tenderness to palpation of right flank just beneath the rib cage.  It is worsened with horizontal rotation ipsilaterally.  No obvious overlying skin abnormalities noted.  No tenderness to palpation of cervical, thoracic, lumbar spine with no obvious step-off or deformity.  Skin:    General: Skin is warm and dry.     Capillary Refill: Capillary refill takes less than 2 seconds.  Neurological:     Mental Status: She is alert.  Psychiatric:        Mood and Affect: Mood normal.     ED Results / Procedures / Treatments   Labs (all labs ordered are listed, but only abnormal results are displayed) Labs Reviewed - No data to display  EKG None  Radiology No results found.  Procedures Procedures    Medications Ordered in  ED Medications  ketorolac (TORADOL) 15 MG/ML injection 15 mg (15 mg Intramuscular Given 12/04/21 1732)    ED Course/ Medical Decision Making/ A&P                           Medical Decision Making Risk Prescription drug management.   This patient presents to the ED for concern of right-sided flank pain, this involves an extensive number of treatment options, and is a complaint that carries with it a high risk of complications and morbidity.  The differential diagnosis includes nephrolithiasis, pyelonephritis, muscular strain/sprain, fracture, cholecystitis, small bowel obstruction, ovarian torsion, herpes zoster   Co morbidities that complicate the patient evaluation  See HPI   Additional history obtained:  Additional history obtained from EMR External records from outside  source obtained and reviewed including right upper quadrant ultrasound from 09/24/2021 indicative of cirrhotic morphology with heterogeneous increased echotexture and mildly nodular contour of liver with no obvious mass.   Lab Tests:  N/a   Imaging Studies ordered:  N/a   Cardiac Monitoring: / EKG:  The patient was maintained on a cardiac monitor.  I personally viewed and interpreted the cardiac monitored which showed an underlying rhythm of: Sinus rhythm   Consultations Obtained:  N/a   Problem List / ED Course / Critical interventions / Medication management  Right-sided flank pain I ordered medication including Toradol for pain   Reevaluation of the patient after these medicines showed that the patient resolved I have reviewed the patients home medicines and have made adjustments as needed   Social Determinants of Health:  Obesity.  Chronic cigarette use.  Denies illicit drug use.   Test / Admission - Considered:  Vitals signs significant for hypertension with blood pressure 196/84.  Patient not currently symptomatic for concerning hypertensive emergency.  She states she has not taken  her at home blood pressure medication and recommended continued at home.. Otherwise within normal range and stable throughout visit. Laboratory/imaging studies considered given nature of flank pain but patient declined secondary to treatment with shot of Toradol with close follow-up with her PCP.  Patient's pain most likely muscular in nature given mechanism of injury as well as alleviating and exacerbating factors.  Expectant management with NSAIDs deemed reasonable with prompt reevaluation.  Close follow-up with PCP recommended in 2 to 3 days for reevaluation of symptoms.  Treatment plan discussed with patient she knowledge understanding was agreeable to said plan. Worrisome signs and symptoms were discussed with the patient, and the patient acknowledged understanding to return to the ED if noticed. Patient was stable upon discharge.         Final Clinical Impression(s) / ED Diagnoses Final diagnoses:  Right flank pain    Rx / DC Orders ED Discharge Orders     None         Wilnette Kales, [place] 12/04/21 1737    Tretha Sciara, MD 12/04/21 2326

## 2021-12-04 NOTE — Discharge Instructions (Addendum)
Note the work-up today was overall consistent with muscular type injury.  As we discussed, there are multiple abdominal etiologies that can be causing your symptoms.  Although you declined imaging at this time, please do not hesitate to return to the emergency department if her symptoms are not responding to her home therapy.  I recommend close follow-up with your PCP in 2 to 3 days for reevaluation.  Please not hesitate to return to emergency department for worrisome signs and symptoms we discussed become apparent.

## 2021-12-04 NOTE — ED Triage Notes (Signed)
Pt to er, pt states that three days ago she was trying to get something out of a cabinet, and then started having some flank pain, states that the pain is worse with movement and better with tramadol and rest.

## 2021-12-10 ENCOUNTER — Encounter: Payer: Self-pay | Admitting: *Deleted

## 2021-12-10 DIAGNOSIS — M545 Low back pain, unspecified: Secondary | ICD-10-CM | POA: Diagnosis not present

## 2021-12-10 DIAGNOSIS — M25561 Pain in right knee: Secondary | ICD-10-CM | POA: Diagnosis not present

## 2021-12-10 DIAGNOSIS — I10 Essential (primary) hypertension: Secondary | ICD-10-CM | POA: Diagnosis not present

## 2021-12-10 DIAGNOSIS — I679 Cerebrovascular disease, unspecified: Secondary | ICD-10-CM | POA: Diagnosis not present

## 2021-12-10 DIAGNOSIS — I6990 Unspecified sequelae of unspecified cerebrovascular disease: Secondary | ICD-10-CM | POA: Diagnosis not present

## 2021-12-10 DIAGNOSIS — Z79891 Long term (current) use of opiate analgesic: Secondary | ICD-10-CM | POA: Diagnosis not present

## 2021-12-10 DIAGNOSIS — M79601 Pain in right arm: Secondary | ICD-10-CM | POA: Diagnosis not present

## 2021-12-10 DIAGNOSIS — M542 Cervicalgia: Secondary | ICD-10-CM | POA: Diagnosis not present

## 2021-12-10 NOTE — Telephone Encounter (Signed)
Patient requested the 26th or 27th of October, the 26 th of October had morning appt and she was fine with the time. New instructions and pre op date will be mailed to pt.

## 2021-12-27 ENCOUNTER — Encounter: Payer: Self-pay | Admitting: Nurse Practitioner

## 2021-12-27 ENCOUNTER — Ambulatory Visit (INDEPENDENT_AMBULATORY_CARE_PROVIDER_SITE_OTHER): Payer: Medicare Other | Admitting: Nurse Practitioner

## 2021-12-27 VITALS — BP 165/82 | HR 88 | Ht 63.0 in | Wt 233.6 lb

## 2021-12-27 DIAGNOSIS — E1165 Type 2 diabetes mellitus with hyperglycemia: Secondary | ICD-10-CM | POA: Diagnosis not present

## 2021-12-27 DIAGNOSIS — E782 Mixed hyperlipidemia: Secondary | ICD-10-CM | POA: Diagnosis not present

## 2021-12-27 LAB — POCT GLYCOSYLATED HEMOGLOBIN (HGB A1C): Hemoglobin A1C: 9.6 % — AB (ref 4.0–5.6)

## 2021-12-27 MED ORDER — METFORMIN HCL 500 MG PO TABS: 1000.0000 mg | ORAL_TABLET | Freq: Two times a day (BID) | ORAL | 3 refills | Status: DC

## 2021-12-27 NOTE — Progress Notes (Signed)
12/27/2021, 10:23 AM   Endocrinology follow-up note  Subjective:    Patient ID: Wendy Kramer, female    DOB: [birth year].  Adyn Backs is being seen in follow-up after she was seen in consultation for management of currently uncontrolled symptomatic diabetes requested by  Carrolyn Meiers, MD.   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   Unspecified asthma with (acute) exacerbation    Vitamin D deficiency     Past Surgical History:  Procedure Laterality Date   BIOPSY  03/05/2018   Procedure: BIOPSY;  Surgeon: Daneil Dolin, MD;  [facility]: [facility];  Service: Endoscopy;;  gastric   CHOLECYSTECTOMY     COLONOSCOPY WITH PROPOFOL N/A 03/05/2018   Dr. Gala Romney: Diverticulosis in the sigmoid and descending colon.  3 colon polyps removed, 2 hyperplastic and one sessile serrated polyp.  Next colonoscopy 3 years.   ESOPHAGOGASTRODUODENOSCOPY (EGD) WITH PROPOFOL N/A 03/05/2018   Dr. Gala Romney: Erosive reflux esophagitis, small hiatal hernia, erosive gastropathy with reactive gastropathy on biopsy.  No H. pylori.   HERNIA REPAIR     umbilical   POLYPECTOMY  03/05/2018   Procedure: POLYPECTOMY;  Surgeon: Daneil Dolin, MD;  Location: [facility];  Service: Endoscopy;;  colon   SHOULDER ARTHROSCOPY Right    TEE WITHOUT CARDIOVERSION  2018   patent foramen ovale   TONSILLECTOMY AND ADENOIDECTOMY      TUBAL LIGATION      Social History   Socioeconomic History   Marital status: Legally Separated    Spouse name: Not on file   Number of children: Not on file   Years of education: Not on file   Highest education level: Not on file  Occupational History   Not on file  Tobacco Use   Smoking status: Every Day    Packs/day: 0.50    Years: 40.00    Total pack years: 20.00    Types: Cigarettes   Smokeless tobacco: Never  Vaping Use   Vaping Use: Never used  Substance and Sexual Activity   Alcohol use: No   Drug use: No   Sexual activity: Not Currently    Birth control/protection: Post-menopausal, Surgical    Comment: tubal  Other Topics Concern   Not on file  Social History Narrative   Not on file   Social Determinants of Health   Financial Resource Strain: High Risk (10/06/2019)   Overall Financial Resource Strain (CARDIA)    Difficulty of  Paying Living Expenses: Hard  Food Insecurity: No Food Insecurity (10/06/2019)   Hunger Vital Sign    Worried About Running Out of Food in the Last Year: Never true    Ran Out of Food in the Last Year: Never true  Transportation Needs: No Transportation Needs (10/06/2019)   PRAPARE - Hydrologist (Medical): No    Lack of Transportation (Non-Medical): No  Physical Activity: Inactive (10/06/2019)   Exercise Vital Sign    Days of Exercise per Week: 0 days    Minutes of Exercise per Session: 0 min  Stress: Stress Concern Present (10/06/2019)   [place]    Feeling of Stress : To some extent  Social Connections: Socially Isolated (10/06/2019)   Social Connection and Isolation Panel [NHANES]    Frequency of Communication with Friends and Family: More than three times a week    Frequency of Social Gatherings with Friends and Family: More than three times a week    Attends Religious Services: Never    Marine scientist or Organizations: No    Attends  Music therapist: Never    Marital Status: Separated    Family History  Problem Relation Age of Onset   Breast cancer Maternal Aunt    Colon cancer Other        maternal great grandfather   Liver disease Brother        Fatty liver   HIV Brother    Hypertension Mother    Hyperlipidemia Mother    Hypertension Father    Thyroid disease Sister    Pancreatic cancer Sister    Healthy Daughter    Healthy Son    Healthy Son    Heart disease Son     Outpatient Encounter Medications as of 12/27/2021  Medication Sig   ACCU-CHEK GUIDE test strip USE TO CHECK BLOOD SUGARS ONCE DAILY   Accu-Chek Softclix Lancets lancets Use as instructed to monitor glucose 1-2 times daily.   acetaminophen (TYLENOL) 500 MG tablet Take 1,000 mg by mouth daily as needed for moderate pain.   albuterol (PROVENTIL HFA;VENTOLIN HFA) 108 (90 Base) MCG/ACT inhaler Inhale 1 puff into the lungs as needed for wheezing or shortness of breath.   ALPRAZolam (XANAX) 0.25 MG tablet Take 0.125-0.25 mg by mouth daily as needed for anxiety.   Blood Glucose Monitoring Suppl (ACCU-CHEK GUIDE ME) w/Device KIT 1 Piece by Does not apply route as directed.   budesonide-formoterol (SYMBICORT) 80-4.5 MCG/ACT inhaler Inhale 1 puff into the lungs 2 (two) times daily.    cyclobenzaprine (FLEXERIL) 5 MG tablet Take 5 mg by mouth 3 (three) times daily as needed for muscle spasms.   fluticasone (FLONASE) 50 MCG/ACT nasal spray Place 1 spray into both nostrils daily as needed for allergies or rhinitis.    HYDROcodone-acetaminophen (NORCO/VICODIN) 5-325 MG tablet Take 1 tablet by mouth daily as needed for moderate pain.   ibuprofen (ADVIL) 200 MG tablet Take 400 mg by mouth 2 (two) times daily as needed for moderate pain.   ipratropium-albuterol (DUONEB) 0.5-2.5 (3) MG/3ML SOLN Take 3 mLs by nebulization every 6 (six) hours as needed (shortness of breath).   lisinopril (ZESTRIL) 20 MG tablet Take 20 mg by mouth 2 (two) times daily.    metFORMIN (GLUCOPHAGE) 500 MG tablet Take 2 tablets (1,000 mg total) by mouth 2 (two) times daily with a meal.   Multiple Vitamins-Minerals (ONE DAILY MULTIVIT/IRON-FREE) TABS Take 1  tablet by mouth daily.   RABEprazole (ACIPHEX) 20 MG tablet Take 1 tablet (20 mg total) by mouth daily before breakfast.   sodium chloride (OCEAN) 0.65 % SOLN nasal spray Place 1 spray into both nostrils as needed for congestion.   traMADol (ULTRAM) 50 MG tablet Take 50-100 mg by mouth every 8 (eight) hours as needed for moderate pain.   triamcinolone (KENALOG) 0.025 % cream Apply 1 Application topically as needed (rash).   [DISCONTINUED] metFORMIN (GLUCOPHAGE-XR) 500 MG 24 hr tablet Take 2 tablets (1,000 mg total) by mouth daily with supper.   No facility-administered encounter medications on file as of 12/27/2021.    ALLERGIES: Allergies  Allergen Reactions   Asa [Aspirin] Anaphylaxis    Tolerates other NSAIDS   Augmentin [Amoxicillin-Pot Clavulanate] Other (See Comments)    "Retching and pass out"   Levaquin [Levofloxacin In D5w] Itching and Other (See Comments)    "body turned red"   Lovenox [Enoxaparin Sodium] Other (See Comments)    phsyician instructed not to use ever again. "white veins popped out"    VACCINATION STATUS:  There is no immunization history on file for this patient.  Diabetes She presents for her follow-up diabetic visit. She has type 2 diabetes mellitus. Onset time: She was diagnosed at approximate age of 59 years, after several years of prediabetes. Her disease course has been worsening. There are no hypoglycemic associated symptoms. Pertinent negatives for hypoglycemia include no confusion, headaches, pallor or seizures. Pertinent negatives for diabetes include no chest pain, no fatigue, no polydipsia, no polyphagia and no polyuria. There are no hypoglycemic complications. Symptoms are stable. Diabetic complications include a CVA. Risk factors for coronary artery disease include  diabetes mellitus, dyslipidemia, family history, obesity, tobacco exposure, post-menopausal, sedentary lifestyle and hypertension. Current diabetic treatment includes oral agent (monotherapy). She is compliant with treatment most of the time. Her weight is fluctuating minimally. She is following a generally unhealthy diet. When asked about meal planning, she reported none. She has not had a previous visit with a dietitian. She never participates in exercise. Her home blood glucose trend is fluctuating minimally. (She presents today with her meter, no logs, showing above target fasting glycemic profile.  Her POCT A1c today is 9.6%, increasing from last visit of 9%.  She admits she doesn't always eat the healthiest meals and does not exercise but is looking into water aerobics soon.  Analysis of her meter shows 7-day average of 187, 14-day average of 193, 90-day average of 179.  She thinks her ER Metformin is not as effective and wants to switch back to regular form.) An ACE inhibitor/angiotensin II receptor blocker is being taken. She does not see a podiatrist.Eye exam is current.  Hyperlipidemia This is a chronic problem. The current episode started more than 1 year ago. Factors aggravating her hyperlipidemia include fatty foods. Pertinent negatives include no chest pain, myalgias or shortness of breath. She is currently on no antihyperlipidemic treatment. Compliance problems include adherence to diet and adherence to exercise.  Risk factors for coronary artery disease include diabetes mellitus, hypertension, obesity, post-menopausal and a sedentary lifestyle.  Hypertension This is a chronic problem. The current episode started more than 1 year ago. The problem has been waxing and waning since onset. The problem is uncontrolled. Pertinent negatives include no chest pain, headaches, palpitations or shortness of breath. There are no associated agents to hypertension. Risk factors for coronary artery disease  include dyslipidemia, diabetes mellitus, obesity, smoking/tobacco exposure, sedentary lifestyle, post-menopausal state and family  history. Past treatments include ACE inhibitors and diuretics. The current treatment provides moderate improvement. Compliance problems include diet and exercise.  Hypertensive end-organ damage includes CVA. Identifiable causes of hypertension include sleep apnea.    Review of systems  Constitutional: + Minimally fluctuating body weight,  current Body mass index is 41.38 kg/m. , no fatigue, no subjective hyperthermia, no subjective hypothermia Eyes: no blurry vision, no xerophthalmia ENT: no sore throat, no nodules palpated in throat, no dysphagia/odynophagia, no hoarseness Cardiovascular: no chest pain, no shortness of breath, no palpitations, no leg swelling Respiratory: no cough, no shortness of breath Gastrointestinal: no nausea/vomiting/diarrhea Musculoskeletal: no muscle/joint aches Skin: no rashes, no hyperemia Neurological: no tremors, no numbness, no tingling, no dizziness Psychiatric: no depression, no anxiety   Objective:    BP (!) 165/82 (BP Location: Left Arm, Patient Position: Sitting, Cuff Size: Large)   Pulse 88   Ht _0  (1.6 m)   Wt 233 lb 9.6 oz (106 kg)   LMP 09/06/2016   BMI 41.38 kg/m   Wt Readings from Last 3 Encounters:  12/27/21 233 lb 9.6 oz (106 kg)  12/04/21 238 lb (108 kg)  11/26/21 233 lb 0.4 oz (105.7 kg)   BP Readings from Last 3 Encounters:  12/27/21 (!) 165/82  12/04/21 (!) 196/84  11/26/21 (!) 175/73     Physical Exam- Limited  Constitutional:  Body mass index is 41.38 kg/m. , not in acute distress, normal state of mind Eyes:  EOMI, no exophthalmos Neck: Supple Cardiovascular: RRR, no murmurs, rubs, or gallops, no edema Respiratory: Adequate breathing efforts, no crackles, rales, rhonchi, or wheezing Musculoskeletal: no gross deformities, strength intact in all four extremities, no gross restriction of  joint movements Skin:  no rashes, no hyperemia Neurological: no tremor with outstretched hands   Diabetic Foot Exam - Simple   Simple Foot Form Diabetic Foot exam was performed with the following findings: Yes 12/27/2021 10:18 AM  Visual Inspection See comments: Yes Sensation Testing Intact to touch and monofilament testing bilaterally: Yes Pulse Check Posterior Tibialis and Dorsalis pulse intact bilaterally: Yes Comments Varicose veins to left foot, nails need of trim     CMP ( most recent) CMP     Component Value Date/Time   NA 137 09/24/2021 0919   NA 142 02/15/2020 1018   K 4.5 09/24/2021 0919   CL 103 09/24/2021 0919   CO2 24 09/24/2021 0919   GLUCOSE 173 (H) 09/24/2021 0919   BUN 13 09/24/2021 0919   BUN 12 02/15/2020 1018   CREATININE 0.74 09/24/2021 0919   CALCIUM 9.4 09/24/2021 0919   PROT 6.6 09/24/2021 0919   PROT 7.2 02/15/2020 1018   ALBUMIN 4.5 02/15/2020 1018   AST 46 (H) 09/24/2021 0919   ALT 29 09/24/2021 0919   ALKPHOS 103 02/15/2020 1018   BILITOT 0.5 09/24/2021 0919   BILITOT 0.4 02/15/2020 1018   GFRNONAA 74 02/15/2020 1018   GFRAA 85 02/15/2020 1018     Diabetic Labs (most recent): Lab Results  Component Value Date   HGBA1C 9.6 (A) 12/27/2021   HGBA1C 9.0 09/24/2021   HGBA1C 8.4 (A) 02/01/2021   MICROALBUR 80 02/09/2020     Lab Results  Component Value Date   TSH 1.85 07/23/2019      Assessment & Plan:   1) Uncontrolled type 2 diabetes mellitus with hyperglycemia (HCC)  - Nikitia Musick has currently uncontrolled symptomatic type 2 DM since  61 years of age.  She presents today with her meter, no  logs, showing above target fasting glycemic profile.  Her POCT A1c today is 9.6%, increasing from last visit of 9%.  She admits she doesn't always eat the healthiest meals and does not exercise but is looking into water aerobics soon.  Analysis of her meter shows 7-day average of 187, 14-day average of 193, 90-day average of 179.  She  thinks her ER Metformin is not as effective and wants to switch back to regular form.  - I had a long discussion with her about the progressive nature of diabetes and the pathology behind its complications. -her diabetes is complicated by obesity/sedentary life, active smoking and she remains at a high risk for more acute and chronic complications which include CAD, CVA, CKD, retinopathy, and neuropathy. These are all discussed in detail with her.  - Nutritional counseling repeated at each appointment due to patients tendency to fall back in to old habits.  - The patient admits there is a room for improvement in their diet and drink choices. -  Suggestion is made for the patient to avoid simple carbohydrates from their diet including Cakes, Sweet Desserts / Pastries, Ice Cream, Soda (diet and regular), Sweet Tea, Candies, Chips, Cookies, Sweet Pastries, Store Bought Juices, Alcohol in Excess of 1-2 drinks a day, Artificial Sweeteners, Coffee Creamer, and "Sugar-free" Products. This will help patient to have stable blood glucose profile and potentially avoid unintended weight gain.   - I encouraged the patient to switch to unprocessed or minimally processed complex starch and increased protein intake (animal or plant source), fruits, and vegetables.   - Patient is advised to stick to a routine mealtimes to eat 3 meals a day and avoid unnecessary snacks (to snack only to correct hypoglycemia).  - she will be scheduled with Jearld Fenton, RDN, CDE for diabetes education.  - I have approached her with the following individualized plan to manage  her diabetes and patient agrees:   -Will switch her back to Metformin 1000 mg po twice daily with meals (regular form).  I also discussed the possibility of trying GLP1 vs basal insulin if we continue to struggle getting glucose managed. She would like to do more research on these options first.   She is encouraged to start monitoring glucose once daily,  before breakfast to help her regain control of her diabetes.   - Specific targets for  A1c;  LDL, HDL,  and Triglycerides were discussed with the patient.  2) Blood Pressure /Hypertension:   Her blood pressure is still not controlled to target.  She is advised to continue Lisinopril 20 mg po daily and HCTZ 12.5 mg twice a week and follow up with her PCP for concerns.  She had not yet taken her morning medications.  3) Lipids/Hyperlipidemia:  Her recent lipid panel from 09/24/21 shows uncontrolled LDL of 127 and elevated triglycerides of 161.  She is not currently on any lipid lowering agents. medications.  4) Weight/Diet:  Her Body mass index is 41.38 kg/m.  -   clearly complicating her diabetes care.   she is  a candidate for weight loss. I discussed with her the fact that loss of 5 - 10% of her  current body weight will have the most impact on her diabetes management.  Exercise, and detailed carbohydrates information provided  -  detailed on discharge instructions.  5) Chronic Care/Health Maintenance: -she is on ACEI medications and is encouraged to initiate and continue to follow up with Ophthalmology, Dentist,  Podiatrist at least yearly or  according to recommendations, and advised to  Quit smoking. I have recommended yearly flu vaccine and pneumonia vaccine at least every 5 years; moderate intensity exercise for up to 150 minutes weekly; and  sleep for at least 7 hours a day.  - she is advised to maintain close follow up with Carrolyn Meiers, MD for primary care needs, as well as her other providers for optimal and coordinated care.     I spent 45 minutes in the care of the patient today including review of labs from [facility], Lipids, Thyroid Function, Hematology (current and previous including abstractions from other facilities); face-to-face time discussing  her blood glucose readings/logs, discussing hypoglycemia and hyperglycemia episodes and symptoms, medications doses, her options  of short and long term treatment based on the latest standards of care / guidelines;  discussion about incorporating lifestyle medicine;  and documenting the encounter. Risk reduction counseling performed per USPSTF guidelines to reduce obesity and cardiovascular risk factors.     Please refer to Patient Instructions for Blood Glucose Monitoring and Insulin/Medications Dosing Guide"  in media tab for additional information. Please  also refer to " Patient Self Inventory" in the Media  tab for reviewed elements of pertinent patient history.  Erikka Risdon participated in the discussions, expressed understanding, and voiced agreement with the above plans.  All questions were answered to her satisfaction. she is encouraged to contact clinic should she have any questions or concerns prior to her return visit.   Follow up plan: - Return in about 3 months (around 03/28/2022) for Diabetes F/U with A1c in office, Bring meter and logs, Previsit labs.  Rayetta Pigg, [facility] [address] [place], [place] [address] Phone: [phone] Fax: [phone]  12/27/2021, 10:23 AM

## 2022-01-02 ENCOUNTER — Telehealth: Payer: Self-pay | Admitting: Nurse Practitioner

## 2022-01-02 NOTE — Telephone Encounter (Signed)
New message   Pt c/o medication issue:  1. Name of Medication: metFORMIN (GLUCOPHAGE) 500 MG tablet  2. How are you currently taking this medication (dosage and times per day)? daily  3. Are you having a reaction (difficulty breathing--STAT)? No   4. What is your medication issue? Recent appt switch back to 1,000 metformin instead extended release  drug store has it as 500 mg  medication clarification

## 2022-01-02 NOTE — Telephone Encounter (Signed)
She will take 2 of the regular strength pills twice daily.

## 2022-01-02 NOTE — Telephone Encounter (Signed)
Will switch her back to Metformin 1000 mg po twice daily with meals (regular form).  I also discussed the possibility of trying GLP1 vs basal insulin if we continue to struggle getting glucose managed. She would like to do more research on these options first.  Would the patient take 2 of the Metformin Extended Release?

## 2022-01-03 NOTE — Telephone Encounter (Signed)
My understanding she wants her to take two of the regular metformin '500mg'$  twice daily.

## 2022-01-03 NOTE — Telephone Encounter (Signed)
Patient was called and made aware of how and what to take of the Metformin.

## 2022-01-03 NOTE — Progress Notes (Signed)
Primary Care Physician:  Carrolyn Meiers, MD  Primary Gastroenterologist:  Cristopher Estimable. [name], MD  Patient Location: Home Reason for Visit: follow up - cirrhosis  Persons present on the virtual encounter, with roles: Patient - Wendy Kramer; Provider - Venetia Night, NP   Total time (minutes) spent on medical discussion: 18 minutes  Virtual Visit Encounter Note Visit is conducted virtually and was requested by patient.   I connected with Wendy Kramer on 06/[birth year]/92 at  9:00 AM EDT by video and verified that I am speaking with the correct person using two identifiers.   I discussed the limitations, risks, security and privacy concerns of performing an evaluation and management service by video and the availability of in person appointments. I also discussed with the patient that there may be a patient responsible charge related to this service. The patient expressed understanding and agreed to proceed.  Chief Complaint  Patient presents with   Follow-up    History of Present Illness: Wendy Kramer is a 61 y.o. 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   Last EGD and Colonoscopy 03/05/2018. EGD with erosive reflux esophagitis, small hiatal hernia, erosive gastropathy s/p biopsy. Stomach biopsy with reactive gastropathy, negative for H. Pylori. Colonoscopy with sigmoid and descending diverticulosis, two 4-6 mm polyps in rectum and descending colon, 13 mm polyp at hepatic flexure. Biopsy with hyperplastic and serrated polyps. Advised repeat TCS in 3 years.   Last office visit 09/12/21 with Dr. Gala Romney. Recently had mechanical fall with compression fractures. Had worsening constipation with opioid therapy . GERD controled on raberprazole 20 mg daily. No rectal bleeding. To be scheduled for surveillance colonoscopy and screening EGD. Advised Hep A and B vaccination. CBC, CMP, INR and  AFP. Continue Aciphex each morning.  Advised better control of blood sugar and regular exercise. Follow up in 4 months.   Labs 09/24/21: Hgb 13, MCV 87, AST 46, ALT 30, A1c 9, INR 0.9, AFP 3.1 (MELD Na: 6).  Ultrasound Liver 09/24/21: Nodular contour. No evidence of hepatoma. Repeat in 6 months.  Today: Reflux is good for the most part, having occasional breakthrough symptoms, about 2 weeks ago she has had 3 episodes and it has been occurring in the evenings after dinner. Has been trying to not eat as much at night for dinner. Denies abdominal swelling, lower extremity edema, melena, BRBPR, confusion, fogginess, nausea, vomiting, dysphagia, no pruritis, jaundice. Does have some loose sotols from metformin. Following a low sodium diet, not adding extra salt to foods. Blood sugars were high and A1c was high. Was switched to extended release and recently switched to metformin 1000 mg BID, next step is trulicity or another GLP-! Agonist. No chest pain or shortness of breath.   Has mesh from umbilical hernia repair and then she had a fall around mothers day this year and had pain in her abdomen.It had gotten better and then yesterday she noticed that she felt a stabbing feeling to her side but after taking some ibuprofen and pain medication it is better today.   Had gotten some cinnamon to try for her blood sugars, got [place] low dose.   Has not gotten Hepatitis A and B vaccinations, is going to go to [facility] to get them done after her colonoscopy. Never gets a flu shot - quit taking them because she was sicker after getting it than when getting the flu.  Medications Current Meds  Medication Sig   ACCU-CHEK GUIDE test strip USE TO CHECK BLOOD SUGARS ONCE DAILY   Accu-Chek Softclix Lancets lancets Use as instructed to monitor glucose 1-2 times daily.   albuterol (PROVENTIL HFA;VENTOLIN HFA) 108 (90 Base) MCG/ACT inhaler Inhale 1 puff into the lungs as needed for wheezing or shortness of breath.    ALPRAZolam (XANAX) 0.25 MG tablet Take 0.125-0.25 mg by mouth daily as needed for anxiety.   Blood Glucose Monitoring Suppl (ACCU-CHEK GUIDE ME) w/Device KIT 1 Piece by Does not apply route as directed.   budesonide-formoterol (SYMBICORT) 80-4.5 MCG/ACT inhaler Inhale 1 puff into the lungs 2 (two) times daily.    fluticasone (FLONASE) 50 MCG/ACT nasal spray Place 1 spray into both nostrils daily as needed for allergies or rhinitis.    ibuprofen (ADVIL) 600 MG tablet Take 600 mg by mouth every 8 (eight) hours as needed.   ipratropium-albuterol (DUONEB) 0.5-2.5 (3) MG/3ML SOLN Take 3 mLs by nebulization every 6 (six) hours as needed (shortness of breath).   lisinopril (ZESTRIL) 20 MG tablet Take 20 mg by mouth 2 (two) times daily.   metFORMIN (GLUCOPHAGE) 500 MG tablet Take 2 tablets (1,000 mg total) by mouth 2 (two) times daily with a meal.   Multiple Vitamins-Minerals (ONE DAILY MULTIVIT/IRON-FREE) TABS Take 1 tablet by mouth daily.   RABEprazole (ACIPHEX) 20 MG tablet Take 1 tablet (20 mg total) by mouth daily before breakfast.   traMADol (ULTRAM) 50 MG tablet Take 50-100 mg by mouth every 8 (eight) hours as needed for moderate pain.   triamcinolone (KENALOG) 0.025 % cream Apply 1 Application topically as needed (rash).     History Past Medical History:  Diagnosis Date   Arthritis    Cardiac murmur    Cervical spine disease    Cirrhosis ([place])    likely secondary to NASH   Diabetes mellitus without complication (HCC)    diet controlled   Exogenous hyperlipidemia    GERD (gastroesophageal reflux disease)    History of kidney stones    HTN (hypertension)    Morbid obesity due to excess calories (HCC)    Other chronic pain    Panic disorder    Patent foramen ovale    TEE 2018   Prediabetes    Sleep apnea    Stroke ([place])    "mini-stroke" no deficits from this.   Unspecified asthma with (acute) exacerbation    Vitamin D deficiency     Past Surgical History:  Procedure Laterality  Date   BIOPSY  03/05/2018   Procedure: BIOPSY;  Surgeon: Daneil Dolin, MD;  Location: [facility];  Service: Endoscopy;;  gastric   CHOLECYSTECTOMY     COLONOSCOPY WITH PROPOFOL N/A 03/05/2018   Dr. Gala Romney: Diverticulosis in the sigmoid and descending colon.  3 colon polyps removed, 2 hyperplastic and one sessile serrated polyp.  Next colonoscopy 3 years.   ESOPHAGOGASTRODUODENOSCOPY (EGD) WITH PROPOFOL N/A 03/05/2018   Dr. Gala Romney: Erosive reflux esophagitis, small hiatal hernia, erosive gastropathy with reactive gastropathy on biopsy.  No H. pylori.   HERNIA REPAIR     umbilical   POLYPECTOMY  03/05/2018   Procedure: POLYPECTOMY;  Surgeon: Daneil Dolin, MD;  Location: [facility];  Service: Endoscopy;;  colon   SHOULDER ARTHROSCOPY Right    TEE WITHOUT CARDIOVERSION  2018   patent foramen ovale   TONSILLECTOMY AND ADENOIDECTOMY     TUBAL LIGATION      Family History  Problem Relation Age  of Onset   Breast cancer Maternal Aunt    Colon cancer Other        maternal great grandfather   Liver disease Brother        Fatty liver   HIV Brother    Hypertension Mother    Hyperlipidemia Mother    Hypertension Father    Thyroid disease Sister    Pancreatic cancer Sister    Healthy Daughter    Healthy Son    Healthy Son    Heart disease Son     Social History   Socioeconomic History   Marital status: Legally Separated    Spouse name: Not on file   Number of children: Not on file   Years of education: Not on file   Highest education level: Not on file  Occupational History   Not on file  Tobacco Use   Smoking status: Every Day    Packs/day: 0.50    Years: 40.00    Total pack years: 20.00    Types: Cigarettes   Smokeless tobacco: Never  Vaping Use   Vaping Use: Never used  Substance and Sexual Activity   Alcohol use: No   Drug use: No   Sexual activity: Not Currently    Birth control/protection: Post-menopausal, Surgical    Comment: tubal  Other Topics Concern    Not on file  Social History Narrative   Not on file   Social Determinants of Health   Financial Resource Strain: High Risk (10/06/2019)   Overall Financial Resource Strain (CARDIA)    Difficulty of Paying Living Expenses: Hard  Food Insecurity: No Food Insecurity (10/06/2019)   Hunger Vital Sign    Worried About Running Out of Food in the Last Year: Never true    Ran Out of Food in the Last Year: Never true  Transportation Needs: No Transportation Needs (10/06/2019)   PRAPARE - Hydrologist (Medical): No    Lack of Transportation (Non-Medical): No  Physical Activity: Inactive (10/06/2019)   Exercise Vital Sign    Days of Exercise per Week: 0 days    Minutes of Exercise per Session: 0 min  Stress: Stress Concern Present (10/06/2019)   [place]    Feeling of Stress : To some extent  Social Connections: Socially Isolated (10/06/2019)   Social Connection and Isolation Panel [NHANES]    Frequency of Communication with Friends and Family: More than three times a week    Frequency of Social Gatherings with Friends and Family: More than three times a week    Attends Religious Services: Never    Marine scientist or Organizations: No    Attends Archivist Meetings: Never    Marital Status: Separated      Review of Systems: Gen: Denies fever, chills, anorexia. Denies fatigue, weakness, weight loss.  CV: Denies chest pain, palpitations, syncope, peripheral edema, and claudication. Resp: Denies dyspnea at rest, cough, wheezing, coughing up blood, and pleurisy. GI: see HPI Derm: Denies rash, itching, dry skin Psych: Denies depression, anxiety, memory loss, confusion. No homicidal or suicidal ideation.  Heme: Denies bruising, bleeding, and enlarged lymph nodes.  Observations/Objective: No distress. Alert and oriented. Pleasant. Well nourished. Normal mood and affect. Unable to perform  complete physical exam due to video encounter.   Assessment:  NASH Cirrhosis: History of positive ANA but with normal immunoglobulins. In need of hepatitis A and B vaccination. Last ultrasound 09/24/21 without evidence  of hepatoma. Labs 09/24/21 with AST 46, ALT 30. AFP 3.1. MELD Na - 6.  Scheduled for EGD for variceal screening on 01/24/22, Last EGD in 2019 without varices.  Doing well overall.  No reports of abdominal distention or lower extremity edema.  No jaundice, pruritus, confusion, or other mental status changes.  No melena, BRBPR, nausea/vomiting, or dysphagia.  No weight loss.  She is working on better control of her blood sugars, is currently taking metformin 1000 mg twice daily and will have follow-up soon with endocrinology to recheck her A1c and make changes to regimen.  She states she is following a low-sodium diet, and is now longer adding salt to any of her foods.  No regular exercise.  She has not completed hepatitis A and B vaccinations, but states she will follow-up on having this completed after she has her EGD and colonoscopy later this month.  Patient queried about the safety of taking over-the-counter cinnamon.  We discussed that low-dose [place] is a safest formulation, but that cinnamon toxicity can cause further liver damage and that she is going to use, she should do so with caution.  She will be due for lab work in December and we can evaluate her LFTs at that time.  GERD:  Mostly well controlled on Aciphex 20 mg daily.  In the last 2 weeks, she has had about 3 breakthrough episodes.  She has not taken anything for this as she wanted to make sure it was okay first.  History of colon polyps: Scheduled for colonoscopy 01/24/22.  No alarm symptoms present.   Plan:  Repeat US in December 2023 (on recall) CBC, CMP, PT/INR in December 2023 -we will send lab requisitions to her at that time. Proceed with scheduled EGD/TCS Hepatitis A and B vaccination recommended.  Continue  Aciphex 20 mg daily.  Famotidine or Tums as needed.  GERD diet/lifestyle modifications.  Follow up 3 months.    Follow Up Instructions:  I discussed the assessment and treatment plan with the patient. The patient was provided an opportunity to ask questions and all were answered. The patient agreed with the plan and demonstrated an understanding of the instructions.   The patient was advised to call back or seek an in-person evaluation if the symptoms worsen or if the condition fails to improve as anticipated.    Venetia Night, MSN, APRN, FNP-BC, AGACNP-BC [facility]

## 2022-01-04 ENCOUNTER — Telehealth: Payer: Self-pay | Admitting: *Deleted

## 2022-01-04 ENCOUNTER — Encounter: Payer: Self-pay | Admitting: Gastroenterology

## 2022-01-04 ENCOUNTER — Telehealth (INDEPENDENT_AMBULATORY_CARE_PROVIDER_SITE_OTHER): Payer: Medicare Other | Admitting: Gastroenterology

## 2022-01-04 ENCOUNTER — Telehealth: Payer: Self-pay | Admitting: Gastroenterology

## 2022-01-04 VITALS — Ht 63.0 in | Wt 232.0 lb

## 2022-01-04 DIAGNOSIS — Z86010 Personal history of colonic polyps: Secondary | ICD-10-CM

## 2022-01-04 DIAGNOSIS — K7460 Unspecified cirrhosis of liver: Secondary | ICD-10-CM | POA: Diagnosis not present

## 2022-01-04 DIAGNOSIS — K219 Gastro-esophageal reflux disease without esophagitis: Secondary | ICD-10-CM | POA: Diagnosis not present

## 2022-01-04 NOTE — Telephone Encounter (Signed)
Noted. Labs placed for recall

## 2022-01-04 NOTE — Telephone Encounter (Signed)
Wendy Kramer - patient is already on recall for 35-monthultrasound.  We will place lab orders today for CBC, CMP, PT/INR to be completed in December 2023.  Please place her on recall to send her these lab requisitions in December.   SManuela Schwartz-please make patient a follow-up appointment in about 3 months with me, Dr. RGala Romney or other APP to follow-up on cirrhosis.   CVenetia Night MSN, APRN, FNP-BC, AGACNP-BC [facility]

## 2022-01-04 NOTE — Telephone Encounter (Signed)
[name], you are scheduled for a virtual visit with your provider today.  Just as we do with appointments in the office, we must obtain your consent to participate.  Your consent will be active for this visit and any virtual visit you may have with one of our providers in the next 365 days.  If you have a MyChart account, I can also send a copy of this consent to you electronically.  All virtual visits are billed to your insurance company just like a traditional visit in the office.  As this is a virtual visit, video technology does not allow for your provider to perform a traditional examination.  This may limit your provider's ability to fully assess your condition.  If your provider identifies any concerns that need to be evaluated in person or the need to arrange testing such as labs, EKG, etc, we will make arrangements to do so.  Although advances in technology are sophisticated, we cannot ensure that it will always work on either your end or our end.  If the connection with a video visit is poor, we may have to switch to a telephone visit.  With either a video or telephone visit, we are not always able to ensure that we have a secure connection.   I need to obtain your verbal consent now.   Are you willing to proceed with your visit today?  Pt consent to virtual video visit.

## 2022-01-04 NOTE — Patient Instructions (Signed)
Please proceed with having your upper endoscopy and colonoscopy done on 01/24/2022.  Continue your Aciphex 20 mg daily, 30 minutes prior to breakfast.  You may take famotidine or Tums over-the-counter as needed for any breakthrough symptoms, or if you know you are going to eat a trigger food. Follow a GERD diet:  Avoid fried, fatty, greasy, spicy, citrus foods. Avoid caffeine and carbonated beverages. Avoid chocolate. Try eating 4-6 small meals a day rather than 3 large meals. Do not eat within 3 hours of laying down. Prop head of bed up on wood or bricks to create a 6 inch incline.   Nutrition for cirrhosis:  High-protein diet from a primarily plant-based diet. Avoid red meat.  No raw or undercooked meat, seafood, or shellfish. Low-fat/cholesterol/carbohydrate diet. Limit sodium to no more than 2000 mg/day including everything that you eat and drink. Recommend at least 30 minutes of aerobic and resistance exercise 3 days/week. Adequate control of her blood sugar is also imperative given your liver disease.  For protection of your liver, hepatitis A and B vaccination is recommended and I strongly encourage you to have this completed. (It may be a good idea to set a reminder to follow-up on this after your colonoscopy).   We will place you on recall to contact you when it is time to complete your lab work and ultrasound -you are due in December.   We will have you follow-up in the office in about 2-3 months and attempt to get you back on a 44-monthschedule.   It was a pleasure to see you today. I want to create trusting relationships with patients. If you receive a survey regarding your visit,  I greatly appreciate you taking time to fill this out on paper or through your MyChart. I value your feedback.  CVenetia Night MSN, FNP-BC, AGACNP-BC [facility]

## 2022-01-07 ENCOUNTER — Ambulatory Visit: Payer: Medicare Other | Admitting: Gastroenterology

## 2022-01-10 NOTE — Telephone Encounter (Signed)
OV made °

## 2022-01-17 ENCOUNTER — Telehealth: Payer: Self-pay | Admitting: Nurse Practitioner

## 2022-01-17 NOTE — Telephone Encounter (Signed)
Noted, will wait to see readings before making that call

## 2022-01-17 NOTE — Telephone Encounter (Signed)
Ok, she can just take Metformin 500 mg po twice daily after meals for now and reach out about her glucose readings in about 2 weeks or so.

## 2022-01-17 NOTE — Telephone Encounter (Signed)
Patient said she can tolerate the '500mg'$  metformin but when she takes the 1000 mg, she is getting very sick on her stomach ( nausea, diarrhea). Please advise.

## 2022-01-18 NOTE — Telephone Encounter (Signed)
Talked with patient. Gave her your recommendation. She has interest in the [place]. She was going to call her pharmacy and have them run a test claim to see what the cost would be for her. She said that she would let us know , hopefully by Wednesday.  Patient has a EGD/TCS on this coming Thursday, she has concern about holding the Metformin prior to and day of procedure. She questions if she start the Ozempic and be on this for about a month or so, and just reschedule the procedure. Patient request your thoughts.

## 2022-01-18 NOTE — Telephone Encounter (Signed)
Call pt regarding her readings. Also, she is asking should she be taking vitamin B12, she is tired all the time

## 2022-01-18 NOTE — Telephone Encounter (Signed)
I cannot advise to the B12, she may need to speak with her PCP about getting that checked due to fatigue.  High glucose readings can also cause this fatigue.  Her recent fasting readings are upwards of 200 most of the time, based on the readings seen today (7-day average of 204, 14-day average of 196, 30-day average of 190, 90-day average of 180).  Since she cannot tolerate increased dose of Metformin, would she be open to trying another medication class like Ozempic?  We discussed this at last visit but she wanted to do more research on it first.

## 2022-01-21 ENCOUNTER — Telehealth: Payer: Self-pay | Admitting: *Deleted

## 2022-01-21 NOTE — Patient Instructions (Signed)
Wendy Kramer  [id number]     '@[email or web address]'$ @   Your procedure is scheduled on  01/24/2022.   Report to [facility] at  [place].M.   Call this number if you have problems the morning of surgery:  [phone]  If you experience any cold or flu symptoms such as cough, fever, chills, shortness of breath, etc. between now and your scheduled surgery, please notify us at the above number.   Remember:  Follow the diet and prep instructions given to you by the office.       Use your nebulizer and your inhaler before you come and bring your rescue inhaler with you.       DO NOT take any medications for diabetes the morning of your procedure.     Take these medicines the morning of surgery with A SIP OF WATER         xanax(if needed), flexeril(if needed), hydrocodone or ultram(if needed), aciphex.     Do not wear jewelry, make-up or nail polish.  Do not wear lotions, powders, or perfumes, or deodorant.  Do not shave 48 hours prior to surgery.  Men may shave face and neck.  Do not bring valuables to the hospital.  [facility] is not responsible for any belongings or valuables.  Contacts, dentures or bridgework may not be worn into surgery.  Leave your suitcase in the car.  After surgery it may be brought to your room.  For patients admitted to the hospital, discharge time will be determined by your treatment team.  Patients discharged the day of surgery will not be allowed to drive home and must have someone with them for 24 hours.    Special instructions:   DO NOT smoke tobacco or vape for 24 hours before your procedure.  Please read over the following fact sheets that you were given. Anesthesia Post-op Instructions and Care and Recovery After Surgery      Colonoscopy, Adult, Care After The following information offers guidance on how to care for yourself after your procedure. Your health care provider may also give you more specific instructions. If you have  problems or questions, contact your health care provider. What can I expect after the procedure? After the procedure, it is common to have: A small amount of blood in your stool for 24 hours after the procedure. Some gas. Mild cramping or bloating of your abdomen. Follow these instructions at home: Eating and drinking  Drink enough fluid to keep your urine pale yellow. Follow instructions from your health care provider about eating or drinking restrictions. Resume your normal diet as told by your health care provider. Avoid heavy or fried foods that are hard to digest. Activity Rest as told by your health care provider. Avoid sitting for a long time without moving. Get up to take short walks every 1-2 hours. This is important to improve blood flow and breathing. Ask for help if you feel weak or unsteady. Return to your normal activities as told by your health care provider. Ask your health care provider what activities are safe for you. Managing cramping and bloating  Try walking around when you have cramps or feel bloated. If directed, apply heat to your abdomen as told by your health care provider. Use the heat source that your health care provider recommends, such as a moist heat pack or a heating pad. Place a towel between your skin and the heat source. Leave the heat on for  20-30 minutes. Remove the heat if your skin turns bright red. This is especially important if you are unable to feel pain, heat, or cold. You have a greater risk of getting burned. General instructions If you were given a sedative during the procedure, it can affect you for several hours. Do not drive or operate machinery until your health care provider says that it is safe. For the first 24 hours after the procedure: Do not sign important documents. Do not drink alcohol. Do your regular daily activities at a slower pace than normal. Eat soft foods that are easy to digest. Take over-the-counter and prescription  medicines only as told by your health care provider. Keep all follow-up visits. This is important. Contact a health care provider if: You have blood in your stool 2-3 days after the procedure. Get help right away if: You have more than a small spotting of blood in your stool. You have large blood clots in your stool. You have swelling of your abdomen. You have nausea or vomiting. You have a fever. You have increasing pain in your abdomen that is not relieved with medicine. These symptoms may be an emergency. Get help right away. Call 911. Do not wait to see if the symptoms will go away. Do not drive yourself to the hospital. Summary After the procedure, it is common to have a small amount of blood in your stool. You may also have mild cramping and bloating of your abdomen. If you were given a sedative during the procedure, it can affect you for several hours. Do not drive or operate machinery until your health care provider says that it is safe. Get help right away if you have a lot of blood in your stool, nausea or vomiting, a fever, or increased pain in your abdomen. This information is not intended to replace advice given to you by your health care provider. Make sure you discuss any questions you have with your health care provider. Document Revised: 11/08/2020 Document Reviewed: 11/08/2020 Elsevier Patient Education  [place] After This sheet gives you information about how to care for yourself after your procedure. Your health care provider may also give you more specific instructions. If you have problems or questions, contact your health care provider. What can I expect after the procedure? After the procedure, it is common to have: Tiredness. Forgetfulness about what happened after the procedure. Impaired judgment for important decisions. Nausea or vomiting. Some difficulty with balance. Follow these instructions at home: For the time  period you were told by your health care provider:     Rest as needed. Do not participate in activities where you could fall or become injured. Do not drive or use machinery. Do not drink alcohol. Do not take sleeping pills or medicines that cause drowsiness. Do not make important decisions or sign legal documents. Do not take care of children on your own. Eating and drinking Follow the diet that is recommended by your health care provider. Drink enough fluid to keep your urine pale yellow. If you vomit: Drink water, juice, or soup when you can drink without vomiting. Make sure you have little or no nausea before eating solid foods. General instructions Have a responsible adult stay with you for the time you are told. It is important to have someone help care for you until you are awake and alert. Take over-the-counter and prescription medicines only as told by your health care provider. If you have sleep apnea,  surgery and certain medicines can increase your risk for breathing problems. Follow instructions from your health care provider about wearing your sleep device: Anytime you are sleeping, including during daytime naps. While taking prescription pain medicines, sleeping medicines, or medicines that make you drowsy. Avoid smoking. Keep all follow-up visits as told by your health care provider. This is important. Contact a health care provider if: You keep feeling nauseous or you keep vomiting. You feel light-headed. You are still sleepy or having trouble with balance after 24 hours. You develop a rash. You have a fever. You have redness or swelling around the IV site. Get help right away if: You have trouble breathing. You have new-onset confusion at home. Summary For several hours after your procedure, you may feel tired. You may also be forgetful and have poor judgment. Have a responsible adult stay with you for the time you are told. It is important to have someone help  care for you until you are awake and alert. Rest as told. Do not drive or operate machinery. Do not drink alcohol or take sleeping pills. Get help right away if you have trouble breathing, or if you suddenly become confused. This information is not intended to replace advice given to you by your health care provider. Make sure you discuss any questions you have with your health care provider. Document Revised: 02/20/2021 Document Reviewed: 02/18/2019 Elsevier Patient Education  [place].

## 2022-01-21 NOTE — Telephone Encounter (Signed)
Talked with the patient. She states that Dr. Sydell Axon 's office called her earlier this morning. Patient has decided to hold off on the EGD/TCS, concerned because she was told and it is also on her paperwork that she cannot take the Metformin. I ask that any question about the Metformin to call and speak with his nurse.

## 2022-01-21 NOTE — Telephone Encounter (Signed)
Received call from pt. She needs to cancelling procedure on for 10/26. Having issues with controlling blood sugars and blood pressure. Working with PCP to get this better under control. Patient will call back to reschedule once this is so.

## 2022-01-21 NOTE — Telephone Encounter (Signed)
She can continue on with the EGD/colonoscopy as planned, if they will do it with her readings being on the higher side.  It isn't too high where I would be worried about infection risk or anything.  She should receive specific instructions on what to take and what to temporarily hold from the doctor doing the procedure, but usually Metformin is safe to take as it does not cause low glucose (however it could cause upset stomach if she is taking it on an empty stomach).

## 2022-01-22 ENCOUNTER — Encounter (HOSPITAL_COMMUNITY): Payer: Self-pay

## 2022-01-22 ENCOUNTER — Encounter (HOSPITAL_COMMUNITY)
Admission: RE | Admit: 2022-01-22 | Discharge: 2022-01-22 | Disposition: A | Discharge status: Home / Self Care | Payer: Medicare Other | Source: Ambulatory Visit | Attending: Internal Medicine | Admitting: Internal Medicine

## 2022-01-22 DIAGNOSIS — E1165 Type 2 diabetes mellitus with hyperglycemia: Secondary | ICD-10-CM

## 2022-01-22 DIAGNOSIS — K7460 Unspecified cirrhosis of liver: Secondary | ICD-10-CM

## 2022-01-24 ENCOUNTER — Ambulatory Visit (HOSPITAL_COMMUNITY): Admission: RE | Admit: 2022-01-24 | Payer: Medicare Other | Source: Ambulatory Visit | Admitting: Internal Medicine

## 2022-01-24 ENCOUNTER — Encounter (HOSPITAL_COMMUNITY): Admission: RE | Payer: Self-pay | Source: Ambulatory Visit

## 2022-01-24 SURGERY — COLONOSCOPY WITH PROPOFOL
Anesthesia: Monitor Anesthesia Care

## 2022-02-04 ENCOUNTER — Other Ambulatory Visit: Payer: Self-pay | Admitting: Gastroenterology

## 2022-02-04 ENCOUNTER — Telehealth: Payer: Self-pay

## 2022-02-04 DIAGNOSIS — K219 Gastro-esophageal reflux disease without esophagitis: Secondary | ICD-10-CM

## 2022-02-04 DIAGNOSIS — M79601 Pain in right arm: Secondary | ICD-10-CM | POA: Diagnosis not present

## 2022-02-04 DIAGNOSIS — M545 Low back pain, unspecified: Secondary | ICD-10-CM | POA: Diagnosis not present

## 2022-02-04 DIAGNOSIS — I10 Essential (primary) hypertension: Secondary | ICD-10-CM | POA: Diagnosis not present

## 2022-02-04 DIAGNOSIS — M542 Cervicalgia: Secondary | ICD-10-CM | POA: Diagnosis not present

## 2022-02-04 DIAGNOSIS — M25561 Pain in right knee: Secondary | ICD-10-CM | POA: Diagnosis not present

## 2022-02-04 DIAGNOSIS — I679 Cerebrovascular disease, unspecified: Secondary | ICD-10-CM | POA: Diagnosis not present

## 2022-02-04 DIAGNOSIS — I6990 Unspecified sequelae of unspecified cerebrovascular disease: Secondary | ICD-10-CM | POA: Diagnosis not present

## 2022-02-04 DIAGNOSIS — Z79891 Long term (current) use of opiate analgesic: Secondary | ICD-10-CM | POA: Diagnosis not present

## 2022-02-04 MED ORDER — RABEPRAZOLE SODIUM 20 MG PO TBEC: 20.0000 mg | DELAYED_RELEASE_TABLET | Freq: Every day | ORAL | 3 refills | Status: DC

## 2022-02-04 NOTE — Telephone Encounter (Signed)
Refill request received from [facility] for Rabeprazole DR 20 mg tablets, once daily. Pt was last seen on 01/04/2022.

## 2022-02-05 DIAGNOSIS — E1165 Type 2 diabetes mellitus with hyperglycemia: Secondary | ICD-10-CM | POA: Diagnosis not present

## 2022-02-05 DIAGNOSIS — I10 Essential (primary) hypertension: Secondary | ICD-10-CM | POA: Diagnosis not present

## 2022-02-06 ENCOUNTER — Telehealth: Payer: Self-pay | Admitting: Internal Medicine

## 2022-02-06 NOTE — Telephone Encounter (Signed)
Needs an ultrasound scheduled (6 month recall)

## 2022-02-07 NOTE — Telephone Encounter (Signed)
[place] sent

## 2022-03-01 ENCOUNTER — Telehealth: Payer: Self-pay | Admitting: *Deleted

## 2022-03-01 ENCOUNTER — Other Ambulatory Visit: Payer: Self-pay | Admitting: *Deleted

## 2022-03-01 DIAGNOSIS — K7460 Unspecified cirrhosis of liver: Secondary | ICD-10-CM

## 2022-03-01 NOTE — Telephone Encounter (Signed)
Mailed lab requisitions to have completed for the month of December.  

## 2022-03-07 DIAGNOSIS — I10 Essential (primary) hypertension: Secondary | ICD-10-CM | POA: Diagnosis not present

## 2022-03-07 DIAGNOSIS — J449 Chronic obstructive pulmonary disease, unspecified: Secondary | ICD-10-CM | POA: Diagnosis not present

## 2022-03-28 ENCOUNTER — Ambulatory Visit: Payer: Medicare Other | Admitting: Nurse Practitioner

## 2022-04-12 ENCOUNTER — Telehealth: Payer: Self-pay | Admitting: Gastroenterology

## 2022-04-12 DIAGNOSIS — K7460 Unspecified cirrhosis of liver: Secondary | ICD-10-CM

## 2022-04-12 NOTE — Telephone Encounter (Signed)
Rescheduled her 1/15 appt to 1/30 and is supposed to have ultrasound a week before the appt.  She asked if you could reschedule that for her please

## 2022-04-15 ENCOUNTER — Ambulatory Visit: Payer: Medicare Other | Admitting: Gastroenterology

## 2022-04-16 NOTE — Telephone Encounter (Signed)
Called pt and made aware of appt details for [place]

## 2022-04-25 DIAGNOSIS — I10 Essential (primary) hypertension: Secondary | ICD-10-CM | POA: Diagnosis not present

## 2022-04-25 DIAGNOSIS — J449 Chronic obstructive pulmonary disease, unspecified: Secondary | ICD-10-CM | POA: Diagnosis not present

## 2022-04-25 DIAGNOSIS — E1165 Type 2 diabetes mellitus with hyperglycemia: Secondary | ICD-10-CM | POA: Diagnosis not present

## 2022-04-25 DIAGNOSIS — F17210 Nicotine dependence, cigarettes, uncomplicated: Secondary | ICD-10-CM | POA: Diagnosis not present

## 2022-04-25 DIAGNOSIS — F17200 Nicotine dependence, unspecified, uncomplicated: Secondary | ICD-10-CM | POA: Diagnosis not present

## 2022-04-25 DIAGNOSIS — E559 Vitamin D deficiency, unspecified: Secondary | ICD-10-CM | POA: Diagnosis not present

## 2022-04-26 ENCOUNTER — Ambulatory Visit (HOSPITAL_COMMUNITY)
Admission: RE | Admit: 2022-04-26 | Discharge: 2022-04-26 | Disposition: A | Discharge status: Home / Self Care | Payer: Medicare Other | Source: Ambulatory Visit | Attending: Internal Medicine | Admitting: Internal Medicine

## 2022-04-26 DIAGNOSIS — K7460 Unspecified cirrhosis of liver: Secondary | ICD-10-CM | POA: Diagnosis not present

## 2022-04-26 DIAGNOSIS — E559 Vitamin D deficiency, unspecified: Secondary | ICD-10-CM | POA: Diagnosis not present

## 2022-04-26 DIAGNOSIS — E538 Deficiency of other specified B group vitamins: Secondary | ICD-10-CM | POA: Diagnosis not present

## 2022-04-26 DIAGNOSIS — Z9049 Acquired absence of other specified parts of digestive tract: Secondary | ICD-10-CM | POA: Diagnosis not present

## 2022-04-27 LAB — COMPREHENSIVE METABOLIC PANEL
AG Ratio: 1.4 (calc) (ref 1.0–2.5)
ALT: 19 U/L (ref 6–29)
AST: 23 U/L (ref 10–35)
Albumin: 3.8 g/dL (ref 3.6–5.1)
Alkaline phosphatase (APISO): 101 U/L (ref 37–153)
BUN: 17 mg/dL (ref 7–25)
CO2: 27 mmol/L (ref 20–32)
Calcium: 9.2 mg/dL (ref 8.6–10.4)
Chloride: 103 mmol/L (ref 98–110)
Creat: 0.8 mg/dL (ref 0.50–1.05)
Globulin: 2.7 g/dL (calc) (ref 1.9–3.7)
Glucose, Bld: 188 mg/dL — ABNORMAL HIGH (ref 65–99)
Potassium: 4.4 mmol/L (ref 3.5–5.3)
Sodium: 140 mmol/L (ref 135–146)
Total Bilirubin: 0.4 mg/dL (ref 0.2–1.2)
Total Protein: 6.5 g/dL (ref 6.1–8.1)

## 2022-04-27 LAB — CBC
HCT: 33.7 % — ABNORMAL LOW (ref 35.0–45.0)
Hemoglobin: 10.1 g/dL — ABNORMAL LOW (ref 11.7–15.5)
MCH: 23.5 pg — ABNORMAL LOW (ref 27.0–33.0)
MCHC: 30 g/dL — ABNORMAL LOW (ref 32.0–36.0)
MCV: 78.6 fL — ABNORMAL LOW (ref 80.0–100.0)
MPV: 12.1 fL (ref 7.5–12.5)
Platelets: 194 10*3/uL (ref 140–400)
RBC: 4.29 10*6/uL (ref 3.80–5.10)
RDW: 13.8 % (ref 11.0–15.0)
WBC: 6.6 10*3/uL (ref 3.8–10.8)

## 2022-04-27 LAB — PROTIME-INR
INR: 1
Prothrombin Time: 10.3 s (ref 9.0–11.5)

## 2022-04-29 NOTE — Progress Notes (Unsigned)
GI Office Note    Referring Provider: Carrolyn Meiers* Primary Care Physician:  Carrolyn Meiers, MD Primary Gastroenterologist: Cristopher Estimable.[name], MD  Date:  05/27/3333  ID:  Wendy Kramer, DOB [birth year], MRN [id number]   Chief Complaint   Chief Complaint  Patient presents with   Follow-up    Lots of reflux the last couple days.     History of Present Illness  Wendy Kramer is a 62 y.o. female with a history of arthritis, diabetes, GERD, 44, HTN, stroke, kidney stones, colon polyps, NASH cirrhosis presenting today for cirrhosis follow-up.   EGD 03/05/2018: -Erosive reflux esophagitis -Small hiatal hernia -Erosive gastropathy s/p biopsy -Stomach biopsy with reactive gastropathy, negative H. Pylori  Colonoscopy 03/05/2018: -Sigmoid and descending diverticulosis -2 polyps in the rectum and descending colon -13 mm polyp in hepatic flexure -Pathology with hyperplastic and serrated polyps -Repeat TCS in 3 years.  Last office visit conducted virtually 01/04/2022.  Reflux fairly well-controlled however having occasional breakthrough symptoms usually in the evenings after dinner.  Working on eating less at dinnertime.  Denied any abdominal swelling, lower extreme edema, melena, BRBPR, confusion, nausea, pruritus, jaundice.  Reported mesh from umbilical hernia repair, fall around Mother's Day and began having pain in her abdomen.  Initially have improved until just before her visit.  Improved with ibuprofen and pain medication.  Reports she will receive hepatitis A and hepatitis B vaccinations after performing colonoscopy.  Patient canceled EGD and TCS due to concerns of holding metformin and her recently higher blood sugars.  Labs 04/26/2022: Hemoglobin 10.1, MCV 78.6, platelets 194, glucose 188, creatinine 0.8, sodium 140, albumin 3.8, T. bili 0.4, alk phos 101, AST 23, ALT 19, INR 1  RUQ [place] 04/26/2022: -Cirrhosis morphology of liver -Focal liver  lesion  Today: Cirrhosis history Hematemesis/coffee ground emesis: None History of variceal bleeding: No Abdominal pain: None other than recent epigastric discomfort Abdominal distention/worsening ascites: none Fever/chills: None Episodes of confusion/disorientation: None Number of daily bowel movements: 2 per day, has chronic looser stools and lighter brown Pruritus: None  Taking diuretics?: None Date of last EGD: 2019 (overdue) Prior history of banding?: No Prior episodes of SBP: No Last time liver imaging was performed: 04/26/22 - no focal liver lesion Has had some intermittent lower extremity edema related to her varicose veins.   Admits to not being able to adhere to a  2g sodium diet.   Hep A and B vaccination: wants new prescription.  MELD Na score: 6 (as of January 2024)  Repots endocrine mentioned starting her on GLP-1 in the near future. Reports metformin was affecting her with nausea, fatigue, and diarrhea.   Does some occasional shortness of breath and full body fatigue. Having more leg cramps and gets sporadic abdominal cramps. Top number has been climbing in regard to her BP.  No headaches or chest pain.  Has appointment with chronic pain related to her arthritis in 2 weeks.   GERD: Has had a couple episodes of nausea without vomiting. Thinks it is related to her pain. For 3 days she has increased her rabeprazole to twice daily. Does admit to starting back some more motrin for the last couple months. Takes it with her tramadol to help with pain.   Current Outpatient Medications  Medication Sig Dispense Refill   ACCU-CHEK GUIDE test strip USE TO CHECK BLOOD SUGARS ONCE DAILY 100 strip 1   Accu-Chek Softclix Lancets lancets Use as instructed to monitor glucose 1-2 times daily. 100 each 12  albuterol (PROVENTIL HFA;VENTOLIN HFA) 108 (90 Base) MCG/ACT inhaler Inhale 1 puff into the lungs every 6 (six) hours as needed for wheezing or shortness of breath.     ALPRAZolam  (XANAX) 0.25 MG tablet Take 0.125-0.25 mg by mouth daily as needed for anxiety.     Blood Glucose Monitoring Suppl (ACCU-CHEK GUIDE ME) w/Device KIT 1 Piece by Does not apply route as directed. 1 kit 0   budesonide-formoterol (SYMBICORT) 80-4.5 MCG/ACT inhaler Inhale 2 puffs into the lungs 2 (two) times daily.     fluticasone (FLONASE) 50 MCG/ACT nasal spray Place 1 spray into both nostrils daily as needed for allergies or rhinitis.      HYDROcodone-acetaminophen (NORCO/VICODIN) 5-325 MG tablet Take 1 tablet by mouth daily as needed for moderate pain.     ibuprofen (ADVIL) 600 MG tablet Take 600 mg by mouth every 8 (eight) hours as needed (pain.).     ipratropium-albuterol (DUONEB) 0.5-2.5 (3) MG/3ML SOLN Take 3 mLs by nebulization every 6 (six) hours as needed (shortness of breath).     lisinopril (ZESTRIL) 20 MG tablet Take 20 mg by mouth 2 (two) times daily.     metFORMIN (GLUCOPHAGE) 500 MG tablet Take 2 tablets (1,000 mg total) by mouth 2 (two) times daily with a meal. 180 tablet 3   Multiple Vitamins-Minerals (ONE DAILY MULTIVIT/IRON-FREE) TABS Take 1 tablet by mouth every evening.     sodium chloride (OCEAN) 0.65 % SOLN nasal spray Place 1 spray into both nostrils as needed for congestion.     traMADol (ULTRAM) 50 MG tablet Take 50-100 mg by mouth every 8 (eight) hours as needed for moderate pain.  3   cyclobenzaprine (FLEXERIL) 5 MG tablet Take 5 mg by mouth 3 (three) times daily as needed for muscle spasms. (Patient not taking: Reported on 04/30/2022)  0   RABEprazole (ACIPHEX) 20 MG tablet Take 1 tablet (20 mg total) by mouth 2 (two) times daily before a meal. 90 tablet 3   triamcinolone (KENALOG) 0.025 % cream Apply 1 Application topically as needed (rash). (Patient not taking: Reported on 04/30/2022)     No current facility-administered medications for this visit.    Past Medical History:  Diagnosis Date   Arthritis    Cardiac murmur    Cervical spine disease    Cirrhosis ([place])     likely secondary to NASH   Diabetes mellitus without complication (HCC)    diet controlled   Exogenous hyperlipidemia    GERD (gastroesophageal reflux disease)    History of kidney stones    HTN (hypertension)    Morbid obesity due to excess calories (HCC)    Other chronic pain    Panic disorder    Patent foramen ovale    TEE 2018   Prediabetes    Sleep apnea    Stroke ([place])    "mini-stroke" no deficits from this.   Unspecified asthma with (acute) exacerbation    Vitamin D deficiency     Past Surgical History:  Procedure Laterality Date   BIOPSY  03/05/2018   Procedure: BIOPSY;  Surgeon: Daneil Dolin, MD;  [facility];  Service: Endoscopy;;  gastric   CHOLECYSTECTOMY     COLONOSCOPY WITH PROPOFOL N/A 03/05/2018   Dr. Gala Romney: Diverticulosis in the sigmoid and descending colon.  3 colon polyps removed, 2 hyperplastic and one sessile serrated polyp.  Next colonoscopy 3 years.   ESOPHAGOGASTRODUODENOSCOPY (EGD) WITH PROPOFOL N/A 03/05/2018   Dr. Gala Romney: Erosive reflux esophagitis, small hiatal  hernia, erosive gastropathy with reactive gastropathy on biopsy.  No H. pylori.   HERNIA REPAIR     umbilical   POLYPECTOMY  03/05/2018   Procedure: POLYPECTOMY;  Surgeon: Daneil Dolin, MD;  Location: [facility];  Service: Endoscopy;;  colon   SHOULDER ARTHROSCOPY Right    TEE WITHOUT CARDIOVERSION  2018   patent foramen ovale   TONSILLECTOMY AND ADENOIDECTOMY     TUBAL LIGATION      Family History  Problem Relation Age of Onset   Breast cancer Maternal Aunt    Colon cancer Other        maternal great grandfather   Liver disease Brother        Fatty liver   HIV Brother    Hypertension Mother    Hyperlipidemia Mother    Hypertension Father    Thyroid disease Sister    Pancreatic cancer Sister    Healthy Daughter    Healthy Son    Healthy Son    Heart disease Son     Allergies as of 04/30/2022 - Review Complete 04/30/2022  Allergen Reaction Noted   Asa  [aspirin] Anaphylaxis 09/24/2016   Augmentin [amoxicillin-pot clavulanate] Other (See Comments) 09/24/2016   Levaquin [levofloxacin in d5w] Itching and Other (See Comments) 09/24/2016   Lovenox [enoxaparin sodium] Other (See Comments) 09/24/2016    Social History   Socioeconomic History   Marital status: Legally Separated    Spouse name: Not on file   Number of children: Not on file   Years of education: Not on file   Highest education level: Not on file  Occupational History   Not on file  Tobacco Use   Smoking status: Every Day    Packs/day: 0.50    Years: 40.00    Total pack years: 20.00    Types: Cigarettes   Smokeless tobacco: Never  Vaping Use   Vaping Use: Never used  Substance and Sexual Activity   Alcohol use: No   Drug use: No   Sexual activity: Not Currently    Birth control/protection: Post-menopausal, Surgical    Comment: tubal  Other Topics Concern   Not on file  Social History Narrative   Not on file   Social Determinants of Health   Financial Resource Strain: High Risk (10/06/2019)   Overall Financial Resource Strain (CARDIA)    Difficulty of Paying Living Expenses: Hard  Food Insecurity: No Food Insecurity (10/06/2019)   Hunger Vital Sign    Worried About Running Out of Food in the Last Year: Never true    Ran Out of Food in the Last Year: Never true  Transportation Needs: No Transportation Needs (10/06/2019)   PRAPARE - Hydrologist (Medical): No    Lack of Transportation (Non-Medical): No  Physical Activity: Inactive (10/06/2019)   Exercise Vital Sign    Days of Exercise per Week: 0 days    Minutes of Exercise per Session: 0 min  Stress: Stress Concern Present (10/06/2019)   [place]    Feeling of Stress : To some extent  Social Connections: Socially Isolated (10/06/2019)   Social Connection and Isolation Panel [NHANES]    Frequency of Communication with  Friends and Family: More than three times a week    Frequency of Social Gatherings with Friends and Family: More than three times a week    Attends Religious Services: Never    Marine scientist or Organizations: No  Attends Archivist Meetings: Never    Marital Status: Separated     Review of Systems   Gen: Denies fever, chills, anorexia. Denies fatigue, weakness, weight loss.  CV: Denies chest pain, palpitations, syncope, peripheral edema, and claudication. Resp: Denies dyspnea at rest, cough, wheezing, coughing up blood, and pleurisy. GI: See HPI Derm: Denies rash, itching, dry skin Psych: Denies depression, anxiety, memory loss, confusion. No homicidal or suicidal ideation.  Heme: Denies bruising, bleeding, and enlarged lymph nodes.   Physical Exam   BP (!) 145/74 (BP Location: Left Arm, Patient Position: Sitting, Cuff Size: Large) Comment: BP recheck  Pulse 80   Temp (!) 97.4 F (36.3 C) (Temporal)   Ht '5\' 3"'$  (1.6 m)   Wt 231 lb 12.8 oz (105.1 kg)   LMP 09/06/2016   SpO2 97%   BMI 41.06 kg/m   General:   Alert and oriented. No distress noted. Pleasant and cooperative.  Head:  Normocephalic and atraumatic. Eyes:  Conjuctiva clear without scleral icterus. Mouth:  Oral mucosa pink and moist. Good dentition. No lesions. Lungs:  Clear to auscultation bilaterally. No wheezes, rales, or rhonchi. No distress.  Heart:  S1, S2 present without murmurs appreciated.  Abdomen:  +BS, soft, non-tender and non-distended. No rebound or guarding. No HSM or masses noted. Rectal: deferred Msk:  Symmetrical without gross deformities. Normal posture. Extremities:  Without edema. Neurologic:  Alert and  oriented x4 Psych:  Alert and cooperative. Normal mood and affect.   Assessment  Wendy Kramer is a 62 y.o. female with a history of arthritis, diabetes, GERD, 19, HTN, stroke, kidney stones, colon polyps, NASH cirrhosis presenting today for cirrhosis follow-up.    NASH  cirrhosis: Well compensated.  MELD sodium 6 with most recent labs.  Ultrasound up-to-date this month without any focal liver lesion.  Plan to repeat right upper quadrant ultrasound and labs in 6 months.  Does have some evidence of anemia as outlined below.  Continues to have issues with blood sugar control.  Not adhering to a low-sodium diet given financial and transportation issues.  Still in need of hepatitis A and hepatitis B vaccination.  Last EGD in 2019 without varices.  Currently overdue for surveillance.  Recently scheduled for EGD late last year however canceled due to ongoing elevated blood sugars for which she is following with endocrinology.  Having side effects with metformin.  May possibly be started on GLP-1 in the near future.  Denies any pruritus, mental status change/confusion, ascites, peripheral edema.  Discussed rescheduling EGD for variceal surveillance she has agreed to possibly have this performed in March.  New orders placed today.  Cirrhosis nutrition reinforced.  IDA: Hemoglobin dropped to 10.1 on most recent labs.  Baseline appears to be more in the 14-15 range.  Has been experiencing some fatigue as well as some leg cramps.  Has some occasional shortness of breath but usually with activity.  Denies any melena, BRBPR, unintentional weight loss.  Will check iron panel today.  Advised to start daily oral iron supplement.  As stated above overdue for EGD and she is also overdue for colonoscopy given her history of colon polyps.  We will proceed with scheduling in the very near future.  GERD: Couple episodes of nausea without vomiting however feels like it is related to her intermittent arthritic pain given she has been without her tramadol while she waits for new chronic pain doctor.  3 days ago she began increasing her rabeprazole to twice daily and has had  some improvement.  Does admit to occasional Motrin use.  Will update prescription to reflect rabeprazole 20 mg twice daily in the  setting of uncontrolled reflux.  Advised to take famotidine 20 mg nightly as needed for breakthrough.  History of colon polyps: No alarm symptoms present.  Does have anemia as stated above.  TCS originally scheduled for the end of October 2023 however canceled given issues with hyperglycemia.  Planning to reschedule for March 2024.  New orders placed.  HTN: The patient was found to have elevated blood pressure when vital signs were checked in the office (153/66). The blood pressure was rechecked by the nursing staff (145/74) and it was found be persistently elevated >140/90 mmHg. I personally advised to the patient to follow up closely with PCP for hypertension control.   PLAN   RUQ [place] in 6 months Iron panel CBC, CMP, INR, AFP in 6 months Start oral daily iron supplement.  Increase Rabeprazole to 20 mg BID GERD diet/lifestyle modifications Start famotidine 20 mg as needed for breakthrough.  High-protein diet from a primarily plant-based diet. Avoid red meat.  No raw or undercooked meat, seafood, or shellfish. Low-fat/cholesterol/carbohydrate diet. Limit sodium to no more than 2000 mg/day including everything that you eat and drink. Recommend at least 30 minutes of aerobic and resistance exercise 3 days/week. New prescription for Hep A and B vaccination Reschedule EGD/TCS with Dr. Gala Romney: ASA 3.  I have discussed the risks, alternatives, benefits with regards to but not limited to the risk of reaction to medication, bleeding, infection, perforation and the patient is agreeable to proceed. Written consent to be obtained. (Plan for March 2024) Follow-up with PCP for blood pressure management  Follow-up in the office in 6 months, sooner if needed.    Venetia Night, MSN, FNP-BC, AGACNP-BC [facility]

## 2022-04-30 ENCOUNTER — Encounter: Payer: Self-pay | Admitting: Gastroenterology

## 2022-04-30 ENCOUNTER — Ambulatory Visit (INDEPENDENT_AMBULATORY_CARE_PROVIDER_SITE_OTHER): Payer: Medicare Other | Admitting: Gastroenterology

## 2022-04-30 VITALS — BP 145/74 | HR 80 | Temp 97.4°F | Ht 63.0 in | Wt 231.8 lb

## 2022-04-30 DIAGNOSIS — D509 Iron deficiency anemia, unspecified: Secondary | ICD-10-CM | POA: Diagnosis not present

## 2022-04-30 DIAGNOSIS — K7460 Unspecified cirrhosis of liver: Secondary | ICD-10-CM | POA: Diagnosis not present

## 2022-04-30 DIAGNOSIS — Z86010 Personal history of colonic polyps: Secondary | ICD-10-CM

## 2022-04-30 DIAGNOSIS — I10 Essential (primary) hypertension: Secondary | ICD-10-CM | POA: Diagnosis not present

## 2022-04-30 DIAGNOSIS — K219 Gastro-esophageal reflux disease without esophagitis: Secondary | ICD-10-CM | POA: Diagnosis not present

## 2022-04-30 MED ORDER — RABEPRAZOLE SODIUM 20 MG PO TBEC: 20.0000 mg | DELAYED_RELEASE_TABLET | Freq: Two times a day (BID) | ORAL | 3 refills | Status: DC

## 2022-04-30 NOTE — Patient Instructions (Addendum)
We will plan to schedule you for your upper endoscopy and colonoscopy in March per your request.  I will provide them with the orders and they will reach out to give you a call when we have the schedule open.  It is imperative that if you have any medication changes in regards to your diabetes medication or get started on a blood thinner that you notify us to make the appropriate adjustments.  With evidence of anemia this could be contributing to your shortness of breath, fatigue, and muscle cramping.  I want you to start taking a daily iron supplement.  I am ordering lab work for you to have completed with your next blood draw.  We will plan to follow-up again in 6 months.  I will provide you with a new prescription for hepatitis A and hepatitis B vaccine.  For your reflux you may take rabeprazole 20 mg twice daily.  I have sent in a new prescription for you.  You may also use famotidine 20 mg as needed for breakthrough.  Follow a GERD diet:  Avoid fried, fatty, greasy, spicy, citrus foods. Avoid caffeine and carbonated beverages. Avoid chocolate. Try eating 4-6 small meals a day rather than 3 large meals. Do not eat within 3 hours of laying down. Prop head of bed up on wood or bricks to create a 6 inch incline.  Cirrhosis Nutrition:  High-protein diet from a primarily plant-based diet. Avoid red meat.  No raw or undercooked meat, seafood, or shellfish. Low-fat/cholesterol/carbohydrate diet. Limit sodium to no more than 2000 mg/day including everything that you eat and drink. Recommend at least 30 minutes of aerobic and resistance exercise 3 days/week.  It was a pleasure to see you today. I want to create trusting relationships with patients. If you receive a survey regarding your visit,  I greatly appreciate you taking time to fill this out on paper or through your MyChart. I value your feedback.  Venetia Night, MSN, FNP-BC, AGACNP-BC [facility]

## 2022-05-01 ENCOUNTER — Other Ambulatory Visit: Payer: Self-pay | Admitting: Gastroenterology

## 2022-05-01 DIAGNOSIS — E1165 Type 2 diabetes mellitus with hyperglycemia: Secondary | ICD-10-CM | POA: Diagnosis not present

## 2022-05-01 DIAGNOSIS — D509 Iron deficiency anemia, unspecified: Secondary | ICD-10-CM | POA: Diagnosis not present

## 2022-05-02 ENCOUNTER — Other Ambulatory Visit: Payer: Self-pay | Admitting: *Deleted

## 2022-05-02 ENCOUNTER — Telehealth: Payer: Self-pay | Admitting: *Deleted

## 2022-05-02 DIAGNOSIS — D649 Anemia, unspecified: Secondary | ICD-10-CM

## 2022-05-02 DIAGNOSIS — D509 Iron deficiency anemia, unspecified: Secondary | ICD-10-CM

## 2022-05-02 LAB — TSH: TSH: 1.56 mIU/L (ref 0.40–4.50)

## 2022-05-02 LAB — COMPREHENSIVE METABOLIC PANEL
AG Ratio: 1.5 (calc) (ref 1.0–2.5)
ALT: 20 U/L (ref 6–29)
AST: 28 U/L (ref 10–35)
Albumin: 4.1 g/dL (ref 3.6–5.1)
Alkaline phosphatase (APISO): 109 U/L (ref 37–153)
BUN: 13 mg/dL (ref 7–25)
CO2: 27 mmol/L (ref 20–32)
Calcium: 9.2 mg/dL (ref 8.6–10.4)
Chloride: 103 mmol/L (ref 98–110)
Creat: 0.69 mg/dL (ref 0.50–1.05)
Globulin: 2.7 g/dL (calc) (ref 1.9–3.7)
Glucose, Bld: 181 mg/dL — ABNORMAL HIGH (ref 65–99)
Potassium: 4.4 mmol/L (ref 3.5–5.3)
Sodium: 139 mmol/L (ref 135–146)
Total Bilirubin: 0.5 mg/dL (ref 0.2–1.2)
Total Protein: 6.8 g/dL (ref 6.1–8.1)

## 2022-05-02 LAB — IRON,TIBC AND FERRITIN PANEL
%SAT: 7 % (calc) — ABNORMAL LOW (ref 16–45)
Ferritin: 7 ng/mL — ABNORMAL LOW (ref 16–288)
Iron: 32 ug/dL — ABNORMAL LOW (ref 45–160)
TIBC: 459 mcg/dL (calc) — ABNORMAL HIGH (ref 250–450)

## 2022-05-02 LAB — T4, FREE: Free T4: 1.2 ng/dL (ref 0.8–1.8)

## 2022-05-02 NOTE — Telephone Encounter (Signed)
Pt was approved for Rabeprazole from 05/01/2022 until further notice.

## 2022-05-08 ENCOUNTER — Telehealth: Payer: Self-pay | Admitting: *Deleted

## 2022-05-08 NOTE — Patient Outreach (Signed)
  Care Coordination   Initial Visit Note   07/08/6757 Name: Wendy Kramer MRN: [id number] DOB: [birth year]  Wendy Kramer is a 62 y.o. year old female who sees Wendy Kramer, Wendy Baxter, MD for primary care. I spoke with  Wendy Kramer by phone today.  What matters to the patients health and wellness today?  Continuous efforts to manage my DM. Patient requests call back in a couple of weeks. She would like to talk to her endocrinologist about her participation.   Goals Addressed   None     SDOH assessments and interventions completed:  No   Care Coordination Interventions:  No, not indicated   Follow up plan: Follow up call scheduled for MARCH 1st.    Encounter Outcome:  Pt. Scheduled   Aleksey Newbern C. Myrtie Neither, MSN, [facility] Gerontological Nurse Practitioner [facility] Care Management [phone]

## 2022-05-10 ENCOUNTER — Telehealth: Payer: Self-pay | Admitting: *Deleted

## 2022-05-10 NOTE — Telephone Encounter (Signed)
[facility]  EGD/TCS ASA 3 w/Dr.rourk

## 2022-05-21 ENCOUNTER — Telehealth: Payer: Self-pay | Admitting: *Deleted

## 2022-05-21 ENCOUNTER — Other Ambulatory Visit: Payer: Self-pay | Admitting: *Deleted

## 2022-05-21 DIAGNOSIS — D649 Anemia, unspecified: Secondary | ICD-10-CM

## 2022-05-21 DIAGNOSIS — D509 Iron deficiency anemia, unspecified: Secondary | ICD-10-CM

## 2022-05-21 NOTE — Telephone Encounter (Signed)
Mailed lab requisitions to have completed in March.

## 2022-05-26 DIAGNOSIS — J449 Chronic obstructive pulmonary disease, unspecified: Secondary | ICD-10-CM | POA: Diagnosis not present

## 2022-05-26 DIAGNOSIS — I10 Essential (primary) hypertension: Secondary | ICD-10-CM | POA: Diagnosis not present

## 2022-05-30 ENCOUNTER — Encounter: Payer: Self-pay | Admitting: Radiology

## 2022-05-31 ENCOUNTER — Telehealth: Payer: Self-pay | Admitting: *Deleted

## 2022-05-31 ENCOUNTER — Encounter: Payer: Self-pay | Admitting: *Deleted

## 2022-06-04 ENCOUNTER — Telehealth: Payer: Self-pay | Admitting: *Deleted

## 2022-06-04 ENCOUNTER — Encounter: Payer: Self-pay | Admitting: *Deleted

## 2022-06-04 NOTE — Patient Outreach (Signed)
  Care Coordination   Initial Visit Note HEALTH EQUITY PLAN PATIENT   [id number] Name: Wendy Kramer MRN: [id number] DOB: [id number]  Wendy Kramer is a 62 y.o. year old female who sees [name], [name], MD for primary care. I spoke with  [name] by phone today.  What matters to the patients health and wellness today?  I HAVE A LOT OF HEALTH ISSUES. MY DIABETES IS NOT IN GOOD CONTROL.    Goals Addressed               This Visit's Progress     Patient Stated     I would like to reduce my HgbA1C by 1.0% over the next 3 months. Current value is 9.6 on 12/28/22. (pt-stated)        . Interventions Today    Flowsheet Row Most Recent Value  Chronic Disease   Chronic disease during today's visit Diabetes  General Interventions   General Interventions Discussed/Reviewed General Interventions Discussed, General Interventions Reviewed, Annual Eye Exam, Labs, Annual Foot Exam, Lipid Profile, Durable Medical Equipment (DME), Community Resources, Doctor Visits  Labs Hgb A1c every 3 months  Doctor Visits Discussed/Reviewed Doctor Visits Discussed, Doctor Visits Reviewed, Annual Wellness Visits, PCP, Specialist  Durable Medical Equipment (DME) Glucomoter  [Pt would benefit from getting a CGM.]  PCP/Specialist Visits Compliance with follow-up visit  Exercise Interventions   Exercise Discussed/Reviewed Exercise Discussed, Exercise Reviewed, Physical Activity, Weight Managment  Physical Activity Discussed/Reviewed Physical Activity Discussed  Weight Management Weight loss  Education Interventions   Education Provided Provided Printed Education, Provided Education  Provided Verbal Education On Nutrition, Foot Care, Eye Care, Labs, Blood Sugar Monitoring, Exercise, Medication  Labs Reviewed Hgb A1c, Kidney Function, Lipid Profile  Mental Health Interventions   Mental Health Discussed/Reviewed Mental Health Discussed  Nutrition Interventions   Nutrition Discussed/Reviewed Nutrition  Discussed, Carbohydrate meal planning, Portion sizes  Pharmacy Interventions   Pharmacy Dicussed/Reviewed Medication Adherence  Safety Interventions   Safety Discussed/Reviewed Safety Discussed  Advanced Directive Interventions   Advanced Directives Discussed/Reviewed Advanced Directives Discussed             SDOH assessments and interventions completed:  Yes  SDOH Interventions Today    Flowsheet Row Most Recent Value  SDOH Interventions   Food Insecurity Interventions Intervention Not Indicated  Housing Interventions Intervention Not Indicated  Transportation Interventions Intervention Not Indicated  Utilities Interventions Intervention Not Indicated  Alcohol Usage Interventions Intervention Not Indicated (Score <7)  Financial Strain Interventions Intervention Not Indicated  Physical Activity Interventions Intervention Not Indicated  Stress Interventions Intervention Not Indicated  Social Connections Interventions Intervention Not Indicated        Care Coordination Interventions:  Yes, provided   Follow up plan: Follow up call scheduled for 2 weeks.    Encounter Outcome:  Pt. Visit Completed   Kayleen Memos C. Myrtie Neither, MSN, [facility] Gerontological Nurse Practitioner [facility] Care Management [phone]

## 2022-06-11 ENCOUNTER — Ambulatory Visit: Payer: Medicare Other | Admitting: Nurse Practitioner

## 2022-06-17 ENCOUNTER — Ambulatory Visit: Payer: Self-pay | Admitting: *Deleted

## 2022-06-17 NOTE — Patient Outreach (Signed)
  Care Coordination   Initial Visit Note   06/17/2022 - Late note for 05/31/22. Unable to connect to internet in the field. Name: Wilhemena Arian MRN: [id number] DOB: [id number]  Kalea Whitis is a 63 y.o. year old female who sees [name], [name], MD for primary care. I spoke with  [name] by phone today.  What matters to the patients health and wellness today?  Establishing my health care priorities    SDOH assessments and interventions completed:  No     Care Coordination Interventions:  Yes, provided    Pt agreed to participate in [place].  Follow up plan: Follow up call scheduled for 06/04/22    Encounter Outcome:  Pt. Visit Completed but brief phone call.   Eulah Pont. Myrtie Neither, MSN, [facility] Gerontological Nurse Practitioner [facility] Care Management [phone]

## 2022-06-17 NOTE — Patient Outreach (Signed)
  Care Coordination   [id number] Name: Wendy Kramer MRN: [id number] DOB: September 26, 1960   Care Coordination Outreach Attempts:  An unsuccessful telephone outreach was attempted today to offer the patient information about available care coordination services as a benefit of their health plan.   Follow Up Plan:  Additional outreach attempts will be made to offer the patient care coordination information and services.   Encounter Outcome:  No Answer   Care Coordination Interventions:  No, not indicated    Wendy Kramer C. Myrtie Neither, MSN, [facility] Gerontological Nurse Practitioner [facility] Care Management [phone]

## 2022-06-24 ENCOUNTER — Encounter: Payer: Self-pay | Admitting: Nurse Practitioner

## 2022-06-24 ENCOUNTER — Ambulatory Visit (INDEPENDENT_AMBULATORY_CARE_PROVIDER_SITE_OTHER): Payer: Medicare Other | Admitting: Nurse Practitioner

## 2022-06-24 VITALS — BP 140/82 | HR 80 | Ht 63.0 in | Wt 233.2 lb

## 2022-06-24 DIAGNOSIS — E1165 Type 2 diabetes mellitus with hyperglycemia: Secondary | ICD-10-CM | POA: Diagnosis not present

## 2022-06-24 DIAGNOSIS — E559 Vitamin D deficiency, unspecified: Secondary | ICD-10-CM | POA: Diagnosis not present

## 2022-06-24 DIAGNOSIS — J449 Chronic obstructive pulmonary disease, unspecified: Secondary | ICD-10-CM | POA: Diagnosis not present

## 2022-06-24 DIAGNOSIS — E782 Mixed hyperlipidemia: Secondary | ICD-10-CM

## 2022-06-24 DIAGNOSIS — I10 Essential (primary) hypertension: Secondary | ICD-10-CM | POA: Diagnosis not present

## 2022-06-24 LAB — POCT GLYCOSYLATED HEMOGLOBIN (HGB A1C): Hemoglobin A1C: 10 % — AB (ref 4.0–5.6)

## 2022-06-24 MED ORDER — METFORMIN HCL ER 500 MG PO TB24: 1000.0000 mg | ORAL_TABLET | Freq: Every day | ORAL | 3 refills | Status: DC

## 2022-06-24 NOTE — Progress Notes (Signed)
06/24/2022, 9:55 AM   Endocrinology follow-up note  Subjective:    Patient ID: Wendy Kramer, female    DOB: [birth year].  Wendy Kramer is being seen in follow-up after she was seen in consultation for management of currently uncontrolled symptomatic diabetes requested by  Carrolyn Meiers, MD.   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   Unspecified asthma with (acute) exacerbation    Vitamin D deficiency     Past Surgical History:  Procedure Laterality Date   BIOPSY  03/05/2018   Procedure: BIOPSY;  Surgeon: Daneil Dolin, MD;  [facility];  Service: Endoscopy;;  gastric   CHOLECYSTECTOMY     COLONOSCOPY WITH PROPOFOL N/A 03/05/2018   Dr. Gala Romney: Diverticulosis in the sigmoid and descending colon.  3 colon polyps removed, 2 hyperplastic and one sessile serrated polyp.  Next colonoscopy 3 years.   ESOPHAGOGASTRODUODENOSCOPY (EGD) WITH PROPOFOL N/A 03/05/2018   Dr. Gala Romney: Erosive reflux esophagitis, small hiatal hernia, erosive gastropathy with reactive gastropathy on biopsy.  No H. pylori.   HERNIA REPAIR     umbilical   POLYPECTOMY  03/05/2018   Procedure: POLYPECTOMY;  Surgeon: Daneil Dolin, MD;  [facility];  Service: Endoscopy;;  colon   SHOULDER ARTHROSCOPY Right    TEE WITHOUT CARDIOVERSION  2018   patent foramen ovale   TONSILLECTOMY AND ADENOIDECTOMY      TUBAL LIGATION      Social History   Socioeconomic History   Marital status: Legally Separated    Spouse name: Not on file   Number of children: Not on file   Years of education: Not on file   Highest education level: Not on file  Occupational History   Not on file  Tobacco Use   Smoking status: Every Day    Packs/day: 0.50    Years: 40.00    Additional pack years: 0.00    Total pack years: 20.00    Types: Cigarettes   Smokeless tobacco: Never  Vaping Use   Vaping Use: Never used  Substance and Sexual Activity   Alcohol use: No   Drug use: No   Sexual activity: Not Currently    Birth control/protection: Post-menopausal, Surgical    Comment: tubal  Other Topics Concern   Not on file  Social History Narrative   Not on file   Social Determinants of Health   Financial Resource Strain: Medium Risk (06/04/2022)   Overall Financial Resource  Strain (CARDIA)    Difficulty of Paying Living Expenses: Somewhat hard  Food Insecurity: No Food Insecurity (06/04/2022)   Hunger Vital Sign    Worried About Running Out of Food in the Last Year: Never true    Ran Out of Food in the Last Year: Never true  Transportation Needs: No Transportation Needs (06/04/2022)   PRAPARE - Hydrologist (Medical): No    Lack of Transportation (Non-Medical): No  Physical Activity: Insufficiently Active (06/04/2022)   Exercise Vital Sign    Days of Exercise per Week: 3 days    Minutes of Exercise per Session: 30 min  Stress: No Stress Concern Present (06/04/2022)   [place]    Feeling of Stress : Only a little  Social Connections: Socially Isolated (06/04/2022)   Social Connection and Isolation Panel [NHANES]    Frequency of Communication with Friends and Family: More than three times a week    Frequency of Social Gatherings with Friends and Family: More than three times a week    Attends Religious Services: Never     Marine scientist or Organizations: No    Attends Music therapist: Never    Marital Status: Separated    Family History  Problem Relation Age of Onset   Breast cancer Maternal Aunt    Colon cancer Other        maternal great grandfather   Liver disease Brother        Fatty liver   HIV Brother    Hypertension Mother    Hyperlipidemia Mother    Hypertension Father    Thyroid disease Sister    Pancreatic cancer Sister    Healthy Daughter    Healthy Son    Healthy Son    Heart disease Son     Outpatient Encounter Medications as of 06/24/2022  Medication Sig   ACCU-CHEK GUIDE test strip USE TO CHECK BLOOD SUGARS ONCE DAILY   Accu-Chek Softclix Lancets lancets Use as instructed to monitor glucose 1-2 times daily.   albuterol (PROVENTIL HFA;VENTOLIN HFA) 108 (90 Base) MCG/ACT inhaler Inhale 1 puff into the lungs every 6 (six) hours as needed for wheezing or shortness of breath.   ALPRAZolam (XANAX) 0.25 MG tablet Take 0.125-0.25 mg by mouth daily as needed for anxiety.   Blood Glucose Monitoring Suppl (ACCU-CHEK GUIDE ME) w/Device KIT 1 Piece by Does not apply route as directed.   budesonide-formoterol (SYMBICORT) 80-4.5 MCG/ACT inhaler Inhale 2 puffs into the lungs 2 (two) times daily.   cyclobenzaprine (FLEXERIL) 5 MG tablet Take 5 mg by mouth 3 (three) times daily as needed for muscle spasms.   fluticasone (FLONASE) 50 MCG/ACT nasal spray Place 1 spray into both nostrils daily as needed for allergies or rhinitis.    HYDROcodone-acetaminophen (NORCO/VICODIN) 5-325 MG tablet Take 1 tablet by mouth daily as needed for moderate pain.   ibuprofen (ADVIL) 600 MG tablet Take 600 mg by mouth every 8 (eight) hours as needed (pain.).   lisinopril (ZESTRIL) 20 MG tablet Take 20 mg by mouth 2 (two) times daily.   metFORMIN (GLUCOPHAGE-XR) 500 MG 24 hr tablet Take 2 tablets (1,000 mg total) by mouth at bedtime.   Multiple Vitamins-Minerals (ONE DAILY MULTIVIT/IRON-FREE)  TABS Take 1 tablet by mouth every evening.   RABEprazole (ACIPHEX) 20 MG tablet Take 1 tablet (20 mg total) by mouth 2 (two) times daily before a meal.   sodium chloride (OCEAN)  0.65 % SOLN nasal spray Place 1 spray into both nostrils as needed for congestion.   traMADol (ULTRAM) 50 MG tablet Take 50-100 mg by mouth every 8 (eight) hours as needed for moderate pain.   triamcinolone (KENALOG) 0.025 % cream Apply 1 Application topically as needed (rash).   [DISCONTINUED] metFORMIN (GLUCOPHAGE) 500 MG tablet Take 2 tablets (1,000 mg total) by mouth 2 (two) times daily with a meal.   ipratropium-albuterol (DUONEB) 0.5-2.5 (3) MG/3ML SOLN Take 3 mLs by nebulization every 6 (six) hours as needed (shortness of breath). (Patient not taking: Reported on 06/04/2022)   No facility-administered encounter medications on file as of 06/24/2022.    ALLERGIES: Allergies  Allergen Reactions   Asa [Aspirin] Anaphylaxis    Tolerates other NSAIDS   Augmentin [Amoxicillin-Pot Clavulanate] Other (See Comments)    "Retching and pass out"   Levaquin [Levofloxacin In D5w] Itching and Other (See Comments)    "body turned red"   Lovenox [Enoxaparin Sodium] Other (See Comments)    phsyician instructed not to use ever again. "white veins popped out"    VACCINATION STATUS:  There is no immunization history on file for this patient.  Diabetes She presents for her follow-up diabetic visit. She has type 2 diabetes mellitus. Onset time: She was diagnosed at approximate age of 45 years, after several years of prediabetes. Her disease course has been worsening. There are no hypoglycemic associated symptoms. Pertinent negatives for hypoglycemia include no confusion, headaches, pallor or seizures. Pertinent negatives for diabetes include no chest pain, no fatigue, no polydipsia, no polyphagia and no polyuria. There are no hypoglycemic complications. Symptoms are stable. Diabetic complications include a CVA. Risk factors for  coronary artery disease include diabetes mellitus, dyslipidemia, family history, obesity, tobacco exposure, post-menopausal, sedentary lifestyle and hypertension. Current diabetic treatment includes oral agent (monotherapy). She is compliant with treatment some of the time (lowered down to 500 mg po BID herself due to GI SE). Her weight is fluctuating minimally. She is following a generally unhealthy diet. When asked about meal planning, she reported none. She has not had a previous visit with a dietitian. She never participates in exercise. Her home blood glucose trend is increasing steadily. Her overall blood glucose range is 180-200 mg/dl. (She presents today with her meter, no logs, showing above target fasting glycemic profile.  Her POCT A1c today is 10%, increasing from last visit of 9.6%.  She had previously reduced her Metformin back to 500 mg po twice daily due to GI side effects.  She has also considered my offer of GLP1 but she is concerned both with cost and possible side effects.  Analysis of her meter shows 7-day average of 194, 14-day average of 197, 30-day average of 201, 90-day average of 198.) An ACE inhibitor/angiotensin II receptor blocker is being taken. She does not see a podiatrist.Eye exam is current.  Hyperlipidemia This is a chronic problem. The current episode started more than 1 year ago. Factors aggravating her hyperlipidemia include fatty foods. Pertinent negatives include no chest pain, myalgias or shortness of breath. She is currently on no antihyperlipidemic treatment. Compliance problems include adherence to diet and adherence to exercise.  Risk factors for coronary artery disease include diabetes mellitus, hypertension, obesity, post-menopausal and a sedentary lifestyle.  Hypertension This is a chronic problem. The current episode started more than 1 year ago. The problem has been waxing and waning since onset. The problem is uncontrolled. Pertinent negatives include no chest  pain, headaches, palpitations or shortness of  breath. There are no associated agents to hypertension. Risk factors for coronary artery disease include dyslipidemia, diabetes mellitus, obesity, smoking/tobacco exposure, sedentary lifestyle, post-menopausal state and family history. Past treatments include ACE inhibitors and diuretics. The current treatment provides moderate improvement. Compliance problems include diet and exercise.  Hypertensive end-organ damage includes CVA. Identifiable causes of hypertension include sleep apnea.    Review of systems  Constitutional: + Minimally fluctuating body weight,  current Body mass index is 41.31 kg/m. , no fatigue, no subjective hyperthermia, no subjective hypothermia Eyes: no blurry vision, no xerophthalmia ENT: no sore throat, no nodules palpated in throat, no dysphagia/odynophagia, no hoarseness Cardiovascular: no chest pain, no shortness of breath, no palpitations, no leg swelling Respiratory: no cough, no shortness of breath Gastrointestinal: no nausea/vomiting/diarrhea Musculoskeletal: no muscle/joint aches Skin: no rashes, no hyperemia Neurological: no tremors, no numbness, no tingling, no dizziness Psychiatric: no depression, no anxiety   Objective:    BP (!) 140/82 (BP Location: Left Arm, Patient Position: Sitting, Cuff Size: Large) Comment: Retake with manuel cuff  Pulse 80   Ht 5\' 3"  (1.6 m)   Wt 233 lb 3.2 oz (105.8 kg)   LMP 09/06/2016   BMI 41.31 kg/m   Wt Readings from Last 3 Encounters:  06/24/22 233 lb 3.2 oz (105.8 kg)  04/30/22 231 lb 12.8 oz (105.1 kg)  01/04/22 232 lb (105.2 kg)   BP Readings from Last 3 Encounters:  06/24/22 (!) 140/82  04/30/22 (!) 145/74  12/27/21 (!) 165/82     Physical Exam- Limited  Constitutional:  Body mass index is 41.31 kg/m. , not in acute distress, normal state of mind Eyes:  EOMI, no exophthalmos Musculoskeletal: no gross deformities, strength intact in all four extremities, no  gross restriction of joint movements Skin:  no rashes, no hyperemia Neurological: no tremor with outstretched hands   Diabetic Foot Exam - Simple   No data filed     CMP ( most recent) CMP     Component Value Date/Time   NA 139 05/01/2022 1009   NA 142 02/15/2020 1018   K 4.4 05/01/2022 1009   CL 103 05/01/2022 1009   CO2 27 05/01/2022 1009   GLUCOSE 181 (H) 05/01/2022 1009   BUN 13 05/01/2022 1009   BUN 12 02/15/2020 1018   CREATININE 0.69 05/01/2022 1009   CALCIUM 9.2 05/01/2022 1009   PROT 6.8 05/01/2022 1009   PROT 7.2 02/15/2020 1018   ALBUMIN 4.5 02/15/2020 1018   AST 28 05/01/2022 1009   ALT 20 05/01/2022 1009   ALKPHOS 103 02/15/2020 1018   BILITOT 0.5 05/01/2022 1009   BILITOT 0.4 02/15/2020 1018   GFRNONAA 74 02/15/2020 1018   GFRAA 85 02/15/2020 1018     Diabetic Labs (most recent): Lab Results  Component Value Date   HGBA1C 10.0 (A) 06/24/2022   HGBA1C 9.6 (A) 12/27/2021   HGBA1C 9.0 09/24/2021   MICROALBUR 80 02/09/2020     Lab Results  Component Value Date   TSH 1.56 05/01/2022   TSH 1.85 07/23/2019   FREET4 1.2 05/01/2022      Assessment & Plan:   1) Uncontrolled type 2 diabetes mellitus with hyperglycemia (HCC)  - Uzma Wermuth has currently uncontrolled symptomatic type 2 DM since  62 years of age.  She presents today with her meter, no logs, showing above target fasting glycemic profile.  Her POCT A1c today is 10%, increasing from last visit of 9.6%.  She had previously reduced her Metformin back to 500  mg po twice daily due to GI side effects.  She has also considered my offer of GLP1 but she is concerned both with cost and possible side effects.  Analysis of her meter shows 7-day average of 194, 14-day average of 197, 30-day average of 201, 90-day average of 198.  - I had a long discussion with her about the progressive nature of diabetes and the pathology behind its complications. -her diabetes is complicated by obesity/sedentary  life, active smoking and she remains at a high risk for more acute and chronic complications which include CAD, CVA, CKD, retinopathy, and neuropathy. These are all discussed in detail with her.  The following Lifestyle Medicine recommendations according to [facility]) were discussed and offered to patient and she agrees to start the journey:  A. Whole Foods, Plant-based plate comprising of fruits and vegetables, plant-based proteins, whole-grain carbohydrates was discussed in detail with the patient.   A list for source of those nutrients were also provided to the patient.  Patient will use only water or unsweetened tea for hydration. B.  The need to stay away from risky substances including alcohol, smoking; obtaining 7 to 9 hours of restorative sleep, at least 150 minutes of moderate intensity exercise weekly, the importance of healthy social connections,  and stress reduction techniques were discussed. C.  A full color page of  Calorie density of various food groups per pound showing examples of each food groups was provided to the patient.  - Nutritional counseling repeated at each appointment due to patients tendency to fall back in to old habits.  - The patient admits there is a room for improvement in their diet and drink choices. -  Suggestion is made for the patient to avoid simple carbohydrates from their diet including Cakes, Sweet Desserts / Pastries, Ice Cream, Soda (diet and regular), Sweet Tea, Candies, Chips, Cookies, Sweet Pastries, Store Bought Juices, Alcohol in Excess of 1-2 drinks a day, Artificial Sweeteners, Coffee Creamer, and "Sugar-free" Products. This will help patient to have stable blood glucose profile and potentially avoid unintended weight gain.   - I encouraged the patient to switch to unprocessed or minimally processed complex starch and increased protein intake (animal or plant source), fruits, and vegetables.   - Patient is advised  to stick to a routine mealtimes to eat 3 meals a day and avoid unnecessary snacks (to snack only to correct hypoglycemia).  - she will be scheduled with Jearld Fenton, RDN, CDE for diabetes education.  - I have approached her with the following individualized plan to manage  her diabetes and patient agrees:   -Will switch her back to Metformin 1000 mg ER daily at bedtime (to help with GI SE).   I also discussed the possibility of trying GLP1 vs basal insulin if we continue to struggle getting glucose managed. She would like to work harder on WFPB lifestyle first.   She is encouraged to start monitoring glucose once daily, before breakfast to help her regain control of her diabetes.   - Specific targets for  A1c;  LDL, HDL,  and Triglycerides were discussed with the patient.  2) Blood Pressure /Hypertension:   Her blood pressure is still not controlled to target.  She is advised to continue Lisinopril 20 mg po daily and HCTZ 12.5 mg twice a week and follow up with her PCP for concerns.  She had not yet taken her morning medications.  3) Lipids/Hyperlipidemia:  Her recent lipid panel from 09/24/21  shows uncontrolled LDL of 127 and elevated triglycerides of 161.  She is not currently on any lipid lowering agents. medications.  Will recheck lipid panel prior to next visit.  4) Weight/Diet:  Her Body mass index is 41.31 kg/m.  -   clearly complicating her diabetes care.   she is  a candidate for weight loss. I discussed with her the fact that loss of 5 - 10% of her  current body weight will have the most impact on her diabetes management.  Exercise, and detailed carbohydrates information provided  -  detailed on discharge instructions.  5) Chronic Care/Health Maintenance: -she is on ACEI medications and is encouraged to initiate and continue to follow up with Ophthalmology, Dentist,  Podiatrist at least yearly or according to recommendations, and advised to  Quit smoking. I have recommended yearly  flu vaccine and pneumonia vaccine at least every 5 years; moderate intensity exercise for up to 150 minutes weekly; and  sleep for at least 7 hours a day.  - she is advised to maintain close follow up with Carrolyn Meiers, MD for primary care needs, as well as her other providers for optimal and coordinated care.      I spent  46  minutes in the care of the patient today including review of labs from [place], Lipids, Thyroid Function, Hematology (current and previous including abstractions from other facilities); face-to-face time discussing  her blood glucose readings/logs, discussing hypoglycemia and hyperglycemia episodes and symptoms, medications doses, her options of short and long term treatment based on the latest standards of care / guidelines;  discussion about incorporating lifestyle medicine;  and documenting the encounter. Risk reduction counseling performed per USPSTF guidelines to reduce obesity and cardiovascular risk factors.     Please refer to Patient Instructions for Blood Glucose Monitoring and Insulin/Medications Dosing Guide"  in media tab for additional information. Please  also refer to " Patient Self Inventory" in the Media  tab for reviewed elements of pertinent patient history.  Wendy Kramer participated in the discussions, expressed understanding, and voiced agreement with the above plans.  All questions were answered to her satisfaction. she is encouraged to contact clinic should she have any questions or concerns prior to her return visit.   Follow up plan: - Return in about 3 months (around 09/24/2022) for Diabetes F/U with A1c in office, Previsit labs, Bring meter and logs.  Rayetta Pigg, [facility] [address] [place], [place] [address] Phone: [phone] Fax: [phone]  06/24/2022, 9:55 AM

## 2022-07-02 ENCOUNTER — Encounter: Payer: Self-pay | Admitting: *Deleted

## 2022-07-02 NOTE — Telephone Encounter (Signed)
[facility]..mailed letter 

## 2022-07-11 ENCOUNTER — Ambulatory Visit: Payer: Self-pay | Admitting: *Deleted

## 2022-07-11 NOTE — Patient Outreach (Signed)
  Care Coordination   07/11/2022 Name: [name] MRN: [id number] DOB: 1960-12-01   Care Coordination Outreach Attempts:  An unsuccessful telephone outreach was attempted today to offer the patient information about available care coordination services as a benefit of their health plan.   Follow Up Plan:  Additional outreach attempts will be made to offer the patient care coordination information and services.  New care manager, Demetrios Loll, RN to follow.  Encounter Outcome:  No Answer   Care Coordination Interventions:  No, not indicated    SIG Jhostin Epps C. Burgess Estelle, MSN, [facility] Gerontological Nurse Practitioner [facility] Care Management [phone]

## 2022-07-19 ENCOUNTER — Telehealth: Payer: Self-pay | Admitting: *Deleted

## 2022-07-19 NOTE — Progress Notes (Unsigned)
  Health Equity Plan Care Coordination  Note  07/19/2022 Name: Wendy Kramer MRN: [id number] DOB: 1960/07/12  Wendy Kramer is a 62 y.o. year old female who is a primary care patient of [name], Wayland Salinas, MD and is actively engaged with the care management team. I reached out to [facility] by phone today to assist with re-scheduling a follow up visit with the RN Case Manager  Follow up plan: Unsuccessful telephone outreach attempt made. A HIPAA compliant phone message was left for the patient providing contact information and requesting a return call.   [facility]  Care Coordination Care Guide  Direct Dial: [phone]

## 2022-07-25 DIAGNOSIS — I10 Essential (primary) hypertension: Secondary | ICD-10-CM | POA: Diagnosis not present

## 2022-07-25 DIAGNOSIS — J449 Chronic obstructive pulmonary disease, unspecified: Secondary | ICD-10-CM | POA: Diagnosis not present

## 2022-07-25 NOTE — Progress Notes (Signed)
  Care Coordination Note  07/25/2022 Name: Wendy Kramer MRN: [id number] DOB: 1960/09/05  Wendy Kramer is a 62 y.o. year old female who is a primary care patient of [name], Wayland Salinas, MD and is actively engaged with the care management team. I reached out to [facility] by phone today to assist with re-scheduling a follow up visit with the RN Case Manager  Follow up plan: Telephone appointment with care management team member scheduled for:08/02/22  [facility] Coordination Care Guide  Direct Dial: [phone]

## 2022-07-29 DIAGNOSIS — E1165 Type 2 diabetes mellitus with hyperglycemia: Secondary | ICD-10-CM | POA: Diagnosis not present

## 2022-07-29 DIAGNOSIS — G894 Chronic pain syndrome: Secondary | ICD-10-CM | POA: Diagnosis not present

## 2022-07-29 DIAGNOSIS — M545 Low back pain, unspecified: Secondary | ICD-10-CM | POA: Diagnosis not present

## 2022-07-29 DIAGNOSIS — J449 Chronic obstructive pulmonary disease, unspecified: Secondary | ICD-10-CM | POA: Diagnosis not present

## 2022-07-31 ENCOUNTER — Telehealth: Payer: Self-pay | Admitting: *Deleted

## 2022-07-31 NOTE — Telephone Encounter (Signed)
Pt called and would like to know if Tizanidine 2mg  will affect her liver and kidneys. She was recently prescribed this medication, but has not started it yet.

## 2022-08-01 NOTE — Telephone Encounter (Signed)
Spoke to pt, informed her of recommendations. Pt voiced understanding.  

## 2022-08-02 ENCOUNTER — Ambulatory Visit: Payer: Self-pay | Admitting: *Deleted

## 2022-08-02 NOTE — Patient Outreach (Signed)
  Care Coordination   08/02/2022 Name: Shalee Rostami MRN: [id number] DOB: 05-16-1960   Care Coordination Outreach Attempts:  An unsuccessful telephone outreach was attempted for a scheduled appointment today.  Follow Up Plan:  Additional outreach attempts will be made to offer the patient care coordination information and services.   Encounter Outcome:  No Answer   Care Coordination Interventions:  Yes, provided   Interventions Today    Flowsheet Row Most Recent Value  Chronic Disease   Chronic disease during today's visit Diabetes  General Interventions   General Interventions Discussed/Reviewed Doctor Visits, Labs  Labs Hgb A1c every 3 months, Kidney Function  Doctor Visits Discussed/Reviewed Specialist, Doctor Visits Reviewed  Education Interventions   Education Provided Provided Printed Education, Provided Education  Provided Verbal Education On Nutrition, Blood Sugar Monitoring, Exercise, When to see the doctor, Labs  Labs Reviewed Hgb A1c  [A1C increased from 9.6 to 10 at last endocrinology visit]  Pharmacy Interventions   Pharmacy Dicussed/Reviewed Pharmacy Topics Reviewed       Demetrios Loll, BSN, RN-BC RN Care Coordinator [facility]  [facility] Direct Dial: [phone] Main #: [phone]

## 2022-08-28 DIAGNOSIS — J449 Chronic obstructive pulmonary disease, unspecified: Secondary | ICD-10-CM | POA: Diagnosis not present

## 2022-08-28 DIAGNOSIS — I10 Essential (primary) hypertension: Secondary | ICD-10-CM | POA: Diagnosis not present

## 2022-09-04 ENCOUNTER — Encounter: Payer: Self-pay | Admitting: Internal Medicine

## 2022-09-12 DIAGNOSIS — Z6841 Body Mass Index (BMI) 40.0 and over, adult: Secondary | ICD-10-CM | POA: Diagnosis not present

## 2022-09-12 DIAGNOSIS — H6503 Acute serous otitis media, bilateral: Secondary | ICD-10-CM | POA: Diagnosis not present

## 2022-09-16 ENCOUNTER — Ambulatory Visit: Payer: Self-pay | Admitting: *Deleted

## 2022-09-17 ENCOUNTER — Encounter: Payer: Self-pay | Admitting: *Deleted

## 2022-09-17 NOTE — Patient Outreach (Signed)
Care Coordination   Health Equity Plan Follow Up Visit Note   09/16/2022 Name: Wendy Kramer MRN: [id number] DOB: 10/30/1960  Wendy Kramer is a 62 y.o. year old female who sees [name], Wayland Salinas, MD for primary care. I spoke with  [name] by phone today.  What matters to the patients health and wellness today?  Managing blood sugar    Goals Addressed               This Visit's Progress     Patient Stated     COMPLETED: I would like to reduce my HgbA1C by 1.0% over the next 3 months. Current value is 9.6 on 12/28/22. (pt-stated)        . Interventions Today    Flowsheet Row Most Recent Value  Chronic Disease   Chronic disease during today's visit Diabetes  General Interventions   General Interventions Discussed/Reviewed General Interventions Discussed, General Interventions Reviewed, Annual Eye Exam, Labs, Annual Foot Exam, Lipid Profile, Durable Medical Equipment (DME), Community Resources, Doctor Visits  Labs Hgb A1c every 3 months  Doctor Visits Discussed/Reviewed Doctor Visits Discussed, Doctor Visits Reviewed, Annual Wellness Visits, PCP, Specialist  Durable Medical Equipment (DME) Glucomoter  [Pt would benefit from getting a CGM.]  PCP/Specialist Visits Compliance with follow-up visit  Exercise Interventions   Exercise Discussed/Reviewed Exercise Discussed, Exercise Reviewed, Physical Activity, Weight Managment  Physical Activity Discussed/Reviewed Physical Activity Discussed  Weight Management Weight loss  Education Interventions   Education Provided Provided Printed Education, Provided Education  Provided Verbal Education On Nutrition, Foot Care, Eye Care, Labs, Blood Sugar Monitoring, Exercise, Medication  Labs Reviewed Hgb A1c, Kidney Function, Lipid Profile  Mental Health Interventions   Mental Health Discussed/Reviewed Mental Health Discussed  Nutrition Interventions   Nutrition Discussed/Reviewed Nutrition Discussed, Carbohydrate meal planning,  Portion sizes  Pharmacy Interventions   Pharmacy Dicussed/Reviewed Medication Adherence  Safety Interventions   Safety Discussed/Reviewed Safety Discussed  Advanced Directive Interventions   Advanced Directives Discussed/Reviewed Advanced Directives Discussed           Other     Over the next 6 months patient will decrease A1C to < 7        Care Coordination Goals: Patient will follow-up with endocrinologist every 3 months or as recommended Endocrinology f/u scheduled for 09/24/22 Patient will take medication as prescribed and reach out to provider with any negative side effects Patient had stopped Metformin ER 1000mg  b/c of side effects Restarted Metformin 500mg  BID 6 days ago Patient will continue to monitor and record blood sugar daily and as needed with glucometer, and will call PCP or endocrinologist with any readings outside of recommended range Patient will take blood sugar log and meter to provider visits for review Patient will check feet daily for sores, wounds, calluses, etc and will notify provider of any abnormal findings Patient will have yearly eye exams to check for or monitor diabetic retinopathy Patient will reach out to RN Care Coordinator [phone] with any care coordination or resource needs        SDOH assessments and interventions completed:  Yes  SDOH Interventions Today    Flowsheet Row Most Recent Value  SDOH Interventions   Transportation Interventions Intervention Not Indicated  Physical Activity Interventions Other (Comments), Patient Declined  [Per patient unable to increase activity level due to arthritis]        Care Coordination Interventions:  Yes, provided  Interventions Today    Flowsheet Row Most Recent Value  Chronic Disease   Chronic  disease during today's visit Diabetes, Hypertension (HTN)  General Interventions   General Interventions Discussed/Reviewed General Interventions Discussed, Labs, Doctor Visits, General  Interventions Reviewed, Durable Medical Equipment (DME)  [Home blood sugar reading of 138 this morning. No readings <70 and only one reading >200 since restarting metformin 6 days ago. Highest fasting CBG while off of meds was 330. Blood pressure was 124/68]  Labs Hgb A1c every 3 months  Doctor Visits Discussed/Reviewed Doctor Visits Discussed, Doctor Visits Reviewed, PCP, Specialist  [endocrinology visit scheduled for 09/24/22]  PCP/Specialist Visits Compliance with follow-up visit  Exercise Interventions   Exercise Discussed/Reviewed Exercise Reviewed, Exercise Discussed, Physical Activity  Physical Activity Discussed/Reviewed Physical Activity Discussed, Physical Activity Reviewed  [unable to increase due to arthritis pain]  Education Interventions   Education Provided Provided Education  Provided Verbal Education On Nutrition, When to see the doctor, Blood Sugar Monitoring, Medication, Exercise, Labs, Other  [blood pressure monitoring]  Labs Reviewed Hgb A1c  [A1C 10.0 on 06/24/22]  Nutrition Interventions   Nutrition Discussed/Reviewed Nutrition Discussed, Nutrition Reviewed, Carbohydrate meal planning, Portion sizes, Fluid intake  [Patient has been monitoring and adjusting her diet recently and has seen improvement in blood sugar and has lost 5 lbs]  Pharmacy Interventions   Pharmacy Dicussed/Reviewed Pharmacy Topics Discussed, Pharmacy Topics Reviewed, Medications and their functions, Medication Adherence  [has been on prednisone for ear infection. Discussed that blood sugar may increase due to that. Updated med list]  Medication Adherence --  [stopped Metformin XR 1000mg  b/c of side effects. Restarted old Rx of Metformin 500mg  BID about a week ago and is tolerating that well]  Safety Interventions   Safety Discussed/Reviewed Safety Discussed, Safety Reviewed       Follow up plan: Follow up call scheduled for 10/16/22    Encounter Outcome:  Pt. Visit Completed   Demetrios Loll, BSN,  RN-BC RN Care Coordinator [facility]  [facility] Direct Dial: [phone] Main #: [phone]

## 2022-09-24 ENCOUNTER — Ambulatory Visit: Payer: Medicare Other | Admitting: Nurse Practitioner

## 2022-09-24 DIAGNOSIS — E1165 Type 2 diabetes mellitus with hyperglycemia: Secondary | ICD-10-CM

## 2022-09-24 DIAGNOSIS — E782 Mixed hyperlipidemia: Secondary | ICD-10-CM

## 2022-09-24 DIAGNOSIS — E559 Vitamin D deficiency, unspecified: Secondary | ICD-10-CM

## 2022-09-24 DIAGNOSIS — Z7984 Long term (current) use of oral hypoglycemic drugs: Secondary | ICD-10-CM

## 2022-09-28 DIAGNOSIS — I10 Essential (primary) hypertension: Secondary | ICD-10-CM | POA: Diagnosis not present

## 2022-09-28 DIAGNOSIS — J449 Chronic obstructive pulmonary disease, unspecified: Secondary | ICD-10-CM | POA: Diagnosis not present

## 2022-10-16 ENCOUNTER — Encounter: Payer: Self-pay | Admitting: *Deleted

## 2022-10-21 ENCOUNTER — Ambulatory Visit: Payer: Self-pay | Admitting: *Deleted

## 2022-10-21 NOTE — Patient Outreach (Signed)
   Health Equity Plan Care Coordination   10/21/2022 Name: Wendy Kramer MRN: [id number] DOB: Apr 07, 1960   Care Coordination Outreach Attempts:  An unsuccessful telephone outreach was attempted for a scheduled appointment today.  Follow Up Plan:  Additional outreach attempts will be made to offer the patient care coordination information and services.   Encounter Outcome:  No Answer. Left HIPAA compliant VM.   Care Coordination Interventions:  No, not indicated    Demetrios Loll, BSN, RN-BC RN Care Coordinator [facility]  [facility] Direct Dial: [phone] Main #: [phone]

## 2022-10-23 DIAGNOSIS — Z1389 Encounter for screening for other disorder: Secondary | ICD-10-CM | POA: Diagnosis not present

## 2022-10-23 DIAGNOSIS — E1165 Type 2 diabetes mellitus with hyperglycemia: Secondary | ICD-10-CM | POA: Diagnosis not present

## 2022-10-23 DIAGNOSIS — E559 Vitamin D deficiency, unspecified: Secondary | ICD-10-CM | POA: Diagnosis not present

## 2022-10-23 DIAGNOSIS — I10 Essential (primary) hypertension: Secondary | ICD-10-CM | POA: Diagnosis not present

## 2022-10-23 DIAGNOSIS — N393 Stress incontinence (female) (male): Secondary | ICD-10-CM | POA: Diagnosis not present

## 2022-10-23 DIAGNOSIS — D2261 Melanocytic nevi of right upper limb, including shoulder: Secondary | ICD-10-CM | POA: Diagnosis not present

## 2022-10-23 DIAGNOSIS — F17210 Nicotine dependence, cigarettes, uncomplicated: Secondary | ICD-10-CM | POA: Diagnosis not present

## 2022-10-23 DIAGNOSIS — Z1331 Encounter for screening for depression: Secondary | ICD-10-CM | POA: Diagnosis not present

## 2022-10-23 DIAGNOSIS — G894 Chronic pain syndrome: Secondary | ICD-10-CM | POA: Diagnosis not present

## 2022-10-23 DIAGNOSIS — I700 Atherosclerosis of aorta: Secondary | ICD-10-CM | POA: Diagnosis not present

## 2022-10-23 DIAGNOSIS — F17200 Nicotine dependence, unspecified, uncomplicated: Secondary | ICD-10-CM | POA: Diagnosis not present

## 2022-10-29 ENCOUNTER — Other Ambulatory Visit: Payer: Self-pay

## 2022-10-29 MED ORDER — ACCU-CHEK SOFTCLIX LANCETS MISC: 5 refills | Status: DC

## 2022-11-01 ENCOUNTER — Other Ambulatory Visit (HOSPITAL_COMMUNITY): Payer: Self-pay

## 2022-11-04 ENCOUNTER — Other Ambulatory Visit: Payer: Self-pay | Admitting: *Deleted

## 2022-11-04 ENCOUNTER — Encounter: Payer: Self-pay | Admitting: *Deleted

## 2022-11-04 ENCOUNTER — Ambulatory Visit: Payer: Self-pay | Admitting: *Deleted

## 2022-11-04 DIAGNOSIS — E1165 Type 2 diabetes mellitus with hyperglycemia: Secondary | ICD-10-CM

## 2022-11-04 MED ORDER — ACCU-CHEK SOFTCLIX LANCETS MISC
5 refills | Status: AC
Start: 2022-11-04 — End: ?

## 2022-11-04 MED ORDER — ACCU-CHEK GUIDE VI STRP
ORAL_STRIP | 1 refills | Status: DC
Start: 2022-11-04 — End: 2022-12-09

## 2022-11-04 NOTE — Patient Outreach (Signed)
Care Coordination   Health Equity Plan Follow Up Visit Note   11/04/2022 Name: Wendy Kramer MRN: [id number] DOB: 05/27/1960  Wendy Kramer is a 62 y.o. year old female who sees Wendy Kramer, Wendy Salinas, MD for primary care. I spoke with  Wendy Kramer by phone today.  What matters to the patients health and wellness today?  Lowering A1C     Goals Addressed             This Visit's Progress    Over the next 6 months patient will decrease A1C to < 7       Care Coordination Goals: Patient will follow-up with endocrinologist every 3 months or as recommended Endocrinology f/u scheduled for 12/03/22 Patient will take medication as prescribed and reach out to provider with any negative side effects Patient will continue to monitor and record blood sugar daily and as needed with glucometer, and will call endocrinologist with any readings outside of recommended range Patient will take blood sugar log and meter to provider visits for review Patient will follow a carb modified diet with 3 meals per day that include 30GM of carbohydrates, lean proteins, and healthy fats and less than 2 snacks per day with less than 15GM of carbohydrates  Patient will increase physical activity as tolerated (limited by joint pain) with an ultimate goal of at least 150 minutes of moderate activity per week Patient will continue to monitor and record blood pressure twice per day, since HTN is a comorbidity of DM, and reach out to PCP with any readings outside of recommended range Patient will reach out to RN Care Coordinator [phone] with any care coordination or resource needs        SDOH assessments and interventions completed:  Yes  SDOH Interventions Today    Flowsheet Row Most Recent Value  SDOH Interventions   Housing Interventions Intervention Not Indicated  Transportation Interventions Intervention Not Indicated        Care Coordination Interventions:  Yes, provided  Interventions Today     Flowsheet Row Most Recent Value  Chronic Disease   Chronic disease during today's visit Diabetes, Hypertension (HTN)  General Interventions   General Interventions Discussed/Reviewed General Interventions Discussed, General Interventions Reviewed, Labs, Durable Medical Equipment (DME), Doctor Visits  Labs Hgb A1c every 3 months  Doctor Visits Discussed/Reviewed Doctor Visits Discussed, Doctor Visits Reviewed, PCP, Annual Wellness Visits, Specialist  [PCP visit last week. Ordering labs for tomorrow and dopplers of both legs to check blood flow. Complains of bilateral leg weakness and right knee pain.]  Durable Medical Equipment (DME) BP Cuff, Glucomoter  [Checking blood pressure twice a day. This morning 117/68. Checking blood sugar twice per day. Has seen improvement. No readings below 70 or abovce 200. 117 this morning.]  PCP/Specialist Visits Compliance with follow-up visit  [Endocrinologist on 12/03/22. Have lab work this week for PCP. F/u as recommended]  Exercise Interventions   Exercise Discussed/Reviewed Exercise Discussed, Exercise Reviewed, Physical Activity, Weight Managment  Physical Activity Discussed/Reviewed Physical Activity Discussed, Physical Activity Reviewed, Types of exercise, Home Exercise Program (HEP)  [walks daily]  Weight Management Weight loss  [Encouraged optimal diet and exercise for overall health and continued weight loss]  Education Interventions   Education Provided Provided Printed Education, Provided Education  [Printed education on nutrition and diabetes and exercise and diabetes]  Provided Verbal Education On Nutrition, Labs, Blood Sugar Monitoring, When to see the doctor, Exercise, Medication  Labs Reviewed Hgb A1c  [A1C 06/24/22 10.0. Having lab work, including  A1C, tomorrow]  Nutrition Interventions   Nutrition Discussed/Reviewed Nutrition Discussed, Nutrition Reviewed, Adding fruits and vegetables, Fluid intake, Carbohydrate meal planning, Portion sizes,  Decreasing sugar intake, Increasing proteins  [Watches carb and sugar intake. Drinks plenty of water & avoids sugary drinks. Has noticed that eating earlier in the evening allows for lower fasting blood sugars the next morning. Limit of 30GM of carbohydrates with meals and 15GM with snacks]  Pharmacy Interventions   Pharmacy Dicussed/Reviewed Pharmacy Topics Discussed, Medications and their functions, Pharmacy Topics Reviewed  [Taking Metformin 500mg  once a day. 1000mg  and extended release caused stomach upset. Encouraged to talk to endocirnologist about other medciations to aid in blood sugar control. Takes Tylenol arthritis and tramadol for pain. Cautioned against NSAID use.]  Safety Interventions   Safety Discussed/Reviewed Safety Discussed, Safety Reviewed, Fall Risk, Home Safety  [One fall last year. Fall risk due to right knee pain from arthritis and gait disturbance. Uses a cane outside of the home and uses things to hold onto inside of the home.]  Home Safety Assistive Devices       Follow up plan: Follow up call scheduled for 12/06/22    Encounter Outcome:  Pt. Visit Completed   Wendy Kramer, BSN, RN-BC RN Care Coordinator [facility]  [facility] Direct Dial: [phone] Main #: [phone]

## 2022-11-06 ENCOUNTER — Other Ambulatory Visit: Payer: Self-pay

## 2022-11-06 ENCOUNTER — Telehealth: Payer: Self-pay | Admitting: *Deleted

## 2022-11-06 DIAGNOSIS — E1165 Type 2 diabetes mellitus with hyperglycemia: Secondary | ICD-10-CM

## 2022-11-06 MED ORDER — ACCU-CHEK SOFTCLIX LANCETS MISC
5 refills | Status: AC
Start: 2022-11-06 — End: ?

## 2022-11-06 NOTE — Telephone Encounter (Signed)
Patient has left a few messages about her lancets. She states that they need a PA. We have not seen anything in our office. Sending to the PA team to see if they had seen this, if so what is the status?

## 2022-11-07 ENCOUNTER — Other Ambulatory Visit (HOSPITAL_COMMUNITY): Payer: Self-pay

## 2022-11-07 NOTE — Telephone Encounter (Signed)
Patient was called and made aware. She has picked up her lancets.

## 2022-11-23 DIAGNOSIS — J449 Chronic obstructive pulmonary disease, unspecified: Secondary | ICD-10-CM | POA: Diagnosis not present

## 2022-11-23 DIAGNOSIS — I10 Essential (primary) hypertension: Secondary | ICD-10-CM | POA: Diagnosis not present

## 2022-11-25 ENCOUNTER — Ambulatory Visit: Payer: Medicare Other | Admitting: Nurse Practitioner

## 2022-12-03 ENCOUNTER — Ambulatory Visit: Payer: Medicare Other | Admitting: Nurse Practitioner

## 2022-12-03 DIAGNOSIS — Z7984 Long term (current) use of oral hypoglycemic drugs: Secondary | ICD-10-CM

## 2022-12-03 DIAGNOSIS — E782 Mixed hyperlipidemia: Secondary | ICD-10-CM

## 2022-12-03 DIAGNOSIS — E559 Vitamin D deficiency, unspecified: Secondary | ICD-10-CM

## 2022-12-03 DIAGNOSIS — E119 Type 2 diabetes mellitus without complications: Secondary | ICD-10-CM

## 2022-12-06 ENCOUNTER — Ambulatory Visit: Payer: Self-pay | Admitting: *Deleted

## 2022-12-06 NOTE — Patient Outreach (Signed)
   Health Equity Plan Care Coordination   12/06/2022 Name: Wendy Kramer MRN: [id number] DOB: 1961/01/18   Care Coordination Outreach Attempts:  An unsuccessful telephone outreach was attempted for a scheduled appointment today.  Follow Up Plan:  Additional outreach attempts will be made to offer the patient care coordination information and services.   Encounter Outcome:  No Answer. Left HIPAA compliant VM.   Care Coordination Interventions:  No, not indicated    Demetrios Loll, RN, BSN Care Management Coordinator [facility]  [facility] Direct Dial: [phone] Main #: [phone]

## 2022-12-09 ENCOUNTER — Telehealth: Payer: Self-pay | Admitting: *Deleted

## 2022-12-09 ENCOUNTER — Other Ambulatory Visit: Payer: Self-pay | Admitting: Nurse Practitioner

## 2022-12-09 DIAGNOSIS — E1165 Type 2 diabetes mellitus with hyperglycemia: Secondary | ICD-10-CM

## 2022-12-09 NOTE — Progress Notes (Unsigned)
  Health Equity Plan Note  12/09/2022 Name: [name] MRN: [id number] DOB: January 18, 1961  Wendy Kramer is a 62 y.o. year old female who is a primary care patient of [name], Wayland Salinas, MD and is actively engaged with the care management team. I reached out to [facility] by phone today to assist with re-scheduling a follow up visit with the RN Case Manager  Follow up plan: Unsuccessful telephone outreach attempt made. A HIPAA compliant phone message was left for the patient providing contact information and requesting a return call.   [facility]  Care Coordination Care Guide  Direct Dial: [phone]

## 2022-12-12 NOTE — Progress Notes (Signed)
  Health Equity Plan Note  12/12/2022 Name: Wendy Kramer MRN: [id number] DOB: 1960-11-19  Wendy Kramer is a 62 y.o. year old female who is a primary care patient of [name], Wayland Salinas, MD and is actively engaged with the care management team. I reached out to [facility] by phone today to assist with re-scheduling a follow up visit with the RN Case Manager  Follow up plan: Telephone appointment with care management team member scheduled for:01/09/23  [facility] Coordination Care Guide  Direct Dial: [phone]

## 2022-12-23 ENCOUNTER — Encounter: Payer: Self-pay | Admitting: *Deleted

## 2022-12-23 ENCOUNTER — Ambulatory Visit (INDEPENDENT_AMBULATORY_CARE_PROVIDER_SITE_OTHER): Payer: Medicare Other | Admitting: Gastroenterology

## 2022-12-23 ENCOUNTER — Encounter: Payer: Self-pay | Admitting: Gastroenterology

## 2022-12-23 ENCOUNTER — Telehealth: Payer: Self-pay | Admitting: *Deleted

## 2022-12-23 VITALS — BP 137/71 | HR 99 | Temp 97.9°F | Ht 63.0 in | Wt 233.4 lb

## 2022-12-23 DIAGNOSIS — Z86010 Personal history of colonic polyps: Secondary | ICD-10-CM

## 2022-12-23 DIAGNOSIS — K7469 Other cirrhosis of liver: Secondary | ICD-10-CM | POA: Diagnosis not present

## 2022-12-23 DIAGNOSIS — K7460 Unspecified cirrhosis of liver: Secondary | ICD-10-CM

## 2022-12-23 DIAGNOSIS — K219 Gastro-esophageal reflux disease without esophagitis: Secondary | ICD-10-CM | POA: Diagnosis not present

## 2022-12-23 DIAGNOSIS — D509 Iron deficiency anemia, unspecified: Secondary | ICD-10-CM

## 2022-12-23 MED ORDER — RABEPRAZOLE SODIUM 20 MG PO TBEC: 20.0000 mg | DELAYED_RELEASE_TABLET | Freq: Two times a day (BID) | ORAL | 3 refills | Status: DC

## 2022-12-23 MED ORDER — NA SULFATE-K SULFATE-MG SULF 17.5-3.13-1.6 GM/177ML PO SOLN: ORAL | 0 refills | Status: DC

## 2022-12-23 NOTE — Telephone Encounter (Signed)
Spoke with pt.  1) aware of [place] appt details 2) aware PCP will need to do referral for chronic pain management 3) TCS/EGD scheduled for 11/13 with Dr. Jena Gauss, ASA 3. She requested same prep as last time. Advised will send in suprep. Will mail instructions/pre-op appt.

## 2022-12-23 NOTE — Progress Notes (Signed)
GI Office Note    Referring Provider: Benetta Spar* Primary Care Physician:  Benetta Spar, MD Primary Gastroenterologist: [name].[name], MD  Date:  12/23/2022  ID:  Wendy Kramer, DOB Dec 05, 1960, MRN [id number]   Chief Complaint   Chief Complaint  Patient presents with   Follow-up    Follow up. No problems    History of Present Illness  Wendy Kramer is a 62 y.o. female with a history of diabetes, arthritis, GERD, HTN, PFO, stroke, kidney stones, colon polyps, and NASH cirrhosis presenting today for follow-up.  EGD 03/05/2018: -Erosive reflux esophagitis -Small hiatal hernia -Erosive gastropathy s/p biopsy -Stomach biopsy with reactive gastropathy, negative H. Pylori   Colonoscopy 03/05/2018: -Sigmoid and descending diverticulosis -2 polyps in the rectum and descending colon -13 mm polyp in hepatic flexure -Pathology with hyperplastic and serrated polyps -Repeat TCS in 3 years.   RUQ [place] 04/26/2022: -Cirrhosis morphology of liver -No focal liver lesion   Last office visit 04/30/22. Denied any signs of HEENT, abdominal swelling/distention, increased peripheral edema.  Having about 2 bowel movements per day.  Admitted to not hearing to 2 g sodium diet.  Recently had considered stopping some of her glucose medication as she felt as though this was causing nausea, fatigue, and diarrhea.  Having occasional shortness of breath with associated fatigue.  Was due to see chronic pain upcoming regarding her arthritis.  Reported a couple episodes of nausea with vomiting which could have been contributed to reflux but thought it was due to her pain.  Had increased her rabeprazole to twice daily and was having some improvement of symptoms.  Prescription for rabeprazole increase to twice daily.  Dietary factors reinforced.  Advised right upper quadrant ultrasound and labs.  Scheduled for EGD and colonoscopy.  Advised to follow-up with primary care for management of blood  pressure.    Labs 06/24/22: A1c 10  Today: Cirrhosis history Hematemesis/coffee ground emesis: noen History of variceal bleeding: None Abdominal pain: None Abdominal distention/worsening ascites: normal LLE swelling. Sometimes feels like she is bloated but gets better at times and not constant. Has some intermittent ballooning from prior hernia with coughing. (Has been on hydrochlorothiazide on/off but stopped due to dehydration) Fever/chills: none Episodes of confusion/disorientation: none Number of daily bowel movements: sometimes 2, sometimes more - chronic loose stools Taking diuretics?: none Date of last EGD: 2019 - no varices Prior history of banding?: none Prior episodes of SBP: none Last time liver imaging was performed:04/26/22 -cirrhosis, no liver lesion  Working on getting more strict on her diet. On metformin 500 mg BID.   Hepatitis A and B vaccination status: not yet done.   MELD Na: 6 (January 2024)  GERD well controlled on Aciphex. No dysphagia.   Tried doing the slow iron - only tolerated it for about 3 days before she was having severe nausea (started eating spinach and liver but still feels off). Has admittedly not been great with doing what she needs to do. She had briefly stopped her blood sugar medication due to her feelings but started back a few months ago). Having issue with her PCP (states the PA is not sending referrals and not listening to her). She states she quit seeing chronic pain due to them going to [place] but wants to go see them now). 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   Had COVID a few months ago and lots of family loss.  Went 2 months without motrin but  then started using it here and there to help with her neck pain. Also using tylenol arthritis and still takes her tramadol.   Patient reported given some family medical issues she did not schedule her upper endoscopy and colonoscopy.   Current Outpatient  Medications  Medication Sig Dispense Refill   Accu-Chek Softclix Lancets lancets Use as instructed to monitor glucose 1 times daily. 100 each 5   albuterol (PROVENTIL HFA;VENTOLIN HFA) 108 (90 Base) MCG/ACT inhaler Inhale 1 puff into the lungs every 6 (six) hours as needed for wheezing or shortness of breath.     Blood Glucose Monitoring Suppl (ACCU-CHEK GUIDE ME) w/Device KIT 1 Piece by Does not apply route as directed. 1 kit 0   BREO ELLIPTA 200-25 MCG/ACT AEPB Inhale 1 puff into the lungs daily.     cyclobenzaprine (FLEXERIL) 5 MG tablet Take 5 mg by mouth 3 (three) times daily as needed for muscle spasms.  0   fluticasone (FLONASE) 50 MCG/ACT nasal spray Place 1 spray into both nostrils daily as needed for allergies or rhinitis.      glucose blood (ACCU-CHEK GUIDE) test strip USE TO CHECK BLOOD SUGARS ONCE DAILY 100 strip 1   ibuprofen (ADVIL) 600 MG tablet Take 600 mg by mouth every 8 (eight) hours as needed (pain.).     lisinopril (ZESTRIL) 20 MG tablet Take 20 mg by mouth 2 (two) times daily.     metFORMIN (GLUCOPHAGE) 500 MG tablet Take 500 mg by mouth 2 (two) times daily with a meal.     Multiple Vitamins-Minerals (ONE DAILY MULTIVIT/IRON-FREE) TABS Take 1 tablet by mouth every evening.     RABEprazole (ACIPHEX) 20 MG tablet Take 1 tablet (20 mg total) by mouth 2 (two) times daily before a meal. 90 tablet 3   sodium chloride (OCEAN) 0.65 % SOLN nasal spray Place 1 spray into both nostrils as needed for congestion.     traMADol (ULTRAM) 50 MG tablet Take 50-100 mg by mouth every 8 (eight) hours as needed for moderate pain.  3   triamcinolone (KENALOG) 0.025 % cream Apply 1 Application topically as needed (rash).     ALPRAZolam (XANAX) 0.25 MG tablet Take 0.125-0.25 mg by mouth daily as needed for anxiety. (Patient not taking: Reported on 12/23/2022)     budesonide-formoterol (SYMBICORT) 80-4.5 MCG/ACT inhaler Inhale 2 puffs into the lungs 2 (two) times daily. (Patient not taking: Reported on  12/23/2022)     HYDROcodone-acetaminophen (NORCO/VICODIN) 5-325 MG tablet Take 1 tablet by mouth daily as needed for moderate pain. (Patient not taking: Reported on 12/23/2022)     ipratropium-albuterol (DUONEB) 0.5-2.5 (3) MG/3ML SOLN Take 3 mLs by nebulization every 6 (six) hours as needed (shortness of breath). (Patient not taking: Reported on 06/04/2022)     metFORMIN (GLUCOPHAGE-XR) 500 MG 24 hr tablet Take 2 tablets (1,000 mg total) by mouth at bedtime. (Patient not taking: Reported on 09/17/2022) 180 tablet 3   No current facility-administered medications for this visit.    Past Medical History:  Diagnosis Date   Arthritis    Cardiac murmur    Cervical spine disease    Cirrhosis (HCC)    likely secondary to NASH   Diabetes mellitus without complication (HCC)    diet controlled   Exogenous hyperlipidemia    GERD (gastroesophageal reflux disease)    History of kidney stones    HTN (hypertension)    Morbid obesity due to excess calories (HCC)    Other chronic pain  Panic disorder    Patent foramen ovale    TEE 2018   Prediabetes    Sleep apnea    Stroke (HCC)    "mini-stroke" no deficits from this.   Unspecified asthma with (acute) exacerbation    Vitamin D deficiency     Past Surgical History:  Procedure Laterality Date   BIOPSY  03/05/2018   Procedure: BIOPSY;  Surgeon: Corbin Ade, MD;  Location: [facility];  Service: Endoscopy;;  gastric   CHOLECYSTECTOMY     COLONOSCOPY WITH PROPOFOL N/A 03/05/2018   Dr. Jena Gauss: Diverticulosis in the sigmoid and descending colon.  3 colon polyps removed, 2 hyperplastic and one sessile serrated polyp.  Next colonoscopy 3 years.   ESOPHAGOGASTRODUODENOSCOPY (EGD) WITH PROPOFOL N/A 03/05/2018   Dr. Jena Gauss: Erosive reflux esophagitis, small hiatal hernia, erosive gastropathy with reactive gastropathy on biopsy.  No H. pylori.   HERNIA REPAIR     umbilical   POLYPECTOMY  03/05/2018   Procedure: POLYPECTOMY;  Surgeon: Corbin Ade,  MD;  Location: [facility];  Service: Endoscopy;;  colon   SHOULDER ARTHROSCOPY Right    TEE WITHOUT CARDIOVERSION  2018   patent foramen ovale   TONSILLECTOMY AND ADENOIDECTOMY     TUBAL LIGATION      Family History  Problem Relation Age of Onset   Breast cancer Maternal Aunt    Colon cancer Other        maternal great grandfather   Liver disease Brother        Fatty liver   HIV Brother    Hypertension Mother    Hyperlipidemia Mother    Hypertension Father    Thyroid disease Sister    Pancreatic cancer Sister    Healthy Daughter    Healthy Son    Healthy Son    Heart disease Son     Allergies as of 12/23/2022 - Review Complete 12/23/2022  Allergen Reaction Noted   Asa [aspirin] Anaphylaxis 09/24/2016   Augmentin [amoxicillin-pot clavulanate] Other (See Comments) 09/24/2016   Levaquin [levofloxacin in d5w] Itching and Other (See Comments) 09/24/2016   Lovenox [enoxaparin sodium] Other (See Comments) 09/24/2016    Social History   Socioeconomic History   Marital status: Legally Separated    Spouse name: Not on file   Number of children: Not on file   Years of education: Not on file   Highest education level: Not on file  Occupational History   Not on file  Tobacco Use   Smoking status: Every Day    Current packs/day: 0.50    Average packs/day: 0.5 packs/day for 40.0 years (20.0 ttl pk-yrs)    Types: Cigarettes   Smokeless tobacco: Never  Vaping Use   Vaping status: Never Used  Substance and Sexual Activity   Alcohol use: No   Drug use: No   Sexual activity: Not Currently    Birth control/protection: Post-menopausal, Surgical    Comment: tubal  Other Topics Concern   Not on file  Social History Narrative   Not on file   Social Determinants of Health   Financial Resource Strain: Medium Risk (06/04/2022)   Overall Financial Resource Strain (CARDIA)    Difficulty of Paying Living Expenses: Somewhat hard  Food Insecurity: No Food Insecurity (06/04/2022)    Hunger Vital Sign    Worried About Running Out of Food in the Last Year: Never true    Ran Out of Food in the Last Year: Never true  Transportation Needs: No Transportation Needs (  11/04/2022)   PRAPARE - Administrator, Civil Service (Medical): No    Lack of Transportation (Non-Medical): No  Physical Activity: Insufficiently Active (09/17/2022)   Exercise Vital Sign    Days of Exercise per Week: 3 days    Minutes of Exercise per Session: 30 min  Stress: No Stress Concern Present (06/04/2022)   [employer] of Occupational Health - Occupational Stress Questionnaire    Feeling of Stress : Only a little  Social Connections: Socially Isolated (06/04/2022)   Social Connection and Isolation Panel [NHANES]    Frequency of Communication with Friends and Family: More than three times a week    Frequency of Social Gatherings with Friends and Family: More than three times a week    Attends Religious Services: Never    Database administrator or Organizations: No    Attends Banker Meetings: Never    Marital Status: Separated   Review of Systems   Gen: Denies fever, chills, anorexia. Denies fatigue, weakness, weight loss.  CV: Denies chest pain, palpitations, syncope, peripheral edema, and claudication. Resp: Denies dyspnea at rest, cough, wheezing, coughing up blood, and pleurisy. GI: See HPI Derm: Denies rash, itching, dry skin Psych: Denies depression, anxiety, memory loss, confusion. No homicidal or suicidal ideation.  Heme: Denies bruising, bleeding, and enlarged lymph nodes.  Physical Exam   BP 137/71 (BP Location: Left Arm, Patient Position: Sitting, Cuff Size: Normal)   Pulse 99   Temp 97.9 F (36.6 C) (Temporal)   Ht 5\' 3"  (1.6 m)   Wt 233 lb 6.4 oz (105.9 kg)   LMP 09/06/2016   SpO2 98%   BMI 41.34 kg/m   General:   Alert and oriented. No distress noted. Pleasant and cooperative.  Head:  Normocephalic and atraumatic. Eyes:  Conjuctiva clear without  scleral icterus. Mouth:  Oral mucosa pink and moist. Good dentition. No lesions. Lungs:  Clear to auscultation bilaterally. No wheezes, rales, or rhonchi. No distress.  Heart:  S1, S2 present without murmurs appreciated.  Abdomen:  +BS, soft, non-tender and non-distended. No rebound or guarding. No HSM or masses noted. Rectal: deferred Msk:  Symmetrical without gross deformities. Normal posture. Extremities: Nonpitting edema to left lower extremity.  Varicose veins present to left lower extremity, spider veins present to right lower extremity. Neurologic:  Alert and  oriented x4 Psych:  Alert and cooperative. Normal mood and affect.  Assessment  Wendy Kramer is a 62 y.o. female with a history of diabetes, arthritis, GERD, HTN, PFO, stroke, kidney stones, colon polyps, and NASH cirrhosis presenting today for follow-up.  NASH Cirrhosis: Remains fairly well compensated.  MELD sodium score remains at 6 based off recent labs in January.  Currently overdue for labs and hepatoma screening.  Last ultrasound in January without focal liver lesion.  Continues to need hepatitis A and B vaccination, wrote patient a prescription today.  Last in 2019 without varices, currently overdue for screening.  Place new orders to perform upper endoscopy to screen for esophageal varices in the near future.  Currently she is not on any diuretics and does not have any signs of any volume overload.  Denies any pruritus or mental status changes.  No hepatic cephalopathy noted today.  Nutrition again reinforced today  IDA: Hemoglobin last assessed in January, 10.1.  As previously noted her baseline was considered to be in the 14-15 range.  Continues to experience some fatigue but also has been experiencing some swelling in her left lower extremity  related to varicose veins.  Still having some occasional shortness of breath with activity however nothing worsening to her baseline and usually occurs with activity.  States she has  not been able to tolerate oral iron due to nausea.  Will plan to recheck CBC and iron panel today given it has been 6 months.  Discussed potential for hematology referral for periodic iron infusions.  Due to update EGD and colonoscopy for further evaluation.  GERD: Currently well-controlled on Aciphex 20 mg twice daily.  Denies any nausea, vomiting, or dysphagia.  History of colon polyps: Last colonoscopy in 2019 with sessile serrated polyps and hyperplastic polyps.  Was recommended to have repeat in 3 years, currently well overdue.  Will attempt again at rescheduling colonoscopy at time of EGD.  New orders placed again today  PLAN   RUQ [place] CBC, iron panel, CMP, INR, AFP May need to consider hematology referral if ongoing iron deficiency given inability to tolerate p.o. iron 2g sodium diet Rabeprazole 20 mg BID Proceed with upper endoscopy and colonoscopy with propofol by Dr. Jena Gauss in near future: the risks, benefits, and alternatives have been discussed with the patient in detail. The patient states understanding and desires to proceed. ASA 3 Hold metformin night prior to and morning of  Referral to chronic pain (Dr. Hetty Ely) - [facility] Cirrhosis education provided. Given repeat prescriptions for hepatitis A and B vaccines Follow up in 6 months  Brooke Bonito, MSN, FNP-BC, AGACNP-BC [facility]

## 2022-12-23 NOTE — Patient Instructions (Addendum)
Please have blood work completed at [facility].  We will call you with results once they have been received. Please allow 3-5 business days for review once completed.  If your iron/hemoglobin remain low we may consider referral to hematology for periodic iron infusions.  We will contact you to get you scheduled for your right upper quadrant ultrasound as well as your upper endoscopy and colonoscopy within the near future with Dr. Jena Gauss.  You will receive separate detailed written instructions regarding your prep.  If you have a voicemail please ensure that it is not full that way if we call we can leave a message to have you call back.  We will also likely send a MyChart message if you can ensure that you have logging capabilities for this.  Cirrhosis Lifestyle Recommendations:  High-protein diet from a primarily plant-based diet. Avoid red meat.  No raw or undercooked meat, seafood, or shellfish. Low-fat/cholesterol/carbohydrate diet. Limit sodium to no more than 2000 mg/day including everything that you eat and drink. Recommend at least 30 minutes of aerobic and resistance exercise 3 days/week. Limit Tylenol to 2000 mg daily.   I have refilled your rabeprazole for you today.  Continue to follow GERD diet.  I will have my staff also send a referral to chronic pain for you.  We will plan to follow-up in 6 months, sooner if needed.  It was a pleasure to see you today. I want to create trusting relationships with patients. If you receive a survey regarding your visit,  I greatly appreciate you taking time to fill this out on paper or through your MyChart. I value your feedback.  Brooke Bonito, MSN, FNP-BC, AGACNP-BC [facility]

## 2022-12-24 DIAGNOSIS — J449 Chronic obstructive pulmonary disease, unspecified: Secondary | ICD-10-CM | POA: Diagnosis not present

## 2022-12-24 DIAGNOSIS — I10 Essential (primary) hypertension: Secondary | ICD-10-CM | POA: Diagnosis not present

## 2022-12-25 ENCOUNTER — Other Ambulatory Visit (HOSPITAL_COMMUNITY): Payer: Self-pay | Admitting: Internal Medicine

## 2022-12-25 DIAGNOSIS — I700 Atherosclerosis of aorta: Secondary | ICD-10-CM | POA: Diagnosis not present

## 2022-12-25 DIAGNOSIS — E1165 Type 2 diabetes mellitus with hyperglycemia: Secondary | ICD-10-CM | POA: Diagnosis not present

## 2022-12-25 DIAGNOSIS — J449 Chronic obstructive pulmonary disease, unspecified: Secondary | ICD-10-CM | POA: Diagnosis not present

## 2022-12-25 DIAGNOSIS — Z1231 Encounter for screening mammogram for malignant neoplasm of breast: Secondary | ICD-10-CM

## 2022-12-25 DIAGNOSIS — I10 Essential (primary) hypertension: Secondary | ICD-10-CM | POA: Diagnosis not present

## 2022-12-25 DIAGNOSIS — D509 Iron deficiency anemia, unspecified: Secondary | ICD-10-CM | POA: Diagnosis not present

## 2022-12-25 DIAGNOSIS — E559 Vitamin D deficiency, unspecified: Secondary | ICD-10-CM | POA: Diagnosis not present

## 2022-12-25 DIAGNOSIS — Z0001 Encounter for general adult medical examination with abnormal findings: Secondary | ICD-10-CM | POA: Diagnosis not present

## 2022-12-25 DIAGNOSIS — K7460 Unspecified cirrhosis of liver: Secondary | ICD-10-CM | POA: Diagnosis not present

## 2022-12-25 DIAGNOSIS — G894 Chronic pain syndrome: Secondary | ICD-10-CM | POA: Diagnosis not present

## 2022-12-26 ENCOUNTER — Inpatient Hospital Stay (HOSPITAL_COMMUNITY)
Admission: EM | Admit: 2022-12-26 | Discharge: 2022-12-27 | DRG: 378 | Disposition: A | Discharge status: Home / Self Care | Payer: Medicare Other | Attending: Internal Medicine | Admitting: Internal Medicine

## 2022-12-26 ENCOUNTER — Other Ambulatory Visit: Payer: Self-pay | Admitting: *Deleted

## 2022-12-26 ENCOUNTER — Other Ambulatory Visit: Payer: Self-pay

## 2022-12-26 ENCOUNTER — Encounter (HOSPITAL_COMMUNITY): Payer: Self-pay | Admitting: Emergency Medicine

## 2022-12-26 DIAGNOSIS — K3189 Other diseases of stomach and duodenum: Secondary | ICD-10-CM | POA: Diagnosis not present

## 2022-12-26 DIAGNOSIS — Z8673 Personal history of transient ischemic attack (TIA), and cerebral infarction without residual deficits: Secondary | ICD-10-CM

## 2022-12-26 DIAGNOSIS — E782 Mixed hyperlipidemia: Secondary | ICD-10-CM | POA: Diagnosis present

## 2022-12-26 DIAGNOSIS — Z635 Disruption of family by separation and divorce: Secondary | ICD-10-CM

## 2022-12-26 DIAGNOSIS — D179 Benign lipomatous neoplasm, unspecified: Secondary | ICD-10-CM | POA: Diagnosis not present

## 2022-12-26 DIAGNOSIS — I10 Essential (primary) hypertension: Secondary | ICD-10-CM | POA: Diagnosis present

## 2022-12-26 DIAGNOSIS — G8929 Other chronic pain: Secondary | ICD-10-CM | POA: Diagnosis present

## 2022-12-26 DIAGNOSIS — K219 Gastro-esophageal reflux disease without esophagitis: Secondary | ICD-10-CM | POA: Diagnosis present

## 2022-12-26 DIAGNOSIS — Y838 Other surgical procedures as the cause of abnormal reaction of the patient, or of later complication, without mention of misadventure at the time of the procedure: Secondary | ICD-10-CM | POA: Diagnosis not present

## 2022-12-26 DIAGNOSIS — Z79899 Other long term (current) drug therapy: Secondary | ICD-10-CM

## 2022-12-26 DIAGNOSIS — Z7951 Long term (current) use of inhaled steroids: Secondary | ICD-10-CM

## 2022-12-26 DIAGNOSIS — Z8349 Family history of other endocrine, nutritional and metabolic diseases: Secondary | ICD-10-CM

## 2022-12-26 DIAGNOSIS — Z7984 Long term (current) use of oral hypoglycemic drugs: Secondary | ICD-10-CM | POA: Diagnosis not present

## 2022-12-26 DIAGNOSIS — K31811 Angiodysplasia of stomach and duodenum with bleeding: Principal | ICD-10-CM | POA: Diagnosis present

## 2022-12-26 DIAGNOSIS — Z8249 Family history of ischemic heart disease and other diseases of the circulatory system: Secondary | ICD-10-CM

## 2022-12-26 DIAGNOSIS — D649 Anemia, unspecified: Secondary | ICD-10-CM | POA: Diagnosis not present

## 2022-12-26 DIAGNOSIS — D509 Iron deficiency anemia, unspecified: Secondary | ICD-10-CM

## 2022-12-26 DIAGNOSIS — D175 Benign lipomatous neoplasm of intra-abdominal organs: Secondary | ICD-10-CM | POA: Diagnosis present

## 2022-12-26 DIAGNOSIS — K644 Residual hemorrhoidal skin tags: Secondary | ICD-10-CM | POA: Diagnosis present

## 2022-12-26 DIAGNOSIS — Z886 Allergy status to analgesic agent status: Secondary | ICD-10-CM

## 2022-12-26 DIAGNOSIS — K31819 Angiodysplasia of stomach and duodenum without bleeding: Secondary | ICD-10-CM

## 2022-12-26 DIAGNOSIS — Z888 Allergy status to other drugs, medicaments and biological substances status: Secondary | ICD-10-CM

## 2022-12-26 DIAGNOSIS — F410 Panic disorder [episodic paroxysmal anxiety] without agoraphobia: Secondary | ICD-10-CM | POA: Diagnosis present

## 2022-12-26 DIAGNOSIS — K766 Portal hypertension: Secondary | ICD-10-CM | POA: Diagnosis not present

## 2022-12-26 DIAGNOSIS — Z6841 Body Mass Index (BMI) 40.0 and over, adult: Secondary | ICD-10-CM | POA: Diagnosis not present

## 2022-12-26 DIAGNOSIS — K5731 Diverticulosis of large intestine without perforation or abscess with bleeding: Secondary | ICD-10-CM | POA: Diagnosis present

## 2022-12-26 DIAGNOSIS — D62 Acute posthemorrhagic anemia: Secondary | ICD-10-CM | POA: Diagnosis present

## 2022-12-26 DIAGNOSIS — K648 Other hemorrhoids: Secondary | ICD-10-CM | POA: Diagnosis present

## 2022-12-26 DIAGNOSIS — K7460 Unspecified cirrhosis of liver: Secondary | ICD-10-CM | POA: Diagnosis not present

## 2022-12-26 DIAGNOSIS — K2100 Gastro-esophageal reflux disease with esophagitis, without bleeding: Secondary | ICD-10-CM | POA: Diagnosis present

## 2022-12-26 DIAGNOSIS — K649 Unspecified hemorrhoids: Secondary | ICD-10-CM | POA: Diagnosis not present

## 2022-12-26 DIAGNOSIS — K7581 Nonalcoholic steatohepatitis (NASH): Secondary | ICD-10-CM | POA: Diagnosis present

## 2022-12-26 DIAGNOSIS — K449 Diaphragmatic hernia without obstruction or gangrene: Secondary | ICD-10-CM | POA: Diagnosis present

## 2022-12-26 DIAGNOSIS — J45909 Unspecified asthma, uncomplicated: Secondary | ICD-10-CM | POA: Diagnosis present

## 2022-12-26 DIAGNOSIS — E1165 Type 2 diabetes mellitus with hyperglycemia: Secondary | ICD-10-CM | POA: Diagnosis present

## 2022-12-26 DIAGNOSIS — E119 Type 2 diabetes mellitus without complications: Secondary | ICD-10-CM

## 2022-12-26 DIAGNOSIS — T8089XA Other complications following infusion, transfusion and therapeutic injection, initial encounter: Secondary | ICD-10-CM | POA: Diagnosis not present

## 2022-12-26 DIAGNOSIS — K5730 Diverticulosis of large intestine without perforation or abscess without bleeding: Secondary | ICD-10-CM | POA: Diagnosis not present

## 2022-12-26 DIAGNOSIS — F17210 Nicotine dependence, cigarettes, uncomplicated: Secondary | ICD-10-CM | POA: Diagnosis present

## 2022-12-26 DIAGNOSIS — Z83438 Family history of other disorder of lipoprotein metabolism and other lipidemia: Secondary | ICD-10-CM

## 2022-12-26 DIAGNOSIS — Z86010 Personal history of colonic polyps: Secondary | ICD-10-CM

## 2022-12-26 DIAGNOSIS — Z8719 Personal history of other diseases of the digestive system: Secondary | ICD-10-CM

## 2022-12-26 LAB — COMPREHENSIVE METABOLIC PANEL
AG Ratio: 1.5 (calc) (ref 1.0–2.5)
ALT: 14 U/L (ref 6–29)
AST: 26 U/L (ref 10–35)
Albumin: 3.8 g/dL (ref 3.6–5.1)
Alkaline phosphatase (APISO): 85 U/L (ref 37–153)
BUN: 14 mg/dL (ref 7–25)
CO2: 25 mmol/L (ref 20–32)
Calcium: 9 mg/dL (ref 8.6–10.4)
Chloride: 104 mmol/L (ref 98–110)
Creat: 0.67 mg/dL (ref 0.50–1.05)
Globulin: 2.5 g/dL (calc) (ref 1.9–3.7)
Glucose, Bld: 187 mg/dL — ABNORMAL HIGH (ref 65–99)
Potassium: 4.2 mmol/L (ref 3.5–5.3)
Sodium: 139 mmol/L (ref 135–146)
Total Bilirubin: 0.3 mg/dL (ref 0.2–1.2)
Total Protein: 6.3 g/dL (ref 6.1–8.1)

## 2022-12-26 LAB — GLUCOSE, CAPILLARY
Glucose-Capillary: 131 mg/dL — ABNORMAL HIGH (ref 70–99)
Glucose-Capillary: 136 mg/dL — ABNORMAL HIGH (ref 70–99)
Glucose-Capillary: 148 mg/dL — ABNORMAL HIGH (ref 70–99)

## 2022-12-26 LAB — BPAM RBC
Blood Product Expiration Date: 202410082359
Blood Product Expiration Date: 202410112359
ISSUE DATE / TIME: 202409261302
Unit Type and Rh: 5100
Unit Type and Rh: 5100

## 2022-12-26 LAB — RETICULOCYTES
Immature Retic Fract: 25.8 % — ABNORMAL HIGH (ref 2.3–15.9)
RBC.: 3.63 MIL/uL — ABNORMAL LOW (ref 3.87–5.11)
Retic Count, Absolute: 56.6 10*3/uL (ref 19.0–186.0)
Retic Ct Pct: 1.6 % (ref 0.4–3.1)

## 2022-12-26 LAB — CBC
HCT: 24.8 % — ABNORMAL LOW (ref 35.0–45.0)
HCT: 24.9 % — ABNORMAL LOW (ref 36.0–46.0)
Hemoglobin: 6.4 g/dL — ABNORMAL LOW (ref 11.7–15.5)
Hemoglobin: 6.5 g/dL — CL (ref 12.0–15.0)
MCH: 17.6 pg — ABNORMAL LOW (ref 27.0–33.0)
MCH: 17.9 pg — ABNORMAL LOW (ref 26.0–34.0)
MCHC: 25.8 g/dL — ABNORMAL LOW (ref 32.0–36.0)
MCHC: 26.1 g/dL — ABNORMAL LOW (ref 30.0–36.0)
MCV: 68.3 fL — ABNORMAL LOW (ref 80.0–100.0)
MCV: 68.6 fL — ABNORMAL LOW (ref 80.0–100.0)
Platelets: 200 10*3/uL (ref 140–400)
Platelets: 194 10*3/uL (ref 150–400)
RBC: 3.63 10*6/uL — ABNORMAL LOW (ref 3.80–5.10)
RBC: 3.63 MIL/uL — ABNORMAL LOW (ref 3.87–5.11)
RDW: 17.4 % — ABNORMAL HIGH (ref 11.0–15.0)
RDW: 19.2 % — ABNORMAL HIGH (ref 11.5–15.5)
WBC: 7 10*3/uL (ref 3.8–10.8)
WBC: 6.7 10*3/uL (ref 4.0–10.5)
nRBC: 0 % (ref 0.0–0.2)

## 2022-12-26 LAB — PROTIME-INR
INR: 1
Prothrombin Time: 10.8 s (ref 9.0–11.5)

## 2022-12-26 LAB — TYPE AND SCREEN
Unit division: 0
Unit division: 0

## 2022-12-26 LAB — POC OCCULT BLOOD, ED: Occult Blood, Stool #1: POSITIVE

## 2022-12-26 LAB — IRON AND TIBC
Iron: 12 ug/dL — ABNORMAL LOW (ref 28–170)
Saturation Ratios: 2 % — ABNORMAL LOW (ref 10.4–31.8)
TIBC: 530 ug/dL — ABNORMAL HIGH (ref 250–450)
UIBC: 518 ug/dL

## 2022-12-26 LAB — TRANSFUSION REACTION
DAT C3: NEGATIVE
Post RXN DAT IgG: NEGATIVE

## 2022-12-26 LAB — IRON,TIBC AND FERRITIN PANEL
%SAT: 3 % (calc) — ABNORMAL LOW (ref 16–45)
Ferritin: 3 ng/mL — ABNORMAL LOW (ref 16–288)
Iron: 12 ug/dL — ABNORMAL LOW (ref 45–160)
TIBC: 479 mcg/dL (calc) — ABNORMAL HIGH (ref 250–450)

## 2022-12-26 LAB — ABO/RH: ABO/RH(D): O POS

## 2022-12-26 LAB — HEMOGLOBIN A1C
Hgb A1c MFr Bld: 9.4 % — ABNORMAL HIGH (ref 4.8–5.6)
Mean Plasma Glucose: 223.08 mg/dL

## 2022-12-26 LAB — HEMOGLOBIN AND HEMATOCRIT, BLOOD
HCT: 28.1 % — ABNORMAL LOW (ref 36.0–46.0)
Hemoglobin: 7.7 g/dL — ABNORMAL LOW (ref 12.0–15.0)

## 2022-12-26 LAB — VITAMIN B12: Vitamin B-12: 737 pg/mL (ref 180–914)

## 2022-12-26 LAB — MAGNESIUM: Magnesium: 1.6 mg/dL (ref 1.5–2.5)

## 2022-12-26 LAB — FERRITIN: Ferritin: 3 ng/mL — ABNORMAL LOW (ref 11–307)

## 2022-12-26 LAB — PREPARE RBC (CROSSMATCH)

## 2022-12-26 LAB — FOLATE: Folate: 24.3 ng/mL (ref >5.9)

## 2022-12-26 MED ORDER — TRAMADOL HCL 50 MG PO TABS
50.0000 mg | ORAL_TABLET | Freq: Three times a day (TID) | ORAL | Status: DC | PRN
  Administered 2022-12-27: 50 mg via ORAL
  Filled 2022-12-26: qty 1

## 2022-12-26 MED ORDER — BISACODYL 5 MG PO TBEC
10.0000 mg | DELAYED_RELEASE_TABLET | Freq: Once | ORAL | Status: AC
  Administered 2022-12-26: 10 mg via ORAL
  Filled 2022-12-26: qty 2

## 2022-12-26 MED ORDER — CYCLOBENZAPRINE HCL 10 MG PO TABS: 5.0000 mg | ORAL_TABLET | Freq: Three times a day (TID) | ORAL | Status: DC | PRN

## 2022-12-26 MED ORDER — PANTOPRAZOLE SODIUM 40 MG IV SOLR
80.0000 mg | Freq: Once | INTRAVENOUS | Status: AC
  Administered 2022-12-26: 80 mg via INTRAVENOUS
  Filled 2022-12-26: qty 20

## 2022-12-26 MED ORDER — INSULIN ASPART 100 UNIT/ML IJ SOLN
0.0000 [IU] | INTRAMUSCULAR | Status: DC
  Administered 2022-12-26 – 2022-12-27 (×5): 1 [IU] via SUBCUTANEOUS
  Administered 2022-12-27: 2 [IU] via SUBCUTANEOUS

## 2022-12-26 MED ORDER — PEG 3350-KCL-NA BICARB-NACL 420 G PO SOLR
4000.0000 mL | Freq: Once | ORAL | Status: AC
  Administered 2022-12-26: 4000 mL via ORAL

## 2022-12-26 MED ORDER — DIPHENHYDRAMINE HCL 50 MG/ML IJ SOLN
25.0000 mg | Freq: Once | INTRAMUSCULAR | Status: AC
  Administered 2022-12-26: 25 mg via INTRAVENOUS
  Filled 2022-12-26: qty 1

## 2022-12-26 MED ORDER — SODIUM CHLORIDE 0.9% IV SOLUTION: Freq: Once | INTRAVENOUS | Status: AC

## 2022-12-26 MED ORDER — FLUTICASONE FUROATE-VILANTEROL 200-25 MCG/ACT IN AEPB
1.0000 | INHALATION_SPRAY | Freq: Every day | RESPIRATORY_TRACT | Status: DC
  Administered 2022-12-27: 1 via RESPIRATORY_TRACT
  Filled 2022-12-26: qty 28

## 2022-12-26 MED ORDER — ONDANSETRON HCL 4 MG/2ML IJ SOLN: 4.0000 mg | Freq: Four times a day (QID) | INTRAMUSCULAR | Status: DC | PRN

## 2022-12-26 MED ORDER — ACETAMINOPHEN 325 MG PO TABS: 650.0000 mg | ORAL_TABLET | Freq: Four times a day (QID) | ORAL | Status: DC | PRN

## 2022-12-26 MED ORDER — FLUTICASONE PROPIONATE 50 MCG/ACT NA SUSP: 1.0000 | Freq: Every day | NASAL | Status: DC | PRN

## 2022-12-26 MED ORDER — PANTOPRAZOLE SODIUM 40 MG IV SOLR
40.0000 mg | Freq: Two times a day (BID) | INTRAVENOUS | Status: DC
  Administered 2022-12-26 – 2022-12-27 (×2): 40 mg via INTRAVENOUS
  Filled 2022-12-26 (×2): qty 10

## 2022-12-26 MED ORDER — ONDANSETRON HCL 4 MG PO TABS: 4.0000 mg | ORAL_TABLET | Freq: Four times a day (QID) | ORAL | Status: DC | PRN

## 2022-12-26 MED ORDER — ACETAMINOPHEN 650 MG RE SUPP: 650.0000 mg | Freq: Four times a day (QID) | RECTAL | Status: DC | PRN

## 2022-12-26 NOTE — Consult Note (Signed)
Gastroenterology Consult   Referring Provider: No ref. provider found Primary Care Physician:  Benetta Spar, MD Primary Gastroenterologist:  Dr. Jena Gauss  Patient ID: Wendy Kramer; [id number]; 1960/05/22   Admit date: 12/26/2022  LOS: 0 days   Date of Consultation: 12/26/2022  Reason for Consultation:  symptomatic anemia  History of Present Illness   Wendy Kramer is a 62 y.o. year old female with a history of diabetes, arthritis, GERD, HTN, PFO, stroke, asthma, kidney stones, colon polyps, and NASH cirrhosis who presented to the ED at my recommendation given outpatient labs with hgb 6.4 and recommended blood transfusion.   Recently seen in the close earlier this week 12/23/2022 for regular 31-month cirrhosis follow-up.  Remained well compensated and was overdue for labs and hepatoma screening.  She denied any melena, BRBPR, episodes of confusion, increased peripheral edema or abdominal distention, hematemesis, lack of appetite.  Has some baseline looser stools.  Acid reflux controlled with Aciphex.  States she had not been taking iron as she was not able to tolerate it due to severe nausea despite taking with food.  Discussed her chronic pain as well.  Has reported ongoing fatigue but she had also noted COVID in the interim as well.  Today she does report that she has had some worsening shortness of breath over time but is also been having difficulty with obtaining an adequate inhaler to treat her asthma.  States that her physician has been unable to get Symbicort approved through her insurance.  Continues to deny any melena or BRBPR but has had ongoing fatigue and malaise, not worsening from what she reported earlier this week.  No medication changes since Monday.  She is okay with potentially staying for admission for consideration of inpatient procedures.   EGD 03/05/2018: -Erosive reflux esophagitis -Small hiatal hernia -Erosive gastropathy s/p biopsy -Stomach biopsy with  reactive gastropathy, negative H. Pylori   Colonoscopy 03/05/2018: -Sigmoid and descending diverticulosis -2 polyps in the rectum and descending colon -13 mm polyp in hepatic flexure -Pathology with hyperplastic and serrated polyps -Repeat TCS in 3 years.   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   Unspecified asthma with (acute) exacerbation    Vitamin D deficiency     Past Surgical History:  Procedure Laterality Date   BIOPSY  03/05/2018   Procedure: BIOPSY;  Surgeon: Corbin Ade, MD;  Location: [facility];  Service: Endoscopy;;  gastric   CHOLECYSTECTOMY     COLONOSCOPY WITH PROPOFOL N/A 03/05/2018   Dr. Jena Gauss: Diverticulosis in the sigmoid and descending colon.  3 colon polyps removed, 2 hyperplastic and one sessile serrated polyp.  Next colonoscopy 3 years.   ESOPHAGOGASTRODUODENOSCOPY (EGD) WITH PROPOFOL N/A 03/05/2018   Dr. Jena Gauss: Erosive reflux esophagitis, small hiatal hernia, erosive gastropathy with reactive gastropathy on biopsy.  No H. pylori.   HERNIA  REPAIR     umbilical   POLYPECTOMY  03/05/2018   Procedure: POLYPECTOMY;  Surgeon: Corbin Ade, MD;  Location: [facility];  Service: Endoscopy;;  colon   SHOULDER ARTHROSCOPY Right    TEE WITHOUT CARDIOVERSION  2018   patent foramen ovale   TONSILLECTOMY AND ADENOIDECTOMY     TUBAL LIGATION      Prior to Admission medications   Medication Sig Start Date End Date Taking? Authorizing Provider  Accu-Chek  Softclix Lancets lancets Use as instructed to monitor glucose 1 times daily. 11/06/22   Dani Gobble, NP  albuterol (PROVENTIL HFA;VENTOLIN HFA) 108 (90 Base) MCG/ACT inhaler Inhale 1 puff into the lungs every 6 (six) hours as needed for wheezing or shortness of breath.    [provider]  ALPRAZolam Prudy Feeler) 0.25 MG tablet Take 0.125-0.25 mg by mouth daily as needed for anxiety. Patient not taking: Reported on 12/23/2022 12/16/20   [provider]  Blood Glucose Monitoring Suppl (ACCU-CHEK GUIDE ME) w/Device KIT 1 Piece by Does not apply route as directed. 11/02/19   Roma Kayser, MD  BREO ELLIPTA 200-25 MCG/ACT AEPB Inhale 1 puff into the lungs daily. 12/04/22   [provider]  budesonide-formoterol (SYMBICORT) 80-4.5 MCG/ACT inhaler Inhale 2 puffs into the lungs 2 (two) times daily. Patient not taking: Reported on 12/23/2022    [provider]  cyclobenzaprine (FLEXERIL) 5 MG tablet Take 5 mg by mouth 3 (three) times daily as needed for muscle spasms. 08/18/17   [provider]  fluticasone (FLONASE) 50 MCG/ACT nasal spray Place 1 spray into both nostrils daily as needed for allergies or rhinitis.     [provider]  glucose blood (ACCU-CHEK GUIDE) test strip USE TO CHECK BLOOD SUGARS ONCE DAILY 12/09/22   Dani Gobble, NP  HYDROcodone-acetaminophen (NORCO/VICODIN) 5-325 MG tablet Take 1 tablet by mouth daily as needed for moderate pain. Patient not taking: Reported on 12/23/2022 09/28/19   [provider]  ibuprofen (ADVIL) 600 MG tablet Take 600 mg by mouth every 8 (eight) hours as needed (pain.). 12/10/21   [provider]  ipratropium-albuterol (DUONEB) 0.5-2.5 (3) MG/3ML SOLN Take 3 mLs by nebulization every 6 (six) hours as needed (shortness of breath). Patient not taking: Reported on 06/04/2022 01/05/21   [provider]  lisinopril (ZESTRIL) 20 MG tablet Take 20 mg by mouth 2 (two) times daily.     [provider]  metFORMIN (GLUCOPHAGE) 500 MG tablet Take 500 mg by mouth 2 (two) times daily with a meal.    [provider]  Multiple Vitamins-Minerals (ONE DAILY MULTIVIT/IRON-FREE) TABS Take 1 tablet by mouth every evening.    [provider]  Na Sulfate-K Sulfate-Mg Sulf 17.5-3.13-1.6 GM/177ML SOLN As directed 12/23/22   [name], [name], MD  RABEprazole (ACIPHEX) 20 MG tablet Take 1 tablet (20 mg total) by mouth 2 (two) times daily before a meal. 12/23/22   Lundyn Coste, Frederik Schmidt, NP  sodium chloride (OCEAN) 0.65 % SOLN nasal spray Place 1 spray into both nostrils as needed for congestion.    [provider]  traMADol (ULTRAM) 50 MG tablet Take 50-100 mg by mouth every 8 (eight) hours as needed for moderate pain. 09/20/16   [provider]  triamcinolone (KENALOG) 0.025 % cream Apply 1 Application topically as needed (rash). 11/16/19   [provider]    Current Facility-Administered Medications  Medication Dose Route Frequency Provider Last Rate Last Admin   0.9 %  sodium  chloride infusion (Manually program via Guardrails IV Fluids)   Intravenous Once Derwood Kaplan, MD       Current Outpatient Medications  Medication Sig Dispense Refill   Accu-Chek Softclix Lancets lancets Use as instructed to monitor glucose 1 times daily. 100 each 5   albuterol (PROVENTIL HFA;VENTOLIN HFA) 108 (90 Base) MCG/ACT inhaler Inhale 1 puff into the lungs every 6 (six) hours as needed for wheezing or shortness of breath.     ALPRAZolam (XANAX) 0.25 MG tablet Take 0.125-0.25 mg by mouth daily as needed for anxiety. (Patient not taking: Reported on 12/23/2022)     Blood Glucose Monitoring Suppl (ACCU-CHEK GUIDE ME) w/Device KIT 1 Piece by Does not apply route as directed. 1 kit 0   BREO ELLIPTA 200-25 MCG/ACT AEPB Inhale 1 puff into the lungs daily.     budesonide-formoterol (SYMBICORT) 80-4.5 MCG/ACT inhaler Inhale 2 puffs into the lungs 2 (two) times daily. (Patient  not taking: Reported on 12/23/2022)     cyclobenzaprine (FLEXERIL) 5 MG tablet Take 5 mg by mouth 3 (three) times daily as needed for muscle spasms.  0   fluticasone (FLONASE) 50 MCG/ACT nasal spray Place 1 spray into both nostrils daily as needed for allergies or rhinitis.      glucose blood (ACCU-CHEK GUIDE) test strip USE TO CHECK BLOOD SUGARS ONCE DAILY 100 strip 1   HYDROcodone-acetaminophen (NORCO/VICODIN) 5-325 MG tablet Take 1 tablet by mouth daily as needed for moderate pain. (Patient not taking: Reported on 12/23/2022)     ibuprofen (ADVIL) 600 MG tablet Take 600 mg by mouth every 8 (eight) hours as needed (pain.).     ipratropium-albuterol (DUONEB) 0.5-2.5 (3) MG/3ML SOLN Take 3 mLs by nebulization every 6 (six) hours as needed (shortness of breath). (Patient not taking: Reported on 06/04/2022)     lisinopril (ZESTRIL) 20 MG tablet Take 20 mg by mouth 2 (two) times daily.     metFORMIN (GLUCOPHAGE) 500 MG tablet Take 500 mg by mouth 2 (two) times daily with a meal.     Multiple Vitamins-Minerals (ONE DAILY MULTIVIT/IRON-FREE) TABS Take 1 tablet by mouth every evening.     Na Sulfate-K Sulfate-Mg Sulf 17.5-3.13-1.6 GM/177ML SOLN As directed 354 mL 0   RABEprazole (ACIPHEX) 20 MG tablet Take 1 tablet (20 mg total) by mouth 2 (two) times daily before a meal. 180 tablet 3   sodium chloride (OCEAN) 0.65 % SOLN nasal spray Place 1 spray into both nostrils as needed for congestion.     traMADol (ULTRAM) 50 MG tablet Take 50-100 mg by mouth every 8 (eight) hours as needed for moderate pain.  3   triamcinolone (KENALOG) 0.025 % cream Apply 1 Application topically as needed (rash).      Allergies as of 12/26/2022 - Review Complete 12/26/2022  Allergen Reaction Noted   Asa [aspirin] Anaphylaxis 09/24/2016   Augmentin [amoxicillin-pot clavulanate] Other (See Comments) 09/24/2016   Levaquin [levofloxacin in d5w] Itching and Other (See Comments) 09/24/2016   Lovenox [enoxaparin sodium] Other (See  Comments) 09/24/2016    Family History  Problem Relation Age of Onset   Breast cancer Maternal Aunt    Colon cancer Other        maternal great grandfather   Liver disease Brother        Fatty liver   HIV Brother    Hypertension Mother    Hyperlipidemia Mother    Hypertension Father    Thyroid disease Sister    Pancreatic cancer Sister    Healthy  Daughter    Healthy Son    Healthy Son    Heart disease Son     Social History   Socioeconomic History   Marital status: Legally Separated    Spouse name: Not on file   Number of children: Not on file   Years of education: Not on file   Highest education level: Not on file  Occupational History   Not on file  Tobacco Use   Smoking status: Every Day    Current packs/day: 0.50    Average packs/day: 0.5 packs/day for 40.0 years (20.0 ttl pk-yrs)    Types: Cigarettes   Smokeless tobacco: Never  Vaping Use   Vaping status: Never Used  Substance and Sexual Activity   Alcohol use: No   Drug use: No   Sexual activity: Not Currently    Birth control/protection: Post-menopausal, Surgical    Comment: tubal  Other Topics Concern   Not on file  Social History Narrative   Not on file   Social Determinants of Health   Financial Resource Strain: Medium Risk (06/04/2022)   Overall Financial Resource Strain (CARDIA)    Difficulty of Paying Living Expenses: Somewhat hard  Food Insecurity: No Food Insecurity (06/04/2022)   Hunger Vital Sign    Worried About Running Out of Food in the Last Year: Never true    Ran Out of Food in the Last Year: Never true  Transportation Needs: No Transportation Needs (11/04/2022)   PRAPARE - Administrator, Civil Service (Medical): No    Lack of Transportation (Non-Medical): No  Physical Activity: Insufficiently Active (09/17/2022)   Exercise Vital Sign    Days of Exercise per Week: 3 days    Minutes of Exercise per Session: 30 min  Stress: No Stress Concern Present (06/04/2022)   [employer] of Occupational [employer] - Occupational Stress Questionnaire    Feeling of Stress : Only a little  Social Connections: Socially Isolated (06/04/2022)   Social Connection and Isolation Panel [NHANES]    Frequency of Communication with Friends and Family: More than three times a week    Frequency of Social Gatherings with Friends and Family: More than three times a week    Attends Religious Services: Never    Database administrator or Organizations: No    Attends Banker Meetings: Never    Marital Status: Separated  Intimate Partner Violence: Not At Risk (06/04/2022)   Humiliation, Afraid, Rape, and Kick questionnaire    Fear of Current or Ex-Partner: No    Emotionally Abused: No    Physically Abused: No    Sexually Abused: No     Review of Systems   Gen: Denies any fever, chills, loss of appetite, change in weight or weight loss CV: Denies chest pain, heart palpitations, syncope, edema  Resp: Denies shortness of breath with rest, cough, wheezing, coughing up blood, and pleurisy. GI: Denies vomiting blood, jaundice, and fecal incontinence.   Denies dysphagia or odynophagia. GU : Denies urinary burning, blood in urine, urinary frequency, and urinary incontinence. MS: Denies joint pain, limitation of movement, swelling, cramps, and atrophy.  Derm: Denies rash, itching, dry skin, hives. Psych: Denies depression, anxiety, memory loss, hallucinations, and confusion. Heme: Denies bruising or bleeding Neuro:  Denies any headaches, dizziness, paresthesias, shaking  Physical Exam   Vital Signs in last 24 hours: Temp:  [98.7 F (37.1 C)] 98.7 F (37.1 C) (09/26 1102) Pulse Rate:  [90] 90 (09/26 1102) Resp:  [18] 18 (  09/26 1102) BP: (149)/(55) 149/55 (09/26 1102) SpO2:  [96 %] 96 % (09/26 1102) Weight:  [105.7 kg] 105.7 kg (09/26 1103)    General:   Alert,  Well-developed, well-nourished, pleasant and cooperative in NAD Head:  Normocephalic and atraumatic. Eyes:   Sclera clear, no icterus.   Conjunctiva pink. Ears:  Normal auditory acuity. Mouth:  No deformity or lesions, dentition normal. Neck:  Supple; no masses Lungs:  Clear throughout to auscultation.   No wheezes, crackles, or rhonchi. No acute distress. Heart:  Regular rate and rhythm; no murmurs, clicks, rubs,  or gallops. Abdomen:  Soft, nontender and nondistended. No masses, hepatosplenomegaly or hernias noted. Normal bowel sounds, without guarding, and without rebound.   Rectal: deferred   Msk:  Symmetrical without gross deformities. Normal posture. Extremities:  Nonpitting edema to left lower extremity.  Varicose veins present to left lower extremity, spider veins present to right lower extremity. . Neurologic:  Alert and  oriented x4. Skin:  Intact without significant lesions or rashes. Psych:  Alert and cooperative. Normal mood and affect.  Intake/Output from previous day: No intake/output data recorded. Intake/Output this shift: No intake/output data recorded.  Labs/Studies   Recent Labs Recent Labs    12/25/22 0759 12/26/22 1121  WBC 7.0 6.7  HGB 6.4* 6.5*  HCT 24.8* 24.9*  PLT 200 194   BMET Recent Labs    12/25/22 0759  NA 139  K 4.2  CL 104  CO2 25  GLUCOSE 187*  BUN 14  CREATININE 0.67  CALCIUM 9.0   LFT Recent Labs    12/25/22 0759  PROT 6.3  AST 26  ALT 14  BILITOT 0.3   PT/INR Recent Labs    12/25/22 0759  LABPROT 10.8  INR 1.0   Hepatitis Panel No results for input(s): "HEPBSAG", "HCVAB", "HEPAIGM", "HEPBIGM" in the last 72 hours. C-Diff No results for input(s): "CDIFFTOX" in the last 72 hours.  Radiology/Studies No results found.   Assessment   Wendy Kramer is a 62 y.o. year old female with a history of diabetes, arthritis, GERD, HTN, PFO, stroke, asthma, kidney stones, colon polyps, and NASH cirrhosis who presented to the ED at my recommendation given outpatient labs with hgb 6.4 and recommended blood transfusion. GI consulted  given request for admission given symptomatic anemia with cirrhosis.   Cirrhosis: Secondary to [facility]. Well compensated.  No evidence of hepatic encephalopathy, ascites, or portal hypertension.  Has upcoming ultrasound for hepatoma screening.  Recent labs indicate MELD Na: 6.  aFP within normal limits.  Once able to have diet she should resume 2 g sodium diet.  For now we will continue clear liquids in anticipation for potential procedures tomorrow.  Anemia: Hemoglobin 6.4 on outpatient labs today.  Her last hemoglobin was 10 in January.  Continues to experience worsening fatigue and over the last several weeks/months she has had some worsening shortness of breath as well.  Denies any melena or BRBPR at all.  No hematemesis or hematuria.  Has tried different forms of oral iron over-the-counter however has been unable to tolerate given nausea.  Is in need of updated EGD and colonoscopy which has been attempted to be scheduled multiple times outpatient with current plans for 11/11.  Given her hemoglobin has dropped 4 points in the last 8 months we will likely plan for inpatient procedures, will discuss this further with Dr. Tasia Catchings.  On PPI twice daily outpatient and will continue while inpatient. Will be receiving 2u PRBC.  Will plan to  complete inpatient given severe iron deficiency.   Plan / Recommendations   Trend H/H, transfuse for Hgb <7 Agree with current plan for transfusion Monitor for overt GI bleeding Clear liquid diet Will discuss potential for inpatient EGD/Colonoscopy with Dr. Tasia Catchings PPI BID Given no overt bleeding will hold on octreotide/rocephin for now  Update: Patient apparently developed potential transfusion reaction after half blood transfused (hives). Will need to repeat CBC and if not above 7 then will not prep tonight for procedures tomorrow. Will discuss this and plan going forward if blood needed and defer transfusion reaction management to hospitalist. Will go ahead and order prep  but do not give unless Hgb >7 in anticipation for procedures tomorrow.    12/26/2022, 1:10 PM  Brooke Bonito, MSN, FNP-BC, AGACNP-BC [facility]

## 2022-12-26 NOTE — H&P (View-Only) (Signed)
Gastroenterology Consult   Referring Provider: No ref. provider found Primary Care Physician:  Benetta Spar, MD Primary Gastroenterologist:  Dr. Jena Gauss  Patient ID: Wendy Kramer; [id number]; 1960/05/22   Admit date: 12/26/2022  LOS: 0 days   Date of Consultation: 12/26/2022  Reason for Consultation:  symptomatic anemia  History of Present Illness   Wendy Kramer is a 62 y.o. year old female with a history of diabetes, arthritis, GERD, HTN, PFO, stroke, asthma, kidney stones, colon polyps, and NASH cirrhosis who presented to the ED at my recommendation given outpatient labs with hgb 6.4 and recommended blood transfusion.   Recently seen in the close earlier this week 12/23/2022 for regular 31-month cirrhosis follow-up.  Remained well compensated and was overdue for labs and hepatoma screening.  She denied any melena, BRBPR, episodes of confusion, increased peripheral edema or abdominal distention, hematemesis, lack of appetite.  Has some baseline looser stools.  Acid reflux controlled with Aciphex.  States she had not been taking iron as she was not able to tolerate it due to severe nausea despite taking with food.  Discussed her chronic pain as well.  Has reported ongoing fatigue but she had also noted COVID in the interim as well.  Today she does report that she has had some worsening shortness of breath over time but is also been having difficulty with obtaining an adequate inhaler to treat her asthma.  States that her physician has been unable to get Symbicort approved through her insurance.  Continues to deny any melena or BRBPR but has had ongoing fatigue and malaise, not worsening from what she reported earlier this week.  No medication changes since Monday.  She is okay with potentially staying for admission for consideration of inpatient procedures.   EGD 03/05/2018: -Erosive reflux esophagitis -Small hiatal hernia -Erosive gastropathy s/p biopsy -Stomach biopsy with  reactive gastropathy, negative H. Pylori   Colonoscopy 03/05/2018: -Sigmoid and descending diverticulosis -2 polyps in the rectum and descending colon -13 mm polyp in hepatic flexure -Pathology with hyperplastic and serrated polyps -Repeat TCS in 3 years.   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   Unspecified asthma with (acute) exacerbation    Vitamin D deficiency     Past Surgical History:  Procedure Laterality Date   BIOPSY  03/05/2018   Procedure: BIOPSY;  Surgeon: Corbin Ade, MD;  Location: [facility];  Service: Endoscopy;;  gastric   CHOLECYSTECTOMY     COLONOSCOPY WITH PROPOFOL N/A 03/05/2018   Dr. Jena Gauss: Diverticulosis in the sigmoid and descending colon.  3 colon polyps removed, 2 hyperplastic and one sessile serrated polyp.  Next colonoscopy 3 years.   ESOPHAGOGASTRODUODENOSCOPY (EGD) WITH PROPOFOL N/A 03/05/2018   Dr. Jena Gauss: Erosive reflux esophagitis, small hiatal hernia, erosive gastropathy with reactive gastropathy on biopsy.  No H. pylori.   HERNIA  REPAIR     umbilical   POLYPECTOMY  03/05/2018   Procedure: POLYPECTOMY;  Surgeon: Corbin Ade, MD;  Location: [facility];  Service: Endoscopy;;  colon   SHOULDER ARTHROSCOPY Right    TEE WITHOUT CARDIOVERSION  2018   patent foramen ovale   TONSILLECTOMY AND ADENOIDECTOMY     TUBAL LIGATION      Prior to Admission medications   Medication Sig Start Date End Date Taking? Authorizing Provider  Accu-Chek  Softclix Lancets lancets Use as instructed to monitor glucose 1 times daily. 11/06/22   Dani Gobble, NP  albuterol (PROVENTIL HFA;VENTOLIN HFA) 108 (90 Base) MCG/ACT inhaler Inhale 1 puff into the lungs every 6 (six) hours as needed for wheezing or shortness of breath.    [provider]  ALPRAZolam Prudy Feeler) 0.25 MG tablet Take 0.125-0.25 mg by mouth daily as needed for anxiety. Patient not taking: Reported on 12/23/2022 12/16/20   [provider]  Blood Glucose Monitoring Suppl (ACCU-CHEK GUIDE ME) w/Device KIT 1 Piece by Does not apply route as directed. 11/02/19   Roma Kayser, MD  BREO ELLIPTA 200-25 MCG/ACT AEPB Inhale 1 puff into the lungs daily. 12/04/22   [provider]  budesonide-formoterol (SYMBICORT) 80-4.5 MCG/ACT inhaler Inhale 2 puffs into the lungs 2 (two) times daily. Patient not taking: Reported on 12/23/2022    [provider]  cyclobenzaprine (FLEXERIL) 5 MG tablet Take 5 mg by mouth 3 (three) times daily as needed for muscle spasms. 08/18/17   [provider]  fluticasone (FLONASE) 50 MCG/ACT nasal spray Place 1 spray into both nostrils daily as needed for allergies or rhinitis.     [provider]  glucose blood (ACCU-CHEK GUIDE) test strip USE TO CHECK BLOOD SUGARS ONCE DAILY 12/09/22   Dani Gobble, NP  HYDROcodone-acetaminophen (NORCO/VICODIN) 5-325 MG tablet Take 1 tablet by mouth daily as needed for moderate pain. Patient not taking: Reported on 12/23/2022 09/28/19   [provider]  ibuprofen (ADVIL) 600 MG tablet Take 600 mg by mouth every 8 (eight) hours as needed (pain.). 12/10/21   [provider]  ipratropium-albuterol (DUONEB) 0.5-2.5 (3) MG/3ML SOLN Take 3 mLs by nebulization every 6 (six) hours as needed (shortness of breath). Patient not taking: Reported on 06/04/2022 01/05/21   [provider]  lisinopril (ZESTRIL) 20 MG tablet Take 20 mg by mouth 2 (two) times daily.     [provider]  metFORMIN (GLUCOPHAGE) 500 MG tablet Take 500 mg by mouth 2 (two) times daily with a meal.    [provider]  Multiple Vitamins-Minerals (ONE DAILY MULTIVIT/IRON-FREE) TABS Take 1 tablet by mouth every evening.    [provider]  Na Sulfate-K Sulfate-Mg Sulf 17.5-3.13-1.6 GM/177ML SOLN As directed 12/23/22   [name], [name], MD  RABEprazole (ACIPHEX) 20 MG tablet Take 1 tablet (20 mg total) by mouth 2 (two) times daily before a meal. 12/23/22   Lundyn Coste, Frederik Schmidt, NP  sodium chloride (OCEAN) 0.65 % SOLN nasal spray Place 1 spray into both nostrils as needed for congestion.    [provider]  traMADol (ULTRAM) 50 MG tablet Take 50-100 mg by mouth every 8 (eight) hours as needed for moderate pain. 09/20/16   [provider]  triamcinolone (KENALOG) 0.025 % cream Apply 1 Application topically as needed (rash). 11/16/19   [provider]    Current Facility-Administered Medications  Medication Dose Route Frequency Provider Last Rate Last Admin   0.9 %  sodium  chloride infusion (Manually program via Guardrails IV Fluids)   Intravenous Once Derwood Kaplan, MD       Current Outpatient Medications  Medication Sig Dispense Refill   Accu-Chek Softclix Lancets lancets Use as instructed to monitor glucose 1 times daily. 100 each 5   albuterol (PROVENTIL HFA;VENTOLIN HFA) 108 (90 Base) MCG/ACT inhaler Inhale 1 puff into the lungs every 6 (six) hours as needed for wheezing or shortness of breath.     ALPRAZolam (XANAX) 0.25 MG tablet Take 0.125-0.25 mg by mouth daily as needed for anxiety. (Patient not taking: Reported on 12/23/2022)     Blood Glucose Monitoring Suppl (ACCU-CHEK GUIDE ME) w/Device KIT 1 Piece by Does not apply route as directed. 1 kit 0   BREO ELLIPTA 200-25 MCG/ACT AEPB Inhale 1 puff into the lungs daily.     budesonide-formoterol (SYMBICORT) 80-4.5 MCG/ACT inhaler Inhale 2 puffs into the lungs 2 (two) times daily. (Patient  not taking: Reported on 12/23/2022)     cyclobenzaprine (FLEXERIL) 5 MG tablet Take 5 mg by mouth 3 (three) times daily as needed for muscle spasms.  0   fluticasone (FLONASE) 50 MCG/ACT nasal spray Place 1 spray into both nostrils daily as needed for allergies or rhinitis.      glucose blood (ACCU-CHEK GUIDE) test strip USE TO CHECK BLOOD SUGARS ONCE DAILY 100 strip 1   HYDROcodone-acetaminophen (NORCO/VICODIN) 5-325 MG tablet Take 1 tablet by mouth daily as needed for moderate pain. (Patient not taking: Reported on 12/23/2022)     ibuprofen (ADVIL) 600 MG tablet Take 600 mg by mouth every 8 (eight) hours as needed (pain.).     ipratropium-albuterol (DUONEB) 0.5-2.5 (3) MG/3ML SOLN Take 3 mLs by nebulization every 6 (six) hours as needed (shortness of breath). (Patient not taking: Reported on 06/04/2022)     lisinopril (ZESTRIL) 20 MG tablet Take 20 mg by mouth 2 (two) times daily.     metFORMIN (GLUCOPHAGE) 500 MG tablet Take 500 mg by mouth 2 (two) times daily with a meal.     Multiple Vitamins-Minerals (ONE DAILY MULTIVIT/IRON-FREE) TABS Take 1 tablet by mouth every evening.     Na Sulfate-K Sulfate-Mg Sulf 17.5-3.13-1.6 GM/177ML SOLN As directed 354 mL 0   RABEprazole (ACIPHEX) 20 MG tablet Take 1 tablet (20 mg total) by mouth 2 (two) times daily before a meal. 180 tablet 3   sodium chloride (OCEAN) 0.65 % SOLN nasal spray Place 1 spray into both nostrils as needed for congestion.     traMADol (ULTRAM) 50 MG tablet Take 50-100 mg by mouth every 8 (eight) hours as needed for moderate pain.  3   triamcinolone (KENALOG) 0.025 % cream Apply 1 Application topically as needed (rash).      Allergies as of 12/26/2022 - Review Complete 12/26/2022  Allergen Reaction Noted   Asa [aspirin] Anaphylaxis 09/24/2016   Augmentin [amoxicillin-pot clavulanate] Other (See Comments) 09/24/2016   Levaquin [levofloxacin in d5w] Itching and Other (See Comments) 09/24/2016   Lovenox [enoxaparin sodium] Other (See  Comments) 09/24/2016    Family History  Problem Relation Age of Onset   Breast cancer Maternal Aunt    Colon cancer Other        maternal great grandfather   Liver disease Brother        Fatty liver   HIV Brother    Hypertension Mother    Hyperlipidemia Mother    Hypertension Father    Thyroid disease Sister    Pancreatic cancer Sister    Healthy  Daughter    Healthy Son    Healthy Son    Heart disease Son     Social History   Socioeconomic History   Marital status: Legally Separated    Spouse name: Not on file   Number of children: Not on file   Years of education: Not on file   Highest education level: Not on file  Occupational History   Not on file  Tobacco Use   Smoking status: Every Day    Current packs/day: 0.50    Average packs/day: 0.5 packs/day for 40.0 years (20.0 ttl pk-yrs)    Types: Cigarettes   Smokeless tobacco: Never  Vaping Use   Vaping status: Never Used  Substance and Sexual Activity   Alcohol use: No   Drug use: No   Sexual activity: Not Currently    Birth control/protection: Post-menopausal, Surgical    Comment: tubal  Other Topics Concern   Not on file  Social History Narrative   Not on file   Social Determinants of Health   Financial Resource Strain: Medium Risk (06/04/2022)   Overall Financial Resource Strain (CARDIA)    Difficulty of Paying Living Expenses: Somewhat hard  Food Insecurity: No Food Insecurity (06/04/2022)   Hunger Vital Sign    Worried About Running Out of Food in the Last Year: Never true    Ran Out of Food in the Last Year: Never true  Transportation Needs: No Transportation Needs (11/04/2022)   PRAPARE - Administrator, Civil Service (Medical): No    Lack of Transportation (Non-Medical): No  Physical Activity: Insufficiently Active (09/17/2022)   Exercise Vital Sign    Days of Exercise per Week: 3 days    Minutes of Exercise per Session: 30 min  Stress: No Stress Concern Present (06/04/2022)   [employer] of Occupational Health - Occupational Stress Questionnaire    Feeling of Stress : Only a little  Social Connections: Socially Isolated (06/04/2022)   Social Connection and Isolation Panel [NHANES]    Frequency of Communication with Friends and Family: More than three times a week    Frequency of Social Gatherings with Friends and Family: More than three times a week    Attends Religious Services: Never    Database administrator or Organizations: No    Attends Banker Meetings: Never    Marital Status: Separated  Intimate Partner Violence: Not At Risk (06/04/2022)   Humiliation, Afraid, Rape, and Kick questionnaire    Fear of Current or Ex-Partner: No    Emotionally Abused: No    Physically Abused: No    Sexually Abused: No     Review of Systems   Gen: Denies any fever, chills, loss of appetite, change in weight or weight loss CV: Denies chest pain, heart palpitations, syncope, edema  Resp: Denies shortness of breath with rest, cough, wheezing, coughing up blood, and pleurisy. GI: Denies vomiting blood, jaundice, and fecal incontinence.   Denies dysphagia or odynophagia. GU : Denies urinary burning, blood in urine, urinary frequency, and urinary incontinence. MS: Denies joint pain, limitation of movement, swelling, cramps, and atrophy.  Derm: Denies rash, itching, dry skin, hives. Psych: Denies depression, anxiety, memory loss, hallucinations, and confusion. Heme: Denies bruising or bleeding Neuro:  Denies any headaches, dizziness, paresthesias, shaking  Physical Exam   Vital Signs in last 24 hours: Temp:  [98.7 F (37.1 C)] 98.7 F (37.1 C) (09/26 1102) Pulse Rate:  [90] 90 (09/26 1102) Resp:  [18] 18 (  09/26 1102) BP: (149)/(55) 149/55 (09/26 1102) SpO2:  [96 %] 96 % (09/26 1102) Weight:  [105.7 kg] 105.7 kg (09/26 1103)    General:   Alert,  Well-developed, well-nourished, pleasant and cooperative in NAD Head:  Normocephalic and atraumatic. Eyes:   Sclera clear, no icterus.   Conjunctiva pink. Ears:  Normal auditory acuity. Mouth:  No deformity or lesions, dentition normal. Neck:  Supple; no masses Lungs:  Clear throughout to auscultation.   No wheezes, crackles, or rhonchi. No acute distress. Heart:  Regular rate and rhythm; no murmurs, clicks, rubs,  or gallops. Abdomen:  Soft, nontender and nondistended. No masses, hepatosplenomegaly or hernias noted. Normal bowel sounds, without guarding, and without rebound.   Rectal: deferred   Msk:  Symmetrical without gross deformities. Normal posture. Extremities:  Nonpitting edema to left lower extremity.  Varicose veins present to left lower extremity, spider veins present to right lower extremity. . Neurologic:  Alert and  oriented x4. Skin:  Intact without significant lesions or rashes. Psych:  Alert and cooperative. Normal mood and affect.  Intake/Output from previous day: No intake/output data recorded. Intake/Output this shift: No intake/output data recorded.  Labs/Studies   Recent Labs Recent Labs    12/25/22 0759 12/26/22 1121  WBC 7.0 6.7  HGB 6.4* 6.5*  HCT 24.8* 24.9*  PLT 200 194   BMET Recent Labs    12/25/22 0759  NA 139  K 4.2  CL 104  CO2 25  GLUCOSE 187*  BUN 14  CREATININE 0.67  CALCIUM 9.0   LFT Recent Labs    12/25/22 0759  PROT 6.3  AST 26  ALT 14  BILITOT 0.3   PT/INR Recent Labs    12/25/22 0759  LABPROT 10.8  INR 1.0   Hepatitis Panel No results for input(s): "HEPBSAG", "HCVAB", "HEPAIGM", "HEPBIGM" in the last 72 hours. C-Diff No results for input(s): "CDIFFTOX" in the last 72 hours.  Radiology/Studies No results found.   Assessment   Wendy Kramer is a 62 y.o. year old female with a history of diabetes, arthritis, GERD, HTN, PFO, stroke, asthma, kidney stones, colon polyps, and NASH cirrhosis who presented to the ED at my recommendation given outpatient labs with hgb 6.4 and recommended blood transfusion. GI consulted  given request for admission given symptomatic anemia with cirrhosis.   Cirrhosis: Secondary to [facility]. Well compensated.  No evidence of hepatic encephalopathy, ascites, or portal hypertension.  Has upcoming ultrasound for hepatoma screening.  Recent labs indicate MELD Na: 6.  aFP within normal limits.  Once able to have diet she should resume 2 g sodium diet.  For now we will continue clear liquids in anticipation for potential procedures tomorrow.  Anemia: Hemoglobin 6.4 on outpatient labs today.  Her last hemoglobin was 10 in January.  Continues to experience worsening fatigue and over the last several weeks/months she has had some worsening shortness of breath as well.  Denies any melena or BRBPR at all.  No hematemesis or hematuria.  Has tried different forms of oral iron over-the-counter however has been unable to tolerate given nausea.  Is in need of updated EGD and colonoscopy which has been attempted to be scheduled multiple times outpatient with current plans for 11/11.  Given her hemoglobin has dropped 4 points in the last 8 months we will likely plan for inpatient procedures, will discuss this further with Dr. Tasia Catchings.  On PPI twice daily outpatient and will continue while inpatient. Will be receiving 2u PRBC.  Will plan to  complete inpatient given severe iron deficiency.   Plan / Recommendations   Trend H/H, transfuse for Hgb <7 Agree with current plan for transfusion Monitor for overt GI bleeding Clear liquid diet Will discuss potential for inpatient EGD/Colonoscopy with Dr. Tasia Catchings PPI BID Given no overt bleeding will hold on octreotide/rocephin for now  Update: Patient apparently developed potential transfusion reaction after half blood transfused (hives). Will need to repeat CBC and if not above 7 then will not prep tonight for procedures tomorrow. Will discuss this and plan going forward if blood needed and defer transfusion reaction management to hospitalist. Will go ahead and order prep  but do not give unless Hgb >7 in anticipation for procedures tomorrow.    12/26/2022, 1:10 PM  Brooke Bonito, MSN, FNP-BC, AGACNP-BC [facility]

## 2022-12-26 NOTE — ED Triage Notes (Signed)
Pt presents with abnormal labs, performed on yesterday, Hbg 6.4.

## 2022-12-26 NOTE — ED Notes (Signed)
Paged Dr Jena Gauss

## 2022-12-26 NOTE — Progress Notes (Signed)
   12/26/22 1445  Assess: MEWS Score  Temp 98.1 F (36.7 C)  BP (!) 148/55  MAP (mmHg) 82  Pulse Rate (!) 104  SpO2 97 %  Assess: MEWS Score  MEWS Temp 0  MEWS Systolic 0  MEWS Pulse 1  MEWS RR 1  MEWS LOC 0  MEWS Score 2  MEWS Score Color Yellow  Assess: if the MEWS score is Yellow or Red  Were vital signs accurate and taken at a resting state? Yes  Does the patient meet 2 or more of the SIRS criteria? Yes  Does the patient have a confirmed or suspected source of infection? No  MEWS guidelines implemented  Yes, yellow  Treat  MEWS Interventions Considered administering scheduled or prn medications/treatments as ordered  Take Vital Signs  Increase Vital Sign Frequency  Yellow: Q2hr x1, continue Q4hrs until patient remains green for 12hrs  Escalate  MEWS: Escalate Yellow: Discuss with charge nurse and consider notifying provider and/or RRT  Notify: Charge Nurse/RN  Name of Charge Nurse/RN Notified angel ray, RN  Provider Notification  Provider Name/Title Dr Sherryll Burger  Date Provider Notified 12/26/22  Time Provider Notified 1445  Method of Notification Page  Notification Reason Change in status;Other (Comment) (suspected transfusion reactoin)  Provider response  (responded to rapid response)  Date of Provider Response 12/26/22  Time of Provider Response 1500  Notify: Rapid Response  Name of Rapid Response RN Notified mary mills, RN  Date Rapid Response Notified 12/26/22  Time Rapid Response Notified 1445  Assess: SIRS CRITERIA  SIRS Temperature  0  SIRS Pulse 1  SIRS Respirations  1  SIRS WBC 0  SIRS Score Sum  2

## 2022-12-26 NOTE — Progress Notes (Addendum)
   12/26/22 1401  Vitals  Temp 98.3 F (36.8 C)  Temp Source Oral  BP (!) 133/58  MAP (mmHg) 78  BP Method Automatic  Pulse Rate 85  Pulse Rate Source Monitor  MEWS COLOR  MEWS Score Color Green  Oxygen Therapy  SpO2 97 %  O2 Device Room Air  MEWS Score  MEWS Temp 0  MEWS Systolic 0  MEWS Pulse 0  MEWS RR 0  MEWS LOC 0  MEWS Score 0   New admit from ED blood transfusion in process

## 2022-12-26 NOTE — Progress Notes (Signed)
   12/26/22 1423  Transfuse RBC  Volume 157.5  Patient had a suspected reaction? Yes  Suspected Transfusion Reaction  Reaction Symptoms Hives/skin reaction (ears  throbing. ears and face redness)  Reaction Interventions Transfusion stopped, Saline hung with new IV tubing;IV tubing and remaining blood sent to lab  Physician Notified? Yes  Blood Bank Notified? Yes  Time Reaction Noted 1423  Time Infusion Interrupted 1423  Unit Number [id number]   Notified by assigned RN that patient was having suspected transfusion reaction. Blood transfusion had already been stopped and NS started upon arrival to room. Patient reported "my ears started hurting and I've got hives". On assessment, patient noted to have facial redness, hives across chest, neck, abdomen and back. Assigned RN had sent secure chat to provider. Dr. Sherryll Burger was paged x 1 to notify. Vitals stable, denied shortness of breath or throat discomfort. Within 10 minutes hives worsened and generalized whelps noted. Facial redness had worsened. HR tachy in the low 100s and patient reported "some shortness of breath but not too bad". Rapid response called. AC, RT, SWOT RN responded. Dr Sherryll Burger assessed at bedside and placed orders. Blood bag and tubing returned to blood bank with transfusion reaction form completed by assigned RN.

## 2022-12-26 NOTE — Progress Notes (Addendum)
   12/26/22 1423  Transfuse RBC  Volume 157.5  Patient had a suspected reaction? Yes  Suspected Transfusion Reaction  Reaction Symptoms Hives/skin reaction (ears  throbing. ears and face redness)  Reaction Interventions Transfusion stopped, Saline hung with new IV tubing;IV tubing and remaining blood sent to lab  Physician Notified? Yes  Blood Bank Notified? Yes  Time Reaction Noted 1423  Time Infusion Interrupted 1423  Volume Received Prior to Suspected Reaction 180 mL (aproximate)  Unit Number [id number]   Patient c/o ears throbbing itching,redness to face. Noted breaking out into hives. Blood transfusion stopped. N/saline bolus wide open. MD called. Rapid response paged. Vitals done. Blood transfusion reaction protocol done.

## 2022-12-26 NOTE — H&P (Signed)
History and Physical    Cherilee Menard WGN:[id number] DOB: [birth year] DOA: 12/26/2022  PCP: Benetta Spar, MD   Patient coming from: Home  Chief Complaint: Low hemoglobin/fatigue  HPI: Wendy Kramer is a 62 y.o. female with medical history significant for GERD, hypertension, prior CVA, NASH cirrhosis, colon polyps, and erosive esophagitis along with hiatal hernia, type 2 diabetes, and osteoarthritis who presented to the ED with worsening fatigue and malaise over the last few weeks.  She has also been noticing some cramps in her legs and some shortness of breath with exertion.  She saw GI and her PCP and had lab workup outpatient which indicated hemoglobin of 6.4.  She was advised to come to the ED.  She denies any bloody stools or melena, nausea, vomiting, or abdominal pain.  She is not currently on any blood thinners.   ED Course: Vital signs stable and patient afebrile.  Hemoglobin noted to be 6.5 and she has been started on PRBC transfusion along with PPI.  Case discussed with GI who plans for likely inpatient endoscopy.  Review of Systems: Reviewed as noted above, otherwise negative.  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   Unspecified asthma with (acute) exacerbation    Vitamin D deficiency     Past Surgical History:  Procedure Laterality Date   BIOPSY  03/05/2018   Procedure: BIOPSY;  Surgeon: Corbin Ade, MD;  Location: [facility];  Service: Endoscopy;;  gastric    CHOLECYSTECTOMY     COLONOSCOPY WITH PROPOFOL N/A 03/05/2018   Dr. Jena Gauss: Diverticulosis in the sigmoid and descending colon.  3 colon polyps removed, 2 hyperplastic and one sessile serrated polyp.  Next colonoscopy 3 years.   ESOPHAGOGASTRODUODENOSCOPY (EGD) WITH PROPOFOL N/A 03/05/2018   Dr. Jena Gauss: Erosive reflux esophagitis, small hiatal hernia, erosive gastropathy with reactive gastropathy on biopsy.  No H. pylori.   HERNIA REPAIR     umbilical   POLYPECTOMY  03/05/2018   Procedure: POLYPECTOMY;  Surgeon: Corbin Ade, MD;  Location: [facility];  Service: Endoscopy;;  colon   SHOULDER ARTHROSCOPY Right    TEE WITHOUT CARDIOVERSION  2018   patent foramen ovale   TONSILLECTOMY AND ADENOIDECTOMY     TUBAL LIGATION       reports that she has been smoking cigarettes. She has a 20 pack-year smoking history. She has never used smokeless tobacco. She reports that she does not drink alcohol and does not use drugs.  Allergies  Allergen Reactions   Asa [Aspirin] Anaphylaxis    Tolerates other NSAIDS   Augmentin [Amoxicillin-Pot Clavulanate] Other (See Comments)    "Retching and pass out"   Levaquin [Levofloxacin In D5w] Itching and Other (See Comments)    "body turned red"   Lovenox [Enoxaparin Sodium] Other (See Comments)    phsyician instructed not to use ever  again. "white veins popped out"    Family History  Problem Relation Age of Onset   Breast cancer Maternal Aunt    Colon cancer Other        maternal great grandfather   Liver disease Brother        Fatty liver   HIV Brother    Hypertension Mother    Hyperlipidemia Mother    Hypertension Father    Thyroid disease Sister    Pancreatic cancer Sister    Healthy Daughter    Healthy Son    Healthy Son    Heart disease Son     Prior to Admission medications   Medication Sig Start Date End Date Taking? Authorizing Provider  Accu-Chek Softclix Lancets lancets Use as instructed to monitor glucose 1 times daily. 11/06/22    Dani Gobble, NP  albuterol (PROVENTIL HFA;VENTOLIN HFA) 108 (90 Base) MCG/ACT inhaler Inhale 1 puff into the lungs every 6 (six) hours as needed for wheezing or shortness of breath.    [provider]  ALPRAZolam Prudy Feeler) 0.25 MG tablet Take 0.125-0.25 mg by mouth daily as needed for anxiety. Patient not taking: Reported on 12/23/2022 12/16/20   [provider]  Blood Glucose Monitoring Suppl (ACCU-CHEK GUIDE ME) w/Device KIT 1 Piece by Does not apply route as directed. 11/02/19   Roma Kayser, MD  BREO ELLIPTA 200-25 MCG/ACT AEPB Inhale 1 puff into the lungs daily. 12/04/22   [provider]  budesonide-formoterol (SYMBICORT) 80-4.5 MCG/ACT inhaler Inhale 2 puffs into the lungs 2 (two) times daily. Patient not taking: Reported on 12/23/2022    [provider]  cyclobenzaprine (FLEXERIL) 5 MG tablet Take 5 mg by mouth 3 (three) times daily as needed for muscle spasms. 08/18/17   [provider]  fluticasone (FLONASE) 50 MCG/ACT nasal spray Place 1 spray into both nostrils daily as needed for allergies or rhinitis.     [provider]  glucose blood (ACCU-CHEK GUIDE) test strip USE TO CHECK BLOOD SUGARS ONCE DAILY 12/09/22   Dani Gobble, NP  HYDROcodone-acetaminophen (NORCO/VICODIN) 5-325 MG tablet Take 1 tablet by mouth daily as needed for moderate pain. Patient not taking: Reported on 12/23/2022 09/28/19   [provider]  ibuprofen (ADVIL) 600 MG tablet Take 600 mg by mouth every 8 (eight) hours as needed (pain.). 12/10/21   [provider]  ipratropium-albuterol (DUONEB) 0.5-2.5 (3) MG/3ML SOLN Take 3 mLs by nebulization every 6 (six) hours as needed (shortness of breath). Patient not taking: Reported on 06/04/2022 01/05/21   [provider]  lisinopril (ZESTRIL) 20 MG tablet Take 20 mg by mouth 2 (two) times daily.    [provider]  metFORMIN (GLUCOPHAGE) 500 MG tablet Take 500 mg by mouth 2 (two)  times daily with a meal.    [provider]  Multiple Vitamins-Minerals (ONE DAILY MULTIVIT/IRON-FREE) TABS Take 1 tablet by mouth every evening.    [provider]  Na Sulfate-K Sulfate-Mg Sulf 17.5-3.13-1.6 GM/177ML SOLN As directed 12/23/22   [name], [name], MD  RABEprazole (ACIPHEX) 20 MG tablet Take 1 tablet (20 mg total) by mouth 2 (two) times daily before a meal. 12/23/22   Mahon, Frederik Schmidt, NP  sodium chloride (OCEAN) 0.65 % SOLN nasal spray Place 1 spray into both nostrils as needed for congestion.    [provider]  traMADol (ULTRAM) 50 MG tablet Take 50-100 mg by mouth every 8 (eight) hours as needed for moderate pain. 09/20/16   [provider]  triamcinolone (KENALOG) 0.025 % cream Apply 1 Application topically as needed (rash). 11/16/19   [provider]    Physical Exam: Vitals:   12/26/22 1102 12/26/22 1103 12/26/22 1317 12/26/22 1332  BP: (!) 149/55  (!) 157/66 (!) 150/61  Pulse: 90  90 84  Resp: 18  20 20   Temp: 98.7 F (37.1 C)  98.3 F (36.8 C) 98.1 F (36.7 C)  TempSrc: Oral  Oral Oral  SpO2: 96%  96% 97%  Weight:  105.7 kg    Height:  5\' 3"  (1.6 m)      Constitutional: NAD, calm, comfortable Vitals:   12/26/22 1102 12/26/22 1103 12/26/22 1317 12/26/22 1332  BP: (!) 149/55  (!) 157/66 (!) 150/61  Pulse: 90  90 84  Resp: 18  20 20   Temp: 98.7 F (37.1 C)  98.3 F (36.8 C) 98.1 F (36.7 C)  TempSrc: Oral  Oral Oral  SpO2: 96%  96% 97%  Weight:  105.7 kg    Height:  5\' 3"  (1.6 m)     Eyes: lids and conjunctivae normal Neck: normal, supple Respiratory: clear to auscultation bilaterally. Normal respiratory effort. No accessory muscle use.  Cardiovascular: Regular rate and rhythm, no murmurs. Abdomen: no tenderness, no distention. Bowel sounds positive.  Musculoskeletal:  No edema. Skin: no rashes, lesions, ulcers.  Psychiatric: Flat affect  Labs on Admission: I have personally reviewed following labs and  imaging studies  CBC: Recent Labs  Lab 12/25/22 0759 12/26/22 1121  WBC 7.0 6.7  HGB 6.4* 6.5*  HCT 24.8* 24.9*  MCV 68.3* 68.6*  PLT 200 194   Basic Metabolic Panel: Recent Labs  Lab 12/25/22 0759  NA 139  K 4.2  CL 104  CO2 25  GLUCOSE 187*  BUN 14  CREATININE 0.67  CALCIUM 9.0  MG 1.6   GFR: Estimated Creatinine Clearance: 83.7 mL/min (by C-G formula based on SCr of 0.67 mg/dL). Liver Function Tests: Recent Labs  Lab 12/25/22 0759  AST 26  ALT 14  BILITOT 0.3  PROT 6.3   No results for input(s): "LIPASE", "AMYLASE" in the last 168 hours. No results for input(s): "AMMONIA" in the last 168 hours. Coagulation Profile: Recent Labs  Lab 12/25/22 0759  INR 1.0   Cardiac Enzymes: No results for input(s): "CKTOTAL", "CKMB", "CKMBINDEX", "TROPONINI" in the last 168 hours. BNP (last 3 results) No results for input(s): "PROBNP" in the last 8760 hours. HbA1C: No results for input(s): "HGBA1C" in the last 72 hours. CBG: No results for input(s): "GLUCAP" in the last 168 hours. Lipid Profile: No results for input(s): "CHOL", "HDL", "LDLCALC", "TRIG", "CHOLHDL", "LDLDIRECT" in the last 72 hours. Thyroid Function Tests: No results for input(s): "TSH", "T4TOTAL", "FREET4", "T3FREE", "THYROIDAB" in the last 72 hours. Anemia Panel: Recent Labs    12/25/22 0759 12/26/22 1125  VITAMINB12  --  737  FOLATE  --  24.3  FERRITIN 3* 3*  TIBC 479* 530*  IRON 12* 12*  RETICCTPCT  --  1.6   Urine analysis:    Component Value Date/Time   COLORURINE YELLOW 10/21/2017 2051   APPEARANCEUR HAZY (A) 10/21/2017 2051   LABSPEC 1.019 10/21/2017 2051   PHURINE 5.0 10/21/2017 2051   GLUCOSEU NEGATIVE 10/21/2017 2051   HGBUR SMALL (A) 10/21/2017 2051   BILIRUBINUR NEGATIVE 10/21/2017 2051   KETONESUR NEGATIVE 10/21/2017 2051   PROTEINUR NEGATIVE 10/21/2017 2051   NITRITE NEGATIVE 10/21/2017 2051   LEUKOCYTESUR NEGATIVE 10/21/2017 2051    Radiological Exams  on  Admission: No results found.  Assessment/Plan Principal Problem:   Acute blood loss anemia Active Problems:   Gastroesophageal reflux disease   Diabetes mellitus without complication (HCC)   Mixed hyperlipidemia   Morbid obesity (HCC)   Cirrhosis of liver without ascites (HCC)   History of colonic polyps   Acute anemia    Acute anemia-suspect blood loss with noted iron deficiency Appreciate GI evaluation with potential plans for inpatient endoscopy Clear liquid diet and likely n.p.o. after midnight IV PPI twice daily Transfusion of PRBCs currently ordered Monitor CBC  NASH cirrhosis Right upper quadrant ultrasound ordered outpatient along with further workup that can likely be completed inpatient per GI  GERD with prior erosive esophagitis/hiatal hernia IV PPI twice daily  Type 2 diabetes with hyperglycemia Hold metformin while on clear liquid diet SSI  Morbid obesity BMI 41.27   DVT prophylaxis: SCDs Code Status: Full Family Communication: None at bedside Disposition Plan:Admit for GI evaluation and transfusion Consults called:GI Admission status: Inpatient, Tele  Severity of Illness: The appropriate patient status for this patient is INPATIENT. Inpatient status is judged to be reasonable and necessary in order to provide the required intensity of service to ensure the patient's safety. The patient's presenting symptoms, physical exam findings, and initial radiographic and laboratory data in the context of their chronic comorbidities is felt to place them at high risk for further clinical deterioration. Furthermore, it is not anticipated that the patient will be medically stable for discharge from the hospital within 2 midnights of admission.   * I certify that at the point of admission it is my clinical judgment that the patient will require inpatient hospital care spanning beyond 2 midnights from the point of admission due to high intensity of service, high risk for  further deterioration and high frequency of surveillance required.*   Kashana Breach D Katerra Ingman DO [facility]  If 7PM-7AM, please contact night-coverage [email or web address]  12/26/2022, 1:49 PM

## 2022-12-26 NOTE — ED Provider Notes (Signed)
[facility] AT [facility] Provider Note   CSN: [id number] Arrival date & time: 12/26/22  1025     History  Chief Complaint  Patient presents with   Abnormal Labs    HGB    Wendy Kramer is a 62 y.o. female.  HPI    62 y.o. female with a history of diabetes, arthritis, GERD, HTN, PFO, stroke, kidney stones, NASH liver cirrhosis comes in with cc of low Hb.  Patient indicates that over the last few weeks she has been having profound fatigue.  She saw her PCP and GI doctor, they had done some lab workup and indicated hemoglobin of 6.4.  Patient was advised to come to the ER.  She has no previous history of blood transfusion.  Patient denies any bloody stools or melena.  She has had both upper and lower endoscopy in the past with Dr. Jena Gauss.  Review of system is also positive for some dizziness with exertion.  Patient currently not on any blood thinners.  Home Medications Prior to Admission medications   Medication Sig Start Date End Date Taking? Authorizing Provider  Accu-Chek Softclix Lancets lancets Use as instructed to monitor glucose 1 times daily. 11/06/22   Dani Gobble, NP  albuterol (PROVENTIL HFA;VENTOLIN HFA) 108 (90 Base) MCG/ACT inhaler Inhale 1 puff into the lungs every 6 (six) hours as needed for wheezing or shortness of breath.    [provider]  ALPRAZolam Prudy Feeler) 0.25 MG tablet Take 0.125-0.25 mg by mouth daily as needed for anxiety. Patient not taking: Reported on 12/23/2022 12/16/20   [provider]  Blood Glucose Monitoring Suppl (ACCU-CHEK GUIDE ME) w/Device KIT 1 Piece by Does not apply route as directed. 11/02/19   Roma Kayser, MD  BREO ELLIPTA 200-25 MCG/ACT AEPB Inhale 1 puff into the lungs daily. 12/04/22   [provider]  budesonide-formoterol (SYMBICORT) 80-4.5 MCG/ACT inhaler Inhale 2 puffs into the lungs 2 (two) times daily. Patient not taking: Reported on 12/23/2022    [provider]  cyclobenzaprine (FLEXERIL) 5 MG tablet Take 5 mg by mouth 3 (three) times daily as needed for muscle spasms. 08/18/17   [provider]  fluticasone (FLONASE) 50 MCG/ACT nasal spray Place 1 spray into both nostrils daily as needed for allergies or rhinitis.     [provider]  glucose blood (ACCU-CHEK GUIDE) test strip USE TO CHECK BLOOD SUGARS ONCE DAILY 12/09/22   Dani Gobble, NP  HYDROcodone-acetaminophen (NORCO/VICODIN) 5-325 MG tablet Take 1 tablet by mouth daily as needed for moderate pain. Patient not taking: Reported on 12/23/2022 09/28/19   [provider]  ibuprofen (ADVIL) 600 MG tablet Take 600 mg by mouth every 8 (eight) hours as needed (pain.). 12/10/21   [provider]  ipratropium-albuterol (DUONEB) 0.5-2.5 (3) MG/3ML SOLN Take 3 mLs by nebulization every 6 (six) hours as needed (shortness of breath). Patient not taking: Reported on 06/04/2022 01/05/21   [provider]  lisinopril (ZESTRIL) 20 MG tablet Take 20 mg by mouth 2 (two) times daily.    [provider]  metFORMIN (GLUCOPHAGE) 500 MG tablet Take 500 mg by mouth 2 (two) times daily with a meal.    [provider]  Multiple Vitamins-Minerals (ONE DAILY MULTIVIT/IRON-FREE) TABS Take 1 tablet by mouth every evening.    [provider]  Na Sulfate-K Sulfate-Mg Sulf 17.5-3.13-1.6 GM/177ML SOLN As directed 12/23/22   [name], [name], MD  RABEprazole (ACIPHEX) 20 MG tablet Take  1 tablet (20 mg total) by mouth 2 (two) times daily before a meal. 12/23/22   Mahon, Frederik Schmidt, NP  sodium chloride (OCEAN) 0.65 % SOLN nasal spray Place 1 spray into both nostrils as needed for congestion.    [provider]  traMADol (ULTRAM) 50 MG tablet Take 50-100 mg by mouth every 8 (eight) hours as needed for moderate pain. 09/20/16   [provider]  triamcinolone (KENALOG) 0.025 % cream Apply 1 Application topically as needed (rash). 11/16/19    [provider]      Allergies    Asa [aspirin], Augmentin [amoxicillin-pot clavulanate], Levaquin [levofloxacin in d5w], and Lovenox [enoxaparin sodium]    Review of Systems   Review of Systems  All other systems reviewed and are negative.   Physical Exam Updated Vital Signs BP (!) 149/55   Pulse 90   Temp 98.7 F (37.1 C) (Oral)   Resp 18   Ht 5\' 3"  (1.6 m)   Wt 105.7 kg   LMP 09/06/2016   SpO2 96%   BMI 41.27 kg/m  Physical Exam Vitals and nursing note reviewed.  Constitutional:      Appearance: She is well-developed.  HENT:     Head: Atraumatic.  Cardiovascular:     Rate and Rhythm: Normal rate.  Pulmonary:     Effort: Pulmonary effort is normal.  Musculoskeletal:     Cervical back: Normal range of motion and neck supple.  Skin:    General: Skin is warm and dry.     Coloration: Skin is pale.  Neurological:     Mental Status: She is alert and oriented to person, place, and time.     ED Results / Procedures / Treatments   Labs (all labs ordered are listed, but only abnormal results are displayed) Labs Reviewed  CBC - Abnormal; Notable for the following components:      Result Value   RBC 3.63 (*)    Hemoglobin 6.5 (*)    HCT 24.9 (*)    MCV 68.6 (*)    MCH 17.9 (*)    MCHC 26.1 (*)    RDW 19.2 (*)    All other components within normal limits  IRON AND TIBC - Abnormal; Notable for the following components:   Iron 12 (*)    TIBC 530 (*)    Saturation Ratios 2 (*)    All other components within normal limits  FERRITIN - Abnormal; Notable for the following components:   Ferritin 3 (*)    All other components within normal limits  RETICULOCYTES - Abnormal; Notable for the following components:   RBC. 3.63 (*)    Immature Retic Fract 25.8 (*)    All other components within normal limits  VITAMIN B12  FOLATE  TYPE AND SCREEN  PREPARE RBC (CROSSMATCH)  ABO/RH    EKG None  Radiology No results found.  Procedures .Critical  Care  Performed by: Derwood Kaplan, MD Authorized by: Derwood Kaplan, MD   Critical care provider statement:    Critical care time (minutes):  36   Critical care was necessary to treat or prevent imminent or life-threatening deterioration of the following conditions:  Circulatory failure   Critical care was time spent personally by me on the following activities:  Development of treatment plan with patient or surrogate, discussions with consultants, evaluation of patient's response to treatment, examination of patient, ordering and review of laboratory studies, ordering and review of radiographic studies, ordering and performing treatments and interventions, pulse  oximetry, re-evaluation of patient's condition, review of old charts and obtaining history from patient or surrogate     Medications Ordered in ED Medications  0.9 %  sodium chloride infusion (Manually program via Guardrails IV Fluids) ( Intravenous New Bag/Given 12/26/22 1315)  pantoprazole (PROTONIX) injection 80 mg (80 mg Intravenous Given 12/26/22 1219)    ED Course/ Medical Decision Making/ A&P                                 Medical Decision Making Amount and/or Complexity of Data Reviewed Labs: ordered.  Risk Prescription drug management. Decision regarding hospitalization.   This patient presents to the ED with chief complaint(s) of generalized weakness, exertional dizziness and low hemoglobin with pertinent past medical history of Elita Boone liver cirrhosis, diabetes, gastritis.The complaint involves an extensive differential diagnosis and also carries with it a high risk of complications and morbidity.    The differential diagnosis includes : Anemia of chronic disease, chronic blood loss anemia, iron deficiency anemia. Patient denies any melena or bloody stools.  The initial plan is to get consent for transfusion of 2 units of blood.  Patient is symptomatic, hemoglobin less than 7.  We will also proceed with  admission request and consult GI.  Patient informs me that she has been trying to get scopes done this year, but they have been delayed due to numerous reasons.   Additional history obtained: Records reviewed  upper and lower endoscopy that revealed gastritis, esophagitis and polyps  Independent labs interpretation:  The following labs were independently interpreted: Hemoglobin is confirmed less than 6.5.  I have reviewed the labs from yesterday including CMP, INR which do not need to be repeated.   Treatment and Reassessment: GI team has been consulted.  They have seen the patient and agree with the plan for admission.  Consultation: - Consulted or discussed management/test interpretation with external professional: GI team, they recommend admission.   Final Clinical Impression(s) / ED Diagnoses Final diagnoses:  Symptomatic anemia    Rx / DC Orders ED Discharge Orders     None         Derwood Kaplan, MD 12/26/22 1323

## 2022-12-27 ENCOUNTER — Inpatient Hospital Stay (HOSPITAL_COMMUNITY): Payer: Medicare Other | Admitting: Anesthesiology

## 2022-12-27 ENCOUNTER — Telehealth: Payer: Self-pay | Admitting: Gastroenterology

## 2022-12-27 ENCOUNTER — Encounter (HOSPITAL_COMMUNITY)
Admission: EM | Disposition: A | Discharge status: Home / Self Care | Payer: Self-pay | Source: Home / Self Care | Attending: Internal Medicine

## 2022-12-27 DIAGNOSIS — K5730 Diverticulosis of large intestine without perforation or abscess without bleeding: Secondary | ICD-10-CM | POA: Diagnosis not present

## 2022-12-27 DIAGNOSIS — K31819 Angiodysplasia of stomach and duodenum without bleeding: Secondary | ICD-10-CM

## 2022-12-27 DIAGNOSIS — K766 Portal hypertension: Secondary | ICD-10-CM | POA: Diagnosis not present

## 2022-12-27 DIAGNOSIS — K31811 Angiodysplasia of stomach and duodenum with bleeding: Secondary | ICD-10-CM | POA: Diagnosis not present

## 2022-12-27 DIAGNOSIS — D509 Iron deficiency anemia, unspecified: Secondary | ICD-10-CM | POA: Diagnosis not present

## 2022-12-27 DIAGNOSIS — K649 Unspecified hemorrhoids: Secondary | ICD-10-CM | POA: Diagnosis not present

## 2022-12-27 DIAGNOSIS — D179 Benign lipomatous neoplasm, unspecified: Secondary | ICD-10-CM | POA: Diagnosis not present

## 2022-12-27 DIAGNOSIS — D62 Acute posthemorrhagic anemia: Secondary | ICD-10-CM | POA: Diagnosis not present

## 2022-12-27 HISTORY — PX: COLONOSCOPY WITH PROPOFOL: SHX5780

## 2022-12-27 HISTORY — PX: ESOPHAGOGASTRODUODENOSCOPY (EGD) WITH PROPOFOL: SHX5813

## 2022-12-27 HISTORY — PX: HOT HEMOSTASIS: SHX5433

## 2022-12-27 LAB — GLUCOSE, CAPILLARY
Glucose-Capillary: 128 mg/dL — ABNORMAL HIGH (ref 70–99)
Glucose-Capillary: 143 mg/dL — ABNORMAL HIGH (ref 70–99)
Glucose-Capillary: 135 mg/dL — ABNORMAL HIGH (ref 70–99)
Glucose-Capillary: 151 mg/dL — ABNORMAL HIGH (ref 70–99)
Glucose-Capillary: 194 mg/dL — ABNORMAL HIGH (ref 70–99)

## 2022-12-27 LAB — BASIC METABOLIC PANEL
Anion gap: 9 (ref 5–15)
BUN: 9 mg/dL (ref 8–23)
CO2: 24 mmol/L (ref 22–32)
Calcium: 8.1 mg/dL — ABNORMAL LOW (ref 8.9–10.3)
Chloride: 104 mmol/L (ref 98–111)
Creatinine, Ser: 0.75 mg/dL (ref 0.44–1.00)
GFR, Estimated: >60 mL/min (ref >60)
Glucose, Bld: 124 mg/dL — ABNORMAL HIGH (ref 70–99)
Potassium: 3.5 mmol/L (ref 3.5–5.1)
Sodium: 137 mmol/L (ref 135–145)

## 2022-12-27 LAB — HIV ANTIBODY (ROUTINE TESTING W REFLEX): HIV Screen 4th Generation wRfx: NONREACTIVE

## 2022-12-27 LAB — AFP TUMOR MARKER: AFP-Tumor Marker: 2 ng/mL

## 2022-12-27 LAB — BPAM RBC: Blood Product Expiration Date: 202410082359

## 2022-12-27 LAB — MAGNESIUM: Magnesium: 1.6 mg/dL — ABNORMAL LOW (ref 1.7–2.4)

## 2022-12-27 SURGERY — COLONOSCOPY WITH PROPOFOL
Anesthesia: General

## 2022-12-27 MED ORDER — LACTATED RINGERS IV SOLN: INTRAVENOUS | Status: DC

## 2022-12-27 MED ORDER — GLYCOPYRROLATE PF 0.2 MG/ML IJ SOSY
PREFILLED_SYRINGE | INTRAMUSCULAR | Status: AC
  Filled 2022-12-27: qty 1

## 2022-12-27 MED ORDER — PROPOFOL 500 MG/50ML IV EMUL
INTRAVENOUS | Status: DC | PRN
  Administered 2022-12-27: 150 ug/kg/min via INTRAVENOUS

## 2022-12-27 MED ORDER — PROPOFOL 10 MG/ML IV BOLUS
INTRAVENOUS | Status: DC | PRN
  Administered 2022-12-27: 100 mg via INTRAVENOUS

## 2022-12-27 MED ORDER — SODIUM CHLORIDE 0.9 % IV SOLN
100.0000 mg | Freq: Once | INTRAVENOUS | Status: AC
  Administered 2022-12-27: 100 mg via INTRAVENOUS
  Filled 2022-12-27: qty 5

## 2022-12-27 MED ORDER — LIDOCAINE HCL (PF) 2 % IJ SOLN
INTRAMUSCULAR | Status: AC
  Filled 2022-12-27: qty 5

## 2022-12-27 MED ORDER — MAGNESIUM SULFATE 2 GM/50ML IV SOLN
2.0000 g | Freq: Once | INTRAVENOUS | Status: AC
  Administered 2022-12-27: 2 g via INTRAVENOUS
  Filled 2022-12-27: qty 50

## 2022-12-27 MED ORDER — PROPOFOL 500 MG/50ML IV EMUL
INTRAVENOUS | Status: AC
  Filled 2022-12-27: qty 50

## 2022-12-27 MED ORDER — PROPOFOL 10 MG/ML IV BOLUS
INTRAVENOUS | Status: AC
  Filled 2022-12-27: qty 20

## 2022-12-27 MED ORDER — SODIUM CHLORIDE 0.9 % IV SOLN: INTRAVENOUS | Status: DC

## 2022-12-27 MED ORDER — LIDOCAINE HCL (CARDIAC) PF 100 MG/5ML IV SOSY
PREFILLED_SYRINGE | INTRAVENOUS | Status: DC | PRN
  Administered 2022-12-27: 100 mg via INTRAVENOUS

## 2022-12-27 NOTE — Anesthesia Preprocedure Evaluation (Signed)
Anesthesia Evaluation  Patient identified by MRN, date of birth, ID band Patient awake    Reviewed: Allergy & Precautions, H&P , NPO status , Patient's Chart, lab work & pertinent test results, reviewed documented beta blocker date and time   Airway Mallampati: II  TM Distance: >3 FB Neck ROM: full    Dental no notable dental hx.    Pulmonary neg pulmonary ROS, asthma , sleep apnea , Current Smoker and Patient abstained from smoking.   Pulmonary exam normal breath sounds clear to auscultation       Cardiovascular Exercise Tolerance: Good hypertension, negative cardio ROS + Valvular Problems/Murmurs  Rhythm:regular Rate:Normal     Neuro/Psych  PSYCHIATRIC DISORDERS Anxiety     CVA negative neurological ROS  negative psych ROS   GI/Hepatic negative GI ROS, Neg liver ROS,GERD  ,,  Endo/Other  negative endocrine ROSdiabetes    Renal/GU negative Renal ROS  negative genitourinary   Musculoskeletal   Abdominal   Peds  Hematology negative hematology ROS (+) Blood dyscrasia, anemia   Anesthesia Other Findings   Reproductive/Obstetrics negative OB ROS                             Anesthesia Physical Anesthesia Plan  ASA: 4 and emergent  Anesthesia Plan: General   Post-op Pain Management:    Induction:   PONV Risk Score and Plan: Propofol infusion  Airway Management Planned:   Additional Equipment:   Intra-op Plan:   Post-operative Plan:   Informed Consent: I have reviewed the patients History and Physical, chart, labs and discussed the procedure including the risks, benefits and alternatives for the proposed anesthesia with the patient or authorized representative who has indicated his/her understanding and acceptance.     Dental Advisory Given  Plan Discussed with: CRNA  Anesthesia Plan Comments:        Anesthesia Quick Evaluation

## 2022-12-27 NOTE — Inpatient Diabetes Management (Signed)
Inpatient Diabetes Program Recommendations  AACE/ADA: New Consensus Statement on Inpatient Glycemic Control (2015)  Target Ranges:  Prepandial:   less than 140 mg/dL      Peak postprandial:   less than 180 mg/dL (1-2 hours)      Critically ill patients:  140 - 180 mg/dL   Lab Results  Component Value Date   GLUCAP 143 (H) 12/27/2022   HGBA1C 9.4 (H) 12/26/2022    Review of Glycemic Control  Diabetes history: DM 2 Outpatient Diabetes medications: Metformin 500 mg bid Current orders for Inpatient glycemic control:  Novolog 0-9 units Q4 hours  A1c 9.4% on 9/26 however low Hgb levels will need recheck in 3-4 months  Spoke with pt over the phone regarding A1c of 9.4% and glucose control at home. Pt reports checking her glucose everyday in the mornings. Pt states she recently had an A1c done and it was around the same. Pt reports she has not been following a diabetic diet lately as she has had many stressors and deaths in the family. Pt reports she is going to be better and has a follow up appointment with Dr. Fransico Him, Endocrinology, on October 7th.  Thanks,  Christena Deem RN, MSN, BC-ADM Inpatient Diabetes Coordinator Team Pager [phone] (8a-5p)

## 2022-12-27 NOTE — Progress Notes (Signed)
Pt lives with her daughter and grandchildren. She is independent with ADLs. No current home health services. TOC will follow.    12/27/22 0750  TOC Brief Assessment  Insurance and Status Reviewed  Patient has primary care physician Yes  Home environment has been reviewed Lives with daughter and grandchildren.  Prior level of function: Independent.  Prior/Current Home Services No current home services  Social Determinants of Health Reivew SDOH reviewed no interventions necessary  Readmission risk has been reviewed Yes  Transition of care needs no transition of care needs at this time

## 2022-12-27 NOTE — Op Note (Signed)
[facility] Patient Name: Wendy Kramer Procedure Date: 12/27/2022 11:29 AM MRN: [id number] Date of Birth: 1960-11-14 Attending MD: Sanjuan Dame , MD, [id number] CSN: [id number] Age: 62 Admit Type: Inpatient Procedure:                Colonoscopy Indications:              Acute post hemorrhagic anemia, Unexplained iron                            deficiency anemia Providers:                Sanjuan Dame, MD, Buel Ream. Thomasena Edis RN, RN,                            Lennice Sites Technician, Pensions consultant Referring MD:              Medicines:                Monitored Anesthesia Care Complications:            No immediate complications. Estimated Blood Loss:     Estimated blood loss: none. Procedure:                Pre-Anesthesia Assessment:                           - Prior to the procedure, a History and Physical                            was performed, and patient medications and                            allergies were reviewed. The patient's tolerance of                            previous anesthesia was also reviewed. The risks                            and benefits of the procedure and the sedation                            options and risks were discussed with the patient.                            All questions were answered, and informed consent                            was obtained. Prior Anticoagulants: The patient has                            taken no anticoagulant or antiplatelet agents. ASA                            Grade Assessment: III - A patient with severe  systemic disease. After reviewing the risks and                            benefits, the patient was deemed in satisfactory                            condition to undergo the procedure.                           After obtaining informed consent, the colonoscope                            was passed under direct vision. Throughout the                            procedure, the  patient's blood pressure, pulse, and                            oxygen saturations were monitored continuously. The                            [id number]) scope was introduced through the                            anus and advanced to the the cecum, identified by                            appendiceal orifice and ileocecal valve. The                            colonoscopy was performed without difficulty. The                            patient tolerated the procedure well. The quality                            of the bowel preparation was evaluated using the                            BBPS [facility] Bowel Preparation Scale) with scores                            of: Right Colon = 2 (minor amount of residual                            staining, small fragments of stool and/or opaque                            liquid, but mucosa seen well), Transverse Colon = 2                            (minor amount of residual staining, small fragments  of stool and/or opaque liquid, but mucosa seen                            well) and Left Colon = 2 (minor amount of residual                            staining, small fragments of stool and/or opaque                            liquid, but mucosa seen well). The total BBPS score                            equals 6. The ileocecal valve, appendiceal orifice,                            and rectum were photographed. Scope In: 11:59:08 AM Scope Out: 12:09:31 PM Scope Withdrawal Time: 0 hours 8 minutes 5 seconds  Total Procedure Duration: 0 hours 10 minutes 23 seconds  Findings:      Scattered diverticula were found in the left colon.      There was a medium-sized lipoma, in the transverse colon.      Non-bleeding external and internal hemorrhoids were found during       retroflexion. The hemorrhoids were small. Impression:               - Diverticulosis in the left colon.                           - Medium-sized lipoma in the  transverse colon.                           - Non-bleeding external and internal hemorrhoids.                           - No specimens collected. Moderate Sedation:      Per Anesthesia Care Recommendation:           - Patient has a contact number available for                            emergencies. The signs and symptoms of potential                            delayed complications were discussed with the                            patient. Return to normal activities tomorrow.                            Written discharge instructions were provided to the                            patient.                           - Resume previous diet.                           -  Continue present medications.                           - Await pathology results.                           - Repeat colonoscopy in 5 years for surveillance. Procedure Code(s):        --- Professional ---                           [id number], Colonoscopy, flexible; diagnostic, including                            collection of specimen(s) by brushing or washing,                            when performed (separate procedure) Diagnosis Code(s):        --- Professional ---                           K64.8, Other hemorrhoids                           D17.5, Benign lipomatous neoplasm of                            intra-abdominal organs                           D62, Acute posthemorrhagic anemia                           D50.9, Iron deficiency anemia, unspecified                           K57.30, Diverticulosis of large intestine without                            perforation or abscess without bleeding CPT copyright 2022 American Medical Association. All rights reserved. The codes documented in this report are preliminary and upon coder review may  be revised to meet current compliance requirements. Sanjuan Dame, MD Sanjuan Dame, MD 12/27/2022 12:13:11 PM This report has been signed electronically. Number of Addenda: 0

## 2022-12-27 NOTE — Discharge Summary (Addendum)
Physician Discharge Summary  Wendy Kramer ZOX:[id number] DOB: 1961/03/06 DOA: 12/26/2022  PCP: Benetta Spar, MD  Admit date: 12/26/2022  Discharge date: 12/27/2022  Admitted From:Home  Disposition:  Home  Recommendations for Outpatient Follow-up:  Follow up with PCP in 1-2 weeks Follow-up with GI will be scheduled for repeat EGD in 4-6 weeks. Repeat CBC in 1 week Continue home medications as prior Patient does not tolerate oral iron supplementation very well and would like to revisit this further in the future.  IV iron infusion given prior to discharge  Home Health: None  Equipment/Devices: None  Discharge Condition:Stable  CODE STATUS: Full  Diet recommendation: Heart Healthy/carb modified  Brief/Interim Summary: Wendy Kramer is a 62 y.o. female with medical history significant for GERD, hypertension, prior CVA, NASH cirrhosis, colon polyps, and erosive esophagitis along with hiatal hernia, type 2 diabetes, and osteoarthritis who presented to the ED with worsening fatigue and malaise over the last few weeks.  She was noted to have low hemoglobin levels on lab work outpatient and was admitted with acute anemia with concerns for blood loss along with iron deficiency.  She underwent EGD and colonoscopy today with findings of GAVE and had APC.  This is suspected to be the source of her chronic blood loss and she is recommended to have iron infusion prior to discharge with close follow-up to GI outpatient.  This will be scheduled along with EGD in the next 4-6 weeks.  PRBC transfusion of 2 units was attempted during the course of this admission, however she had a transfusion reaction with a rash develop after half of the first unit was transfused.  At this point, she no longer requires transfusion and will be carefully followed up outpatient.  She improved quicker than expected given expedited treatment.  No other acute events or concerns noted.  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  Associated iron deficiency anemia.  Discharge Instructions  Discharge Instructions     Diet - low sodium heart healthy   Complete by: As directed    Increase activity slowly   Complete by: As directed       Allergies as of 12/27/2022       Reactions   Asa [aspirin] Anaphylaxis   Tolerates other NSAIDS   Augmentin [amoxicillin-pot Clavulanate] Other (See Comments)   "Retching and pass out"   Levaquin [levofloxacin In D5w] Itching, Other (See Comments)   "body turned red"   Lovenox [enoxaparin Sodium] Other (See Comments)   physician instructed not to use ever again. "white veins popped out"        Medication List     STOP taking these medications    ibuprofen 600 MG tablet Commonly known as: ADVIL       TAKE these medications    Accu-Chek Guide Me w/Device Kit 1 Piece by Does not apply route as directed.   Accu-Chek Guide test strip Generic drug: glucose blood USE TO CHECK BLOOD SUGARS ONCE DAILY   Accu-Chek Softclix Lancets lancets Use as instructed to monitor glucose 1 times daily.   acetaminophen 500 MG tablet Commonly known as: TYLENOL Take 500 mg by mouth every  6 (six) hours as needed for moderate pain.   albuterol 108 (90 Base) MCG/ACT inhaler Commonly known as: VENTOLIN HFA Inhale 1 puff into the lungs every 6 (six) hours as needed for wheezing or shortness of breath.   Breo Ellipta 200-25 MCG/ACT Aepb Generic drug: fluticasone furoate-vilanterol Inhale 1 puff into the lungs daily.   fluticasone 50 MCG/ACT nasal spray Commonly known as: FLONASE Place 1 spray into both nostrils daily as  needed for allergies or rhinitis.   lisinopril 20 MG tablet Commonly known as: ZESTRIL Take 10 mg by mouth daily.   metFORMIN 500 MG tablet Commonly known as: GLUCOPHAGE Take 500 mg by mouth 2 (two) times daily with a meal.   One Daily Multivit/Iron-Free Tabs Take 1 tablet by mouth every evening.   RABEprazole 20 MG tablet Commonly known as: ACIPHEX Take 1 tablet (20 mg total) by mouth 2 (two) times daily before a meal.   sodium chloride 0.65 % Soln nasal spray Commonly known as: OCEAN Place 1 spray into both nostrils as needed for congestion.   traMADol 50 MG tablet Commonly known as: ULTRAM Take 50-100 mg by mouth every 8 (eight) hours as needed for moderate pain.        Follow-up Information     [name], Wayland Salinas, MD. Schedule an appointment as soon as possible for a visit in 1 week(s).   Specialty: Internal Medicine Contact information: [address] [place] [address] [phone]                Allergies  Allergen Reactions   Asa [Aspirin] Anaphylaxis    Tolerates other NSAIDS   Augmentin [Amoxicillin-Pot Clavulanate] Other (See Comments)    "Retching and pass out"   Levaquin [Levofloxacin In D5w] Itching and Other (See Comments)    "body turned red"   Lovenox [Enoxaparin Sodium] Other (See Comments)    physician instructed not to use ever again. "white veins popped out"    Consultations: GI   Procedures/Studies: No results found.   Discharge Exam: Vitals:   12/27/22 1230 12/27/22 1253  BP: (!) 168/72 (!) 142/85  Pulse: 81 77  Resp: (!) 26 20  Temp:  98.1 F (36.7 C)  SpO2: 96% 98%   Vitals:   12/27/22 1225 12/27/22 1227 12/27/22 1230 12/27/22 1253  BP:   (!) 168/72 (!) 142/85  Pulse: 87 85 81 77  Resp: 20 16 (!) 26 20  Temp:    98.1 F (36.7 C)  TempSrc:    Oral  SpO2: 96% 97% 96% 98%  Weight:      Height:        General: Pt is alert, awake, not in acute distress Cardiovascular: RRR, S1/S2 +, no rubs,  no gallops Respiratory: CTA bilaterally, no wheezing, no rhonchi Abdominal: Soft, NT, ND, bowel sounds + Extremities: no edema, no cyanosis    The results of significant diagnostics from this hospitalization (including imaging, microbiology, ancillary and laboratory) are listed below for reference.     Microbiology: No results found for this or any previous visit (from the past 240 hour(s)).   Labs: BNP (last 3 results) No results for input(s): "BNP" in the last 8760 hours. Basic Metabolic Panel: Recent Labs  Lab 12/25/22 0759 12/27/22 0445  NA 139 137  K 4.2 3.5  CL 104 104  CO2 25 24  GLUCOSE 187* 124*  BUN 14 9  CREATININE 0.67 0.75  CALCIUM 9.0 8.1*  MG 1.6 1.6*   Liver Function Tests: Recent  Labs  Lab 12/25/22 0759  AST 26  ALT 14  BILITOT 0.3  PROT 6.3   No results for input(s): "LIPASE", "AMYLASE" in the last 168 hours. No results for input(s): "AMMONIA" in the last 168 hours. CBC: Recent Labs  Lab 12/25/22 0759 12/26/22 1121 12/26/22 1602  WBC 7.0 6.7  --   HGB 6.4* 6.5* 7.7*  HCT 24.8* 24.9* 28.1*  MCV 68.3* 68.6*  --   PLT 200 194  --    Cardiac Enzymes: No results for input(s): "CKTOTAL", "CKMB", "CKMBINDEX", "TROPONINI" in the last 168 hours. BNP: Invalid input(s): "POCBNP" CBG: Recent Labs  Lab 12/26/22 2344 12/27/22 0405 12/27/22 0820 12/27/22 1042 12/27/22 1332  GLUCAP 136* 128* 143* 135* 151*   D-Dimer No results for input(s): "DDIMER" in the last 72 hours. Hgb A1c Recent Labs    12/26/22 1121  HGBA1C 9.4*   Lipid Profile No results for input(s): "CHOL", "HDL", "LDLCALC", "TRIG", "CHOLHDL", "LDLDIRECT" in the last 72 hours. Thyroid function studies No results for input(s): "TSH", "T4TOTAL", "T3FREE", "THYROIDAB" in the last 72 hours.  Invalid input(s): "FREET3" Anemia work up Recent Labs    12/25/22 0759 12/26/22 1125  VITAMINB12  --  737  FOLATE  --  24.3  FERRITIN 3* 3*  TIBC 479* 530*  IRON 12* 12*   RETICCTPCT  --  1.6   Urinalysis    Component Value Date/Time   COLORURINE YELLOW 10/21/2017 2051   APPEARANCEUR HAZY (A) 10/21/2017 2051   LABSPEC 1.019 10/21/2017 2051   PHURINE 5.0 10/21/2017 2051   GLUCOSEU NEGATIVE 10/21/2017 2051   HGBUR SMALL (A) 10/21/2017 2051   BILIRUBINUR NEGATIVE 10/21/2017 2051   KETONESUR NEGATIVE 10/21/2017 2051   PROTEINUR NEGATIVE 10/21/2017 2051   NITRITE NEGATIVE 10/21/2017 2051   LEUKOCYTESUR NEGATIVE 10/21/2017 2051   Sepsis Labs Recent Labs  Lab 12/25/22 0759 12/26/22 1121  WBC 7.0 6.7   Microbiology No results found for this or any previous visit (from the past 240 hour(s)).   Time coordinating discharge: 35 minutes  SIGNED:   Erick Blinks, DO [facility] 12/27/2022, 2:42 PM  If 7PM-7AM, please contact night-coverage [email or web address]

## 2022-12-27 NOTE — Telephone Encounter (Signed)
Wendy Kramer -please arrange CBC for the patient for late next week  Wendy Kramer -patient had colonoscopy performed while inpatient as well as EGD. She needs to have repeat EGD in 4 to 6 weeks therefore we can keep her outpatient upper endoscopy with Dr. Jena Gauss on 11/13 but cancel the colonoscopy.  Wendy Kramer - please call patient to schedule follow-up with me in early December (for follow-up after EGD in November)  Brooke Bonito, MSN, APRN, FNP-BC, AGACNP-BC [facility] at [facility]

## 2022-12-27 NOTE — Progress Notes (Signed)
Patient underwent EGD with control of bleeding and Colonoscopy under propofol sedation.  Tolerated the procedure adequately.   FINDINGS:  EGD: - Portal hypertensive gastropathy.  - Gastric antral vascular ectasia with bleeding. Treated with argon plasma coagulation ( APC) . Likely cause of iron deficiency anemia - -Normal duodenal bulb and second portion of the duodenum.  Colonoscopy: Diverticulosis in the left colon. - Medium- sized lipoma in the transverse colon. - Non- bleeding external and internal hemorrhoids. - No specimens collected.  RECOMMENDATIONS  -PPI daily Iron supplementation  -Non- selective Betablockers if patiet can tolerate  -Repeat EGD in 4- 6 weeks to assess GAVE and need for repeat intervention - Resume previous diet.  - Continue present medications.   - Repeat colonoscopy in 5 years for surveillance. -Please re-call GI with any further questions -Patient to follow up with GI in the clinic   Vista Lawman, MD Gastroenterology and Hepatology [facility]astro[facility]

## 2022-12-27 NOTE — Interval H&P Note (Signed)
History and Physical Interval Note:  12/27/2022 10:54 AM  Wendy Kramer  has presented today for surgery, with the diagnosis of iron deficiency anemia.  The various methods of treatment have been discussed with the patient and family. After consideration of risks, benefits and other options for treatment, the patient has consented to  Procedure(s): COLONOSCOPY WITH PROPOFOL (N/A) ESOPHAGOGASTRODUODENOSCOPY (EGD) WITH PROPOFOL (N/A) as a surgical intervention.  The patient's history has been reviewed, patient examined, no change in status, stable for surgery.  I have reviewed the patient's chart and labs.  Questions were answered to the patient's satisfaction.     Juanetta Beets Lauren Aguayo

## 2022-12-27 NOTE — Op Note (Signed)
[facility] Patient Name: Wendy Kramer Procedure Date: 12/27/2022 11:28 AM MRN: [id number] Date of Birth: 28-Sep-1960 Attending MD: Sanjuan Dame , MD, [id number] CSN: [id number] Age: 62 Admit Type: Outpatient Procedure:                Upper GI endoscopy Indications:              Acute post hemorrhagic anemia, Iron deficiency                            anemia secondary to chronic blood loss Providers:                Sanjuan Dame, MD, Buel Ream. Thomasena Edis RN, RN,                            Lennice Sites Technician, Pensions consultant Referring MD:              Medicines:                Monitored Anesthesia Care Complications:            No immediate complications. Estimated Blood Loss:     Estimated blood loss: none. Estimated blood loss                            was minimal. Procedure:                Pre-Anesthesia Assessment:                           - Prior to the procedure, a History and Physical                            was performed, and patient medications and                            allergies were reviewed. The patient's tolerance of                            previous anesthesia was also reviewed. The risks                            and benefits of the procedure and the sedation                            options and risks were discussed with the patient.                            All questions were answered, and informed consent                            was obtained. Prior Anticoagulants: The patient has                            taken no anticoagulant or antiplatelet agents. ASA  Grade Assessment: III - A patient with severe                            systemic disease. After reviewing the risks and                            benefits, the patient was deemed in satisfactory                            condition to undergo the procedure.                           After obtaining informed consent, the endoscope was                             passed under direct vision. Throughout the                            procedure, the patient's blood pressure, pulse, and                            oxygen saturations were monitored continuously. The                            [id number] ([id number] scope was introduced through the                            mouth, and advanced to the second part of duodenum.                            The upper GI endoscopy was accomplished without                            difficulty. The patient tolerated the procedure                            well. Scope In: 11:44:25 AM Scope Out: 11:54:18 AM Total Procedure Duration: 0 hours 9 minutes 53 seconds  Findings:      There is no endoscopic evidence of varices in the entire esophagus.      Mild portal hypertensive gastropathy was found in the gastric fundus and       in the gastric body.      Moderate gastric antral vascular ectasia with bleeding was present in       the gastric antrum. Coagulation for hemostasis using argon plasma at 0.3       liters/minute and 20 watts was successful.      The duodenal bulb and second portion of the duodenum were normal. Impression:               - Portal hypertensive gastropathy.                           - Gastric antral vascular ectasia with bleeding.  Treated with argon plasma coagulation (APC). Likely                            cause of iron deficiency anemia                           - Normal duodenal bulb and second portion of the                            duodenum.                           - No specimens collected. Moderate Sedation:      Per Anesthesia Care Recommendation:           PPI daily                           Iron supplementation                           Non-selective Betablockers if patiet can tolerate                           Repeat EGD in 4-6 weeks to assess GAVE and need for                            repeat intervention Procedure Code(s):        --- Professional  ---                           [phone], Esophagogastroduodenoscopy, flexible,                            transoral; with control of bleeding, any method Diagnosis Code(s):        --- Professional ---                           K76.6, Portal hypertension                           K31.89, Other diseases of stomach and duodenum                           K31.811, Angiodysplasia of stomach and duodenum                            with bleeding                           D62, Acute posthemorrhagic anemia                           D50.0, Iron deficiency anemia secondary to blood                            loss (chronic) CPT copyright 2022 American Medical Association. All rights reserved. The codes documented in this report are  preliminary and upon coder review may  be revised to meet current compliance requirements. Sanjuan Dame, MD Sanjuan Dame, MD 12/27/2022 12:10:41 PM This report has been signed electronically. Number of Addenda: 0

## 2022-12-27 NOTE — Transfer of Care (Addendum)
Immediate Anesthesia Transfer of Care Note  Patient: [name]  Procedure(s) Performed: COLONOSCOPY WITH PROPOFOL ESOPHAGOGASTRODUODENOSCOPY (EGD) WITH PROPOFOL HOT HEMOSTASIS (ARGON PLASMA COAGULATION/BICAP)  Patient Location: Short Stay  Anesthesia Type:General  Level of Consciousness: drowsy and patient cooperative  Airway & Oxygen Therapy: Patient Spontanous Breathing and Patient connected to nasal cannula oxygen  Post-op Assessment: Report given to RN and Post -op Vital signs reviewed and stable  Post vital signs: Reviewed and stable  Last Vitals:  Vitals Value Taken Time  BP 139/61 12/27/22   1215  Temp 36.4 12/27/22   1215  Pulse 88 12/27/22   1215  Resp 20 12/27/22   1215  SpO2 94% 009/27/24 1215    Last Pain:  Vitals:   12/27/22 1135  TempSrc:   PainSc: 0-No pain         Complications: No notable events documented.

## 2022-12-27 NOTE — Progress Notes (Signed)
Pt returned to room via WC from Endo s/p EGD and colonoscopy. A&O, only complaint is left mid back pain from lying on procedure table. Breath sounds clear, heart rate with RRR and murmur noted. Abd soft with bowel sounds x4. Pt drinking soda provided in Endo, denies any c/o abd pain or n/v. Tele monitor reapplied. Call bell within reach, advised to call for needs.

## 2022-12-27 NOTE — Progress Notes (Signed)
Pt transported to Endo via WC.

## 2022-12-27 NOTE — Progress Notes (Signed)
   12/27/22 0615  Stool Characteristics  Bowel Incontinence No  Stool Type Type 7 (Liquid consistency with no solid pieces)  Stool Descriptors  (Tap water enema given. Stool is clear.)

## 2022-12-28 LAB — BASIC METABOLIC PANEL
CO2: 24 — AB (ref 13–22)
Chloride: 105 (ref 99–108)
Potassium: 4.2 meq/L (ref 3.5–5.1)
Sodium: 139 (ref 137–147)

## 2022-12-28 LAB — LIPID PANEL
A1c: 9.8
Albumin, Urine POC: 0.8
Albumin/Creatinine Ratio, Urine, POC: 7
Cholesterol: 140 (ref 0–200)
Creatinine, POC: 113 mg/dL
EGFR: 95
HDL: 49 (ref 35–70)
LDL Cholesterol: 72
LDl/HDL Ratio: 29
Triglycerides: 107 (ref 40–160)

## 2022-12-28 LAB — HEPATIC FUNCTION PANEL
ALT: 15 U/L (ref 7–35)
AST: 26 (ref 13–35)
Alkaline Phosphatase: 88 (ref 25–125)
Bilirubin, Direct: 0.1
Bilirubin, Total: 1.5

## 2022-12-28 LAB — COMPREHENSIVE METABOLIC PANEL
Albumin: 3.7 (ref 3.5–5.0)
Calcium: 8.9 (ref 8.7–10.7)
Globulin: 2.4

## 2022-12-29 ENCOUNTER — Emergency Department (HOSPITAL_COMMUNITY)
Admission: EM | Admit: 2022-12-29 | Discharge: 2022-12-30 | Disposition: A | Discharge status: Home / Self Care | Payer: Medicare Other | Attending: Emergency Medicine | Admitting: Emergency Medicine

## 2022-12-29 ENCOUNTER — Encounter (HOSPITAL_COMMUNITY): Payer: Self-pay

## 2022-12-29 ENCOUNTER — Other Ambulatory Visit: Payer: Self-pay

## 2022-12-29 DIAGNOSIS — R531 Weakness: Secondary | ICD-10-CM | POA: Diagnosis not present

## 2022-12-29 DIAGNOSIS — I959 Hypotension, unspecified: Secondary | ICD-10-CM | POA: Diagnosis not present

## 2022-12-29 DIAGNOSIS — R58 Hemorrhage, not elsewhere classified: Secondary | ICD-10-CM | POA: Diagnosis not present

## 2022-12-29 LAB — URINALYSIS, ROUTINE W REFLEX MICROSCOPIC
Bilirubin Urine: NEGATIVE
Glucose, UA: NEGATIVE mg/dL
Hgb urine dipstick: NEGATIVE
Ketones, ur: NEGATIVE mg/dL
Leukocytes,Ua: NEGATIVE
Nitrite: NEGATIVE
Protein, ur: NEGATIVE mg/dL
Specific Gravity, Urine: 1.003 — ABNORMAL LOW (ref 1.005–1.030)
pH: 6 (ref 5.0–8.0)

## 2022-12-29 LAB — CBC WITH DIFFERENTIAL/PLATELET
Abs Immature Granulocytes: 0.05 10*3/uL (ref 0.00–0.07)
Basophils Absolute: 0.1 10*3/uL (ref 0.0–0.1)
Basophils Relative: 1 %
Eosinophils Absolute: 0.5 10*3/uL (ref 0.0–0.5)
Eosinophils Relative: 5 %
HCT: 26.7 % — ABNORMAL LOW (ref 36.0–46.0)
Hemoglobin: 7.1 g/dL — ABNORMAL LOW (ref 12.0–15.0)
Immature Granulocytes: 1 %
Lymphocytes Relative: 25 %
Lymphs Abs: 2.4 10*3/uL (ref 0.7–4.0)
MCH: 18.5 pg — ABNORMAL LOW (ref 26.0–34.0)
MCHC: 26.6 g/dL — ABNORMAL LOW (ref 30.0–36.0)
MCV: 69.7 fL — ABNORMAL LOW (ref 80.0–100.0)
Monocytes Absolute: 0.7 10*3/uL (ref 0.1–1.0)
Monocytes Relative: 7 %
Neutro Abs: 5.9 10*3/uL (ref 1.7–7.7)
Neutrophils Relative %: 61 %
Platelets: 209 10*3/uL (ref 150–400)
RBC: 3.83 MIL/uL — ABNORMAL LOW (ref 3.87–5.11)
RDW: 20.9 % — ABNORMAL HIGH (ref 11.5–15.5)
WBC: 9.6 10*3/uL (ref 4.0–10.5)
nRBC: 0 % (ref 0.0–0.2)

## 2022-12-29 LAB — COMPREHENSIVE METABOLIC PANEL
ALT: 18 U/L (ref 0–44)
AST: 35 U/L (ref 15–41)
Albumin: 3.4 g/dL — ABNORMAL LOW (ref 3.5–5.0)
Alkaline Phosphatase: 74 U/L (ref 38–126)
Anion gap: 7 (ref 5–15)
BUN: 12 mg/dL (ref 8–23)
CO2: 25 mmol/L (ref 22–32)
Calcium: 8.5 mg/dL — ABNORMAL LOW (ref 8.9–10.3)
Chloride: 104 mmol/L (ref 98–111)
Creatinine, Ser: 0.74 mg/dL (ref 0.44–1.00)
GFR, Estimated: >60 mL/min (ref >60)
Glucose, Bld: 148 mg/dL — ABNORMAL HIGH (ref 70–99)
Potassium: 3.6 mmol/L (ref 3.5–5.1)
Sodium: 136 mmol/L (ref 135–145)
Total Bilirubin: 0.3 mg/dL (ref 0.3–1.2)
Total Protein: 6.8 g/dL (ref 6.5–8.1)

## 2022-12-29 LAB — TYPE AND SCREEN
ABO/RH(D): O POS
Antibody Screen: NEGATIVE

## 2022-12-29 LAB — CBG MONITORING, ED: Glucose-Capillary: 118 mg/dL — ABNORMAL HIGH (ref 70–99)

## 2022-12-29 NOTE — ED Triage Notes (Addendum)
Pt bib [facility] from home for generalized weakness and "shaky legs" that started today. Pt was ambulatory to EMS truck, recently released from this hospital for GI bleed. Denies dark stools or vomiting. Pt also reports having anxiety

## 2022-12-29 NOTE — ED Notes (Signed)
Pt able to ambulate without difficulty - around nurses station x2. Steady gait NAD

## 2022-12-29 NOTE — Discharge Instructions (Signed)

## 2022-12-29 NOTE — ED Provider Notes (Signed)
[name]   CSN: [id number] Arrival date & time: 12/29/22  5621     History  Chief Complaint  Patient presents with   Weakness    Wendy Kramer is a 62 y.o. female.  The history is provided by the patient.  Patient presents for generalized weakness.  Patient reports for about an 1-2 hrs she had generalized weakness and wobbly legs.  She drank water and felt improved.  No syncope.  She just got discharge from the hospital reports she may have overdid it yesterday.  She is also had very little p.o. intake.  No fevers or vomiting.  No headache.  No focal weakness.  No chest pain or shortness of breath.  No bloody or black stools.     Home Medications Prior to Admission medications   Medication Sig Start Date End Date Taking? Authorizing Provider  Accu-Chek Softclix Lancets lancets Use as instructed to monitor glucose 1 times daily. 11/06/22   Dani Gobble, NP  acetaminophen (TYLENOL) 500 MG tablet Take 500 mg by mouth every 6 (six) hours as needed for moderate pain.    [provider]  albuterol (PROVENTIL HFA;VENTOLIN HFA) 108 (90 Base) MCG/ACT inhaler Inhale 1 puff into the lungs every 6 (six) hours as needed for wheezing or shortness of breath.    [provider]  Blood Glucose Monitoring Suppl (ACCU-CHEK GUIDE ME) w/Device KIT 1 Piece by Does not apply route as directed. 11/02/19   Roma Kayser, MD  BREO ELLIPTA 200-25 MCG/ACT AEPB Inhale 1 puff into the lungs daily. 12/04/22   [provider]  fluticasone (FLONASE) 50 MCG/ACT nasal spray Place 1 spray into both nostrils daily as needed for allergies or rhinitis.     [provider]  glucose blood (ACCU-CHEK GUIDE) test strip USE TO CHECK BLOOD SUGARS ONCE DAILY 12/09/22   Dani Gobble, NP  lisinopril (ZESTRIL) 20 MG tablet Take 10 mg by mouth daily.    [provider]  metFORMIN (GLUCOPHAGE) 500 MG tablet Take  500 mg by mouth 2 (two) times daily with a meal.    [provider]  Multiple Vitamins-Minerals (ONE DAILY MULTIVIT/IRON-FREE) TABS Take 1 tablet by mouth every evening.    [provider]  RABEprazole (ACIPHEX) 20 MG tablet Take 1 tablet (20 mg total) by mouth 2 (two) times daily before a meal. 12/23/22   Mahon, Frederik Schmidt, NP  sodium chloride (OCEAN) 0.65 % SOLN nasal spray Place 1 spray into both nostrils as needed for congestion.    [provider]  traMADol (ULTRAM) 50 MG tablet Take 50-100 mg by mouth every 8 (eight) hours as needed for moderate pain. 09/20/16   [provider]      Allergies    Asa [aspirin], Augmentin [amoxicillin-pot clavulanate], Levaquin [levofloxacin in d5w], and Lovenox [enoxaparin sodium]    Review of Systems   Review of Systems  Constitutional:  Positive for fatigue. Negative for fever.  Respiratory:  Negative for shortness of breath.   Cardiovascular:  Negative for chest pain.  Gastrointestinal:  Negative for blood in stool.  Neurological:  Negative for syncope.    Physical Exam Updated Vital Signs BP (!) 165/72   Pulse 79   Temp 98 F (36.7 C) (Oral)   Resp 17   Ht 1.6 m (5\' 3" )   Wt 105.7 kg   LMP 09/06/2016   SpO2 99%   BMI 41.28 kg/m  Physical Exam CONSTITUTIONAL:  Well developed/well nourished HEAD: Normocephalic/atraumatic EYES: EOMI/PERRL, conjunctival pink ENMT: Mucous membranes moist NECK: supple no meningeal signs SPINE/BACK:entire spine nontender CV: S1/S2 noted, no murmurs/rubs/gallops noted LUNGS: Lungs are clear to auscultation bilaterally, no apparent distress ABDOMEN: soft, nontender NEURO: Pt is awake/alert/appropriate, moves all extremitiesx4.  No facial droop.  No arm or leg drift EXTREMITIES: pulses normal/equal, full ROM SKIN: warm, color normal PSYCH: no abnormalities of mood noted, alert and oriented to situation  ED Results / Procedures / Treatments   Labs (all labs ordered are  listed, but only abnormal results are displayed) Labs Reviewed  CBC WITH DIFFERENTIAL/PLATELET - Abnormal; Notable for the following components:      Result Value   RBC 3.83 (*)    Hemoglobin 7.1 (*)    HCT 26.7 (*)    MCV 69.7 (*)    MCH 18.5 (*)    MCHC 26.6 (*)    RDW 20.9 (*)    All other components within normal limits  COMPREHENSIVE METABOLIC PANEL - Abnormal; Notable for the following components:   Glucose, Bld 148 (*)    Calcium 8.5 (*)    Albumin 3.4 (*)    All other components within normal limits  URINALYSIS, ROUTINE W REFLEX MICROSCOPIC - Abnormal; Notable for the following components:   Color, Urine STRAW (*)    Specific Gravity, Urine 1.003 (*)    All other components within normal limits  CBG MONITORING, ED - Abnormal; Notable for the following components:   Glucose-Capillary 118 (*)    All other components within normal limits  TYPE AND SCREEN    EKG EKG Interpretation Date/Time:  Sunday December 29 2022 21:52:28 EDT Ventricular Rate:  76 PR Interval:  161 QRS Duration:  87 QT Interval:  398 QTC Calculation: 448 R Axis:   75  Text Interpretation: Sinus rhythm Interpretation limited secondary to artifact Confirmed by Zadie Rhine ([id number]) on 12/29/2022 11:04:35 PM  Radiology No results found.  Procedures Procedures    Medications Ordered in ED Medications - No data to display  ED Course/ Medical Decision Making/ A&P                                 Medical Decision Making Amount and/or Complexity of Data Reviewed Labs: ordered.   This patient presents to the ED for concern of weakness, this involves an extensive number of treatment options, and is a complaint that carries with it a high risk of complications and morbidity.  The differential diagnosis includes but is not limited to CVA, intracranial hemorrhage, acute coronary syndrome, renal failure, urinary tract infection, electrolyte disturbance, pneumonia Anemia  Comorbidities that  complicate the patient evaluation: Patient's presentation is complicated by their history of recent anemia  Additional history obtained: Records reviewed previous admission documents  Lab Tests: I Ordered, and personally interpreted labs.  The pertinent results include: Chronic anemia  Complexity of problems addressed: Patient's presentation is most consistent with  acute presentation with potential threat to life or bodily function  Disposition: After consideration of the diagnostic results and the patient's response to treatment,  I feel that the patent would benefit from discharge   .    Patient very well-appearing.  She had an episode of generalized weakness without syncope or focal weakness.  She is now back to baseline.  She had a normal bowel movement without any bloody or black stools.  Recent admission for blood loss anemia, currently her  hemoglobin is stable. She walked around the ER twice without any difficulty. She is safe for discharge home.       Final Clinical Impression(s) / ED Diagnoses Final diagnoses:  Weakness    Rx / DC Orders ED Discharge Orders     None         Zadie Rhine, MD 12/29/22 2356

## 2022-12-30 ENCOUNTER — Encounter (HOSPITAL_COMMUNITY): Payer: Self-pay | Admitting: Gastroenterology

## 2022-12-30 ENCOUNTER — Other Ambulatory Visit: Payer: Self-pay | Admitting: *Deleted

## 2022-12-30 ENCOUNTER — Encounter: Payer: Self-pay | Admitting: *Deleted

## 2022-12-30 DIAGNOSIS — K7460 Unspecified cirrhosis of liver: Secondary | ICD-10-CM

## 2022-12-30 DIAGNOSIS — D649 Anemia, unspecified: Secondary | ICD-10-CM

## 2022-12-30 LAB — TYPE AND SCREEN
ABO/RH(D): O POS
Antibody Screen: NEGATIVE

## 2022-12-30 NOTE — Anesthesia Postprocedure Evaluation (Signed)
Anesthesia Post Note  Patient: Wendy Kramer  Procedure(s) Performed: COLONOSCOPY WITH PROPOFOL ESOPHAGOGASTRODUODENOSCOPY (EGD) WITH PROPOFOL HOT HEMOSTASIS (ARGON PLASMA COAGULATION/BICAP)  Patient location during evaluation: Phase II Anesthesia Type: General Level of consciousness: awake Pain management: pain level controlled Vital Signs Assessment: post-procedure vital signs reviewed and stable Respiratory status: spontaneous breathing and respiratory function stable Cardiovascular status: blood pressure returned to baseline and stable Postop Assessment: no headache and no apparent nausea or vomiting Anesthetic complications: no Comments: Late entry   No notable events documented.   Last Vitals:  Vitals:   12/27/22 1615 12/27/22 1615  BP: (!) 157/51 (!) 157/51  Pulse: 93 93  Resp:  18  Temp: 36.6 C 36.6 C  SpO2: 94% 94%    Last Pain:  Vitals:   12/27/22 1615  TempSrc: Oral  PainSc:                  Windell Norfolk

## 2022-12-30 NOTE — Telephone Encounter (Signed)
Message sent to endo making aware. New instructions sent for EGD.

## 2022-12-31 ENCOUNTER — Other Ambulatory Visit: Payer: Self-pay | Admitting: Gastroenterology

## 2022-12-31 ENCOUNTER — Ambulatory Visit (HOSPITAL_COMMUNITY)
Admission: RE | Admit: 2022-12-31 | Discharge: 2022-12-31 | Disposition: A | Discharge status: Home / Self Care | Payer: Medicare Other | Source: Ambulatory Visit | Attending: Gastroenterology | Admitting: Gastroenterology

## 2022-12-31 DIAGNOSIS — K7460 Unspecified cirrhosis of liver: Secondary | ICD-10-CM | POA: Insufficient documentation

## 2022-12-31 DIAGNOSIS — Z9049 Acquired absence of other specified parts of digestive tract: Secondary | ICD-10-CM | POA: Diagnosis not present

## 2022-12-31 DIAGNOSIS — D509 Iron deficiency anemia, unspecified: Secondary | ICD-10-CM

## 2022-12-31 DIAGNOSIS — K7689 Other specified diseases of liver: Secondary | ICD-10-CM | POA: Diagnosis not present

## 2023-01-01 ENCOUNTER — Ambulatory Visit (HOSPITAL_COMMUNITY): Payer: Medicare Other

## 2023-01-01 ENCOUNTER — Other Ambulatory Visit: Payer: Self-pay | Admitting: *Deleted

## 2023-01-01 ENCOUNTER — Encounter: Payer: Self-pay | Admitting: *Deleted

## 2023-01-01 DIAGNOSIS — E1165 Type 2 diabetes mellitus with hyperglycemia: Secondary | ICD-10-CM

## 2023-01-01 NOTE — Progress Notes (Signed)
T4 free and TSH has been ordered for the patient ,she was made aware and will go to [facility] on 01/02/2023.

## 2023-01-01 NOTE — Progress Notes (Signed)
[facility] [address] [place] [address]   Clinic Day:  01/02/2023  Referring physician: Benetta Spar*  Patient Care Team: Benetta Spar, MD as PCP - General (Internal Medicine) Jena Gauss, [name], MD as Consulting Physician (Gastroenterology) Gwenith Daily, RN as [facility] Care Management   ASSESSMENT & PLAN:   Assessment:  1.  Severe iron deficiency anemia from blood loss: - CBC on 12/26/2022 with Hb-6.5, MCV-68.  Ferritin was 3 and percent saturation 2. - She received half unit of PRBC and developed hives.  Transfusion was abandoned.  She received Venofer 1 dose 100 mg. - Colonoscopy (12/27/2022): Diverticulosis of the left colon, nonbleeding internal and external hemorrhoids. - EGD (12/27/2022): Portal hypertensive gastropathy, GAVE with bleeding.  Treated with APC.  Normal duodenal bulb and second part of duodenum.  2. Social/Family History: -Lives at home with daughter and 3 grandchildren. Retired with disability, used to be a Energy manager. Tobacco use of 0.5 ppd since age 27.  -No family history if anemia. Maternal aunt had breast cancer. Maternal great-grandfather had colon cancer.   Plan:  1.  Severe iron deficiency anemia from blood loss: - Will repeat her CBC today, check for other nutritional deficiencies, hemolysis and bone marrow infiltrative process. - We talked about parenteral iron therapy with Monoferric 1 g IV x 1.  We discussed side effects including anaphylactic reaction. - We will reevaluate her with CBC in 4 weeks.   Orders Placed This Encounter  Procedures   CBC with Differential    Standing Status:   Future    Number of Occurrences:   1    Standing Expiration Date:   01/02/2024   Lactate dehydrogenase    Standing Status:   Future    Number of Occurrences:   1    Standing Expiration Date:   01/02/2024   Methylmalonic acid, serum    Standing Status:   Future    Number of  Occurrences:   1    Standing Expiration Date:   01/02/2024   Reticulocytes    Standing Status:   Future    Number of Occurrences:   1    Standing Expiration Date:   01/02/2024   Copper, serum    Standing Status:   Future    Number of Occurrences:   1    Standing Expiration Date:   01/02/2024   Immunofixation electrophoresis    Standing Status:   Future    Number of Occurrences:   1    Standing Expiration Date:   01/02/2024   Protein electrophoresis, serum    Standing Status:   Future    Number of Occurrences:   1    Standing Expiration Date:   01/02/2024   Kappa/lambda light chains    Standing Status:   Future    Number of Occurrences:   1    Standing Expiration Date:   01/02/2024   CBC    Standing Status:   Future    Standing Expiration Date:   01/02/2024   Direct antiglobulin test    Standing Status:   Future    Number of Occurrences:   1    Standing Expiration Date:   01/02/2024      Mikeal Hawthorne R Teague,acting as a scribe for Doreatha Massed, MD.,have documented all relevant documentation on the behalf of Doreatha Massed, MD,as directed by  Doreatha Massed, MD while in the presence of Doreatha Massed, MD.  I, Doreatha Massed MD, have reviewed the above documentation for accuracy and completeness, and I agree with the above.   Doreatha Massed, MD   10/3/20244:48 PM  CHIEF COMPLAINT/PURPOSE OF CONSULT:   Diagnosis: Severe iron deficiency anemia  Current Therapy: Monoferric  HISTORY OF PRESENT ILLNESS:   Wendy Kramer is a 62 y.o. female presenting to clinic today for evaluation of anemia at the request of [name], [name], MD.  Today, she states that she is doing well overall. Her appetite level is at 75%. Her energy level is at 75%.  Patient presented to the ED on 12/29/22 for generalized weakness. She also was admitted to the ED on 12/26/22 for acute blood loss anemia. She underwent a colonoscopy and EGD on 12/27/22 while hospitalized with findings of  GAVE and APC. There was hemorrhaging in the colon that was cauterized. She had a rash develop and became tachycardic while having 2 units of PRBC transfused and had to stop receiving transfusion after half of 1 unit. She then had IV iron and notes she felt a burning sensation at the IV site. IV site is sore today. Of note, she does not tolerate iron supplements and would like to receive IV iron when necessary.   She was found to have abnormal CBC from 12/29/22 with low RBC at 3.83, low HGB at 7.1, low HCT at 26.7, low MCV at 69.7, low MCH at 18.5, and elevated RDW at 20.9.   She reports she may have had bleeding issues before coming to the ED as labs showed she had anemia 3 months ago. Anemia was thought to be caused from a GI bleed and she went to see a gastroenterologist. She denies any melena or BRBPR. She denies any previous transfusions. She reports ice pica and occasional lightheadedness. She denies any chest pain. She does not tolerate iron pills well, and report they cause her to have stomach issues. She used to be on Coumadin for PFO and took iron pills at that time from heavy menstrual cycles in the past that she states do not bother her, unlike the ones she received while hospitalized.   She reports arthritis that she treats with Motrin 2-3x a day. She notes she developed stomach issues a few months ago that she contributes to long-term Motrin use and discontinued Motrin a few weeks ago.   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   Unspecified asthma with (acute) exacerbation    Vitamin D deficiency     Surgical History: Past Surgical History:  Procedure Laterality Date   BIOPSY  03/05/2018   Procedure: BIOPSY;  Surgeon: Corbin Ade, MD;  Location: [facility];  Service: Endoscopy;;  gastric   CHOLECYSTECTOMY     COLONOSCOPY WITH PROPOFOL N/A 03/05/2018   Dr. Jena Gauss: Diverticulosis in  the sigmoid and descending colon.  3 colon polyps removed, 2 hyperplastic and one sessile serrated polyp.  Next colonoscopy 3 years.   COLONOSCOPY WITH PROPOFOL N/A 12/27/2022   Procedure: COLONOSCOPY WITH PROPOFOL;  Surgeon: Franky Macho, MD;  Location: [facility];  Service: Endoscopy;  Laterality: N/A;   ESOPHAGOGASTRODUODENOSCOPY (EGD) WITH PROPOFOL N/A 03/05/2018   Dr. Jena Gauss: Erosive reflux esophagitis, small hiatal hernia, erosive gastropathy with reactive gastropathy on biopsy.  No H. pylori.   ESOPHAGOGASTRODUODENOSCOPY (EGD) WITH PROPOFOL N/A 12/27/2022   Procedure: ESOPHAGOGASTRODUODENOSCOPY (EGD) WITH PROPOFOL;  Surgeon: Franky Macho, MD;  [facility]: [facility];  Service: Endoscopy;  Laterality: N/A;   HERNIA REPAIR     umbilical   HOT HEMOSTASIS  12/27/2022   Procedure: HOT HEMOSTASIS (ARGON PLASMA COAGULATION/BICAP);  Surgeon: Franky Macho, MD;  Location: [facility];  Service: Endoscopy;;   POLYPECTOMY  03/05/2018   Procedure: POLYPECTOMY;  Surgeon: Corbin Ade, MD;  [facility]: [facility];  Service: Endoscopy;;  colon   SHOULDER ARTHROSCOPY Right    TEE WITHOUT CARDIOVERSION  2018   patent foramen ovale   TONSILLECTOMY AND ADENOIDECTOMY     TUBAL LIGATION      Social History: Social History   Socioeconomic History   Marital status: Legally Separated    Spouse name: Not on file   Number of children: Not on file   Years of education: Not on file   Highest education level: Not on file  Occupational  History   Not on file  Tobacco Use   Smoking status: Every Day    Current packs/day: 0.50    Average packs/day: 0.5 packs/day for 40.0 years (20.0 ttl pk-yrs)    Types: Cigarettes   Smokeless tobacco: Never  Vaping Use   Vaping status: Never Used  Substance and Sexual Activity   Alcohol use: No   Drug use: No   Sexual activity: Not Currently    Birth control/protection: Post-menopausal, Surgical    Comment: tubal  Other Topics Concern   Not on file  Social History Narrative   Not on file   Social Determinants of Health   Financial Resource Strain: Medium Risk (06/04/2022)   Overall Financial Resource Strain (CARDIA)    Difficulty of Paying Living Expenses: Somewhat hard  Food Insecurity: No Food Insecurity (12/26/2022)   Hunger Vital Sign    Worried About Running Out of Food in the Last Year: Never true    Ran Out of Food in the Last Year: Never true  Transportation Needs: No Transportation Needs (12/26/2022)   PRAPARE - Administrator, Civil Service (Medical): No    Lack of Transportation (Non-Medical): No  Physical Activity: Insufficiently Active (09/17/2022)   Exercise Vital Sign    Days of Exercise per Week: 3 days    Minutes of Exercise per Session: 30 min  Stress: No Stress Concern Present (06/04/2022)   [employer] of Occupational Health - Occupational Stress Questionnaire    Feeling of Stress : Only a little  Social Connections: Socially Isolated (06/04/2022)   Social Connection and Isolation Panel [NHANES]    Frequency of Communication with Friends and Family: More than three times a week    Frequency of Social Gatherings with Friends and Family: More than three times a week    Attends Religious Services: Never    Database administrator or Organizations: No    Attends Banker Meetings: Never    Marital Status: Separated  Intimate Partner Violence: Not At Risk (12/26/2022)   Humiliation, Afraid, Rape, and Kick questionnaire    Fear of  Current or Ex-Partner: No    Emotionally Abused: No    Physically Abused: No    Sexually Abused: No    Family History: Family History  Problem Relation Age of Onset   Breast cancer Maternal Aunt    Colon cancer Other        maternal great grandfather   Liver disease Brother        Fatty liver   HIV Brother    Hypertension Mother    Hyperlipidemia Mother    Hypertension Father    Thyroid disease Sister    Pancreatic cancer Sister    Healthy Daughter    Healthy Son    Healthy Son    Heart disease Son     Current Medications:  Current Outpatient Medications:    Accu-Chek Softclix Lancets lancets, Use as instructed to monitor glucose 1 times daily., Disp: 100 each, Rfl: 5   acetaminophen (TYLENOL) 500 MG tablet, Take 500 mg by mouth every 6 (six) hours as needed for moderate pain., Disp: , Rfl:    albuterol (PROVENTIL HFA;VENTOLIN HFA) 108 (90 Base) MCG/ACT inhaler, Inhale 1 puff into the lungs every 6 (six) hours as needed for wheezing or shortness of breath., Disp: , Rfl:    Blood Glucose Monitoring Suppl (ACCU-CHEK GUIDE ME) w/Device KIT, 1 Piece by Does not apply route as directed., Disp: 1 kit, Rfl: 0   BREO ELLIPTA 200-25 MCG/ACT AEPB, Inhale 1 puff into the lungs daily., Disp: , Rfl:    fluticasone (FLONASE) 50 MCG/ACT nasal spray, Place 1 spray into both nostrils daily as needed for allergies or rhinitis. , Disp: , Rfl:    glucose blood (ACCU-CHEK GUIDE) test strip, USE TO CHECK BLOOD SUGARS ONCE DAILY, Disp: 100 strip, Rfl: 1   lisinopril (ZESTRIL) 20 MG tablet, Take 10 mg by mouth daily., Disp: , Rfl:    metFORMIN (GLUCOPHAGE) 500 MG tablet, Take 500 mg by mouth 2 (two) times daily with a meal., Disp: , Rfl:    Multiple Vitamins-Minerals (ONE DAILY MULTIVIT/IRON-FREE) TABS, Take 1 tablet by mouth every evening., Disp: , Rfl:    RABEprazole (ACIPHEX) 20 MG tablet, Take 1 tablet (20 mg total) by mouth 2 (two) times daily before a meal., Disp: 180 tablet, Rfl: 3   sodium  chloride (OCEAN) 0.65 % SOLN nasal spray, Place 1 spray into both nostrils as needed for congestion., Disp: , Rfl:    traMADol (ULTRAM) 50 MG tablet, Take 50-100 mg by mouth every 8 (eight) hours as needed for moderate pain., Disp: , Rfl: 3   Allergies: Allergies  Allergen Reactions   Asa [Aspirin] Anaphylaxis    Tolerates other NSAIDS   Augmentin [Amoxicillin-Pot Clavulanate] Other (See Comments)    "Retching and pass out"   Levaquin [Levofloxacin In D5w] Itching and Other (See Comments)    "body turned red"   Lovenox [Enoxaparin Sodium] Other (See Comments)    physician instructed not to use ever again. "white veins popped out"    REVIEW OF SYSTEMS:   Review of Systems  Constitutional:  Negative for chills, fatigue and fever.  HENT:   Negative for lump/mass, mouth sores, nosebleeds, sore throat and trouble swallowing.   Eyes:  Negative for eye problems.  Respiratory:  Negative for cough and shortness of breath.   Cardiovascular:  Positive for palpitations. Negative for chest pain and leg swelling.  Gastrointestinal:  Negative for abdominal pain, constipation, diarrhea, nausea and vomiting.  Genitourinary:  Negative for bladder incontinence, difficulty urinating, dysuria, frequency, hematuria and nocturia.   Musculoskeletal:  Positive for neck pain (6/10 severity). Negative for arthralgias, back pain, flank pain and myalgias.  Skin:  Negative for itching and rash.  Neurological:  Positive for headaches. Negative for dizziness and numbness.  Hematological:  Does not bruise/bleed easily.  Psychiatric/Behavioral:  Negative for depression, sleep disturbance and suicidal ideas. The patient is not nervous/anxious.   All other systems reviewed and are negative.    VITALS:   Blood pressure (!) 146/61, pulse 80, temperature 98.3 F (36.8 C), temperature source Oral, resp. rate 16, height 5\' 3"  (1.6 m), weight 234 lb 3.2 oz (106.2 kg), last menstrual period 09/06/2016, SpO2 97%.  Wt  Readings from Last 3 Encounters:  01/02/23 234 lb 3.2 oz (106.2 kg)  12/29/22 233 lb 0.4 oz (105.7 kg)  12/26/22 233 lb (105.7 kg)    Body mass index is 41.49 kg/m.   PHYSICAL EXAM:   Physical Exam Vitals and nursing note reviewed. Exam conducted with a chaperone present.  Constitutional:      Appearance: Normal appearance.  Cardiovascular:     Rate and Rhythm: Normal rate and regular rhythm.     Pulses: Normal pulses.     Heart sounds: Normal heart sounds.  Pulmonary:     Effort: Pulmonary effort is normal.     Breath sounds: Normal breath sounds.  Abdominal:     Palpations: Abdomen is soft. There is no hepatomegaly, splenomegaly or mass.     Tenderness: There is no abdominal tenderness.  Musculoskeletal:     Right lower leg: No edema.     Left lower leg: No edema.  Lymphadenopathy:     Cervical: No cervical adenopathy.     Right cervical: No superficial, deep or posterior cervical adenopathy.    Left cervical: No superficial, deep or posterior cervical adenopathy.     Upper Body:     Right upper body: No supraclavicular or axillary adenopathy.     Left upper body: No supraclavicular or axillary adenopathy.  Neurological:     General: No focal deficit present.     Mental Status: She is alert and oriented to person, place, and time.  Psychiatric:        Mood and Affect: Mood normal.        Behavior: Behavior normal.     LABS:      Latest Ref Rng & Units 01/02/2023    8:36 AM 12/29/2022    7:47 PM 12/26/2022    4:02 PM  CBC  WBC 4.0 - 10.5 K/uL 6.4  9.6    Hemoglobin 12.0 - 15.0 g/dL 7.3  7.1  7.7   Hematocrit 36.0 - 46.0 % 28.8  26.7  28.1   Platelets 150 - 400 K/uL 184  209        Latest Ref Rng & Units 12/29/2022    7:47 PM 12/28/2022   12:00 AM 12/27/2022    4:45 AM  CMP  Glucose 70 - 99 mg/dL 409   811   BUN 8 - 23 mg/dL 12   9   Creatinine 9.14 - 1.00 mg/dL 7.82   9.56   Sodium 213 - 145 mmol/L 136  139     137   Potassium 3.5 - 5.1 mmol/L 3.6  4.2      3.5   Chloride 98 - 111 mmol/L 104  105  104   CO2 22 - 32 mmol/L 25  24     24    Calcium 8.9 - 10.3 mg/dL 8.5  8.9     8.1   Total Protein 6.5 - 8.1 g/dL 6.8     Total Bilirubin 0.3 - 1.2 mg/dL 0.3     Alkaline Phos 38 - 126 U/L 74  88       AST 15 - 41 U/L 35  26       ALT 0 - 44 U/L 18  15          This result is from an external source.     No results found for: "CEA1", "CEA" / No results found for: "CEA1", "CEA" No results found for: "PSA1" No results found for: "AVW098" No results found for: "CAN125"  No results found for: "TOTALPROTELP", "ALBUMINELP", "A1GS", "A2GS", "BETS", "BETA2SER", "GAMS", "MSPIKE", "SPEI" Lab Results  Component Value Date   TIBC 530 (H) 12/26/2022   TIBC 479 (H) 12/25/2022   TIBC 459 (H) 05/01/2022   FERRITIN 3 (L) 12/26/2022   FERRITIN 3 (L) 12/25/2022   FERRITIN 7 (L) 05/01/2022   IRONPCTSAT 2 (L) 12/26/2022   IRONPCTSAT 3 (L) 12/25/2022   IRONPCTSAT 7 (L) 05/01/2022   Lab Results  Component Value Date   LDH 138 01/02/2023     STUDIES:   US Abdomen Limited RUQ (LIVER/GB)  Result Date: 12/31/2022 CLINICAL DATA:  Cirrhosis EXAM: ULTRASOUND ABDOMEN LIMITED RIGHT UPPER QUADRANT COMPARISON:  Ultrasound abdomen 09/24/2021 FINDINGS: Gallbladder: Surgically absent Common bile duct: Diameter: 4 mm Liver: Coarsened echogenicity and nodular contour. No focal lesion. Portal vein demonstrates normal direction blood flow. On the color Doppler image there is suggestion of a possible filling defect versus artifact within the main portal vein. Other: None. IMPRESSION: 1. Cirrhotic morphology of the liver. No focal lesion. 2. Possible filling defect within the main portal vein versus artifact. Recommend dedicated Doppler evaluation of the portal vein. 3. These results will be called to the ordering clinician or representative by the Radiologist Assistant, and communication documented in the PACS or [employer]. Electronically Signed   By: Annia Belt  M.D.   On: 12/31/2022 10:20

## 2023-01-02 ENCOUNTER — Encounter: Payer: Self-pay | Admitting: Hematology

## 2023-01-02 ENCOUNTER — Ambulatory Visit (HOSPITAL_COMMUNITY)
Admission: RE | Admit: 2023-01-02 | Discharge: 2023-01-02 | Disposition: A | Discharge status: Home / Self Care | Payer: Medicare Other | Source: Ambulatory Visit | Attending: Internal Medicine | Admitting: Internal Medicine

## 2023-01-02 ENCOUNTER — Encounter (HOSPITAL_COMMUNITY): Payer: Self-pay

## 2023-01-02 ENCOUNTER — Inpatient Hospital Stay: Payer: Medicare Other | Attending: Hematology | Admitting: Hematology

## 2023-01-02 ENCOUNTER — Other Ambulatory Visit: Payer: Self-pay

## 2023-01-02 ENCOUNTER — Inpatient Hospital Stay: Payer: Medicare Other

## 2023-01-02 VITALS — BP 146/61 | HR 80 | Temp 98.3°F | Resp 16 | Ht 63.0 in | Wt 234.2 lb

## 2023-01-02 DIAGNOSIS — D649 Anemia, unspecified: Secondary | ICD-10-CM

## 2023-01-02 DIAGNOSIS — K5730 Diverticulosis of large intestine without perforation or abscess without bleeding: Secondary | ICD-10-CM | POA: Insufficient documentation

## 2023-01-02 DIAGNOSIS — K3189 Other diseases of stomach and duodenum: Secondary | ICD-10-CM | POA: Insufficient documentation

## 2023-01-02 DIAGNOSIS — Z79899 Other long term (current) drug therapy: Secondary | ICD-10-CM | POA: Diagnosis not present

## 2023-01-02 DIAGNOSIS — D500 Iron deficiency anemia secondary to blood loss (chronic): Secondary | ICD-10-CM | POA: Insufficient documentation

## 2023-01-02 DIAGNOSIS — D509 Iron deficiency anemia, unspecified: Secondary | ICD-10-CM | POA: Insufficient documentation

## 2023-01-02 DIAGNOSIS — K648 Other hemorrhoids: Secondary | ICD-10-CM | POA: Insufficient documentation

## 2023-01-02 DIAGNOSIS — Z1231 Encounter for screening mammogram for malignant neoplasm of breast: Secondary | ICD-10-CM | POA: Diagnosis not present

## 2023-01-02 DIAGNOSIS — K644 Residual hemorrhoidal skin tags: Secondary | ICD-10-CM | POA: Diagnosis not present

## 2023-01-02 LAB — RETICULOCYTES
Immature Retic Fract: 24 % — ABNORMAL HIGH (ref 2.3–15.9)
RBC.: 4 MIL/uL (ref 3.87–5.11)
Retic Count, Absolute: 77.2 10*3/uL (ref 19.0–186.0)
Retic Ct Pct: 1.9 % (ref 0.4–3.1)

## 2023-01-02 LAB — DIRECT ANTIGLOBULIN TEST (NOT AT ARMC)
DAT, IgG: NEGATIVE
DAT, complement: NEGATIVE

## 2023-01-02 LAB — CBC WITH DIFFERENTIAL/PLATELET
Abs Immature Granulocytes: 0.02 10*3/uL (ref 0.00–0.07)
Basophils Absolute: 0.1 10*3/uL (ref 0.0–0.1)
Basophils Relative: 1 %
Eosinophils Absolute: 0.3 10*3/uL (ref 0.0–0.5)
Eosinophils Relative: 5 %
HCT: 28.8 % — ABNORMAL LOW (ref 36.0–46.0)
Hemoglobin: 7.3 g/dL — ABNORMAL LOW (ref 12.0–15.0)
Immature Granulocytes: 0 %
Lymphocytes Relative: 28 %
Lymphs Abs: 1.8 10*3/uL (ref 0.7–4.0)
MCH: 18 pg — ABNORMAL LOW (ref 26.0–34.0)
MCHC: 25.3 g/dL — ABNORMAL LOW (ref 30.0–36.0)
MCV: 71.1 fL — ABNORMAL LOW (ref 80.0–100.0)
Monocytes Absolute: 0.5 10*3/uL (ref 0.1–1.0)
Monocytes Relative: 8 %
Neutro Abs: 3.7 10*3/uL (ref 1.7–7.7)
Neutrophils Relative %: 58 %
Platelets: 184 10*3/uL (ref 150–400)
RBC: 4.05 MIL/uL (ref 3.87–5.11)
RDW: 21.3 % — ABNORMAL HIGH (ref 11.5–15.5)
WBC: 6.4 10*3/uL (ref 4.0–10.5)
nRBC: 0 % (ref 0.0–0.2)

## 2023-01-02 LAB — LACTATE DEHYDROGENASE: LDH: 138 U/L (ref 98–192)

## 2023-01-02 NOTE — Patient Instructions (Signed)
You were seen and examined today by Dr. Ellin Saba. Dr. Ellin Saba is a hematologist, meaning that he specializes in blood abnormalities. Dr. Ellin Saba discussed your past medical history, family history of cancers/blood conditions and the events that led to you being here today.  You were referred to Dr. Ellin Saba due to anemia (low hemoglobin).  Dr. Ellin Saba has recommended additional labs today for further evaluation.  Follow-up as scheduled.

## 2023-01-03 ENCOUNTER — Ambulatory Visit (HOSPITAL_COMMUNITY): Admission: RE | Admit: 2023-01-03 | Payer: Medicare Other | Source: Ambulatory Visit

## 2023-01-03 DIAGNOSIS — E1165 Type 2 diabetes mellitus with hyperglycemia: Secondary | ICD-10-CM | POA: Diagnosis not present

## 2023-01-03 NOTE — Addendum Note (Signed)
Addendum  created 01/03/23 1124 by Oletha Cruel, CRNA   [facility] Staff edited

## 2023-01-04 LAB — TSH: TSH: 1.47 u[IU]/mL (ref 0.450–4.500)

## 2023-01-04 LAB — T4, FREE: Free T4: 1.46 ng/dL (ref 0.82–1.77)

## 2023-01-05 LAB — PROTEIN ELECTROPHORESIS, SERUM
A/G Ratio: 1.1 (ref 0.7–1.7)
Albumin ELP: 3.3 g/dL (ref 2.9–4.4)
Alpha-1-Globulin: 0.3 g/dL (ref 0.0–0.4)
Alpha-2-Globulin: 0.8 g/dL (ref 0.4–1.0)
Beta Globulin: 1.2 g/dL (ref 0.7–1.3)
Gamma Globulin: 0.9 g/dL (ref 0.4–1.8)
Globulin, Total: 3.1 g/dL (ref 2.2–3.9)
Total Protein ELP: 6.4 g/dL (ref 6.0–8.5)

## 2023-01-06 ENCOUNTER — Ambulatory Visit (INDEPENDENT_AMBULATORY_CARE_PROVIDER_SITE_OTHER): Payer: Medicare Other | Admitting: Nurse Practitioner

## 2023-01-06 ENCOUNTER — Encounter: Payer: Self-pay | Admitting: Nurse Practitioner

## 2023-01-06 VITALS — BP 148/74 | HR 87 | Ht 63.0 in | Wt 232.4 lb

## 2023-01-06 DIAGNOSIS — E559 Vitamin D deficiency, unspecified: Secondary | ICD-10-CM | POA: Diagnosis not present

## 2023-01-06 DIAGNOSIS — E782 Mixed hyperlipidemia: Secondary | ICD-10-CM | POA: Diagnosis not present

## 2023-01-06 DIAGNOSIS — E1165 Type 2 diabetes mellitus with hyperglycemia: Secondary | ICD-10-CM

## 2023-01-06 DIAGNOSIS — Z7984 Long term (current) use of oral hypoglycemic drugs: Secondary | ICD-10-CM | POA: Diagnosis not present

## 2023-01-06 LAB — KAPPA/LAMBDA LIGHT CHAINS
Kappa free light chain: 31.2 mg/L — ABNORMAL HIGH (ref 3.3–19.4)
Kappa, lambda light chain ratio: 0.98 (ref 0.26–1.65)
Lambda free light chains: 32 mg/L — ABNORMAL HIGH (ref 5.7–26.3)

## 2023-01-06 LAB — COPPER, SERUM: Copper: 138 ug/dL (ref 80–158)

## 2023-01-06 MED ORDER — METFORMIN HCL 500 MG PO TABS: 500.0000 mg | ORAL_TABLET | Freq: Two times a day (BID) | ORAL | 3 refills | Status: DC

## 2023-01-06 NOTE — Progress Notes (Signed)
01/06/2023, 8:52 AM   Endocrinology follow-up note  Subjective:    Patient ID: Wendy Kramer, female    DOB: 1960-10-18.  Wendy Kramer is being seen in follow-up after she was seen in consultation for management of currently uncontrolled symptomatic diabetes requested by  Benetta Spar, MD.   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   Unspecified asthma with (acute) exacerbation    Vitamin D deficiency     Past Surgical History:  Procedure Laterality Date   BIOPSY  03/05/2018   Procedure: BIOPSY;  Surgeon: Corbin Ade, MD;  Location: [facility];  Service: Endoscopy;;  gastric   CHOLECYSTECTOMY     COLONOSCOPY WITH PROPOFOL N/A 03/05/2018   Dr. Jena Gauss: Diverticulosis in the sigmoid and descending colon.  3 colon polyps removed, 2 hyperplastic and one sessile serrated polyp.  Next colonoscopy 3 years.   COLONOSCOPY WITH PROPOFOL N/A 12/27/2022   Procedure: COLONOSCOPY WITH PROPOFOL;  Surgeon: Franky Macho, MD;  Location: [facility];  Service: Endoscopy;  Laterality: N/A;   ESOPHAGOGASTRODUODENOSCOPY (EGD) WITH PROPOFOL N/A 03/05/2018   Dr. Jena Gauss: Erosive reflux esophagitis, small hiatal hernia, erosive gastropathy with reactive gastropathy on biopsy.  No H. pylori.   ESOPHAGOGASTRODUODENOSCOPY (EGD) WITH PROPOFOL N/A 12/27/2022   Procedure: ESOPHAGOGASTRODUODENOSCOPY (EGD) WITH  PROPOFOL;  Surgeon: Franky Macho, MD;  Location: [facility];  Service: Endoscopy;  Laterality: N/A;   HERNIA REPAIR     umbilical   HOT HEMOSTASIS  12/27/2022   Procedure: HOT HEMOSTASIS (ARGON PLASMA COAGULATION/BICAP);  Surgeon: Franky Macho, MD;  Location: [facility];  Service: Endoscopy;;   POLYPECTOMY  03/05/2018   Procedure: POLYPECTOMY;  Surgeon: Corbin Ade, MD;  Location: [facility];  Service: Endoscopy;;  colon   SHOULDER ARTHROSCOPY Right    TEE WITHOUT CARDIOVERSION  2018   patent foramen ovale   TONSILLECTOMY AND ADENOIDECTOMY     TUBAL LIGATION      Social History   Socioeconomic History   Marital status: Legally Separated    Spouse name: Not on file   Number of children: Not on file   Years of education: Not on file   Highest education level: Not on file  Occupational History   Not on file  Tobacco Use   Smoking status: Every Day    Current packs/day: 0.50    Average packs/day: 0.5 packs/day for 40.0 years (20.0 ttl pk-yrs)    Types:  Cigarettes   Smokeless tobacco: Never  Vaping Use   Vaping status: Never Used  Substance and Sexual Activity   Alcohol use: No   Drug use: No   Sexual activity: Not Currently    Birth control/protection: Post-menopausal, Surgical    Comment: tubal  Other Topics Concern   Not on file  Social History Narrative   Not on file   Social Determinants of Health   Financial Resource Strain: Medium Risk (06/04/2022)   Overall Financial Resource Strain (CARDIA)    Difficulty of Paying Living Expenses: Somewhat hard  Food Insecurity: No Food Insecurity (12/26/2022)   Hunger Vital Sign    Worried About Running Out of Food in the Last Year: Never true    Ran Out of Food in the Last Year: Never true  Transportation Needs: No Transportation Needs (12/26/2022)   PRAPARE - Administrator, Civil Service (Medical): No    Lack of Transportation (Non-Medical): No  Physical Activity: Insufficiently Active  (09/17/2022)   Exercise Vital Sign    Days of Exercise per Week: 3 days    Minutes of Exercise per Session: 30 min  Stress: No Stress Concern Present (06/04/2022)   [employer] of Occupational Health - Occupational Stress Questionnaire    Feeling of Stress : Only a little  Social Connections: Socially Isolated (06/04/2022)   Social Connection and Isolation Panel [NHANES]    Frequency of Communication with Friends and Family: More than three times a week    Frequency of Social Gatherings with Friends and Family: More than three times a week    Attends Religious Services: Never    Database administrator or Organizations: No    Attends Engineer, structural: Never    Marital Status: Separated    Family History  Problem Relation Age of Onset   Breast cancer Maternal Aunt    Colon cancer Other        maternal great grandfather   Liver disease Brother        Fatty liver   HIV Brother    Hypertension Mother    Hyperlipidemia Mother    Hypertension Father    Thyroid disease Sister    Pancreatic cancer Sister    Healthy Daughter    Healthy Son    Healthy Son    Heart disease Son     Outpatient Encounter Medications as of 01/06/2023  Medication Sig   Accu-Chek Softclix Lancets lancets Use as instructed to monitor glucose 1 times daily.   acetaminophen (TYLENOL) 500 MG tablet Take 500 mg by mouth every 6 (six) hours as needed for moderate pain.   albuterol (PROVENTIL HFA;VENTOLIN HFA) 108 (90 Base) MCG/ACT inhaler Inhale 1 puff into the lungs every 6 (six) hours as needed for wheezing or shortness of breath.   Blood Glucose Monitoring Suppl (ACCU-CHEK GUIDE ME) w/Device KIT 1 Piece by Does not apply route as directed.   BREO ELLIPTA 200-25 MCG/ACT AEPB Inhale 1 puff into the lungs daily.   fluticasone (FLONASE) 50 MCG/ACT nasal spray Place 1 spray into both nostrils daily as needed for allergies or rhinitis.    glucose blood (ACCU-CHEK GUIDE) test strip USE TO CHECK BLOOD  SUGARS ONCE DAILY   lisinopril (ZESTRIL) 20 MG tablet Take 10 mg by mouth daily.   metFORMIN (GLUCOPHAGE) 500 MG tablet Take 1 tablet (500 mg total) by mouth 2 (two) times daily with a meal.   Multiple Vitamins-Minerals (ONE DAILY MULTIVIT/IRON-FREE) TABS Take 1 tablet by  mouth every evening.   RABEprazole (ACIPHEX) 20 MG tablet Take 1 tablet (20 mg total) by mouth 2 (two) times daily before a meal.   sodium chloride (OCEAN) 0.65 % SOLN nasal spray Place 1 spray into both nostrils as needed for congestion.   traMADol (ULTRAM) 50 MG tablet Take 50-100 mg by mouth every 8 (eight) hours as needed for moderate pain.   [DISCONTINUED] metFORMIN (GLUCOPHAGE) 500 MG tablet Take 500 mg by mouth 2 (two) times daily with a meal.   No facility-administered encounter medications on file as of 01/06/2023.    ALLERGIES: Allergies  Allergen Reactions   Asa [Aspirin] Anaphylaxis    Tolerates other NSAIDS   Augmentin [Amoxicillin-Pot Clavulanate] Other (See Comments)    "Retching and pass out"   Levaquin [Levofloxacin In D5w] Itching and Other (See Comments)    "body turned red"   Lovenox [Enoxaparin Sodium] Other (See Comments)    physician instructed not to use ever again. "white veins popped out"    VACCINATION STATUS: There is no immunization history for the selected administration types on file for this patient.  Diabetes She presents for her follow-up diabetic visit. She has type 2 diabetes mellitus. Onset time: She was diagnosed at approximate age of 68 years, after several years of prediabetes. Her disease course has been improving. There are no hypoglycemic associated symptoms. Pertinent negatives for hypoglycemia include no confusion, headaches, pallor or seizures. Pertinent negatives for diabetes include no chest pain, no fatigue, no polydipsia, no polyphagia and no polyuria. There are no hypoglycemic complications. Symptoms are stable. Diabetic complications include a CVA. Risk factors for  coronary artery disease include diabetes mellitus, dyslipidemia, family history, obesity, tobacco exposure, post-menopausal, sedentary lifestyle and hypertension. Current diabetic treatment includes oral agent (monotherapy). She is compliant with treatment some of the time (lowered down to 500 mg po BID herself due to GI SE). Her weight is fluctuating minimally. She is following a generally unhealthy diet. When asked about meal planning, she reported none. She has not had a previous visit with a dietitian. She never participates in exercise. Her home blood glucose trend is decreasing steadily. Her overall blood glucose range is 140-180 mg/dl. (She presents today with her meter, no logs, showing improved but still above target fasting glycemic profile.  Her previsit A1c on 9/26 was 9.4%, improving from last visit of 10%.  She has had COVID since last visit and is now getting iron infusions for her anemia. She is taking Metformin 500 mg po twice daily, cannot tolerate higher doses in either regular or ER form due to GI side effects.  Analysis of her meter shows 7-day average of 156, 14-day average of 160, 30-day average of 175, 90-day average of 183.) An ACE inhibitor/angiotensin II receptor blocker is being taken. She does not see a podiatrist.Eye exam is current.  Hyperlipidemia This is a chronic problem. The current episode started more than 1 year ago. Factors aggravating her hyperlipidemia include fatty foods. Pertinent negatives include no chest pain, myalgias or shortness of breath. She is currently on no antihyperlipidemic treatment. Compliance problems include adherence to diet and adherence to exercise.  Risk factors for coronary artery disease include diabetes mellitus, hypertension, obesity, post-menopausal and a sedentary lifestyle.  Hypertension This is a chronic problem. The current episode started more than 1 year ago. The problem has been waxing and waning since onset. The problem is uncontrolled.  Pertinent negatives include no chest pain, headaches, palpitations or shortness of breath. There are no associated  agents to hypertension. Risk factors for coronary artery disease include dyslipidemia, diabetes mellitus, obesity, smoking/tobacco exposure, sedentary lifestyle, post-menopausal state and family history. Past treatments include ACE inhibitors and diuretics. The current treatment provides moderate improvement. Compliance problems include diet and exercise.  Hypertensive end-organ damage includes CVA. Identifiable causes of hypertension include sleep apnea.    Review of systems  Constitutional: + Minimally fluctuating body weight,  current Body mass index is 41.17 kg/m. , no fatigue, no subjective hyperthermia, no subjective hypothermia Eyes: no blurry vision, no xerophthalmia ENT: no sore throat, no nodules palpated in throat, no dysphagia/odynophagia, no hoarseness Cardiovascular: no chest pain, no shortness of breath, no palpitations, no leg swelling Respiratory: no cough, no shortness of breath Gastrointestinal: no nausea/vomiting/diarrhea Musculoskeletal: no muscle/joint aches Skin: no rashes, no hyperemia Neurological: no tremors, no numbness, no tingling, no dizziness Psychiatric: no depression, no anxiety   Objective:    BP (!) 148/74 (BP Location: Right Arm, Patient Position: Sitting, Cuff Size: Large) Comment: Patient states that she takes her BP med at night. She has high BP in the mornings then back to normal. Patient advised to followup with her PCP . Wendy Kramer was made aware.  Pulse 87   Ht 5\' 3"  (1.6 m)   Wt 232 lb 6.4 oz (105.4 kg)   LMP 09/06/2016   BMI 41.17 kg/m   Wt Readings from Last 3 Encounters:  01/06/23 232 lb 6.4 oz (105.4 kg)  01/02/23 234 lb 3.2 oz (106.2 kg)  12/29/22 233 lb 0.4 oz (105.7 kg)   BP Readings from Last 3 Encounters:  01/06/23 (!) 148/74  01/02/23 (!) 146/61  12/29/22 (!) 165/72     Physical Exam- Limited  Constitutional:   Body mass index is 41.17 kg/m. , not in acute distress, normal state of mind Eyes:  EOMI, no exophthalmos Musculoskeletal: no gross deformities, strength intact in all four extremities, no gross restriction of joint movements Skin:  no rashes, no hyperemia Neurological: no tremor with outstretched hands   Diabetic Foot Exam - Simple   Simple Foot Form Diabetic Foot exam was performed with the following findings: Yes 01/06/2023  8:42 AM  Visual Inspection No deformities, no ulcerations, no other skin breakdown bilaterally: Yes Sensation Testing Intact to touch and monofilament testing bilaterally: Yes Pulse Check Posterior Tibialis and Dorsalis pulse intact bilaterally: Yes Comments     CMP ( most recent) CMP     Component Value Date/Time   NA 136 12/29/2022 1947   NA 139 12/28/2022 0000   K 3.6 12/29/2022 1947   CL 104 12/29/2022 1947   CO2 25 12/29/2022 1947   GLUCOSE 148 (H) 12/29/2022 1947   BUN 12 12/29/2022 1947   BUN 12 02/15/2020 1018   CREATININE 0.74 12/29/2022 1947   CREATININE 0.67 12/25/2022 0759   CALCIUM 8.5 (L) 12/29/2022 1947   PROT 6.8 12/29/2022 1947   PROT 7.2 02/15/2020 1018   ALBUMIN 3.4 (L) 12/29/2022 1947   ALBUMIN 4.5 02/15/2020 1018   AST 35 12/29/2022 1947   ALT 18 12/29/2022 1947   ALKPHOS 74 12/29/2022 1947   BILITOT 0.3 12/29/2022 1947   BILITOT 0.4 02/15/2020 1018   GFRNONAA >60 12/29/2022 1947   GFRAA 85 02/15/2020 1018     Diabetic Labs (most recent): Lab Results  Component Value Date   HGBA1C 9.4 (H) 12/26/2022   HGBA1C 10.0 (A) 06/24/2022   HGBA1C 9.6 (A) 12/27/2021   MICROALBUR 80 02/09/2020     Lab Results  Component Value Date  TSH 1.470 01/03/2023   TSH 1.56 05/01/2022   TSH 1.85 07/23/2019   FREET4 1.46 01/03/2023   FREET4 1.2 05/01/2022      Assessment & Plan:   1) Uncontrolled type 2 diabetes mellitus with hyperglycemia (HCC)  - Wendy Kramer has currently uncontrolled symptomatic type 2 DM since  62  years of age.  She presents today with her meter, no logs, showing improved but still above target fasting glycemic profile.  Her previsit A1c on 9/26 was 9.4%, improving from last visit of 10%.  She has had COVID since last visit and is now getting iron infusions for her anemia. She is taking Metformin 500 mg po twice daily, cannot tolerate higher doses in either regular or ER form due to GI side effects.  Analysis of her meter shows 7-day average of 156, 14-day average of 160, 30-day average of 175, 90-day average of 183.  - I had a long discussion with her about the progressive nature of diabetes and the pathology behind its complications. -her diabetes is complicated by obesity/sedentary life, active smoking and she remains at a high risk for more acute and chronic complications which include CAD, CVA, CKD, retinopathy, and neuropathy. These are all discussed in detail with her.  The following Lifestyle Medicine recommendations according to American College of Lifestyle Medicine [facility]) were discussed and offered to patient and she agrees to start the journey:  A. Whole Foods, Plant-based plate comprising of fruits and vegetables, plant-based proteins, whole-grain carbohydrates was discussed in detail with the patient.   A list for source of those nutrients were also provided to the patient.  Patient will use only water or unsweetened tea for hydration. B.  The need to stay away from risky substances including alcohol, smoking; obtaining 7 to 9 hours of restorative sleep, at least 150 minutes of moderate intensity exercise weekly, the importance of healthy social connections,  and stress reduction techniques were discussed. C.  A full color page of  Calorie density of various food groups per pound showing examples of each food groups was provided to the patient.  - Nutritional counseling repeated at each appointment due to patients tendency to fall back in to old habits.  - The patient admits there  is a room for improvement in their diet and drink choices. -  Suggestion is made for the patient to avoid simple carbohydrates from their diet including Cakes, Sweet Desserts / Pastries, Ice Cream, Soda (diet and regular), Sweet Tea, Candies, Chips, Cookies, Sweet Pastries, Store Bought Juices, Alcohol in Excess of 1-2 drinks a day, Artificial Sweeteners, Coffee Creamer, and "Sugar-free" Products. This will help patient to have stable blood glucose profile and potentially avoid unintended weight gain.   - I encouraged the patient to switch to unprocessed or minimally processed complex starch and increased protein intake (animal or plant source), fruits, and vegetables.   - Patient is advised to stick to a routine mealtimes to eat 3 meals a day and avoid unnecessary snacks (to snack only to correct hypoglycemia).  - she will be scheduled with Wendy Kramer, RDN, CDE for diabetes education.  - I have approached her with the following individualized plan to manage  her diabetes and patient agrees:   -Will switch her back to Metformin 1000 mg ER daily at bedtime (to help with GI SE).   I also discussed the possibility of trying GLP1 vs basal insulin if we continue to struggle getting glucose managed. She would like to work harder on [facility]  lifestyle first.   She is encouraged to start monitoring glucose once daily, before breakfast to help her regain control of her diabetes.   - Specific targets for  A1c;  LDL, HDL,  and Triglycerides were discussed with the patient.  2) Blood Pressure /Hypertension:   Her blood pressure is still not controlled to target.  She is advised to continue Lisinopril 20 mg po daily and HCTZ 12.5 mg twice a week and follow up with her PCP for concerns.   3) Lipids/Hyperlipidemia:  Her recent lipid panel from 12/28/22 shows controlled LDL of 72.  She is not currently on any lipid lowering agents. medications.   4) Weight/Diet:  Her Body mass index is 41.17 kg/m.  -    clearly complicating her diabetes care.   she is  a candidate for weight loss. I discussed with her the fact that loss of 5 - 10% of her  current body weight will have the most impact on her diabetes management.  Exercise, and detailed carbohydrates information provided  -  detailed on discharge instructions.  5) Chronic Care/Health Maintenance: -she is on ACEI medications and is encouraged to initiate and continue to follow up with Ophthalmology, Dentist,  Podiatrist at least yearly or according to recommendations, and advised to  Quit smoking. I have recommended yearly flu vaccine and pneumonia vaccine at least every 5 years; moderate intensity exercise for up to 150 minutes weekly; and  sleep for at least 7 hours a day.  - she is advised to maintain close follow up with Benetta Spar, MD for primary care needs, as well as her other providers for optimal and coordinated care.      I spent  50  minutes in the care of the patient today including review of labs from CMP, Lipids, Thyroid Function, Hematology (current and previous including abstractions from other facilities); face-to-face time discussing  her blood glucose readings/logs, discussing hypoglycemia and hyperglycemia episodes and symptoms, medications doses, her options of short and long term treatment based on the latest standards of care / guidelines;  discussion about incorporating lifestyle medicine;  and documenting the encounter. Risk reduction counseling performed per USPSTF guidelines to reduce obesity and cardiovascular risk factors.     Please refer to Patient Instructions for Blood Glucose Monitoring and Insulin/Medications Dosing Guide"  in media tab for additional information. Please  also refer to " Patient Self Inventory" in the Media  tab for reviewed elements of pertinent patient history.  Wendy Kramer participated in the discussions, expressed understanding, and voiced agreement with the above plans.  All  questions were answered to her satisfaction. she is encouraged to contact clinic should she have any questions or concerns prior to her return visit.   Follow up plan: - Return in about 3 months (around 04/08/2023) for Diabetes F/U with A1c in office, No previsit labs.  Ronny Bacon, [facility] [address] [place], [place] [address] Phone: [phone] Fax: [phone]  01/06/2023, 8:52 AM

## 2023-01-08 ENCOUNTER — Ambulatory Visit (HOSPITAL_COMMUNITY)
Admission: RE | Admit: 2023-01-08 | Discharge: 2023-01-08 | Disposition: A | Discharge status: Home / Self Care | Payer: Medicare Other | Source: Ambulatory Visit | Attending: Gastroenterology | Admitting: Gastroenterology

## 2023-01-08 DIAGNOSIS — K7460 Unspecified cirrhosis of liver: Secondary | ICD-10-CM | POA: Insufficient documentation

## 2023-01-08 DIAGNOSIS — K7689 Other specified diseases of liver: Secondary | ICD-10-CM | POA: Diagnosis not present

## 2023-01-08 DIAGNOSIS — R935 Abnormal findings on diagnostic imaging of other abdominal regions, including retroperitoneum: Secondary | ICD-10-CM | POA: Diagnosis not present

## 2023-01-09 ENCOUNTER — Encounter: Payer: Self-pay | Admitting: *Deleted

## 2023-01-09 ENCOUNTER — Ambulatory Visit: Payer: Self-pay | Admitting: *Deleted

## 2023-01-09 LAB — IMMUNOFIXATION ELECTROPHORESIS
IgA: 269 mg/dL (ref 87–352)
IgG (Immunoglobin G), Serum: 886 mg/dL (ref 586–1602)
IgM (Immunoglobulin M), Srm: 103 mg/dL (ref 26–217)
Total Protein ELP: 6.3 g/dL (ref 6.0–8.5)

## 2023-01-10 LAB — METHYLMALONIC ACID, SERUM: Methylmalonic Acid, Quantitative: 178 nmol/L (ref 0–378)

## 2023-01-13 ENCOUNTER — Inpatient Hospital Stay: Payer: Medicare Other

## 2023-01-16 NOTE — Patient Outreach (Signed)
Care Coordination   Health Equity Plan Follow Up Visit Note   01/09/2023 Name: Wendy Kramer MRN: [id number] DOB: 11-14-1960  Wendy Kramer is a 62 y.o. year old female who sees [name], Wayland Salinas, MD for primary care. I spoke with  [name] by phone today.  What matters to the patients health and wellness today?  Managing blood sugar and anemia     Goals Addressed             This Visit's Progress    Over the next 6 months patient will decrease A1C to < 7 (Health Equity Plan)   On track    Care Coordination Goals: Patient will follow-up with endocrinologist every 3 months or as recommended Endocrinology f/u scheduled for 04/08/23 Patient will schedule yearly eye exam Patient will take medication as prescribed and reach out to provider with any negative side effects Patient will continue to monitor and record blood sugar daily and as needed with glucometer, and will call endocrinologist with any readings less than 70 or for 3 readings in a row over 200 Patient will take blood sugar log and meter to provider visits for review Patient will follow a carb modified diet with 3 meals per day that include 30GM of carbohydrates, lean proteins, and healthy fats and less than 2 snacks per day with less than 15GM of carbohydrates  Patient will increase physical activity as tolerated (limited by joint pain) with an ultimate goal of at least 150 minutes of moderate activity per week Patient will continue to monitor and record blood pressure twice per day, since HTN is a comorbidity of DM, and reach out to PCP with any readings outside of recommended range Patient will reach out to RN Care Coordinator [phone] with any care coordination or resource needs        SDOH assessments and interventions completed:  Yes  SDOH Interventions Today    Flowsheet Row Most Recent Value  SDOH Interventions   Housing Interventions Intervention Not Indicated  Transportation Interventions  Patient Resources (Friends/Family), Payor Benefit        Care Coordination Interventions:  Yes, provided  Interventions Today    Flowsheet Row Most Recent Value  Chronic Disease   Chronic disease during today's visit Diabetes, Other, Hypertension (HTN)  [hyperlipidemia]  General Interventions   General Interventions Discussed/Reviewed General Interventions Discussed, General Interventions Reviewed, Annual Eye Exam, Durable Medical Equipment (DME), Doctor Visits, Community Resources  Doctor Visits Discussed/Reviewed Doctor Visits Discussed, Doctor Visits Reviewed, Annual Wellness Visits, PCP, Specialist  [reviewed and discussed recent endocrinology and gastroenterology visits]  Durable Medical Equipment (DME) BP Cuff, Glucomoter  [continous glucose monitor. glucose 150. No home  blood pressure readings available for review]  PCP/Specialist Visits Compliance with follow-up visit  [01/30/23 oncology, 04/08/23 endocrinology. scheduled for endoscopy/colonoscopy]  Exercise Interventions   Exercise Discussed/Reviewed Exercise Discussed, Exercise Reviewed, Physical Activity  Physical Activity Discussed/Reviewed Physical Activity Discussed, Physical Activity Reviewed  [limitd by joint pain but encouraged to increase as tolerated with an ultimate goal of 150 minutes per week]  Education Interventions   Education Provided Provided Education  Provided Verbal Education On Nutrition, Labs, Blood Sugar Monitoring, Eye Care, When to see the doctor, [facility], Mental Health/Coping with Illness, Exercise, Medication  [provider information on ADTS [facility]ATS and telephone number]  Labs Reviewed Hgb A1c, Lipid Profile  [01/29/23 Hgb 7.3.  12/26/22 A1C 9.4% 12/28/22 Triglycerides 107, HDL 49, LDL 72]  Mental Health Interventions   Mental Health Discussed/Reviewed Mental Health Discussed, Mental  Health Reviewed  [therapeutic listening utilized]  Nutrition Interventions   Nutrition Discussed/Reviewed Nutrition  Discussed, Nutrition Reviewed, Carbohydrate meal planning, Adding fruits and vegetables, Fluid intake, Increasing proteins, Decreasing sugar intake  [3 meals a day with 30 GM of CHO and up to 2 snacks per day, if needed, with less than 15 GM of CHO]  Pharmacy Interventions   Pharmacy Dicussed/Reviewed Pharmacy Topics Discussed, Pharmacy Topics Reviewed, Medications and their functions  [metformin 500 mg BID. Taking all meds as directed]  Safety Interventions   Safety Discussed/Reviewed Safety Discussed, Safety Reviewed, Fall Risk, Home Safety  Home Safety Assistive Devices  Advanced Directive Interventions   Advanced Directives Discussed/Reviewed Advanced Directives Discussed, Advanced Care Planning  [encouraged to consider]       Follow up plan: Follow up call scheduled for 01/28/23    Encounter Outcome:  Patient Visit Completed   Demetrios Loll, RN, BSN Care Management Coordinator [facility]  [facility] Direct Dial: [phone] Main #: [phone]

## 2023-01-23 DIAGNOSIS — I10 Essential (primary) hypertension: Secondary | ICD-10-CM | POA: Diagnosis not present

## 2023-01-23 DIAGNOSIS — J449 Chronic obstructive pulmonary disease, unspecified: Secondary | ICD-10-CM | POA: Diagnosis not present

## 2023-01-28 ENCOUNTER — Ambulatory Visit: Payer: Self-pay | Admitting: *Deleted

## 2023-01-28 ENCOUNTER — Encounter: Payer: Self-pay | Admitting: *Deleted

## 2023-01-28 NOTE — Patient Outreach (Signed)
  Care Coordination   Health Equity Plan Follow Up Visit Note   01/28/2023 Name: Wendy Kramer MRN: [id number] DOB: November 20, 1960  Wendy Kramer is a 62 y.o. year old female who sees [name], Wayland Salinas, MD for primary care. I spoke with  [name] by phone today to follow-up on anemia.  What matters to the patients health and wellness today?  Finding out more about cause of anemia    Goals Addressed             This Visit's Progress    Manage Anemia       Care Coordination Goals: Patient will keep appointment with hematologist on 01/30/23 Patient will have repeat CBC prior to appointment with hematologist Patient will monitor for any symptoms of anemia or bleeding and will reach out to provider if any symptoms noted Patient will keep appointment for endoscopy with Dr Jena Gauss on 02/12/23 Patient will eat iron rich foods with vitamin C to help with absorption Patient will reach out to RN Care Management Coordinator [phone] with any resource or care coordination needs        SDOH assessments and interventions completed:  Yes  SDOH Interventions Today    Flowsheet Row Most Recent Value  SDOH Interventions   Transportation Interventions Patient Resources (Friends/Family), Intervention Not Indicated, Payor Benefit  Financial Strain Interventions Intervention Not Indicated        Care Coordination Interventions:  Yes, provided  Interventions Today    Flowsheet Row Most Recent Value  Chronic Disease   Chronic disease during today's visit Diabetes, Hypertension (HTN), Other  [Anemia]  General Interventions   General Interventions Discussed/Reviewed General Interventions Discussed, General Interventions Reviewed, Doctor Visits  Doctor Visits Discussed/Reviewed Doctor Visits Discussed, Doctor Visits Reviewed, Specialist, PCP  PCP/Specialist Visits Compliance with follow-up visit  [01/30/23 oncology, endocsopy scheduled for 02/12/23, 04/08/23 with endocrinology]   Exercise Interventions   Exercise Discussed/Reviewed Physical Activity  Physical Activity Discussed/Reviewed Physical Activity Discussed, Physical Activity Reviewed  [able to perform ADLs. She does feel a little weaker than she did initially after hosptial discharge]  Education Interventions   Education Provided Provided Education, Provided Printed Education  [Handout on iron rich foods]  Provided Verbal Education On Nutrition, Labs, When to see the doctor, Medication  [monitor for signs/symptoms of anemia or bleeding and notify provider]  Labs Reviewed --  [01/02/23 Hgb 7.3. Orders are in place for future CBC]  Nutrition Interventions   Nutrition Discussed/Reviewed Nutrition Discussed, Nutrition Reviewed, Increasing proteins, Adding fruits and vegetables  [iron rich foods paired with Vitamin C for absorption]  Pharmacy Interventions   Pharmacy Dicussed/Reviewed Pharmacy Topics Discussed, Pharmacy Topics Reviewed, Medications and their functions  [taking medications as directed]  Safety Interventions   Safety Discussed/Reviewed Safety Discussed, Safety Reviewed, Fall Risk, Home Safety  Home Safety Assistive Devices       Follow up plan: Follow up call scheduled for 02/06/23    Encounter Outcome:  Patient Visit Completed   Demetrios Loll, RN, BSN Care Management Coordinator [facility]  [facility] Direct Dial: [phone] Main #: [phone]

## 2023-01-30 ENCOUNTER — Inpatient Hospital Stay: Payer: Medicare Other | Admitting: Hematology

## 2023-01-30 ENCOUNTER — Inpatient Hospital Stay: Payer: Medicare Other

## 2023-01-30 VITALS — BP 159/60 | HR 82 | Temp 98.2°F | Resp 19 | Wt 228.3 lb

## 2023-01-30 DIAGNOSIS — Z79899 Other long term (current) drug therapy: Secondary | ICD-10-CM | POA: Diagnosis not present

## 2023-01-30 DIAGNOSIS — K5730 Diverticulosis of large intestine without perforation or abscess without bleeding: Secondary | ICD-10-CM | POA: Diagnosis not present

## 2023-01-30 DIAGNOSIS — K644 Residual hemorrhoidal skin tags: Secondary | ICD-10-CM | POA: Diagnosis not present

## 2023-01-30 DIAGNOSIS — D500 Iron deficiency anemia secondary to blood loss (chronic): Secondary | ICD-10-CM

## 2023-01-30 DIAGNOSIS — K3189 Other diseases of stomach and duodenum: Secondary | ICD-10-CM | POA: Diagnosis not present

## 2023-01-30 DIAGNOSIS — D649 Anemia, unspecified: Secondary | ICD-10-CM

## 2023-01-30 DIAGNOSIS — K648 Other hemorrhoids: Secondary | ICD-10-CM | POA: Diagnosis not present

## 2023-01-30 LAB — CBC
HCT: 30.9 % — ABNORMAL LOW (ref 36.0–46.0)
Hemoglobin: 8.2 g/dL — ABNORMAL LOW (ref 12.0–15.0)
MCH: 18.3 pg — ABNORMAL LOW (ref 26.0–34.0)
MCHC: 26.5 g/dL — ABNORMAL LOW (ref 30.0–36.0)
MCV: 69 fL — ABNORMAL LOW (ref 80.0–100.0)
Platelets: 216 10*3/uL (ref 150–400)
RBC: 4.48 MIL/uL (ref 3.87–5.11)
RDW: 20.8 % — ABNORMAL HIGH (ref 11.5–15.5)
WBC: 9 10*3/uL (ref 4.0–10.5)
nRBC: 0 % (ref 0.0–0.2)

## 2023-01-30 NOTE — Patient Instructions (Signed)
[facility]  Discharge Instructions  You were seen and examined today by Dr. Ellin Saba.  Dr. Ellin Saba discussed your most recent lab work which revealed that everything looks good and stable except for your iron being low.  Have your Iron infusion.  Follow-up as scheduled in 2 months.    Thank you for choosing [facility] to provide your oncology and hematology care.   To afford each patient quality time with our provider, please arrive at least 15 minutes before your scheduled appointment time. You may need to reschedule your appointment if you arrive late (10 or more minutes). Arriving late affects you and other patients whose appointments are after yours.  Also, if you miss three or more appointments without notifying the office, you may be dismissed from the clinic at the provider's discretion.    Again, thank you for choosing [facility].  Our hope is that these requests will decrease the amount of time that you wait before being seen by our physicians.   If you have a lab appointment with the Cancer Center - please note that after April 8th, all labs will be drawn in the cancer center.  You do not have to check in or register with the main entrance as you have in the past but will complete your check-in at the cancer center.            _____________________________________________________________  Should you have questions after your visit to [facility], please contact our office at [phone] and follow the prompts.  Our office hours are 8:00 a.m. to 4:30 p.m. Monday - Thursday and 8:00 a.m. to 2:30 p.m. Friday.  Please note that voicemails left after 4:00 p.m. may not be returned until the following business day.  We are closed weekends and all major holidays.  You do have access to a nurse 24-7, just call the main number to the clinic [phone] and do not press any options, hold on the line and  a nurse will answer the phone.    For prescription refill requests, have your pharmacy contact our office and allow 72 hours.    Masks are no longer required in the cancer centers. If you would like for your care team to wear a mask while they are taking care of you, please let them know. You may have one support person who is at least 63 years old accompany you for your appointments.

## 2023-01-30 NOTE — Progress Notes (Signed)
[facility] [address], [place] [address]   Clinic Day:  01/30/2023  Referring physician: Benetta Spar*  Patient Care Team: Benetta Spar, MD as PCP - General (Internal Medicine) Jena Gauss [name], MD as Consulting Physician (Gastroenterology) Gwenith Daily, RN as [facility] Care Management   ASSESSMENT & PLAN:   Assessment:  1.  Severe iron deficiency anemia from blood loss: - CBC on 12/26/2022 with Hb-6.5, MCV-68.  Ferritin was 3 and percent saturation 2. - She received half unit of PRBC and developed hives.  Transfusion was abandoned.  She received Venofer 1 dose 100 mg. - Colonoscopy (12/27/2022): Diverticulosis of the left colon, nonbleeding internal and external hemorrhoids. - EGD (12/27/2022): Portal hypertensive gastropathy, GAVE with bleeding.  Treated with APC.  Normal duodenal bulb and second part of duodenum.  2. Social/Family History: -Lives at home with daughter and 3 grandchildren. Retired with disability, used to be a Energy manager. Tobacco use of 0.5 ppd since age 1.  -No family history if anemia. Maternal aunt had breast cancer. Maternal great-grandfather had colon cancer.   Plan:  1.  Severe iron deficiency anemia from blood loss: - Reviewed blood work from 01/02/2023.  Hemolytic workup was negative.  Copper and MMA levels are normal.  SPEP, free light chains and immunofixation were normal. - CBC today shows hemoglobin is 8.2 with MCV of 69. - We will give her Monoferric 1 g IV x 1.  We talked about side effects including rare chance of anaphylactic reaction. - Recommend follow-up in 2 months with CBC, ferritin and iron panel.   Orders Placed This Encounter  Procedures   CBC    Standing Status:   Future    Standing Expiration Date:   01/30/2024   Ferritin    Standing Status:   Future    Standing Expiration Date:   01/30/2024    Order Specific Question:   Release to patient    Answer:   Immediate   Iron and  TIBC    Standing Status:   Future    Standing Expiration Date:   01/30/2024    Order Specific Question:   Release to patient    Answer:   Immediate      I,Helena R Teague,acting as a scribe for Doreatha Massed, MD.,have documented all relevant documentation on the behalf of Doreatha Massed, MD,as directed by  Doreatha Massed, MD while in the presence of Doreatha Massed, MD.  I, Doreatha Massed MD, have reviewed the above documentation for accuracy and completeness, and I agree with the above.    Doreatha Massed, MD   10/31/20245:26 PM  CHIEF COMPLAINT/PURPOSE OF CONSULT:   Diagnosis: Severe iron deficiency anemia  Current Therapy: Monoferric  HISTORY OF PRESENT ILLNESS:   Wendy Kramer is a 62 y.o. female presenting to clinic today for evaluation of anemia at the request of Wendy Kramer, [name], MD.  Today, she states that she is doing well overall. Her appetite level is at 75%. Her energy level is at 75%.  Patient presented to the ED on 12/29/22 for generalized weakness. She also was admitted to the ED on 12/26/22 for acute blood loss anemia. She underwent a colonoscopy and EGD on 12/27/22 while hospitalized with findings of GAVE and APC. There was hemorrhaging in the colon that was cauterized. She had a rash develop and became tachycardic while having 2 units of PRBC transfused and had to stop receiving transfusion after half of 1 unit. She then had  IV iron and notes she felt a burning sensation at the IV site. IV site is sore today. Of note, she does not tolerate iron supplements and would like to receive IV iron when necessary.   She was found to have abnormal CBC from 12/29/22 with low RBC at 3.83, low HGB at 7.1, low HCT at 26.7, low MCV at 69.7, low MCH at 18.5, and elevated RDW at 20.9.   She reports she may have had bleeding issues before coming to the ED as labs showed she had anemia 3 months ago. Anemia was thought to be caused from a GI bleed and she went to see  a gastroenterologist. She denies any melena or BRBPR. She denies any previous transfusions. She reports ice pica and occasional lightheadedness. She denies any chest pain. She does not tolerate iron pills well, and report they cause her to have stomach issues. She used to be on Coumadin for PFO and took iron pills at that time from heavy menstrual cycles in the past that she states do not bother her, unlike the ones she received while hospitalized.   She reports arthritis that she treats with Motrin 2-3x a day. She notes she developed stomach issues a few months ago that she contributes to long-term Motrin use and discontinued Motrin a few weeks ago.   INTERVAL HISTORY:   Wendy Kramer is a 62 y.o. female presenting to the clinic today for follow-up of severe iron deficiency anemia. She was last seen by me on 01/02/23.  Today, she states that she is doing well overall. Her appetite level is at 60%. Her energy level is at 60%.   She did not show to her last scheduled IV iron because her daughter got sick. She is hesitant to receive IV iron, as she does not want to have a reaction to it like she had with her blood transfusion. She has an endoscopy scheduled for 02/12/23 with Dr. Jena Gauss.   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   Unspecified asthma with (acute) exacerbation    Vitamin D deficiency     Surgical History: Past Surgical History:   Procedure Laterality Date   BIOPSY  03/05/2018   Procedure: BIOPSY;  Surgeon: Corbin Ade, MD;  [facility];  Service: Endoscopy;;  gastric   CHOLECYSTECTOMY     COLONOSCOPY WITH PROPOFOL N/A 03/05/2018   Dr. Jena Gauss: Diverticulosis in the sigmoid and descending colon.  3 colon polyps removed, 2 hyperplastic and one sessile serrated polyp.  Next colonoscopy 3 years.   COLONOSCOPY WITH PROPOFOL N/A 12/27/2022   Procedure: COLONOSCOPY WITH PROPOFOL;  Surgeon: Franky Macho, MD;  [facility]: [facility];  Service: Endoscopy;  Laterality: N/A;   ESOPHAGOGASTRODUODENOSCOPY (EGD) WITH PROPOFOL N/A 03/05/2018   Dr. Jena Gauss: Erosive reflux esophagitis,  small hiatal hernia, erosive gastropathy with reactive gastropathy on biopsy.  No H. pylori.   ESOPHAGOGASTRODUODENOSCOPY (EGD) WITH PROPOFOL N/A 12/27/2022   Procedure: ESOPHAGOGASTRODUODENOSCOPY (EGD) WITH PROPOFOL;  Surgeon: Franky Macho, MD;  [facility];  Service: Endoscopy;  Laterality: N/A;   HERNIA REPAIR     umbilical   HOT HEMOSTASIS  12/27/2022   Procedure: HOT HEMOSTASIS (ARGON PLASMA COAGULATION/BICAP);  Surgeon: Franky Macho, MD;  [facility];  Service: Endoscopy;;   POLYPECTOMY  03/05/2018   Procedure: POLYPECTOMY;  Surgeon: Corbin Ade, MD;  [facility];  Service: Endoscopy;;  colon   SHOULDER ARTHROSCOPY Right    TEE WITHOUT CARDIOVERSION  2018   patent foramen ovale   TONSILLECTOMY AND ADENOIDECTOMY     TUBAL LIGATION      Social History: Social History   Socioeconomic History   Marital status: Legally Separated    Spouse name: Not on file   Number of children: Not on file   Years of education: Not on file   Highest education level: Not on file  Occupational History   Not on file  Tobacco Use   Smoking status: Every Day    Current packs/day: 0.50    Average packs/day: 0.5 packs/day for 40.0 years (20.0 ttl pk-yrs)    Types: Cigarettes   Smokeless tobacco:  Never  Vaping Use   Vaping status: Never Used  Substance and Sexual Activity   Alcohol use: No   Drug use: No   Sexual activity: Not Currently    Birth control/protection: Post-menopausal, Surgical    Comment: tubal  Other Topics Concern   Not on file  Social History Narrative   Not on file   Social Determinants of Health   Financial Resource Strain: Low Risk  (01/28/2023)   Overall Financial Resource Strain (CARDIA)    Difficulty of Paying Living Expenses: Not very hard  Food Insecurity: No Food Insecurity (12/26/2022)   Hunger Vital Sign    Worried About Running Out of Food in the Last Year: Never true    Ran Out of Food in the Last Year: Never true  Transportation Needs: No Transportation Needs (01/28/2023)   PRAPARE - Administrator, Civil Service (Medical): No    Lack of Transportation (Non-Medical): No  Physical Activity: Insufficiently Active (09/17/2022)   Exercise Vital Sign    Days of Exercise per Week: 3 days    Minutes of Exercise per Session: 30 min  Stress: No Stress Concern Present (06/04/2022)   [employer] of Occupational Health - Occupational Stress Questionnaire    Feeling of Stress : Only a little  Social Connections: Socially Isolated (06/04/2022)   Social Connection and Isolation Panel [NHANES]    Frequency of Communication with Friends and Family: More than three times a week    Frequency of Social Gatherings with Friends and Family: More than three times a week    Attends Religious Services: Never    Database administrator or Organizations: No    Attends Banker Meetings: Never    Marital Status: Separated  Intimate Partner Violence: Not At Risk (12/26/2022)   Humiliation, Afraid, Rape, and Kick questionnaire    Fear of Current or Ex-Partner: No    Emotionally Abused: No    Physically Abused: No    Sexually Abused: No    Family History: Family History  Problem Relation Age of Onset   Breast cancer Maternal Aunt  Colon cancer Other        maternal great grandfather   Liver disease Brother        Fatty liver   HIV Brother    Hypertension Mother    Hyperlipidemia Mother    Hypertension Father    Thyroid disease Sister    Pancreatic cancer Sister    Healthy Daughter    Healthy Son    Healthy Son    Heart disease Son     Current Medications:  Current Outpatient Medications:    Accu-Chek Softclix Lancets lancets, Use as instructed to monitor glucose 1 times daily., Disp: 100 each, Rfl: 5   acetaminophen (TYLENOL) 500 MG tablet, Take 500 mg by mouth every 6 (six) hours as needed for moderate pain., Disp: , Rfl:    albuterol (PROVENTIL HFA;VENTOLIN HFA) 108 (90 Base) MCG/ACT inhaler, Inhale 1 puff into the lungs every 6 (six) hours as needed for wheezing or shortness of breath., Disp: , Rfl:    Blood Glucose Monitoring Suppl (ACCU-CHEK GUIDE ME) w/Device KIT, 1 Piece by Does not apply route as directed., Disp: 1 kit, Rfl: 0   BREO ELLIPTA 200-25 MCG/ACT AEPB, Inhale 1 puff into the lungs daily., Disp: , Rfl:    fluticasone (FLONASE) 50 MCG/ACT nasal spray, Place 1 spray into both nostrils daily as needed for allergies or rhinitis. , Disp: , Rfl:    glucose blood (ACCU-CHEK GUIDE) test strip, USE TO CHECK BLOOD SUGARS ONCE DAILY, Disp: 100 strip, Rfl: 1   lisinopril (ZESTRIL) 20 MG tablet, Take 10 mg by mouth daily., Disp: , Rfl:    metFORMIN (GLUCOPHAGE) 500 MG tablet, Take 1 tablet (500 mg total) by mouth 2 (two) times daily with a meal., Disp: 180 tablet, Rfl: 3   Multiple Vitamins-Minerals (ONE DAILY MULTIVIT/IRON-FREE) TABS, Take 1 tablet by mouth every evening., Disp: , Rfl:    RABEprazole (ACIPHEX) 20 MG tablet, Take 1 tablet (20 mg total) by mouth 2 (two) times daily before a meal., Disp: 180 tablet, Rfl: 3   sodium chloride (OCEAN) 0.65 % SOLN nasal spray, Place 1 spray into both nostrils as needed for congestion., Disp: , Rfl:    traMADol (ULTRAM) 50 MG tablet, Take 50-100 mg by mouth every 8  (eight) hours as needed for moderate pain., Disp: , Rfl: 3   Allergies: Allergies  Allergen Reactions   Asa [Aspirin] Anaphylaxis    Tolerates other NSAIDS   Augmentin [Amoxicillin-Pot Clavulanate] Other (See Comments)    "Retching and pass out"   Levaquin [Levofloxacin In D5w] Itching and Other (See Comments)    "body turned red"   Lovenox [Enoxaparin Sodium] Other (See Comments)    physician instructed not to use ever again. "white veins popped out"    REVIEW OF SYSTEMS:   Review of Systems  Constitutional:  Negative for chills, fatigue and fever.  HENT:   Negative for lump/mass, mouth sores, nosebleeds, sore throat and trouble swallowing.   Eyes:  Negative for eye problems.  Respiratory:  Positive for shortness of breath. Negative for cough.   Cardiovascular:  Negative for chest pain, leg swelling and palpitations.  Gastrointestinal:  Positive for vomiting. Negative for abdominal pain, constipation, diarrhea and nausea.  Genitourinary:  Negative for bladder incontinence, difficulty urinating, dysuria, frequency, hematuria and nocturia.   Musculoskeletal:  Negative for arthralgias, back pain, flank pain, myalgias and neck pain.       +arthritic pain, 7/10 severity  Skin:  Negative for itching and rash.  Neurological:  Negative  for dizziness, headaches and numbness.  Hematological:  Does not bruise/bleed easily.  Psychiatric/Behavioral:  Negative for depression, sleep disturbance and suicidal ideas. The patient is not nervous/anxious.   All other systems reviewed and are negative.    VITALS:   Blood pressure (!) 159/60, pulse 82, temperature 98.2 F (36.8 C), temperature source Oral, resp. rate 19, weight 228 lb 4.8 oz (103.6 kg), last menstrual period 09/06/2016, SpO2 95%.  Wt Readings from Last 3 Encounters:  01/30/23 228 lb 4.8 oz (103.6 kg)  01/06/23 232 lb 6.4 oz (105.4 kg)  01/02/23 234 lb 3.2 oz (106.2 kg)    Body mass index is 40.44 kg/m.   PHYSICAL EXAM:    Physical Exam Vitals and nursing note reviewed. Exam conducted with a chaperone present.  Constitutional:      Appearance: Normal appearance.  Cardiovascular:     Rate and Rhythm: Normal rate and regular rhythm.     Pulses: Normal pulses.     Heart sounds: Normal heart sounds.  Pulmonary:     Effort: Pulmonary effort is normal.     Breath sounds: Normal breath sounds.  Abdominal:     Palpations: Abdomen is soft. There is no hepatomegaly, splenomegaly or mass.     Tenderness: There is no abdominal tenderness.  Musculoskeletal:     Right lower leg: No edema.     Left lower leg: No edema.  Lymphadenopathy:     Cervical: No cervical adenopathy.     Right cervical: No superficial, deep or posterior cervical adenopathy.    Left cervical: No superficial, deep or posterior cervical adenopathy.     Upper Body:     Right upper body: No supraclavicular or axillary adenopathy.     Left upper body: No supraclavicular or axillary adenopathy.  Neurological:     General: No focal deficit present.     Mental Status: She is alert and oriented to person, place, and time.  Psychiatric:        Mood and Affect: Mood normal.        Behavior: Behavior normal.     LABS:      Latest Ref Rng & Units 01/30/2023    1:04 PM 01/02/2023    8:36 AM 12/29/2022    7:47 PM  CBC  WBC 4.0 - 10.5 K/uL 9.0  6.4  9.6   Hemoglobin 12.0 - 15.0 g/dL 8.2  7.3  7.1   Hematocrit 36.0 - 46.0 % 30.9  28.8  26.7   Platelets 150 - 400 K/uL 216  184  209       Latest Ref Rng & Units 12/29/2022    7:47 PM 12/28/2022   12:00 AM 12/27/2022    4:45 AM  CMP  Glucose 70 - 99 mg/dL 027   253   BUN 8 - 23 mg/dL 12   9   Creatinine 6.64 - 1.00 mg/dL 4.03   4.74   Sodium 259 - 145 mmol/L 136  139     137   Potassium 3.5 - 5.1 mmol/L 3.6  4.2     3.5   Chloride 98 - 111 mmol/L 104  105     104   CO2 22 - 32 mmol/L 25  24     24    Calcium 8.9 - 10.3 mg/dL 8.5  8.9     8.1   Total Protein 6.5 - 8.1 g/dL 6.8     Total  Bilirubin 0.3 - 1.2 mg/dL 0.3  Alkaline Phos 38 - 126 U/L 74  88       AST 15 - 41 U/L 35  26       ALT 0 - 44 U/L 18  15          This result is from an external source.     No results found for: "CEA1", "CEA" / No results found for: "CEA1", "CEA" No results found for: "PSA1" No results found for: "CAN199" No results found for: "CAN125"  Lab Results  Component Value Date   TOTALPROTELP 6.4 01/02/2023   TOTALPROTELP 6.3 01/02/2023   ALBUMINELP 3.3 01/02/2023   A1GS 0.3 01/02/2023   A2GS 0.8 01/02/2023   BETS 1.2 01/02/2023   GAMS 0.9 01/02/2023   MSPIKE Not Observed 01/02/2023   SPEI Comment 01/02/2023   Lab Results  Component Value Date   TIBC 530 (H) 12/26/2022   TIBC 479 (H) 12/25/2022   TIBC 459 (H) 05/01/2022   FERRITIN 3 (L) 12/26/2022   FERRITIN 3 (L) 12/25/2022   FERRITIN 7 (L) 05/01/2022   IRONPCTSAT 2 (L) 12/26/2022   IRONPCTSAT 3 (L) 12/25/2022   IRONPCTSAT 7 (L) 05/01/2022   Lab Results  Component Value Date   LDH 138 01/02/2023     STUDIES:   US LIVER DOPPLER LIMITED (PV OR SINGLE ART/VEIN)  Result Date: 01/12/2023 CLINICAL DATA:  Cirrhosis Abnormal right upper quadrant ultrasound EXAM: DUPLEX ULTRASOUND OF LIVER TECHNIQUE: Color and duplex Doppler ultrasound was performed to evaluate the portal vein. COMPARISON:  Right upper quadrant ultrasound 12/31/2022 FINDINGS: Liver: Parenchymal echogenicity is heterogeneous. Nodular hepatic contours consistent with cirrhosis. No focal hepatic lesion. Main Portal Vein size: 1.1 cm Portal Vein Velocities Main Prox:  45 cm/sec Main Mid: 57 cm/sec Main Dist:  34 cm/sec Right: 36 cm/sec Left: 31 cm/sec IMPRESSION: Patent portal vein with appropriate direction of flow. No evidence of portal vein thrombus. Electronically Signed   By: Acquanetta Belling M.D.   On: 01/12/2023 10:40   MM 3D SCREENING MAMMOGRAM BILATERAL BREAST  Result Date: 01/06/2023 CLINICAL DATA:  Screening. EXAM: DIGITAL SCREENING BILATERAL MAMMOGRAM  WITH TOMOSYNTHESIS AND CAD TECHNIQUE: Bilateral screening digital craniocaudal and mediolateral oblique mammograms were obtained. Bilateral screening digital breast tomosynthesis was performed. The images were evaluated with computer-aided detection. COMPARISON:  Previous exam(s). ACR Breast Density Category a: The breasts are almost entirely fatty. FINDINGS: There are no findings suspicious for malignancy. IMPRESSION: No mammographic evidence of malignancy. A result letter of this screening mammogram will be mailed directly to the patient. RECOMMENDATION: Screening mammogram in one year. (Code:SM-B-01Y) BI-RADS CATEGORY  1: Negative. Electronically Signed   By: Frederico Hamman M.D.   On: 01/06/2023 12:07

## 2023-01-31 DIAGNOSIS — D638 Anemia in other chronic diseases classified elsewhere: Secondary | ICD-10-CM | POA: Diagnosis not present

## 2023-01-31 DIAGNOSIS — E1165 Type 2 diabetes mellitus with hyperglycemia: Secondary | ICD-10-CM | POA: Diagnosis not present

## 2023-01-31 DIAGNOSIS — I10 Essential (primary) hypertension: Secondary | ICD-10-CM | POA: Diagnosis not present

## 2023-01-31 DIAGNOSIS — J449 Chronic obstructive pulmonary disease, unspecified: Secondary | ICD-10-CM | POA: Diagnosis not present

## 2023-02-03 ENCOUNTER — Encounter: Payer: Self-pay | Admitting: *Deleted

## 2023-02-03 ENCOUNTER — Telehealth: Payer: Self-pay | Admitting: *Deleted

## 2023-02-03 NOTE — Patient Outreach (Signed)
  Care Coordination   Health Equity Plan Follow Up Visit Note   02/03/2023 Name: Wendy Kramer MRN: [id number] DOB: [birth year]  Wendy Kramer is a 62 y.o. year old female who sees [name], Wayland Salinas, MD for primary care. I spoke with  Wendy Kramer by phone today.  What matters to the patients health and wellness today?  Patient called to reschedule telephone follow-up on 02/06/23 because she is scheduled for an iron infusion.     Goals Addressed             This Visit's Progress    Manage Anemia   On track    Care Coordination Goals: Patient will keep appointment for iron infusion on 02/06/23 Patient will monitor for any symptoms of anemia or bleeding and will reach out to provider if any symptoms noted Patient will keep appointment for endoscopy with Dr Jena Gauss on 02/12/23 Patient will eat iron rich foods with vitamin C to help with absorption Patient will reach out to RN Care Management Coordinator [phone] with any resource or care coordination needs        SDOH assessments and interventions completed:  Yes  SDOH Interventions Today    Flowsheet Row Most Recent Value  SDOH Interventions   Transportation Interventions Patient Resources (Friends/Family), Intervention Not Indicated, Payor Benefit  Health Literacy Interventions Intervention Not Indicated        Care Coordination Interventions:  Yes, provided  Interventions Today    Flowsheet Row Most Recent Value  Chronic Disease   Chronic disease during today's visit Other  [anemia]  General Interventions   General Interventions Discussed/Reviewed General Interventions Discussed, General Interventions Reviewed, Labs, Doctor Visits  Labs --  [CBC]  Doctor Visits Discussed/Reviewed Doctor Visits Discussed, Specialist, Doctor Visits Reviewed  PCP/Specialist Visits Compliance with follow-up visit  [iron infusion scheduled for 02/06/23. Endoscopy scheduled for 02/12/23.]  Education Interventions   Education  Provided Provided Education  Provided Verbal Education On Labs, When to see the doctor, Nutrition  Labs Reviewed --  [01/30/23 Hgb 8.2. Improved from 7.3 on 01/02/23.]  Nutrition Interventions   Nutrition Discussed/Reviewed Nutrition Discussed, Nutrition Reviewed, Adding fruits and vegetables  [iron rich foods paired with vitamin C for absorption]       Follow up plan: Follow up call scheduled for 02/17/23    Encounter Outcome:  Patient Visit Completed   Demetrios Loll, RN, BSN Care Management Coordinator [facility]  [facility] Direct Dial: [phone] Main #: [phone]

## 2023-02-05 ENCOUNTER — Telehealth: Payer: Self-pay

## 2023-02-05 NOTE — Telephone Encounter (Signed)
.  Transition Care Management Follow-up Telephone Call Date of discharge and from where: Wendy Kramer 9/30 How have you been since you were released from the hospital? Doing ok and following up with providers  Any questions or concerns? No  Items Reviewed: Did the pt receive and understand the discharge instructions provided? Yes  Medications obtained and verified? Yes  Other? Yes  Any new allergies since your discharge? No  Dietary orders reviewed? No Do you have support at home? Yes    Follow up appointments reviewed:  PCP Hospital f/u appt confirmed? Yes  Scheduled to see  on  @ . Specialist Hospital f/u appt confirmed? Yes  Scheduled to see  on  @ . Are transportation arrangements needed? No  If their condition worsens, is the pt aware to call PCP or go to the Emergency Dept.? Yes Was the patient provided with contact information for the PCP's office or ED? Yes Was to pt encouraged to call back with questions or concerns? Yes

## 2023-02-06 ENCOUNTER — Inpatient Hospital Stay: Payer: Medicare Other | Attending: Hematology

## 2023-02-06 ENCOUNTER — Encounter: Payer: Medicare Other | Admitting: *Deleted

## 2023-02-06 ENCOUNTER — Emergency Department (HOSPITAL_COMMUNITY): Payer: Medicare Other

## 2023-02-06 ENCOUNTER — Encounter (HOSPITAL_COMMUNITY): Payer: Self-pay | Admitting: Emergency Medicine

## 2023-02-06 ENCOUNTER — Emergency Department (HOSPITAL_COMMUNITY)
Admission: EM | Admit: 2023-02-06 | Discharge: 2023-02-06 | Disposition: A | Discharge status: Home / Self Care | Payer: Medicare Other | Attending: Emergency Medicine | Admitting: Emergency Medicine

## 2023-02-06 ENCOUNTER — Other Ambulatory Visit: Payer: Self-pay

## 2023-02-06 DIAGNOSIS — R0789 Other chest pain: Secondary | ICD-10-CM | POA: Diagnosis not present

## 2023-02-06 DIAGNOSIS — R000 Tachycardia, unspecified: Secondary | ICD-10-CM | POA: Diagnosis not present

## 2023-02-06 DIAGNOSIS — I2510 Atherosclerotic heart disease of native coronary artery without angina pectoris: Secondary | ICD-10-CM | POA: Diagnosis not present

## 2023-02-06 DIAGNOSIS — K769 Liver disease, unspecified: Secondary | ICD-10-CM | POA: Diagnosis not present

## 2023-02-06 DIAGNOSIS — K7689 Other specified diseases of liver: Secondary | ICD-10-CM | POA: Diagnosis not present

## 2023-02-06 DIAGNOSIS — J4541 Moderate persistent asthma with (acute) exacerbation: Secondary | ICD-10-CM | POA: Insufficient documentation

## 2023-02-06 DIAGNOSIS — F17210 Nicotine dependence, cigarettes, uncomplicated: Secondary | ICD-10-CM | POA: Diagnosis not present

## 2023-02-06 DIAGNOSIS — R002 Palpitations: Secondary | ICD-10-CM | POA: Diagnosis not present

## 2023-02-06 DIAGNOSIS — R9389 Abnormal findings on diagnostic imaging of other specified body structures: Secondary | ICD-10-CM

## 2023-02-06 DIAGNOSIS — Z7951 Long term (current) use of inhaled steroids: Secondary | ICD-10-CM | POA: Insufficient documentation

## 2023-02-06 DIAGNOSIS — R531 Weakness: Secondary | ICD-10-CM | POA: Diagnosis not present

## 2023-02-06 DIAGNOSIS — I700 Atherosclerosis of aorta: Secondary | ICD-10-CM | POA: Diagnosis not present

## 2023-02-06 DIAGNOSIS — Z7952 Long term (current) use of systemic steroids: Secondary | ICD-10-CM | POA: Insufficient documentation

## 2023-02-06 DIAGNOSIS — R0602 Shortness of breath: Secondary | ICD-10-CM | POA: Diagnosis not present

## 2023-02-06 DIAGNOSIS — R079 Chest pain, unspecified: Secondary | ICD-10-CM

## 2023-02-06 LAB — CBC WITH DIFFERENTIAL/PLATELET
Abs Immature Granulocytes: 0.02 10*3/uL (ref 0.00–0.07)
Basophils Absolute: 0 10*3/uL (ref 0.0–0.1)
Basophils Relative: 1 %
Eosinophils Absolute: 0.2 10*3/uL (ref 0.0–0.5)
Eosinophils Relative: 3 %
HCT: 31.5 % — ABNORMAL LOW (ref 36.0–46.0)
Hemoglobin: 8.4 g/dL — ABNORMAL LOW (ref 12.0–15.0)
Immature Granulocytes: 0 %
Lymphocytes Relative: 23 %
Lymphs Abs: 1.5 10*3/uL (ref 0.7–4.0)
MCH: 18.3 pg — ABNORMAL LOW (ref 26.0–34.0)
MCHC: 26.7 g/dL — ABNORMAL LOW (ref 30.0–36.0)
MCV: 68.5 fL — ABNORMAL LOW (ref 80.0–100.0)
Monocytes Absolute: 0.6 10*3/uL (ref 0.1–1.0)
Monocytes Relative: 10 %
Neutro Abs: 4.2 10*3/uL (ref 1.7–7.7)
Neutrophils Relative %: 63 %
Platelets: 170 10*3/uL (ref 150–400)
RBC: 4.6 MIL/uL (ref 3.87–5.11)
RDW: 21.1 % — ABNORMAL HIGH (ref 11.5–15.5)
WBC: 6.5 10*3/uL (ref 4.0–10.5)
nRBC: 0 % (ref 0.0–0.2)

## 2023-02-06 LAB — URINALYSIS, ROUTINE W REFLEX MICROSCOPIC
Bilirubin Urine: NEGATIVE
Glucose, UA: NEGATIVE mg/dL
Hgb urine dipstick: NEGATIVE
Ketones, ur: NEGATIVE mg/dL
Leukocytes,Ua: NEGATIVE
Nitrite: NEGATIVE
Protein, ur: 30 mg/dL — AB
Specific Gravity, Urine: 1.017 (ref 1.005–1.030)
pH: 5 (ref 5.0–8.0)

## 2023-02-06 LAB — COMPREHENSIVE METABOLIC PANEL
ALT: 19 U/L (ref 0–44)
AST: 37 U/L (ref 15–41)
Albumin: 3.3 g/dL — ABNORMAL LOW (ref 3.5–5.0)
Alkaline Phosphatase: 75 U/L (ref 38–126)
Anion gap: 11 (ref 5–15)
BUN: 14 mg/dL (ref 8–23)
CO2: 24 mmol/L (ref 22–32)
Calcium: 8.6 mg/dL — ABNORMAL LOW (ref 8.9–10.3)
Chloride: 101 mmol/L (ref 98–111)
Creatinine, Ser: 0.8 mg/dL (ref 0.44–1.00)
GFR, Estimated: >60 mL/min (ref >60)
Glucose, Bld: 192 mg/dL — ABNORMAL HIGH (ref 70–99)
Potassium: 4.9 mmol/L (ref 3.5–5.1)
Sodium: 136 mmol/L (ref 135–145)
Total Bilirubin: 0.6 mg/dL (ref <1.2)
Total Protein: 6.6 g/dL (ref 6.5–8.1)

## 2023-02-06 LAB — TROPONIN I (HIGH SENSITIVITY)
Troponin I (High Sensitivity): 18 ng/L — ABNORMAL HIGH (ref <18)
Troponin I (High Sensitivity): 19 ng/L — ABNORMAL HIGH (ref <18)

## 2023-02-06 LAB — BRAIN NATRIURETIC PEPTIDE: B Natriuretic Peptide: 211 pg/mL — ABNORMAL HIGH (ref 0.0–100.0)

## 2023-02-06 LAB — TSH: TSH: 1.663 u[IU]/mL (ref 0.350–4.500)

## 2023-02-06 MED ORDER — IOHEXOL 350 MG/ML SOLN
75.0000 mL | Freq: Once | INTRAVENOUS | Status: AC | PRN
  Administered 2023-02-06: 75 mL via INTRAVENOUS

## 2023-02-06 NOTE — ED Triage Notes (Signed)
Pt feels like her heart has been racing today, pt is supposed to get iron infusion here at 10am today.

## 2023-02-06 NOTE — Discharge Instructions (Addendum)
Your testing is normal, there was no signs of blood clot no signs of heart attack, lungs are clear, please follow-up closely with your family doctor, return to the ER for severe worsening symptoms.  I would recommend seeing your doctor within 2 or 3 days for recheck  Drink plenty of clear liquids  The CT scan did show that you have some nodules, this needs to be followed up with your doctor so please let them obtain your results this week and follow-up the results as needed.  They can obtain your results and discuss them with you in the office, there is nothing emergent that is causing your symptoms today

## 2023-02-06 NOTE — ED Provider Notes (Signed)
[place] EMERGENCY DEPARTMENT AT [facility] Provider Note   CSN: [id number] Arrival date & time: 02/06/23  4098     History  Chief Complaint  Patient presents with   Chest Pain    Wendy Kramer is a 62 y.o. female.  HPI   62 year old female, history of multiple medical problems including a history of some reactive airway disease, history of some liver disease, she reports that she has low iron and anemia and requires iron transfusions, recently admitted for blood transfusions and had a colonoscopy, presents today stating that yesterday she started to feel ill with generally fatigued and weak, thought she was dehydrated so she drank lots of water, when she woke up this morning after urinating multiple times last night she felt like she was having shortness of breath and lightheadedness when she would stand.  Paramedics found the patient have a heart rate in the 110 range and a regular tachycardia, they did orthostatic vital signs and found no signs of hypotension, the patient has not had any focal weakness or numbness, she denies any diarrhea or bloody stools, she has had no abdominal pain or chest pain.  Home Medications Prior to Admission medications   Medication Sig Start Date End Date Taking? Authorizing Provider  Accu-Chek Softclix Lancets lancets Use as instructed to monitor glucose 1 times daily. 11/06/22   Dani Gobble, NP  acetaminophen (TYLENOL) 500 MG tablet Take 500 mg by mouth every 6 (six) hours as needed for moderate pain.    [provider]  albuterol (PROVENTIL HFA;VENTOLIN HFA) 108 (90 Base) MCG/ACT inhaler Inhale 1 puff into the lungs every 6 (six) hours as needed for wheezing or shortness of breath.    [provider]  Blood Glucose Monitoring Suppl (ACCU-CHEK GUIDE ME) w/Device KIT 1 Piece by Does not apply route as directed. 11/02/19   Roma Kayser, MD  BREO ELLIPTA 200-25 MCG/ACT AEPB Inhale 1 puff into the lungs daily.  12/04/22   [provider]  fluticasone (FLONASE) 50 MCG/ACT nasal spray Place 1 spray into both nostrils daily as needed for allergies or rhinitis.     [provider]  glucose blood (ACCU-CHEK GUIDE) test strip USE TO CHECK BLOOD SUGARS ONCE DAILY 12/09/22   Dani Gobble, NP  lisinopril (ZESTRIL) 20 MG tablet Take 10 mg by mouth daily.    [provider]  metFORMIN (GLUCOPHAGE) 500 MG tablet Take 1 tablet (500 mg total) by mouth 2 (two) times daily with a meal. 01/06/23   Reardon, Freddi Starr, NP  Multiple Vitamins-Minerals (ONE DAILY MULTIVIT/IRON-FREE) TABS Take 1 tablet by mouth every evening.    [provider]  RABEprazole (ACIPHEX) 20 MG tablet Take 1 tablet (20 mg total) by mouth 2 (two) times daily before a meal. 12/23/22   Mahon, Frederik Schmidt, NP  sodium chloride (OCEAN) 0.65 % SOLN nasal spray Place 1 spray into both nostrils as needed for congestion.    [provider]  traMADol (ULTRAM) 50 MG tablet Take 50-100 mg by mouth every 8 (eight) hours as needed for moderate pain. 09/20/16   [provider]      Allergies    Asa [aspirin], Augmentin [amoxicillin-pot clavulanate], Levaquin [levofloxacin in d5w], and Lovenox [enoxaparin sodium]    Review of Systems   Review of Systems  All other systems reviewed and are negative.   Physical Exam Updated Vital Signs BP (!) 123/51   Pulse 85   Temp 98.4 F (36.9 C) (Oral)  Resp 19   Ht 1.6 m (5\' 3" )   Wt 103 kg   LMP 09/06/2016   SpO2 94%   BMI 40.22 kg/m  Physical Exam Vitals and nursing note reviewed.  Constitutional:      General: She is not in acute distress.    Appearance: She is well-developed.  HENT:     Head: Normocephalic and atraumatic.     Mouth/Throat:     Pharynx: No oropharyngeal exudate.  Eyes:     General: No scleral icterus.       Right eye: No discharge.        Left eye: No discharge.     Conjunctiva/sclera: Conjunctivae normal.     Pupils: Pupils are  equal, round, and reactive to light.  Neck:     Thyroid: No thyromegaly.     Vascular: No JVD.  Cardiovascular:     Rate and Rhythm: Normal rate and regular rhythm.     Heart sounds: Normal heart sounds. No murmur heard.    No friction rub. No gallop.  Pulmonary:     Effort: Pulmonary effort is normal. No respiratory distress.     Breath sounds: Normal breath sounds. No wheezing or rales.  Abdominal:     General: Bowel sounds are normal. There is no distension.     Palpations: Abdomen is soft. There is no mass.     Tenderness: There is no abdominal tenderness.  Musculoskeletal:        General: No tenderness. Normal range of motion.     Cervical back: Normal range of motion and neck supple.  Lymphadenopathy:     Cervical: No cervical adenopathy.  Skin:    General: Skin is warm and dry.     Findings: No erythema or rash.  Neurological:     Mental Status: She is alert.     Coordination: Coordination normal.  Psychiatric:        Behavior: Behavior normal.     ED Results / Procedures / Treatments   Labs (all labs ordered are listed, but only abnormal results are displayed) Labs Reviewed  CBC WITH DIFFERENTIAL/PLATELET - Abnormal; Notable for the following components:      Result Value   Hemoglobin 8.4 (*)    HCT 31.5 (*)    MCV 68.5 (*)    MCH 18.3 (*)    MCHC 26.7 (*)    RDW 21.1 (*)    All other components within normal limits  COMPREHENSIVE METABOLIC PANEL - Abnormal; Notable for the following components:   Glucose, Bld 192 (*)    Calcium 8.6 (*)    Albumin 3.3 (*)    All other components within normal limits  BRAIN NATRIURETIC PEPTIDE - Abnormal; Notable for the following components:   B Natriuretic Peptide 211.0 (*)    All other components within normal limits  URINALYSIS, ROUTINE W REFLEX MICROSCOPIC - Abnormal; Notable for the following components:   APPearance HAZY (*)    Protein, ur 30 (*)    Bacteria, UA RARE (*)    All other components within normal  limits  TROPONIN I (HIGH SENSITIVITY) - Abnormal; Notable for the following components:   Troponin I (High Sensitivity) 18 (*)    All other components within normal limits  TROPONIN I (HIGH SENSITIVITY) - Abnormal; Notable for the following components:   Troponin I (High Sensitivity) 19 (*)    All other components within normal limits  TSH    EKG EKG Interpretation Date/Time:  Thursday February 06 2023 10:21:24 EST Ventricular Rate:  96 PR Interval:  144 QRS Duration:  80 QT Interval:  353 QTC Calculation: 447 R Axis:   60  Text Interpretation: Sinus rhythm Consider left atrial enlargement since last tracing no significant change Confirmed by Eber Hong ([id number]) on 02/06/2023 10:37:39 AM  Radiology CT Angio Chest PE W and/or Wo Contrast  Result Date: 02/06/2023 CLINICAL DATA:  Heart racing EXAM: CT ANGIOGRAPHY CHEST WITH CONTRAST TECHNIQUE: Multidetector CT imaging of the chest was performed using the standard protocol during bolus administration of intravenous contrast. Multiplanar CT image reconstructions and MIPs were obtained to evaluate the vascular anatomy. RADIATION DOSE REDUCTION: This exam was performed according to the departmental dose-optimization program which includes automated exposure control, adjustment of the mA and/or kV according to patient size and/or use of iterative reconstruction technique. CONTRAST:  75mL OMNIPAQUE IOHEXOL 350 MG/ML SOLN COMPARISON:  CTA chest 03/10/2019.  X-ray earlier 02/06/2023. FINDINGS: Cardiovascular: Heart is nonenlarged. Coronary artery calcifications are seen. No significant pericardial effusion. The thoracic aorta has a normal course and caliber with scattered vascular calcifications. No segmental or larger pulmonary embolism identified. There is some breathing motion identified which can limit evaluation of small and peripheral emboli. Mediastinum/Nodes: No specific abnormal lymph node enlargement identified in the axillary region, hilum  or mediastinum. There are some small posterior lower mediastinal nodes identified such as series 4, image 62 but these are less than a cm in short axis and not pathologic by size criteria. Normal caliber thoracic esophagus. Preserved thyroid gland. Lungs/Pleura: Calcified right-sided lung nodule identified on series 6, image 73 consistent with old granulomatous disease. No consolidation, pneumothorax or effusion. There is some linear opacity lung bases likely scar or atelectasis. Upper Abdomen: Nodular liver identified with some varices and splenomegaly. Also some upper abdominal retroperitoneal nodal enlargement. These are increased from prior CT of 2020. Recommend further workup when appropriate such as abdominal CT scan. Musculoskeletal: Scattered degenerative changes along the spine. There is compression deformity L1. Level of compression is increased from a spine x-ray of May 2023. Please correlate with symptoms and history. Review of the MIP images confirms the above findings. IMPRESSION: Breathing motion.  No segmental or larger pulmonary embolism. Nodular liver with varices and splenomegaly consistent with chronic liver disease. There are some increasing nodes identified in the upper abdomen and lower mediastinum of uncertain etiology. Please correlate for the type liver disease. Further workup of the abdomen and pelvis when clinically appropriate. Increasing compression deformity of L1 compared to an x-ray of 2023. Please correlate for any known history or dedicated evaluation when appropriate Aortic Atherosclerosis (ICD10-I70.0). Electronically Signed   By: Karen Kays M.D.   On: 02/06/2023 15:25   DG Chest Port 1 View  Result Date: 02/06/2023 CLINICAL DATA:  Shortness of breath. EXAM: PORTABLE CHEST 1 VIEW COMPARISON:  Chest radiograph dated January 10, 2021. FINDINGS: The heart size and mediastinal contours are within normal limits. Aortic atherosclerosis. Overlapping telemetry wires. Mild bilateral  basilar predominant interstitial opacities. No pneumothorax or sizable pleural effusion. Suture anchors in the right humeral head. No acute osseous abnormality. IMPRESSION: Bilateral basilar predominant interstitial opacities may represent pulmonary vascular congestion or atypical infection. Electronically Signed   By: Hart Robinsons M.D.   On: 02/06/2023 12:10    Procedures Procedures    Medications Ordered in ED Medications  iohexol (OMNIPAQUE) 350 MG/ML injection 75 mL (75 mLs Intravenous Contrast Given 02/06/23 1332)    ED Course/ Medical Decision Making/ A&P  Medical Decision Making Amount and/or Complexity of Data Reviewed Labs: ordered. Radiology: ordered. ECG/medicine tests: ordered.  Risk Prescription drug management.    This patient presents to the ED for concern of a feeling of shortness of breath, lightheadedness, sweating, general fatigue, this involves an extensive number of treatment options, and is a complaint that carries with it a high risk of complications and morbidity.  The differential diagnosis includes infection, electrolyte abnormalities (patient has recently had hypomagnesemia), consider renal failure, heart arrhythmia, dehydration, infection or possibly ischemia though seems unlikely   Co morbidities that complicate the patient evaluation  Obesity, liver failure   Additional history obtained:  Additional history obtained from medical record External records from outside source obtained and reviewed including prior ideations including recent admission to the hospital, was admitted September 26, had colonoscopy   Lab Tests:  I Ordered, and personally interpreted labs.  The pertinent results include: CBC with improving hemoglobin up to 8.4, that is the highest that it has been since September, metabolic panel reassuring without renal dysfunction, liver function reasonable TSH normal and 2 troponins without any  elevation.  BNP was minimally elevated at 200, x-ray and CT scan ordered, urinalysis negative for infection   Imaging Studies ordered:  I ordered imaging studies including chest without findings I independently visualized and interpreted imaging which showed no acute findings, CT scan angiogram performed showing no signs of pulmonary embolism, some liver and splenic findings consistent with chronic liver disease, some abnormal nodules of unknown etiology I agree with the radiologist interpretation   Cardiac Monitoring: / EKG:  The patient was maintained on a cardiac monitor.  I personally viewed and interpreted the cardiac monitored which showed an underlying rhythm of: Normal sinus rhythm, heart rate improved, no tachycardia    Problem List / ED Course / Critical interventions / Medication management  The patient did extremely well, vital signs are completely normal, she was only in a mild sinus tachycardia initially but resolved and is now in the 80s without any symptoms.  Blood pressure normal, no hypoxia, no pulmonary embolism, no signs of acute ischemia with negative troponins which did not have no delta change  I have reviewed the patients home medicines and have made adjustments as needed   Social Determinants of Health:  none   Test / Admission - Considered:  Considered admission but the patient is asymptomatic at the time of discharge with workup for other causes which has been unremarkable, she understands her results and indications for return         Final Clinical Impression(s) / ED Diagnoses Final diagnoses:  Chest pain, unspecified type  Palpitations  Abnormal CT scan    Rx / DC Orders ED Discharge Orders     None         Eber Hong, MD 02/06/23 1537

## 2023-02-07 NOTE — Pre-Procedure Instructions (Signed)
  RE: ED visit Received: Today [name], [name], MD  Elsie Amis, RN Chart reviewed.  I am not sure what called that episode.  Workup negative for major entities.  I think we can proceed on Monday       Previous Messages    ----- Message ----- From: Elsie Amis, RN Sent: 02/07/2023  11:12 AM EST To: Elinor Dodge, LPN; Armstead Peaks, CMA; * Subject: ED visit                                      Good morning to you all! Dr Jena Gauss, will you review Wendy Kramer's chart and make sure you are okay with proceeding with EGD 11/13? Shw was in the ED with CP and SOB yesterday. She was D/C 'd home. I just want to make sure you are comfortable with proceeding without further testing. Thank you!

## 2023-02-07 NOTE — Pre-Procedure Instructions (Signed)
Patient was in Ed yesterday, 02/06/2023 and has EGD scheduled on 02/12/2023. Dr Jena Gauss messaged to make sure he is okay with proceeding.

## 2023-02-10 ENCOUNTER — Telehealth: Payer: Self-pay | Admitting: *Deleted

## 2023-02-10 ENCOUNTER — Encounter (HOSPITAL_COMMUNITY)
Admission: RE | Admit: 2023-02-10 | Discharge: 2023-02-10 | Disposition: A | Discharge status: Home / Self Care | Payer: Medicare Other | Source: Ambulatory Visit | Attending: Internal Medicine | Admitting: Internal Medicine

## 2023-02-10 DIAGNOSIS — R918 Other nonspecific abnormal finding of lung field: Secondary | ICD-10-CM | POA: Diagnosis not present

## 2023-02-10 DIAGNOSIS — J449 Chronic obstructive pulmonary disease, unspecified: Secondary | ICD-10-CM | POA: Diagnosis not present

## 2023-02-10 DIAGNOSIS — I10 Essential (primary) hypertension: Secondary | ICD-10-CM | POA: Diagnosis not present

## 2023-02-10 DIAGNOSIS — E1165 Type 2 diabetes mellitus with hyperglycemia: Secondary | ICD-10-CM | POA: Diagnosis not present

## 2023-02-10 DIAGNOSIS — D509 Iron deficiency anemia, unspecified: Secondary | ICD-10-CM | POA: Diagnosis not present

## 2023-02-10 NOTE — Telephone Encounter (Signed)
Pt left vm wanting to reschedule upcoming procedure.  [facility]   ED visit Received: 3 days ago Elsie Amis, RN  [name], [name], MD Cc: Elinor Dodge, LPN; Marlowe Shores, LPN; Armstead Peaks, CMA Good morning to you all! Dr Jena Gauss, will you review Wendy Kramer's chart and make sure you are okay with proceeding with EGD 11/13? Shw was in the ED with CP and SOB yesterday. She was D/C 'd home. I just want to make sure you are comfortable with proceeding without further testing. Thank you!

## 2023-02-10 NOTE — Pre-Procedure Instructions (Signed)
Attempted pre-op phonecall. Left VM for her to call us back. 

## 2023-02-11 ENCOUNTER — Encounter: Payer: Self-pay | Admitting: *Deleted

## 2023-02-11 ENCOUNTER — Encounter: Payer: Self-pay | Admitting: Internal Medicine

## 2023-02-11 NOTE — Telephone Encounter (Signed)
Pt has been rescheduled until 03/05/23. Updated instructions mailed to pt.

## 2023-02-11 NOTE — Pre-Procedure Instructions (Signed)
Attempted pre-op phonecall. Left VM for her to call us back. 

## 2023-02-17 ENCOUNTER — Encounter: Payer: Self-pay | Admitting: *Deleted

## 2023-02-17 ENCOUNTER — Ambulatory Visit: Payer: Self-pay | Admitting: *Deleted

## 2023-02-17 NOTE — Patient Outreach (Signed)
Care Coordination   Health Equity Plan Follow Up Visit Note   02/17/2023 Name: Xymena Mccalman MRN: [id number] DOB: 08-29-1960  Virda Titus is a 62 y.o. year old female who sees [name], Wayland Salinas, MD for primary care. I spoke with  [name] by phone today.  What matters to the patients health and wellness today?  Managing anemia and diabetes    Goals Addressed             This Visit's Progress    Manage Anemia   On track    Care Coordination Goals: Patient will keep appointment for iron infusion on 02/19/23 Patient will monitor for any symptoms of anemia or bleeding and will reach out to provider if any symptoms noted Patient will keep appointment for endoscopy with Dr Jena Gauss on 03/05/23 Patient will eat iron rich foods with vitamin C to help with absorption Patient will reach out to RN Care Management Coordinator [phone] with any resource or care coordination needs     Over the next 6 months patient will decrease A1C to < 7 (Health Equity Plan)   On track    Care Coordination Goals: Patient will follow-up with endocrinologist every 3 months or as recommended Endocrinology f/u scheduled for 04/08/23 Patient will continue to monitor and record blood sugar daily and as needed with glucometer, and will call endocrinologist with any readings less than 70 or for 3 readings in a row over 200 Patient will take blood sugar log and meter to provider visits for review Patient will follow a carb modified diet with 3 meals per day that include 30GM of carbohydrates, lean proteins, and healthy fats and less than 2 snacks per day with less than 15GM of carbohydrates  Patient will increase physical activity as tolerated (limited by joint pain) with an ultimate goal of at least 150 minutes of moderate activity per week Patient will continue to monitor and record blood pressure twice per day, since HTN is a comorbidity of DM, and reach out to PCP with any readings outside of  recommended range Patient will reach out to [facility] Coordinator [phone] with any care coordination or resource needs        SDOH assessments and interventions completed:  Yes  SDOH Interventions Today    Flowsheet Row Most Recent Value  SDOH Interventions   Transportation Interventions Patient Resources (Friends/Family), Payor Benefit  Physical Activity Interventions Patient Declined        Care Coordination Interventions:  Yes, provided  Interventions Today    Flowsheet Row Most Recent Value  Chronic Disease   Chronic disease during today's visit Diabetes, Other  [anemia]  General Interventions   General Interventions Discussed/Reviewed General Interventions Discussed, General Interventions Reviewed, Labs, Doctor Visits, Durable Medical Equipment (DME)  Labs Hgb A1c every 3 months  [CBC]  Doctor Visits Discussed/Reviewed Doctor Visits Discussed, Doctor Visits Reviewed, Specialist, Annual Wellness Visits, PCP  [facility] ED visit on 02/06/23 for chest pain. Symptoms have resolved.]  Durable Medical Equipment (DME) BP Cuff, Glucomoter  [blood sugar was 141 this morning. Has noticed blood sugar readings have been improving.]  PCP/Specialist Visits Compliance with follow-up visit  [iron infusion at [facility] on 02/19/23. Endoscopy at [facility] on 03/05/23. Endocrinology on 04/08/23.]  Exercise Interventions   Exercise Discussed/Reviewed Physical Activity  Physical Activity Discussed/Reviewed Physical Activity Discussed, Physical Activity Reviewed  [Able to perform ADLs independently. Exercise is limited by joint pain. Encouraged to increase as tolerated with an ultimate goal of at least 150  minutes per week]  Education Interventions   Education Provided Provided Education  Provided Verbal Education On When to see the doctor, Nutrition, Labs, Blood Sugar Monitoring, Medication, Exercise  Labs Reviewed Hgb A1c  [hemoglobin 8.4 on 02/06/23. A1C 9.4% on 12/26/22]  Nutrition  Interventions   Nutrition Discussed/Reviewed Nutrition Discussed, Nutrition Reviewed, Carbohydrate meal planning, Increasing proteins, Adding fruits and vegetables, Fluid intake, Portion sizes, Decreasing sugar intake  [iron rich foods. 3 meals per day with 30GM of CHO and up to 2 snacks per day, if needed, with less than 15 GM of CHO]  Pharmacy Interventions   Pharmacy Dicussed/Reviewed Pharmacy Topics Discussed, Pharmacy Topics Reviewed, Medications and their functions  [taking medication as directed]  Safety Interventions   Safety Discussed/Reviewed Safety Discussed, Safety Reviewed, Fall Risk, Home Safety  Home Safety Assistive Devices       Follow up plan: Follow up call scheduled for 03/11/23    Encounter Outcome:  Patient Visit Completed   Demetrios Loll, RN, BSN Care Management Coordinator [facility]  [facility] Direct Dial: [phone] Main #: [phone]

## 2023-02-19 ENCOUNTER — Inpatient Hospital Stay: Payer: Medicare Other

## 2023-02-26 ENCOUNTER — Telehealth: Payer: Self-pay

## 2023-02-26 NOTE — Telephone Encounter (Signed)
Transition Care Management Unsuccessful Follow-up Telephone Call  Date of discharge and from where:  Wendy Kramer 11/7  Attempts:  1st Attempt  Reason for unsuccessful TCM follow-up call:  No answer/busy   Wendy Kramer  [facility] Guide, Phone: [phone] Website: [email or web address]

## 2023-02-28 ENCOUNTER — Telehealth: Payer: Self-pay

## 2023-02-28 NOTE — Telephone Encounter (Signed)
Transition Care Management Unsuccessful Follow-up Telephone Call  Date of discharge and from where:  Wendy Kramer 11/7  Attempts:  2nd Attempt  Reason for unsuccessful TCM follow-up call:  No answer/busy   [name]  [facility], [facility] Guide, Phone: [phone] Website: [email or web address]

## 2023-03-03 ENCOUNTER — Telehealth: Payer: Self-pay | Admitting: *Deleted

## 2023-03-03 ENCOUNTER — Encounter (HOSPITAL_COMMUNITY)
Admission: RE | Admit: 2023-03-03 | Discharge: 2023-03-03 | Disposition: A | Discharge status: Home / Self Care | Payer: Medicare Other | Source: Ambulatory Visit | Attending: Internal Medicine | Admitting: Internal Medicine

## 2023-03-03 NOTE — Telephone Encounter (Signed)
Pt left vm wanting to reschedule her procedure until end of January.   Advised pt that will have to wait on schedule for provider for January. Will call to schedule once we get it.  EGD w/Dr.[name], ASA 3

## 2023-03-03 NOTE — Telephone Encounter (Signed)
-----   Message from Wendy Kramer sent at 03/03/2023  7:52 AM EST ----- This patient just called to cancel her procedure.  I advised her to call your office to reschedule.  Thanks,

## 2023-03-05 ENCOUNTER — Encounter (HOSPITAL_COMMUNITY): Admission: RE | Payer: Self-pay | Source: Home / Self Care

## 2023-03-05 ENCOUNTER — Ambulatory Visit (HOSPITAL_COMMUNITY): Admission: RE | Admit: 2023-03-05 | Payer: Medicare Other | Source: Home / Self Care | Admitting: Internal Medicine

## 2023-03-05 SURGERY — ESOPHAGOGASTRODUODENOSCOPY (EGD) WITH PROPOFOL
Anesthesia: Monitor Anesthesia Care

## 2023-03-05 NOTE — Telephone Encounter (Signed)
Called pt to get her schedule, unable to leave message due to vm being full.

## 2023-03-07 ENCOUNTER — Encounter: Payer: Self-pay | Admitting: *Deleted

## 2023-03-07 NOTE — Telephone Encounter (Signed)
Pt has been scheduled for 04/30/23, updated instructions mailed to pt

## 2023-03-11 ENCOUNTER — Encounter: Payer: Self-pay | Admitting: *Deleted

## 2023-03-11 ENCOUNTER — Ambulatory Visit: Payer: Self-pay | Admitting: *Deleted

## 2023-03-11 DIAGNOSIS — E1165 Type 2 diabetes mellitus with hyperglycemia: Secondary | ICD-10-CM

## 2023-03-11 NOTE — Patient Outreach (Signed)
Health Equity Plan Care Coordination   Follow Up Visit Note   03/11/2023 Name: Wendy Kramer MRN: [id number] DOB: 1960/11/23  Wendy Kramer is a 62 y.o. year old female who sees [name], Wayland Salinas, MD for primary care. I spoke with  [name] by phone today.  What matters to the patients health and wellness today?  Scheduling specialty visits and repairing heating system.    Goals Addressed             This Visit's Progress    Care Coordination Needs       Care Management Goals: Patient will reach out to the following specialists to schedule an appointment at her convenience. Schedule diabetic eye exam Dermatologist. Dr Scharlene Gloss office in [place] for skin check [phone] Orthopedist. Dr Romeo Apple in [place] for neck/back pain with abnormal imaging [phone] Pulmonologist. May need a referral. [facility] in [place] [phone] Patient will talk with [facility] Re: lack of heat Using space heaters on coldest days/nights because her heating system is broken Patient will reach out to RN Care Manager at [phone] if she needs assistance with scheduling or care coordination.      Manage Anemia   On track    Care Coordination Goals: Patient will monitor for any symptoms of anemia or bleeding and will reach out to provider if any symptoms noted Patient will have repeat lab work done (current orders from Dr Felecia Shelling) Patient will keep appointment for endoscopy with Dr Jena Gauss on 04/30/23 Patient will eat iron rich foods with vitamin C to help with absorption Patient will reach out to RN Care Management Coordinator [phone] with any resource or care coordination needs     Over the next 6 months patient will decrease A1C to < 7 (Health Equity Plan)   On track    Care Coordination Goals: Patient will follow-up with endocrinologist every 3 months or as recommended Endocrinology f/u scheduled for 04/08/23 Patient will continue to monitor and  record blood sugar daily and as needed with glucometer, and will call endocrinologist with any readings less than 70 or for 3 readings in a row over 200 Patient will take blood sugar log and meter to provider visits for review Patient will follow a carb modified diet with 3 meals per day that include 30GM of carbohydrates, lean proteins, and healthy fats and less than 2 snacks per day with less than 15GM of carbohydrates  Patient will increase physical activity as tolerated (limited by joint pain) with an ultimate goal of at least 150 minutes of moderate activity per week Patient will continue to monitor and record blood pressure twice per day, since HTN is a comorbidity of DM, and reach out to PCP with any readings outside of recommended range Patient will reach out to [facility] Coordinator [phone] with any care coordination or resource needs        SDOH assessments and interventions completed:  Yes  SDOH Interventions Today    Flowsheet Row Most Recent Value  SDOH Interventions   Housing Interventions Intervention Not Indicated  Transportation Interventions Payor Benefit, Patient Resources (Friends/Family)        Care Coordination Interventions:  Yes, provided  Interventions Today    Flowsheet Row Most Recent Value  Chronic Disease   Chronic disease during today's visit Diabetes, Other  [Anemia]  General Interventions   General Interventions Discussed/Reviewed Durable Medical Equipment (DME), Labs, Health Screening, General Interventions Discussed, General Interventions Reviewed, Annual Eye Exam, Annual Foot Exam, Doctor Visits  [needs  to schedule diabetic eye exam]  Labs Hgb A1c every 6 months  [has lab orders from Dr Felecia Shelling. Will have those done soon to check hemoglobin status.]  Doctor Visits Discussed/Reviewed Annual Wellness Visits, Doctor Visits Reviewed, Doctor Visits Discussed, PCP, Specialist  [AWV up to date]  Health Screening Colonoscopy, Mammogram  [up-to-date as well  as pap smear. Has that every 3 years.]  Durable Medical Equipment (DME) Glucomoter  [Blood sugar is stable. 143 this morning. None over 200. Lowest 135.]  PCP/Specialist Visits Compliance with follow-up visit  [facility] 04/03/23, Ronny Bacon, NP (endocrinologist) 04/08/23, Hematology on 04/09/23, endoscopy at [facility] on 04/30/23]  Exercise Interventions   Exercise Discussed/Reviewed Exercise Discussed, Exercise Reviewed, Physical Activity  Physical Activity Discussed/Reviewed Physical Activity Discussed, Physical Activity Reviewed  Education Interventions   Education Provided Provided Education  Provided Verbal Education On Nutrition, Foot Care, Eye Care, Labs, Blood Sugar Monitoring, When to see the doctor, Medication, Exercise  Labs Reviewed Hgb A1c  Mental Health Interventions   Mental Health Discussed/Reviewed Refer to Social Work for resources  Refer to Social Work for resources regarding Other  [referral to community care guides/social work to see if there is any assistance for Visual merchandiser costs. Patient's heating system is broken and she is using space heaters on the coldest days/nights.]  Nutrition Interventions   Nutrition Discussed/Reviewed Nutrition Discussed, Nutrition Reviewed, Carbohydrate meal planning, Fluid intake, Portion sizes, Decreasing sugar intake, Increasing proteins, Adding fruits and vegetables  Pharmacy Interventions   Pharmacy Dicussed/Reviewed Pharmacy Topics Discussed, Pharmacy Topics Reviewed, Medications and their functions  [taking medications as prescribed]  Safety Interventions   Safety Discussed/Reviewed Safety Discussed, Safety Reviewed, Fall Risk       Follow up plan: Follow up call scheduled for 04/11/23    Encounter Outcome:  Patient Visit Completed   Demetrios Loll, RN, BSN Care Management Coordinator [facility]  [facility] Direct Dial: [phone] Main #: [phone]

## 2023-03-12 DIAGNOSIS — I10 Essential (primary) hypertension: Secondary | ICD-10-CM | POA: Diagnosis not present

## 2023-03-12 DIAGNOSIS — J449 Chronic obstructive pulmonary disease, unspecified: Secondary | ICD-10-CM | POA: Diagnosis not present

## 2023-03-18 ENCOUNTER — Ambulatory Visit: Payer: Self-pay

## 2023-03-18 NOTE — Patient Instructions (Signed)
Visit Information  Thank you for taking time to visit with me today. Please don't hesitate to contact me if I can be of assistance to you.   Following are the goals we discussed today:   Goals Addressed             This Visit's Progress    Heating Assistance       -Patient will contact [facility] [employer] to apply for the Home Rehab program to assist with repairing the heating unit. Patient was quoted $1400 for the repairs.   -Patient will continue to use the space heater through the winter as it provides enough heat. -Patient will contact DSS to update LIE[employer] on the change of heating source.  The family has been approved for assistance but was scheduled to be applied toward the fuel bill.        Our next appointment is by telephone on 04/03/23 at 2pm  Please call the care guide team at [phone] if you need to cancel or reschedule your appointment.   If you are experiencing a Mental Health or Behavioral Health Crisis or need someone to talk to, please call 911  Patient verbalizes understanding of instructions and care plan provided today and agrees to view in MyChart. Active MyChart status and patient understanding of how to access instructions and care plan via MyChart confirmed with patient.     Telephone follow up appointment with care management team member scheduled for: 04/03/23 at 2pm.   Lysle Morales, BSW Social Worker [phone]

## 2023-03-18 NOTE — Patient Outreach (Signed)
  Care Coordination   Initial Visit Note   03/18/2023 Name: Wendy Kramer MRN: [id number] DOB: 1960-09-29  Wendy Kramer is a 62 y.o. year old female who sees [name], Wayland Salinas, MD for primary care. I spoke with  [name] by phone today.  What matters to the patients health and wellness today?  Patient needs resources to assist with heating source repairs.    Goals Addressed             This Visit's Progress    Heating Assistance       -Patient will contact [employer] to apply for the Home Rehab program to assist with repairing the heating unit. Patient was quoted $1400 for the repairs.   -Patient will continue to use the space heater through the winter as it provides enough heat. -Patient will contact DSS to update LIEAP on the change of heating source.  The family has been approved for assistance but was scheduled to be applied toward the fuel bill.        SDOH assessments and interventions completed:  Yes  SDOH Interventions Today    Flowsheet Row Most Recent Value  SDOH Interventions   Food Insecurity Interventions Intervention Not Indicated, Other (Comment)  [daughter received Foodstamps]  Housing Interventions Intervention Not Indicated, Other (Comment)  [Issues w/heating source. Ref to [place] [employer]]  Transportation Interventions Other (Comment)  [Has a car]  Utilities Interventions Intervention Not Indicated        Care Coordination Interventions:  Yes, provided   Follow up plan: Follow up call scheduled for 04/03/23 at 2pm.    Encounter Outcome:  Patient Visit Completed

## 2023-04-03 ENCOUNTER — Inpatient Hospital Stay: Payer: Medicare Other

## 2023-04-03 NOTE — Patient Outreach (Signed)
  Care Coordination   Follow Up Visit Note   04/03/2023 Name: Wendy Kramer MRN: [id number] DOB: 1960-06-19  Wendy Kramer is a 63 y.o. year old female who sees [name], Benita Area, MD for primary care. I spoke with  Wendy Kramer by phone today.  What matters to the patients health and wellness today?  Patient needs home repairs.    Goals Addressed             This Visit's Progress    Heating Assistance       Interventions Today    Flowsheet Row Most Recent Value  General Interventions   General Interventions Discussed/Reviewed General Interventions Discussed, General Interventions Reviewed  [Pt contacted Home Rehab but has not received a reply. Pt agreed to call again next week. Pt daughter updated [employer] application.]              SDOH assessments and interventions completed:  No     Care Coordination Interventions:  Yes, provided   Follow up plan: Follow up call scheduled for 04/17/23 at 11:30am.    Encounter Outcome:  Patient Visit Completed

## 2023-04-03 NOTE — Patient Instructions (Signed)
 Visit Information  Thank you for taking time to visit with me today. Please don't hesitate to contact me if I can be of assistance to you.   Following are the goals we discussed today:  Patient will contact Home Rehab program again next week if no response.   Our next appointment is by telephone on 04/17/23 at 11:30am  Please call the care guide team at [phone] if you need to cancel or reschedule your appointment.   If you are experiencing a Mental Health or Behavioral Health Crisis or need someone to talk to, please call 911  Patient verbalizes understanding of instructions and care plan provided today and agrees to view in MyChart. Active MyChart status and patient understanding of how to access instructions and care plan via MyChart confirmed with patient.     Telephone follow up appointment with care management team member scheduled for:04/17/23 at 11:30am   Tillman Gardener, BSW Social Worker [phone]

## 2023-04-05 ENCOUNTER — Emergency Department (HOSPITAL_COMMUNITY): Payer: Medicare Other

## 2023-04-05 ENCOUNTER — Inpatient Hospital Stay (HOSPITAL_COMMUNITY)
Admission: EM | Admit: 2023-04-05 | Discharge: 2023-04-08 | DRG: 871 | Disposition: A | Discharge status: Home / Self Care | Payer: Medicare Other | Attending: Internal Medicine | Admitting: Internal Medicine

## 2023-04-05 ENCOUNTER — Encounter (HOSPITAL_COMMUNITY): Payer: Self-pay

## 2023-04-05 ENCOUNTER — Other Ambulatory Visit: Payer: Self-pay

## 2023-04-05 DIAGNOSIS — K5730 Diverticulosis of large intestine without perforation or abscess without bleeding: Secondary | ICD-10-CM | POA: Diagnosis not present

## 2023-04-05 DIAGNOSIS — R42 Dizziness and giddiness: Secondary | ICD-10-CM | POA: Diagnosis present

## 2023-04-05 DIAGNOSIS — K7581 Nonalcoholic steatohepatitis (NASH): Secondary | ICD-10-CM | POA: Diagnosis not present

## 2023-04-05 DIAGNOSIS — D62 Acute posthemorrhagic anemia: Secondary | ICD-10-CM | POA: Diagnosis present

## 2023-04-05 DIAGNOSIS — G894 Chronic pain syndrome: Secondary | ICD-10-CM | POA: Diagnosis present

## 2023-04-05 DIAGNOSIS — K3189 Other diseases of stomach and duodenum: Secondary | ICD-10-CM | POA: Diagnosis not present

## 2023-04-05 DIAGNOSIS — K31819 Angiodysplasia of stomach and duodenum without bleeding: Secondary | ICD-10-CM | POA: Diagnosis not present

## 2023-04-05 DIAGNOSIS — E162 Hypoglycemia, unspecified: Secondary | ICD-10-CM | POA: Diagnosis not present

## 2023-04-05 DIAGNOSIS — A419 Sepsis, unspecified organism: Secondary | ICD-10-CM | POA: Diagnosis not present

## 2023-04-05 DIAGNOSIS — N200 Calculus of kidney: Secondary | ICD-10-CM | POA: Diagnosis not present

## 2023-04-05 DIAGNOSIS — Z8249 Family history of ischemic heart disease and other diseases of the circulatory system: Secondary | ICD-10-CM

## 2023-04-05 DIAGNOSIS — K219 Gastro-esophageal reflux disease without esophagitis: Secondary | ICD-10-CM | POA: Diagnosis present

## 2023-04-05 DIAGNOSIS — D509 Iron deficiency anemia, unspecified: Secondary | ICD-10-CM | POA: Diagnosis not present

## 2023-04-05 DIAGNOSIS — E782 Mixed hyperlipidemia: Secondary | ICD-10-CM | POA: Diagnosis present

## 2023-04-05 DIAGNOSIS — I10 Essential (primary) hypertension: Secondary | ICD-10-CM | POA: Diagnosis not present

## 2023-04-05 DIAGNOSIS — Z8673 Personal history of transient ischemic attack (TIA), and cerebral infarction without residual deficits: Secondary | ICD-10-CM

## 2023-04-05 DIAGNOSIS — Z6841 Body Mass Index (BMI) 40.0 and over, adult: Secondary | ICD-10-CM

## 2023-04-05 DIAGNOSIS — K529 Noninfective gastroenteritis and colitis, unspecified: Secondary | ICD-10-CM | POA: Diagnosis not present

## 2023-04-05 DIAGNOSIS — K31811 Angiodysplasia of stomach and duodenum with bleeding: Secondary | ICD-10-CM | POA: Diagnosis present

## 2023-04-05 DIAGNOSIS — Z8719 Personal history of other diseases of the digestive system: Secondary | ICD-10-CM | POA: Diagnosis not present

## 2023-04-05 DIAGNOSIS — Z7984 Long term (current) use of oral hypoglycemic drugs: Secondary | ICD-10-CM

## 2023-04-05 DIAGNOSIS — K766 Portal hypertension: Secondary | ICD-10-CM | POA: Diagnosis not present

## 2023-04-05 DIAGNOSIS — Z881 Allergy status to other antibiotic agents status: Secondary | ICD-10-CM | POA: Diagnosis not present

## 2023-04-05 DIAGNOSIS — I959 Hypotension, unspecified: Secondary | ICD-10-CM | POA: Diagnosis not present

## 2023-04-05 DIAGNOSIS — Z886 Allergy status to analgesic agent status: Secondary | ICD-10-CM | POA: Diagnosis not present

## 2023-04-05 DIAGNOSIS — I700 Atherosclerosis of aorta: Secondary | ICD-10-CM | POA: Diagnosis not present

## 2023-04-05 DIAGNOSIS — F17210 Nicotine dependence, cigarettes, uncomplicated: Secondary | ICD-10-CM | POA: Diagnosis present

## 2023-04-05 DIAGNOSIS — E66813 Obesity, class 3: Secondary | ICD-10-CM | POA: Diagnosis present

## 2023-04-05 DIAGNOSIS — Z860100 Personal history of colon polyps, unspecified: Secondary | ICD-10-CM

## 2023-04-05 DIAGNOSIS — I8390 Asymptomatic varicose veins of unspecified lower extremity: Secondary | ICD-10-CM | POA: Diagnosis present

## 2023-04-05 DIAGNOSIS — E161 Other hypoglycemia: Secondary | ICD-10-CM | POA: Diagnosis not present

## 2023-04-05 DIAGNOSIS — Z888 Allergy status to other drugs, medicaments and biological substances status: Secondary | ICD-10-CM

## 2023-04-05 DIAGNOSIS — Z87442 Personal history of urinary calculi: Secondary | ICD-10-CM

## 2023-04-05 DIAGNOSIS — E119 Type 2 diabetes mellitus without complications: Secondary | ICD-10-CM | POA: Diagnosis not present

## 2023-04-05 DIAGNOSIS — R000 Tachycardia, unspecified: Secondary | ICD-10-CM | POA: Diagnosis not present

## 2023-04-05 DIAGNOSIS — R197 Diarrhea, unspecified: Secondary | ICD-10-CM | POA: Diagnosis not present

## 2023-04-05 DIAGNOSIS — D649 Anemia, unspecified: Secondary | ICD-10-CM | POA: Diagnosis not present

## 2023-04-05 DIAGNOSIS — Z1152 Encounter for screening for COVID-19: Secondary | ICD-10-CM | POA: Diagnosis not present

## 2023-04-05 DIAGNOSIS — D72829 Elevated white blood cell count, unspecified: Secondary | ICD-10-CM | POA: Diagnosis not present

## 2023-04-05 DIAGNOSIS — R509 Fever, unspecified: Secondary | ICD-10-CM | POA: Diagnosis not present

## 2023-04-05 DIAGNOSIS — K7460 Unspecified cirrhosis of liver: Secondary | ICD-10-CM | POA: Diagnosis present

## 2023-04-05 DIAGNOSIS — J9811 Atelectasis: Secondary | ICD-10-CM | POA: Diagnosis not present

## 2023-04-05 LAB — RESP PANEL BY RT-PCR (RSV, FLU A&B, COVID)  RVPGX2
Influenza A by PCR: NEGATIVE
Influenza B by PCR: NEGATIVE
Resp Syncytial Virus by PCR: NEGATIVE
SARS Coronavirus 2 by RT PCR: NEGATIVE

## 2023-04-05 LAB — URINALYSIS, W/ REFLEX TO CULTURE (INFECTION SUSPECTED)
Bilirubin Urine: NEGATIVE
Glucose, UA: NEGATIVE mg/dL
Hgb urine dipstick: NEGATIVE
Ketones, ur: NEGATIVE mg/dL
Leukocytes,Ua: NEGATIVE
Nitrite: NEGATIVE
Protein, ur: NEGATIVE mg/dL
Specific Gravity, Urine: 1.017 (ref 1.005–1.030)
pH: 6 (ref 5.0–8.0)

## 2023-04-05 LAB — COMPREHENSIVE METABOLIC PANEL
ALT: 19 U/L (ref 0–44)
AST: 33 U/L (ref 15–41)
Albumin: 3.5 g/dL (ref 3.5–5.0)
Alkaline Phosphatase: 81 U/L (ref 38–126)
Anion gap: 10 (ref 5–15)
BUN: 14 mg/dL (ref 8–23)
CO2: 23 mmol/L (ref 22–32)
Calcium: 9.1 mg/dL (ref 8.9–10.3)
Chloride: 102 mmol/L (ref 98–111)
Creatinine, Ser: 0.74 mg/dL (ref 0.44–1.00)
GFR, Estimated: >60 mL/min (ref >60)
Glucose, Bld: 172 mg/dL — ABNORMAL HIGH (ref 70–99)
Potassium: 3.9 mmol/L (ref 3.5–5.1)
Sodium: 135 mmol/L (ref 135–145)
Total Bilirubin: 0.9 mg/dL (ref 0.0–1.2)
Total Protein: 7.1 g/dL (ref 6.5–8.1)

## 2023-04-05 LAB — CBC WITH DIFFERENTIAL/PLATELET
Abs Immature Granulocytes: 0.09 10*3/uL — ABNORMAL HIGH (ref 0.00–0.07)
Basophils Absolute: 0 10*3/uL (ref 0.0–0.1)
Basophils Relative: 0 %
Eosinophils Absolute: 0 10*3/uL (ref 0.0–0.5)
Eosinophils Relative: 0 %
HCT: 26.3 % — ABNORMAL LOW (ref 36.0–46.0)
Hemoglobin: 7.1 g/dL — ABNORMAL LOW (ref 12.0–15.0)
Immature Granulocytes: 1 %
Lymphocytes Relative: 4 %
Lymphs Abs: 0.6 10*3/uL — ABNORMAL LOW (ref 0.7–4.0)
MCH: 17.8 pg — ABNORMAL LOW (ref 26.0–34.0)
MCHC: 27 g/dL — ABNORMAL LOW (ref 30.0–36.0)
MCV: 65.9 fL — ABNORMAL LOW (ref 80.0–100.0)
Monocytes Absolute: 0.8 10*3/uL (ref 0.1–1.0)
Monocytes Relative: 5 %
Neutro Abs: 14.8 10*3/uL — ABNORMAL HIGH (ref 1.7–7.7)
Neutrophils Relative %: 90 %
Platelets: 167 10*3/uL (ref 150–400)
RBC: 3.99 MIL/uL (ref 3.87–5.11)
RDW: 19.1 % — ABNORMAL HIGH (ref 11.5–15.5)
WBC: 16.4 10*3/uL — ABNORMAL HIGH (ref 4.0–10.5)
nRBC: 0 % (ref 0.0–0.2)

## 2023-04-05 LAB — LACTIC ACID, PLASMA: Lactic Acid, Venous: 1.8 mmol/L (ref 0.5–1.9)

## 2023-04-05 LAB — LIPASE, BLOOD: Lipase: 32 U/L (ref 11–51)

## 2023-04-05 MED ORDER — VANCOMYCIN HCL IN DEXTROSE 1-5 GM/200ML-% IV SOLN
1000.0000 mg | Freq: Once | INTRAVENOUS | Status: DC
  Administered 2023-04-06: 1000 mg via INTRAVENOUS
  Filled 2023-04-05: qty 200

## 2023-04-05 MED ORDER — SODIUM CHLORIDE 0.9 % IV BOLUS
1000.0000 mL | Freq: Once | INTRAVENOUS | Status: AC
  Administered 2023-04-05: 1000 mL via INTRAVENOUS

## 2023-04-05 MED ORDER — METRONIDAZOLE 500 MG/100ML IV SOLN
500.0000 mg | Freq: Once | INTRAVENOUS | Status: AC
  Administered 2023-04-06: 500 mg via INTRAVENOUS
  Filled 2023-04-05: qty 100

## 2023-04-05 MED ORDER — ACETAMINOPHEN 500 MG PO TABS
1000.0000 mg | ORAL_TABLET | Freq: Once | ORAL | Status: AC
  Administered 2023-04-05: 1000 mg via ORAL
  Filled 2023-04-05: qty 2

## 2023-04-05 MED ORDER — IOHEXOL 300 MG/ML  SOLN
100.0000 mL | Freq: Once | INTRAMUSCULAR | Status: AC | PRN
  Administered 2023-04-05: 100 mL via INTRAVENOUS

## 2023-04-05 MED ORDER — SODIUM CHLORIDE 0.9 % IV SOLN
2.0000 g | Freq: Once | INTRAVENOUS | Status: AC
  Administered 2023-04-06: 2 g via INTRAVENOUS
  Filled 2023-04-05: qty 12.5

## 2023-04-05 MED ORDER — ONDANSETRON HCL 4 MG/2ML IJ SOLN
4.0000 mg | Freq: Once | INTRAMUSCULAR | Status: AC
  Administered 2023-04-05: 4 mg via INTRAVENOUS
  Filled 2023-04-05: qty 2

## 2023-04-05 MED ORDER — LACTATED RINGERS IV SOLN: INTRAVENOUS | Status: DC

## 2023-04-05 MED ORDER — ACETAMINOPHEN 325 MG PO TABS
650.0000 mg | ORAL_TABLET | Freq: Once | ORAL | Status: DC | PRN
  Filled 2023-04-05: qty 2

## 2023-04-05 NOTE — ED Triage Notes (Signed)
 Pt reports:  Diarrhea Started today Nausea Started today Weakness Started today

## 2023-04-05 NOTE — ED Provider Notes (Addendum)
 [place] EMERGENCY DEPARTMENT AT [facility] Provider Note   CSN: [id number] Arrival date & time: 04/05/23  1959     History  No chief complaint on file.   Wendy Kramer is a 63 y.o. female.  She has PMH of GI bleeding and anemia, diabetes, GERD, obesity.  Presents the ER today for evaluation of nausea, diarrhea and fever.  States she felt like she had the symptoms the past 2 days but woke up today having diarrhea, had 3 episodes with large amounts of loose stool.  Has had nausea but no vomiting.  No abdominal pain, no urinary symptoms.  Feels overall generally weak.  Denies any chest pain or shortness of breath.  She denies sick contacts.  HPI     Home Medications Prior to Admission medications   Medication Sig Start Date End Date Taking? Authorizing Provider  Accu-Chek Softclix Lancets lancets Use as instructed to monitor glucose 1 times daily. 11/06/22   Therisa Benton PARAS, NP  acetaminophen  (TYLENOL ) 500 MG tablet Take 500 mg by mouth every 6 (six) hours as needed for moderate pain.    [provider]  albuterol  (PROVENTIL  HFA;VENTOLIN  HFA) 108 (90 Base) MCG/ACT inhaler Inhale 1 puff into the lungs every 6 (six) hours as needed for wheezing or shortness of breath.    [provider]  Blood Glucose Monitoring Suppl (ACCU-CHEK GUIDE ME) w/Device KIT 1 Piece by Does not apply route as directed. 11/02/19   Nida, Gebreselassie W, MD  BREO ELLIPTA  200-25 MCG/ACT AEPB Inhale 1 puff into the lungs daily. 12/04/22   [provider]  fluticasone  (FLONASE ) 50 MCG/ACT nasal spray Place 1 spray into both nostrils daily as needed for allergies or rhinitis.     [provider]  glucose blood (ACCU-CHEK GUIDE) test strip USE TO CHECK BLOOD SUGARS ONCE DAILY 12/09/22   Therisa Benton PARAS, NP  lisinopril (ZESTRIL) 20 MG tablet Take 10 mg by mouth daily.    [provider]  metFORMIN  (GLUCOPHAGE ) 500 MG tablet Take 1 tablet (500 mg total) by mouth 2  (two) times daily with a meal. 01/06/23   Reardon, Benton PARAS, NP  Multiple Vitamins-Minerals (ONE DAILY MULTIVIT/IRON -FREE) TABS Take 1 tablet by mouth every evening.    [provider]  RABEprazole  (ACIPHEX ) 20 MG tablet Take 1 tablet (20 mg total) by mouth 2 (two) times daily before a meal. 12/23/22   Mahon, Charmaine CROME, NP  sodium chloride  (OCEAN) 0.65 % SOLN nasal spray Place 1 spray into both nostrils as needed for congestion.    [provider]  traMADol  (ULTRAM ) 50 MG tablet Take 50-100 mg by mouth every 8 (eight) hours as needed for moderate pain. 09/20/16   [provider]      Allergies    Asa [aspirin], Augmentin [amoxicillin-pot clavulanate], Levaquin [levofloxacin in d5w], and Lovenox [enoxaparin sodium]    Review of Systems   Review of Systems  Physical Exam Updated Vital Signs BP (!) 123/53 (BP Location: Left Arm)   Pulse 97   Temp (!) 101.7 F (38.7 C) (Oral)   Resp 16   Ht 5' 3 (1.6 m)   Wt 103 kg   LMP 09/06/2016   SpO2 96%   BMI 40.21 kg/m  Physical Exam Vitals and nursing note reviewed.  Constitutional:      General: She is not in acute distress.    Appearance: She is well-developed.  HENT:     Head: Normocephalic and atraumatic.  Mouth/Throat:     Mouth: Mucous membranes are moist.  Eyes:     Extraocular Movements: Extraocular movements intact.     Conjunctiva/sclera: Conjunctivae normal.     Pupils: Pupils are equal, round, and reactive to light.  Cardiovascular:     Rate and Rhythm: Normal rate and regular rhythm.     Heart sounds: No murmur heard. Pulmonary:     Effort: Pulmonary effort is normal. No respiratory distress.     Breath sounds: Normal breath sounds.  Abdominal:     Palpations: Abdomen is soft.     Tenderness: There is no abdominal tenderness. There is no right CVA tenderness, left CVA tenderness, guarding or rebound.  Musculoskeletal:        General: No swelling.     Cervical back: Neck supple.  Skin:     General: Skin is warm and dry.     Capillary Refill: Capillary refill takes less than 2 seconds.  Neurological:     General: No focal deficit present.     Mental Status: She is alert and oriented to person, place, and time.  Psychiatric:        Mood and Affect: Mood normal.     ED Results / Procedures / Treatments   Labs (all labs ordered are listed, but only abnormal results are displayed) Labs Reviewed  COMPREHENSIVE METABOLIC PANEL - Abnormal; Notable for the following components:      Result Value   Glucose, Bld 172 (*)    All other components within normal limits  CBC WITH DIFFERENTIAL/PLATELET - Abnormal; Notable for the following components:   WBC 16.4 (*)    Hemoglobin 7.1 (*)    HCT 26.3 (*)    MCV 65.9 (*)    MCH 17.8 (*)    MCHC 27.0 (*)    RDW 19.1 (*)    Neutro Abs 14.8 (*)    Lymphs Abs 0.6 (*)    Abs Immature Granulocytes 0.09 (*)    All other components within normal limits  URINALYSIS, W/ REFLEX TO CULTURE (INFECTION SUSPECTED) - Abnormal; Notable for the following components:   APPearance HAZY (*)    Bacteria, UA RARE (*)    All other components within normal limits  RESP PANEL BY RT-PCR (RSV, FLU A&B, COVID)  RVPGX2  CULTURE, BLOOD (ROUTINE X 2)  CULTURE, BLOOD (ROUTINE X 2)  LACTIC ACID, PLASMA  LIPASE, BLOOD  LACTIC ACID, PLASMA    EKG None  Radiology CT ABDOMEN PELVIS W CONTRAST Result Date: 04/05/2023 CLINICAL DATA:  Left lower quadrant pain with diarrhea, initial encounter EXAM: CT ABDOMEN AND PELVIS WITH CONTRAST TECHNIQUE: Multidetector CT imaging of the abdomen and pelvis was performed using the standard protocol following bolus administration of intravenous contrast. RADIATION DOSE REDUCTION: This exam was performed according to the departmental dose-optimization program which includes automated exposure control, adjustment of the mA and/or kV according to patient size and/or use of iterative reconstruction technique. CONTRAST:   OMNIPAQUE  IOHEXOL  300 MG/ML  SOLN COMPARISON:  None Available. FINDINGS: Lower chest: No acute abnormality. Hepatobiliary: Liver shows mild nodularity consistent with underlying cirrhosis. No discrete mass is seen. The gallbladder has been surgically removed. Pancreas: Unremarkable. No pancreatic ductal dilatation or surrounding inflammatory changes. Spleen: Normal in size without focal abnormality. Adrenals/Urinary Tract: Adrenal glands are within normal limits. Kidneys are well visualized bilaterally. Tiny nonobstructing left renal stone is noted in the upper pole. Scattered simple cysts are seen. No follow-up is recommended. No obstructive changes are seen. The  bladder is partially distended. Stomach/Bowel: Scattered diverticular change of the colon is noted. No evidence of diverticulitis is seen. The appendix is not well visualized. Some very mild inflammatory changes noted surrounding the cecum. Mild wall thickening is noted in the cecum consistent with focal ascending colitis. Small bowel is within normal limits. Stomach is unremarkable. Vascular/Lymphatic: Aortic atherosclerosis. No enlarged abdominal or pelvic lymph nodes. Reproductive: Uterus and bilateral adnexa are unremarkable. Other: Changes of prior hernia repair are noted. A recurrent fat containing hernia is noted anteriorly in part due to diastasis of the rectus muscles. Musculoskeletal: Chronic L1 compression fracture is noted. IMPRESSION: Changes of hepatic cirrhosis. Tiny nonobstructing left renal stone. Diverticulosis without diverticulitis. Mild ascending colitis. Electronically Signed   By: Oneil Devonshire M.D.   On: 04/05/2023 23:28   DG Chest 2 View Result Date: 04/05/2023 CLINICAL DATA:  Fever and diarrhea EXAM: CHEST - 2 VIEW COMPARISON:  02/06/2023 FINDINGS: Stable cardiomediastinal silhouette. Aortic atherosclerotic calcification. Bibasilar atelectasis. Otherwise no focal consolidation, pleural effusion, or pneumothorax. No displaced rib  fractures. Similar superior endplate compression fracture of L1. IMPRESSION: No acute cardiopulmonary disease. Electronically Signed   By: Norman Gatlin M.D.   On: 04/05/2023 20:43    Procedures .Critical Care  Performed by: Suellen Sherran LABOR, PA-C Authorized by: Suellen Sherran LABOR, PA-C   Critical care provider statement:    Critical care time (minutes):  30   Critical care was necessary to treat or prevent imminent or life-threatening deterioration of the following conditions:  Sepsis   Critical care was time spent personally by me on the following activities:  Development of treatment plan with patient or surrogate, discussions with consultants, evaluation of patient's response to treatment, examination of patient, ordering and review of laboratory studies, ordering and review of radiographic studies, ordering and performing treatments and interventions, pulse oximetry, re-evaluation of patient's condition, review of old charts and obtaining history from patient or surrogate     Medications Ordered in ED Medications  lactated ringers  infusion (has no administration in time range)  ceFEPIme  (MAXIPIME ) 2 g in sodium chloride  0.9 % 100 mL IVPB (has no administration in time range)  metroNIDAZOLE  (FLAGYL ) IVPB 500 mg (has no administration in time range)  vancomycin  (VANCOCIN ) IVPB 1000 mg/200 mL premix (has no administration in time range)  ondansetron  (ZOFRAN ) injection 4 mg (4 mg Intravenous Given 04/05/23 2128)  sodium chloride  0.9 % bolus 1,000 mL (1,000 mLs Intravenous New Bag/Given 04/05/23 2136)  acetaminophen  (TYLENOL ) tablet 1,000 mg (1,000 mg Oral Given 04/05/23 2128)  iohexol  (OMNIPAQUE ) 300 MG/ML solution 100 mL (100 mLs Intravenous Contrast Given 04/05/23 2306)    ED Course/ Medical Decision Making/ A&P                                 Medical Decision Making This patient presents to the ED for concern of fever, diarrhea, nausea, this involves an extensive number of treatment  options, and is a complaint that carries with it a high risk of complications and morbidity.  The differential diagnosis includes viral illness, sepsis, dehydration, colitis, diverticulitis, other   Co morbidities that complicate the patient evaluation :   Anemia, GI bleeding, diabetes   Additional history obtained:  Additional history obtained from EMR External records from outside source obtained and reviewed including notes, labs   Lab Tests:  I Ordered, and personally interpreted labs.  The pertinent results include: negative viral swab, negative UA, leukocytosis  and slightly worsened anemia on CBC   Imaging Studies ordered:  I ordered imaging studies including chest x-ray which shows no pulmonary edema or infiltrates I independently visualized and interpreted imaging within scope of identifying emergent findings  I agree with the radiologist interpretation     Problem List / ED Course / Critical interventions / Medication management  Patient presents meeting SIRS criteria, viral testing negative, she did having diarrhea this morning and has some mild left lower quadrant tenderness, could be colitis versus diverticulitis, given Tylenol , IV fluids, pending CT abdomen pelvis, pt given empiric antibiotics  I have reviewed the patients home medicines and have made adjustments as needed   Signed out to Dr. Melvenia.    Amount and/or Complexity of Data Reviewed Labs: ordered. Radiology: ordered.  Risk OTC drugs. Prescription drug management. Decision regarding hospitalization.           Final Clinical Impression(s) / ED Diagnoses Final diagnoses:  Sepsis, due to unspecified organism, unspecified whether acute organ dysfunction present [facility])    Rx / DC Orders ED Discharge Orders     None         Suellen Sherran LABOR, PA-C 04/05/23 2332    Suellen Sherran LABOR, PA-C 04/05/23 2338    Francesca Elsie CROME, MD 04/06/23 [phone]

## 2023-04-06 DIAGNOSIS — Z8249 Family history of ischemic heart disease and other diseases of the circulatory system: Secondary | ICD-10-CM | POA: Diagnosis not present

## 2023-04-06 DIAGNOSIS — Z888 Allergy status to other drugs, medicaments and biological substances status: Secondary | ICD-10-CM | POA: Diagnosis not present

## 2023-04-06 DIAGNOSIS — Z881 Allergy status to other antibiotic agents status: Secondary | ICD-10-CM | POA: Diagnosis not present

## 2023-04-06 DIAGNOSIS — K31811 Angiodysplasia of stomach and duodenum with bleeding: Secondary | ICD-10-CM | POA: Diagnosis present

## 2023-04-06 DIAGNOSIS — Z87442 Personal history of urinary calculi: Secondary | ICD-10-CM | POA: Diagnosis not present

## 2023-04-06 DIAGNOSIS — F17210 Nicotine dependence, cigarettes, uncomplicated: Secondary | ICD-10-CM | POA: Diagnosis present

## 2023-04-06 DIAGNOSIS — E782 Mixed hyperlipidemia: Secondary | ICD-10-CM | POA: Diagnosis present

## 2023-04-06 DIAGNOSIS — Z7984 Long term (current) use of oral hypoglycemic drugs: Secondary | ICD-10-CM | POA: Diagnosis not present

## 2023-04-06 DIAGNOSIS — D62 Acute posthemorrhagic anemia: Secondary | ICD-10-CM | POA: Diagnosis present

## 2023-04-06 DIAGNOSIS — K219 Gastro-esophageal reflux disease without esophagitis: Secondary | ICD-10-CM | POA: Diagnosis present

## 2023-04-06 DIAGNOSIS — Z886 Allergy status to analgesic agent status: Secondary | ICD-10-CM | POA: Diagnosis not present

## 2023-04-06 DIAGNOSIS — K31819 Angiodysplasia of stomach and duodenum without bleeding: Secondary | ICD-10-CM | POA: Diagnosis not present

## 2023-04-06 DIAGNOSIS — Z8719 Personal history of other diseases of the digestive system: Secondary | ICD-10-CM | POA: Diagnosis not present

## 2023-04-06 DIAGNOSIS — A419 Sepsis, unspecified organism: Secondary | ICD-10-CM | POA: Diagnosis present

## 2023-04-06 DIAGNOSIS — I10 Essential (primary) hypertension: Secondary | ICD-10-CM | POA: Diagnosis present

## 2023-04-06 DIAGNOSIS — E66813 Obesity, class 3: Secondary | ICD-10-CM | POA: Diagnosis present

## 2023-04-06 DIAGNOSIS — R000 Tachycardia, unspecified: Secondary | ICD-10-CM | POA: Diagnosis not present

## 2023-04-06 DIAGNOSIS — D72829 Elevated white blood cell count, unspecified: Secondary | ICD-10-CM | POA: Diagnosis not present

## 2023-04-06 DIAGNOSIS — K766 Portal hypertension: Secondary | ICD-10-CM | POA: Diagnosis present

## 2023-04-06 DIAGNOSIS — K7460 Unspecified cirrhosis of liver: Secondary | ICD-10-CM | POA: Diagnosis present

## 2023-04-06 DIAGNOSIS — E119 Type 2 diabetes mellitus without complications: Secondary | ICD-10-CM | POA: Diagnosis present

## 2023-04-06 DIAGNOSIS — Z6841 Body Mass Index (BMI) 40.0 and over, adult: Secondary | ICD-10-CM | POA: Diagnosis not present

## 2023-04-06 DIAGNOSIS — Z1152 Encounter for screening for COVID-19: Secondary | ICD-10-CM | POA: Diagnosis not present

## 2023-04-06 DIAGNOSIS — D649 Anemia, unspecified: Secondary | ICD-10-CM | POA: Diagnosis not present

## 2023-04-06 DIAGNOSIS — K7581 Nonalcoholic steatohepatitis (NASH): Secondary | ICD-10-CM | POA: Diagnosis present

## 2023-04-06 DIAGNOSIS — D509 Iron deficiency anemia, unspecified: Secondary | ICD-10-CM | POA: Diagnosis present

## 2023-04-06 DIAGNOSIS — K529 Noninfective gastroenteritis and colitis, unspecified: Secondary | ICD-10-CM | POA: Diagnosis present

## 2023-04-06 DIAGNOSIS — K3189 Other diseases of stomach and duodenum: Secondary | ICD-10-CM | POA: Diagnosis not present

## 2023-04-06 DIAGNOSIS — G894 Chronic pain syndrome: Secondary | ICD-10-CM | POA: Diagnosis present

## 2023-04-06 DIAGNOSIS — R42 Dizziness and giddiness: Secondary | ICD-10-CM | POA: Diagnosis present

## 2023-04-06 LAB — CBC
HCT: 23 % — ABNORMAL LOW (ref 36.0–46.0)
HCT: 23.6 % — ABNORMAL LOW (ref 36.0–46.0)
Hemoglobin: 6.3 g/dL — CL (ref 12.0–15.0)
Hemoglobin: 6.2 g/dL — CL (ref 12.0–15.0)
MCH: 18.1 pg — ABNORMAL LOW (ref 26.0–34.0)
MCH: 17.5 pg — ABNORMAL LOW (ref 26.0–34.0)
MCHC: 27.4 g/dL — ABNORMAL LOW (ref 30.0–36.0)
MCHC: 26.3 g/dL — ABNORMAL LOW (ref 30.0–36.0)
MCV: 66.1 fL — ABNORMAL LOW (ref 80.0–100.0)
MCV: 66.7 fL — ABNORMAL LOW (ref 80.0–100.0)
Platelets: 153 10*3/uL (ref 150–400)
Platelets: 149 10*3/uL — ABNORMAL LOW (ref 150–400)
RBC: 3.48 MIL/uL — ABNORMAL LOW (ref 3.87–5.11)
RBC: 3.54 MIL/uL — ABNORMAL LOW (ref 3.87–5.11)
RDW: 19.2 % — ABNORMAL HIGH (ref 11.5–15.5)
RDW: 19.4 % — ABNORMAL HIGH (ref 11.5–15.5)
WBC: 17.2 10*3/uL — ABNORMAL HIGH (ref 4.0–10.5)
WBC: 15 10*3/uL — ABNORMAL HIGH (ref 4.0–10.5)
nRBC: 0 % (ref 0.0–0.2)
nRBC: 0 % (ref 0.0–0.2)

## 2023-04-06 LAB — GLUCOSE, CAPILLARY
Glucose-Capillary: 183 mg/dL — ABNORMAL HIGH (ref 70–99)
Glucose-Capillary: 203 mg/dL — ABNORMAL HIGH (ref 70–99)

## 2023-04-06 LAB — BASIC METABOLIC PANEL
Anion gap: 6 (ref 5–15)
BUN: 15 mg/dL (ref 8–23)
CO2: 23 mmol/L (ref 22–32)
Calcium: 8.1 mg/dL — ABNORMAL LOW (ref 8.9–10.3)
Chloride: 104 mmol/L (ref 98–111)
Creatinine, Ser: 0.88 mg/dL (ref 0.44–1.00)
GFR, Estimated: >60 mL/min (ref >60)
Glucose, Bld: 171 mg/dL — ABNORMAL HIGH (ref 70–99)
Potassium: 4 mmol/L (ref 3.5–5.1)
Sodium: 133 mmol/L — ABNORMAL LOW (ref 135–145)

## 2023-04-06 LAB — CBG MONITORING, ED
Glucose-Capillary: 167 mg/dL — ABNORMAL HIGH (ref 70–99)
Glucose-Capillary: 207 mg/dL — ABNORMAL HIGH (ref 70–99)

## 2023-04-06 LAB — MAGNESIUM: Magnesium: 1.4 mg/dL — ABNORMAL LOW (ref 1.7–2.4)

## 2023-04-06 LAB — PHOSPHORUS: Phosphorus: 3.6 mg/dL (ref 2.5–4.6)

## 2023-04-06 LAB — C DIFFICILE QUICK SCREEN W PCR REFLEX
C Diff antigen: NEGATIVE
C Diff interpretation: NOT DETECTED
C Diff toxin: NEGATIVE

## 2023-04-06 LAB — HEMOGLOBIN AND HEMATOCRIT, BLOOD
HCT: 25.4 % — ABNORMAL LOW (ref 36.0–46.0)
Hemoglobin: 6.9 g/dL — CL (ref 12.0–15.0)

## 2023-04-06 LAB — LACTIC ACID, PLASMA: Lactic Acid, Venous: 1.3 mmol/L (ref 0.5–1.9)

## 2023-04-06 LAB — PREPARE RBC (CROSSMATCH)

## 2023-04-06 MED ORDER — TRAMADOL HCL 50 MG PO TABS: 25.0000 mg | ORAL_TABLET | Freq: Three times a day (TID) | ORAL | Status: DC | PRN

## 2023-04-06 MED ORDER — MELATONIN 3 MG PO TABS
6.0000 mg | ORAL_TABLET | Freq: Every evening | ORAL | Status: DC | PRN
  Administered 2023-04-06: 6 mg via ORAL
  Filled 2023-04-06: qty 2

## 2023-04-06 MED ORDER — LACTATED RINGERS IV SOLN: INTRAVENOUS | Status: AC

## 2023-04-06 MED ORDER — SODIUM CHLORIDE 0.9% IV SOLUTION: Freq: Once | INTRAVENOUS | Status: DC

## 2023-04-06 MED ORDER — INSULIN ASPART 100 UNIT/ML IJ SOLN
0.0000 [IU] | Freq: Three times a day (TID) | INTRAMUSCULAR | Status: DC
  Administered 2023-04-06 – 2023-04-07 (×3): 1 [IU] via SUBCUTANEOUS
  Administered 2023-04-07: 3 [IU] via SUBCUTANEOUS
  Filled 2023-04-06: qty 1

## 2023-04-06 MED ORDER — METRONIDAZOLE 500 MG PO TABS
500.0000 mg | ORAL_TABLET | Freq: Two times a day (BID) | ORAL | Status: DC
  Administered 2023-04-06 – 2023-04-08 (×5): 500 mg via ORAL
  Filled 2023-04-06 (×5): qty 1

## 2023-04-06 MED ORDER — PANTOPRAZOLE SODIUM 40 MG IV SOLR
40.0000 mg | Freq: Two times a day (BID) | INTRAVENOUS | Status: DC
  Administered 2023-04-06 – 2023-04-08 (×5): 40 mg via INTRAVENOUS
  Filled 2023-04-06 (×5): qty 10

## 2023-04-06 MED ORDER — DIPHENHYDRAMINE HCL 50 MG/ML IJ SOLN
25.0000 mg | Freq: Once | INTRAMUSCULAR | Status: AC
  Administered 2023-04-06: 25 mg via INTRAVENOUS
  Filled 2023-04-06: qty 1

## 2023-04-06 MED ORDER — ACETAMINOPHEN 325 MG PO TABS
650.0000 mg | ORAL_TABLET | Freq: Once | ORAL | Status: AC
  Administered 2023-04-06: 650 mg via ORAL
  Filled 2023-04-06: qty 2

## 2023-04-06 MED ORDER — SODIUM CHLORIDE 0.9 % IV SOLN
2.0000 g | Freq: Three times a day (TID) | INTRAVENOUS | Status: DC
  Administered 2023-04-06 – 2023-04-08 (×7): 2 g via INTRAVENOUS
  Filled 2023-04-06 (×9): qty 12.5

## 2023-04-06 MED ORDER — ALBUTEROL SULFATE (2.5 MG/3ML) 0.083% IN NEBU: 2.5000 mg | INHALATION_SOLUTION | RESPIRATORY_TRACT | Status: DC | PRN

## 2023-04-06 MED ORDER — ACETAMINOPHEN 500 MG PO TABS: 500.0000 mg | ORAL_TABLET | Freq: Four times a day (QID) | ORAL | Status: DC | PRN

## 2023-04-06 MED ORDER — FLUTICASONE FUROATE-VILANTEROL 200-25 MCG/ACT IN AEPB
1.0000 | INHALATION_SPRAY | Freq: Every day | RESPIRATORY_TRACT | Status: DC
Start: 2023-04-06 — End: 2023-04-08
  Administered 2023-04-06 – 2023-04-08 (×3): 1 via RESPIRATORY_TRACT
  Filled 2023-04-06: qty 28

## 2023-04-06 MED ORDER — ONDANSETRON HCL 4 MG/2ML IJ SOLN: 4.0000 mg | Freq: Four times a day (QID) | INTRAMUSCULAR | Status: DC | PRN

## 2023-04-06 MED ORDER — ACETAMINOPHEN 500 MG PO TABS
500.0000 mg | ORAL_TABLET | Freq: Four times a day (QID) | ORAL | Status: DC | PRN
  Administered 2023-04-06: 500 mg via ORAL
  Filled 2023-04-06: qty 1

## 2023-04-06 MED ORDER — MAGNESIUM SULFATE 2 GM/50ML IV SOLN
2.0000 g | Freq: Once | INTRAVENOUS | Status: AC
  Administered 2023-04-06: 2 g via INTRAVENOUS
  Filled 2023-04-06: qty 50

## 2023-04-06 MED ORDER — SODIUM CHLORIDE 0.9% FLUSH
3.0000 mL | Freq: Two times a day (BID) | INTRAVENOUS | Status: DC
  Administered 2023-04-06 – 2023-04-08 (×6): 3 mL via INTRAVENOUS

## 2023-04-06 MED ORDER — PANTOPRAZOLE SODIUM 40 MG PO TBEC: 40.0000 mg | DELAYED_RELEASE_TABLET | Freq: Every day | ORAL | Status: DC

## 2023-04-06 MED ORDER — TRAMADOL HCL 50 MG PO TABS: 50.0000 mg | ORAL_TABLET | Freq: Three times a day (TID) | ORAL | Status: DC | PRN

## 2023-04-06 NOTE — H&P (Signed)
 History and Physical    Wendy Kramer FMW:[id number] DOB: 1960/12/09 DOA: 04/05/2023  PCP: Carlette Benita Area, MD   Patient coming from: Home   Chief Complaint: Lightheadedness    HPI:  Wendy Kramer is a 63 y.o. female with hx of Nash cirrhosis, GAVE, history of upper GI bleeding, iron  deficiency anemia, allergic/anaphylactic transfusion reaction, hypertension, diabetes, who presents due to 2-day history of lightheadedness, presyncope.  Over the same time.  Developed diarrhea watery stool initially describes as coffee-ground material but clarifies not dark in color but rather particulate brown stool.  Denies similar to GI bleeding in the past, no melena, or bright red blood.  Has had about 3-4 episodes.  Also nausea, no vomiting.  No sick contacts.  Having chills but no measured fever.  No other recent illness   Review of Systems:  ROS complete and negative except as marked above   Allergies  Allergen Reactions   Asa [Aspirin] Anaphylaxis    Tolerates other NSAIDS   Augmentin [Amoxicillin-Pot Clavulanate] Other (See Comments)    Retching and pass out   Levaquin [Levofloxacin In D5w] Itching and Other (See Comments)    body turned red   Lovenox [Enoxaparin Sodium] Other (See Comments)    physician instructed not to use ever again. white veins popped out    Prior to Admission medications   Medication Sig Start Date End Date Taking? Authorizing Provider  Accu-Chek Softclix Lancets lancets Use as instructed to monitor glucose 1 times daily. 11/06/22   Therisa Benton PARAS, NP  acetaminophen  (TYLENOL ) 500 MG tablet Take 500 mg by mouth every 6 (six) hours as needed for moderate pain.    [provider]  albuterol  (PROVENTIL  HFA;VENTOLIN  HFA) 108 (90 Base) MCG/ACT inhaler Inhale 1 puff into the lungs every 6 (six) hours as needed for wheezing or shortness of breath.    [provider]  Blood Glucose Monitoring Suppl (ACCU-CHEK GUIDE ME) w/Device KIT 1 Piece by  Does not apply route as directed. 11/02/19   Nida, Gebreselassie W, MD  BREO ELLIPTA  200-25 MCG/ACT AEPB Inhale 1 puff into the lungs daily. 12/04/22   [provider]  fluticasone  (FLONASE ) 50 MCG/ACT nasal spray Place 1 spray into both nostrils daily as needed for allergies or rhinitis.     [provider]  glucose blood (ACCU-CHEK GUIDE) test strip USE TO CHECK BLOOD SUGARS ONCE DAILY 12/09/22   Therisa Benton PARAS, NP  lisinopril (ZESTRIL) 20 MG tablet Take 10 mg by mouth daily.    [provider]  metFORMIN  (GLUCOPHAGE ) 500 MG tablet Take 1 tablet (500 mg total) by mouth 2 (two) times daily with a meal. 01/06/23   Reardon, Benton PARAS, NP  Multiple Vitamins-Minerals (ONE DAILY MULTIVIT/IRON -FREE) TABS Take 1 tablet by mouth every evening.    [provider]  RABEprazole  (ACIPHEX ) 20 MG tablet Take 1 tablet (20 mg total) by mouth 2 (two) times daily before a meal. 12/23/22   Mahon, Charmaine CROME, NP  sodium chloride  (OCEAN) 0.65 % SOLN nasal spray Place 1 spray into both nostrils as needed for congestion.    [provider]  traMADol  (ULTRAM ) 50 MG tablet Take 50-100 mg by mouth every 8 (eight) hours as needed for moderate pain. 09/20/16   [provider]    Past Medical History:  Diagnosis Date   Arthritis    Cardiac murmur    Cervical spine disease    Cirrhosis (HCC)    likely secondary to NASH   Diabetes mellitus  without complication (HCC)    diet controlled   Exogenous hyperlipidemia    GERD (gastroesophageal reflux disease)    History of kidney stones    HTN (hypertension)    Morbid obesity due to excess calories (HCC)    Other chronic pain    Panic disorder    Patent foramen ovale    TEE 2018   Prediabetes    Sleep apnea    Stroke (HCC)    mini-stroke no deficits from this.   Unspecified asthma with (acute) exacerbation    Vitamin D deficiency     Past Surgical History:  Procedure Laterality Date   BIOPSY  03/05/2018    Procedure: BIOPSY;  Surgeon: Shaaron Lamar HERO, MD;  [facility];  Service: Endoscopy;;  gastric   CHOLECYSTECTOMY     COLONOSCOPY WITH PROPOFOL  N/A 03/05/2018   Dr. Shaaron: Diverticulosis in the sigmoid and descending colon.  3 colon polyps removed, 2 hyperplastic and one sessile serrated polyp.  Next colonoscopy 3 years.   COLONOSCOPY WITH PROPOFOL  N/A 12/27/2022   Procedure: COLONOSCOPY WITH PROPOFOL ;  Surgeon: Cinderella Deatrice FALCON, MD;  [facility]: [facility];  Service: Endoscopy;  Laterality: N/A;   ESOPHAGOGASTRODUODENOSCOPY (EGD) WITH PROPOFOL  N/A 03/05/2018   Dr. Shaaron: Erosive reflux esophagitis, small hiatal hernia, erosive gastropathy with reactive gastropathy on biopsy.  No H. pylori.   ESOPHAGOGASTRODUODENOSCOPY (EGD) WITH PROPOFOL  N/A 12/27/2022   Procedure: ESOPHAGOGASTRODUODENOSCOPY (EGD) WITH PROPOFOL ;  Surgeon: Cinderella Deatrice FALCON, MD;  [facility];  Service: Endoscopy;  Laterality: N/A;   HERNIA REPAIR     umbilical   HOT HEMOSTASIS  12/27/2022   Procedure: HOT HEMOSTASIS (ARGON PLASMA COAGULATION/BICAP);  Surgeon: Cinderella Deatrice FALCON, MD;  [facility];  Service: Endoscopy;;   POLYPECTOMY  03/05/2018   Procedure: POLYPECTOMY;  Surgeon: Shaaron Lamar HERO, MD;  [facility];  Service: Endoscopy;;  colon   SHOULDER ARTHROSCOPY Right    TEE WITHOUT CARDIOVERSION  2018   patent foramen ovale   TONSILLECTOMY AND ADENOIDECTOMY     TUBAL LIGATION       reports that she has been smoking cigarettes. She has a 20 pack-year smoking history. She has never used smokeless tobacco. She reports that she does not drink alcohol  and does not use drugs.  Family History  Problem Relation Age of Onset   Breast cancer Maternal Aunt    Colon cancer Other        maternal great grandfather   Liver disease Brother        Fatty liver   HIV Brother    Hypertension Mother    Hyperlipidemia Mother    Hypertension Father    Thyroid  disease Sister    Pancreatic  cancer Sister    Healthy Daughter    Healthy Son    Healthy Son    Heart disease Son      Physical Exam: Vitals:   04/06/23 0524 04/06/23 0600 04/06/23 0651 04/06/23 0700  BP:  (!) 118/50  (!) 102/40  Pulse:  89 90 88  Resp:   19 20  Temp: 99.9 F (37.7 C)  99.2 F (37.3 C)   TempSrc: Oral  Oral   SpO2:  94% 96% 94%  Weight:      Height:        Gen: Awake, alert, NAD   CV: Regular, normal S1, S2, no murmurs  Resp: Normal WOB, CTAB  Abd: Obese, hyperactive, nontender MSK: Symmetric, no edema  Skin: No  rashes or lesions to exposed skin  Neuro: Alert and interactive  Psych: euthymic, appropriate    Data review:   Labs reviewed, notable for:   Lactate 1.8 -> 4.3 Chemistries unremarkable WBC 16, hemoglobin 7.1, microcytic.  Micro:  Results for orders placed or performed during the hospital encounter of 04/05/23  Blood culture (routine x 2)     Status: None (Preliminary result)   Collection Time: 04/05/23  9:12 PM   Specimen: Left Antecubital; Blood  Result Value Ref Range Status   Specimen Description LEFT ANTECUBITAL  Final   Special Requests BOTTLES DRAWN AEROBIC AND ANAEROBIC  Final   Culture   Final    NO GROWTH < 12 HOURS Performed at [facility], [address], [place], [place] [address]    Report Status PENDING  Incomplete  Blood culture (routine x 2)     Status: None (Preliminary result)   Collection Time: 04/05/23  9:24 PM   Specimen: BLOOD RIGHT HAND  Result Value Ref Range Status   Specimen Description BLOOD RIGHT HAND  Final   Special Requests BOTTLES DRAWN AEROBIC AND ANAEROBIC  Final   Culture   Final    NO GROWTH < 12 HOURS Performed at [facility], [address] [place], [place] [address]    Report Status PENDING  Incomplete  Resp panel by RT-PCR (RSV, Flu A&B, Covid) Anterior Nasal Swab     Status: None   Collection Time: 04/05/23  9:45 PM   Specimen: Anterior Nasal Swab  Result Value Ref Range Status   SARS Coronavirus 2 by RT PCR  NEGATIVE NEGATIVE Final    Comment: (NOTE) SARS-CoV-2 target nucleic acids are NOT DETECTED.  The SARS-CoV-2 RNA is generally detectable in upper respiratory specimens during the acute phase of infection. The lowest concentration of SARS-CoV-2 viral copies this assay can detect is 138 copies/mL. A negative result does not preclude SARS-Cov-2 infection and should not be used as the sole basis for treatment or other patient management decisions. A negative result may occur with  improper specimen collection/handling, submission of specimen other than nasopharyngeal swab, presence of viral mutation(s) within the areas targeted by this assay, and inadequate number of viral copies(<138 copies/mL). A negative result must be combined with clinical observations, patient history, and epidemiological information. The expected result is Negative.  Fact Sheet for Patients:  bloggercourse.com  Fact Sheet for Healthcare Providers:  seriousbroker.it  This test is no t yet approved or cleared by the [place]  FDA and  has been authorized for detection and/or diagnosis of SARS-CoV-2 by FDA under an Emergency Use Authorization (EUA). This EUA will remain  in effect (meaning this test can be used) for the duration of the COVID-19 declaration under Section 564(b)(1) of the Act, 21 U.S.C.section 360bbb-3(b)(1), unless the authorization is terminated  or revoked sooner.       Influenza A by PCR NEGATIVE NEGATIVE Final   Influenza B by PCR NEGATIVE NEGATIVE Final    Comment: (NOTE) The Xpert Xpress SARS-CoV-2/FLU/RSV plus assay is intended as an aid in the diagnosis of influenza from Nasopharyngeal swab specimens and should not be used as a sole basis for treatment. Nasal washings and aspirates are unacceptable for Xpert Xpress SARS-CoV-2/FLU/RSV testing.  Fact Sheet for Patients: bloggercourse.com  Fact Sheet for  Healthcare Providers: seriousbroker.it  This test is not yet approved or cleared by the [place]  FDA and has been authorized for detection and/or diagnosis of SARS-CoV-2 by FDA under an Emergency Use Authorization (EUA).  This EUA will remain in effect (meaning this test can be used) for the duration of the COVID-19 declaration under Section 564(b)(1) of the Act, 21 U.S.C. section 360bbb-3(b)(1), unless the authorization is terminated or revoked.     Resp Syncytial Virus by PCR NEGATIVE NEGATIVE Final    Comment: (NOTE) Fact Sheet for Patients: bloggercourse.com  Fact Sheet for Healthcare Providers: seriousbroker.it  This test is not yet approved or cleared by the [place]  FDA and has been authorized for detection and/or diagnosis of SARS-CoV-2 by FDA under an Emergency Use Authorization (EUA). This EUA will remain in effect (meaning this test can be used) for the duration of the COVID-19 declaration under Section 564(b)(1) of the Act, 21 U.S.C. section 360bbb-3(b)(1), unless the authorization is terminated or revoked.  Performed at [facility], [address] [place], [place] [address]     Imaging reviewed:  CT ABDOMEN PELVIS W CONTRAST Result Date: 04/05/2023 CLINICAL DATA:  Left lower quadrant pain with diarrhea, initial encounter EXAM: CT ABDOMEN AND PELVIS WITH CONTRAST TECHNIQUE: Multidetector CT imaging of the abdomen and pelvis was performed using the standard protocol following bolus administration of intravenous contrast. RADIATION DOSE REDUCTION: This exam was performed according to the departmental dose-optimization program which includes automated exposure control, adjustment of the mA and/or kV according to patient size and/or use of iterative reconstruction technique. CONTRAST:  OMNIPAQUE  IOHEXOL  300 MG/ML  SOLN COMPARISON:  None Available. FINDINGS: Lower chest: No acute  abnormality. Hepatobiliary: Liver shows mild nodularity consistent with underlying cirrhosis. No discrete mass is seen. The gallbladder has been surgically removed. Pancreas: Unremarkable. No pancreatic ductal dilatation or surrounding inflammatory changes. Spleen: Normal in size without focal abnormality. Adrenals/Urinary Tract: Adrenal glands are within normal limits. Kidneys are well visualized bilaterally. Tiny nonobstructing left renal stone is noted in the upper pole. Scattered simple cysts are seen. No follow-up is recommended. No obstructive changes are seen. The bladder is partially distended. Stomach/Bowel: Scattered diverticular change of the colon is noted. No evidence of diverticulitis is seen. The appendix is not well visualized. Some very mild inflammatory changes noted surrounding the cecum. Mild wall thickening is noted in the cecum consistent with focal ascending colitis. Small bowel is within normal limits. Stomach is unremarkable. Vascular/Lymphatic: Aortic atherosclerosis. No enlarged abdominal or pelvic lymph nodes. Reproductive: Uterus and bilateral adnexa are unremarkable. Other: Changes of prior hernia repair are noted. A recurrent fat containing hernia is noted anteriorly in part due to diastasis of the rectus muscles. Musculoskeletal: Chronic L1 compression fracture is noted. IMPRESSION: Changes of hepatic cirrhosis. Tiny nonobstructing left renal stone. Diverticulosis without diverticulitis. Mild ascending colitis. Electronically Signed   By: Oneil Devonshire M.D.   On: 04/05/2023 23:28   DG Chest 2 View Result Date: 04/05/2023 CLINICAL DATA:  Fever and diarrhea EXAM: CHEST - 2 VIEW COMPARISON:  02/06/2023 FINDINGS: Stable cardiomediastinal silhouette. Aortic atherosclerotic calcification. Bibasilar atelectasis. Otherwise no focal consolidation, pleural effusion, or pneumothorax. No displaced rib fractures. Similar superior endplate compression fracture of L1. IMPRESSION: No acute  cardiopulmonary disease. Electronically Signed   By: Norman Gatlin M.D.   On: 04/05/2023 20:43    ED Course:  Treated with vancomycin , cefepime , Flagyl , 1 L IV fluid and started on 150 cc/h, Tylenol , Zofran .   Assessment/Plan:  63 y.o. female with hx Nash cirrhosis, GAVE, history of upper GI bleeding, iron  deficiency anemia, allergic/anaphylactic transfusion reaction, hypertension, diabetes, who presents due to 2-day history of lightheadedness, presyncope in setting of recent GI illness.  Found to  be septic with source likely colitis.  Ascending colitis Sepsis secondary to above, present on admission, improving Due to history of diarrhea predominant illness.  On initial ED evaluation tachycardic, febrile to 30 9.6C max, WBC 16.  Lactate within normal limit and downtrending with fluids.  CT demonstrating mild wall thickening in the ascending colon consistent with colitis. -Continue cefepime , Flagyl .   -Follow-up blood cultures, GI pathogen, C. Difficile; contact plus until ruled out -Stop vancomycin  as is likely intra-abdominal infection. -Status post 1 L IV fluid with normalization in lactate.  Hold off on additional large-volume fluid resuscitation as lactate normal.  Continue maintenance IV fluid LR at 100 cc/h until taking adequate p.o.  Acute on Chronic anemia, IDA History allergic transfusion reaction Possible acute on chronic UGIB  History none IDA likely chronic GI bleeding from GAVE.  History [facility] on previous admission.  In 9/' 24 blood transfusion complicated by allergic/anaphylactic reaction.  Hemoglobin 7.1, dropped to 6.3, suspect delusional with fluids.  However other cell lines similar.  Had reported coffee-ground material in stool but states brown particulate matter, possibly having upper GI bleeding. -Considering drop in hemoglobin start on pantoprazole  40 mg IV every 12 hours -Had discussed her borderline hemoglobin admission and history transfusion reaction, patient would  like to hold off on transfusion unless necessary.  Will discuss worsening hemoglobin trend with patient and decide on next steps.  Consider wash RBC and pretreatment with Benadryl . -Check type and screen, repeat CBC this afternoon -If continued hemoglobin drop consider GI consult for possible EGD.  Chronic medical problems: Hollie cirrhosis: c/b GAVE and chronic upper GI bleeding per above.  Outpatient hepatology follow-up Hypertension: Hold home lisinopril in setting of sepsis Diabetes: Hold home metformin .  SSI while inpatient Chronic pain: Given her history of cirrhosis home dosing  of Tramadol  is above recommended daily dose.  Reduce to 25 to 50 mg every 8 hour for moderate/severe   Body mass index is 40.21 kg/m.  Obesity class III affecting medical care per above  DVT prophylaxis:  SCDs Code Status:  Full Code Diet:  Diet Orders (From admission, onward)     Start     Ordered   04/06/23 0140  Diet 2 gram sodium Room service appropriate? Yes; Fluid consistency: Thin  Diet effective now       Question Answer Comment  Room service appropriate? Yes   Fluid consistency: Thin      04/06/23 0140           Family Communication: No Consults: None Admission status:   Inpatient, Telemetry bed  Severity of Illness: The appropriate patient status for this patient is INPATIENT. Inpatient status is judged to be reasonable and necessary in order to provide the required intensity of service to ensure the patient's safety. The patient's presenting symptoms, physical exam findings, and initial radiographic and laboratory data in the context of their chronic comorbidities is felt to place them at high risk for further clinical deterioration. Furthermore, it is not anticipated that the patient will be medically stable for discharge from the hospital within 2 midnights of admission.   * I certify that at the point of admission it is my clinical judgment that the patient will require inpatient  hospital care spanning beyond 2 midnights from the point of admission due to high intensity of service, high risk for further deterioration and high frequency of surveillance required.*   Dorn Dawson, MD [facility]  How to contact the [facility] Attending or Consulting provider 7A -  7P or covering provider during after hours 7P -7A, for this patient.  Check the care team in [facility] and look for a) attending/consulting [facility] provider listed and b) the [facility] team listed Log into [email or web address] and use [place]'s universal password to access. If you do not have the password, please contact the hospital operator. Locate the [facility] provider you are looking for under [facility] and page to a number that you can be directly reached. If you still have difficulty reaching the provider, please page the [facility] (Director on Call) for the Hospitalists listed on amion for assistance.  04/06/2023, 7:39 AM

## 2023-04-06 NOTE — Plan of Care (Signed)

## 2023-04-06 NOTE — ED Notes (Signed)
 Pt ambulated to [facility]. Hat provided for stool sample collection.

## 2023-04-06 NOTE — ED Notes (Signed)
 ED TO INPATIENT HANDOFF REPORT  ED Nurse Name and Phone #: [phone]  S Name/Age/Gender Wendy Kramer 63 y.o. female Room/Bed: APA12/APA12  Code Status   Code Status: Full Code  Home/SNF/Other Home Patient oriented to: self, place, time, and situation Is this baseline? Yes   Triage Complete: Triage complete  Chief Complaint Sepsis [facility]) [A41.9]  Triage Note Pt reports:  Diarrhea Started today Nausea Started today Weakness Started today    Allergies Allergies  Allergen Reactions   Asa [Aspirin] Anaphylaxis    Tolerates other NSAIDS   Augmentin [Amoxicillin-Pot Clavulanate] Nausea And Vomiting and Other (See Comments)    Syncope   Levaquin [Levofloxacin In D5w] Itching and Other (See Comments)    body turned red   Lovenox [Enoxaparin Sodium] Other (See Comments)    physician instructed not to use ever again. white veins popped out    Level of Care/Admitting Diagnosis ED Disposition     ED Disposition  Admit   Condition  --   Comment  Hospital Area: [facility] [[id number]]  Level of Care: Telemetry [5]  Covid Evaluation: Confirmed COVID Negative  Diagnosis: Sepsis [facility]) [[id number]]  Admitting Physician: SEGARS, JONATHAN [[id number]]  Attending Physician: SEGARS, JONATHAN [[id number]]  Certification:: I certify this patient will need inpatient services for at least 2 midnights  Expected Medical Readiness: 04/08/2023          B Medical/Surgery History Past Medical History:  Diagnosis Date   Arthritis    Cardiac murmur    Cervical spine disease    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   Unspecified asthma with (acute) exacerbation    Vitamin D deficiency    Past Surgical History:  Procedure Laterality Date   BIOPSY  03/05/2018   Procedure: BIOPSY;  Surgeon: Shaaron Lamar HERO, MD;  [facility];  Service: Endoscopy;;  gastric   CHOLECYSTECTOMY     COLONOSCOPY WITH PROPOFOL  N/A 03/05/2018   Dr. Shaaron: Diverticulosis in the sigmoid and descending colon.  3 colon polyps removed, 2 hyperplastic and one sessile serrated polyp.  Next colonoscopy 3 years.   COLONOSCOPY WITH PROPOFOL  N/A 12/27/2022   Procedure: COLONOSCOPY WITH PROPOFOL ;  Surgeon: Cinderella Deatrice FALCON, MD;  [facility];  Service: Endoscopy;  Laterality: N/A;   ESOPHAGOGASTRODUODENOSCOPY (EGD) WITH PROPOFOL  N/A 03/05/2018   Dr. Shaaron: Erosive reflux esophagitis, small hiatal hernia, erosive gastropathy with reactive gastropathy on biopsy.  No H. pylori.   ESOPHAGOGASTRODUODENOSCOPY (EGD) WITH PROPOFOL  N/A 12/27/2022   Procedure: ESOPHAGOGASTRODUODENOSCOPY (EGD) WITH PROPOFOL ;  Surgeon: Cinderella Deatrice FALCON, MD;  [facility];  Service: Endoscopy;  Laterality: N/A;   HERNIA REPAIR     umbilical   HOT HEMOSTASIS  12/27/2022   Procedure: HOT HEMOSTASIS (ARGON PLASMA COAGULATION/BICAP);  Surgeon: Cinderella Deatrice FALCON, MD;  [facility];  Service: Endoscopy;;   POLYPECTOMY  03/05/2018   Procedure: POLYPECTOMY;  Surgeon: Shaaron Lamar HERO, MD;  [facility];  Service:  Endoscopy;;  colon   SHOULDER ARTHROSCOPY Right    TEE WITHOUT CARDIOVERSION  2018   patent foramen ovale   TONSILLECTOMY AND ADENOIDECTOMY     TUBAL LIGATION       A IV Location/Drains/Wounds Patient Lines/Drains/Airways Status     Active Line/Drains/Airways     Name Placement date Placement time Site Days   Peripheral IV 04/05/23 20 G Right Antecubital 04/05/23  2115  Antecubital  1            Intake/Output Last 24 hours  Intake/Output Summary (Last 24  hours) at 04/06/2023 1212 Last data filed at 04/06/2023 1138 Gross per 24 hour  Intake 1300.31 ml  Output --  Net 1300.31 ml    Labs/Imaging Results for orders placed or performed during the hospital encounter of 04/05/23 (from the past 48 hours)  Lactic acid, plasma     Status: None   Collection Time: 04/05/23  9:12 PM  Result Value Ref Range   Lactic Acid, Venous 1.8 0.5 - 1.9 mmol/L    Comment: Performed at [facility], [address], [place], [place] [address]  Comprehensive metabolic panel     Status: Abnormal   Collection Time: 04/05/23  9:12 PM  Result Value Ref Range   Sodium 135 135 - 145 mmol/L   Potassium 3.9 3.5 - 5.1 mmol/L   Chloride 102 98 - 111 mmol/L   CO2 23 22 - 32 mmol/L   Glucose, Bld 172 (H) 70 - 99 mg/dL    Comment: Glucose reference range applies only to samples taken after fasting for at least 8 hours.   BUN 14 8 - 23 mg/dL   Creatinine, Ser 9.25 0.44 - 1.00 mg/dL   Calcium 9.1 8.9 - 89.6 mg/dL   Total Protein 7.1 6.5 - 8.1 g/dL   Albumin 3.5 3.5 - 5.0 g/dL   AST 33 15 - 41 U/L   ALT 19 0 - 44 U/L   Alkaline Phosphatase 81 38 - 126 U/L   Total Bilirubin 0.9 0.0 - 1.2 mg/dL   GFR, Estimated >39 >39 mL/min    Comment: (NOTE) Calculated using the CKD-EPI Creatinine Equation (2021)    Anion gap 10 5 - 15    Comment: Performed at [facility], [address], [place], [place] [address]  Lipase, blood     Status: None   Collection Time: 04/05/23  9:12 PM  Result Value Ref Range   Lipase 32 11 - 51 U/L    Comment: Performed at [facility], [address], [place], [place] [address]  Blood culture (routine x 2)     Status: None (Preliminary result)   Collection Time: 04/05/23  9:12 PM   Specimen: Left Antecubital; Blood  Result Value Ref Range   Specimen Description LEFT ANTECUBITAL    Special Requests BOTTLES DRAWN AEROBIC AND ANAEROBIC    Culture      NO GROWTH < 12 HOURS Performed at [facility], [address], [place], [place] [address]     Report Status PENDING   CBC with Differential     Status: Abnormal   Collection Time: 04/05/23  9:13 PM  Result Value Ref Range   WBC 16.4 (H) 4.0 - 10.5 K/uL   RBC 3.99 3.87 - 5.11 MIL/uL   Hemoglobin 7.1 (L) 12.0 - 15.0 g/dL   HCT 73.6 (L) 63.9 - 53.9 %   MCV 65.9 (L) 80.0 - 100.0 fL   MCH 17.8 (L) 26.0 - 34.0 pg  MCHC 27.0 (L) 30.0 - 36.0 g/dL   RDW 80.8 (H) 88.4 - 84.4 %   Platelets 167 150 - 400 K/uL   nRBC 0.0 0.0 - 0.2 %   Neutrophils Relative % 90 %   Neutro Abs 14.8 (H) 1.7 - 7.7 K/uL   Lymphocytes Relative 4 %   Lymphs Abs 0.6 (L) 0.7 - 4.0 K/uL   Monocytes Relative 5 %   Monocytes Absolute 0.8 0.1 - 1.0 K/uL   Eosinophils Relative 0 %   Eosinophils Absolute 0.0 0.0 - 0.5 K/uL   Basophils Relative 0 %   Basophils Absolute 0.0 0.0 - 0.1 K/uL   Immature Granulocytes 1 %   Abs Immature Granulocytes 0.09 (H) 0.00 - 0.07 K/uL    Comment: Performed at [facility], [address], [place], [place] [address]  Urinalysis, w/ Reflex to Culture (Infection Suspected) -Urine, Clean Catch     Status: Abnormal   Collection Time: 04/05/23  9:15 PM  Result Value Ref Range   Specimen Source URINE, CLEAN CATCH    Color, Urine YELLOW YELLOW   APPearance HAZY (A) CLEAR   Specific Gravity, Urine 1.017 1.005 - 1.030   pH 6.0 5.0 - 8.0   Glucose, UA NEGATIVE NEGATIVE mg/dL   Hgb urine dipstick NEGATIVE NEGATIVE   Bilirubin Urine NEGATIVE NEGATIVE   Ketones, ur NEGATIVE NEGATIVE mg/dL   Protein, ur NEGATIVE NEGATIVE mg/dL   Nitrite NEGATIVE NEGATIVE   Leukocytes,Ua NEGATIVE NEGATIVE   RBC / HPF 0-5 0 - 5 RBC/hpf   WBC, UA 0-5 0 - 5 WBC/hpf    Comment:        Reflex urine culture not performed if WBC <=10, OR if Squamous epithelial cells >5. If Squamous epithelial cells >5 suggest recollection.    Bacteria, UA RARE (A) NONE SEEN   Squamous Epithelial / HPF 21-50 0 - 5 /HPF   Mucus PRESENT     Comment: Performed at [facility], [address] [place], [place] [address]  Blood  culture (routine x 2)     Status: None (Preliminary result)   Collection Time: 04/05/23  9:24 PM   Specimen: BLOOD RIGHT HAND  Result Value Ref Range   Specimen Description BLOOD RIGHT HAND    Special Requests BOTTLES DRAWN AEROBIC AND ANAEROBIC    Culture      NO GROWTH < 12 HOURS Performed at [facility], [address] [place], [place] [address]    Report Status PENDING   Resp panel by RT-PCR (RSV, Flu A&B, Covid) Anterior Nasal Swab     Status: None   Collection Time: 04/05/23  9:45 PM   Specimen: Anterior Nasal Swab  Result Value Ref Range   SARS Coronavirus 2 by RT PCR NEGATIVE NEGATIVE    Comment: (NOTE) SARS-CoV-2 target nucleic acids are NOT DETECTED.  The SARS-CoV-2 RNA is generally detectable in upper respiratory specimens during the acute phase of infection. The lowest concentration of SARS-CoV-2 viral copies this assay can detect is 138 copies/mL. A negative result does not preclude SARS-Cov-2 infection and should not be used as the sole basis for treatment or other patient management decisions. A negative result may occur with  improper specimen collection/handling, submission of specimen other than nasopharyngeal swab, presence of viral mutation(s) within the areas targeted by this assay, and inadequate number of viral copies(<138 copies/mL). A negative result must be combined with clinical observations, patient history, and epidemiological information. The expected result is Negative.  Fact Sheet for Patients:  bloggercourse.com  Fact Sheet for Healthcare Providers:  seriousbroker.it  This test is no t yet approved or cleared by the [place]  FDA and  has been authorized for detection and/or diagnosis of SARS-CoV-2 by FDA under an Emergency Use Authorization (EUA). This EUA will remain  in effect (meaning this test can be used) for the duration of the COVID-19 declaration under Section 564(b)(1) of the  Act, 21 U.S.C.section 360bbb-3(b)(1), unless the authorization is terminated  or revoked sooner.       Influenza A by PCR NEGATIVE NEGATIVE   Influenza B by PCR NEGATIVE NEGATIVE    Comment: (NOTE) The Xpert Xpress SARS-CoV-2/FLU/RSV plus assay is intended as an aid in the diagnosis of influenza from Nasopharyngeal swab specimens and should not be used as a sole basis for treatment. Nasal washings and aspirates are unacceptable for Xpert Xpress SARS-CoV-2/FLU/RSV testing.  Fact Sheet for Patients: bloggercourse.com  Fact Sheet for Healthcare Providers: seriousbroker.it  This test is not yet approved or cleared by the [place]  FDA and has been authorized for detection and/or diagnosis of SARS-CoV-2 by FDA under an Emergency Use Authorization (EUA). This EUA will remain in effect (meaning this test can be used) for the duration of the COVID-19 declaration under Section 564(b)(1) of the Act, 21 U.S.C. section 360bbb-3(b)(1), unless the authorization is terminated or revoked.     Resp Syncytial Virus by PCR NEGATIVE NEGATIVE    Comment: (NOTE) Fact Sheet for Patients: bloggercourse.com  Fact Sheet for Healthcare Providers: seriousbroker.it  This test is not yet approved or cleared by the [place]  FDA and has been authorized for detection and/or diagnosis of SARS-CoV-2 by FDA under an Emergency Use Authorization (EUA). This EUA will remain in effect (meaning this test can be used) for the duration of the COVID-19 declaration under Section 564(b)(1) of the Act, 21 U.S.C. section 360bbb-3(b)(1), unless the authorization is terminated or revoked.  Performed at [facility], [address] [place], [place] [address]   Lactic acid, plasma     Status: None   Collection Time: 04/05/23 11:26 PM  Result Value Ref Range   Lactic Acid, Venous 1.3 0.5 - 1.9 mmol/L    Comment:  Performed at [facility], [address], [place], [place] [address]  Basic metabolic panel     Status: Abnormal   Collection Time: 04/06/23  5:29 AM  Result Value Ref Range   Sodium 133 (L) 135 - 145 mmol/L   Potassium 4.0 3.5 - 5.1 mmol/L   Chloride 104 98 - 111 mmol/L   CO2 23 22 - 32 mmol/L   Glucose, Bld 171 (H) 70 - 99 mg/dL    Comment: Glucose reference range applies only to samples taken after fasting for at least 8 hours.   BUN 15 8 - 23 mg/dL   Creatinine, Ser 9.11 0.44 - 1.00 mg/dL   Calcium 8.1 (L) 8.9 - 10.3 mg/dL   GFR, Estimated >39 >39 mL/min    Comment: (NOTE) Calculated using the CKD-EPI Creatinine Equation (2021)    Anion gap 6 5 - 15    Comment: Performed at [facility], [address], [place], [place] [address]  CBC     Status: Abnormal   Collection Time: 04/06/23  5:29 AM  Result Value Ref Range   WBC 17.2 (H) 4.0 - 10.5 K/uL   RBC 3.48 (L) 3.87 - 5.11 MIL/uL   Hemoglobin 6.3 (LL) 12.0 - 15.0 g/dL    Comment: REPEATED TO VERIFY THIS CRITICAL RESULT HAS  VERIFIED AND BEEN CALLED TO JASPER BY LATISHA HENDERSON ON 01 05 2025 AT 0612, AND HAS BEEN READ BACK.     HCT 23.0 (L) 36.0 - 46.0 %   MCV 66.1 (L) 80.0 - 100.0 fL   MCH 18.1 (L) 26.0 - 34.0 pg   MCHC 27.4 (L) 30.0 - 36.0 g/dL   RDW 80.7 (H) 88.4 - 84.4 %   Platelets 153 150 - 400 K/uL   nRBC 0.0 0.0 - 0.2 %    Comment: Performed at [facility], [address] [place], [place] [address]  Magnesium      Status: Abnormal   Collection Time: 04/06/23  5:29 AM  Result Value Ref Range   Magnesium  1.4 (L) 1.7 - 2.4 mg/dL    Comment: Performed at [facility], [address] [place], [place] [address]  Phosphorus     Status: None   Collection Time: 04/06/23  5:29 AM  Result Value Ref Range   Phosphorus 3.6 2.5 - 4.6 mg/dL    Comment: Performed at [facility], [address], [place], [place] [address]  Type and screen [facility]     Status: None (Preliminary result)   Collection Time: 04/06/23   8:24 AM  Result Value Ref Range   ABO/RH(D) O POS    Antibody Screen NEG    Sample Expiration      04/09/2023,2359 Performed at [facility], [address], [place], [place] [address]    Unit Number [id number]    Blood Component Type RED CELLS,LR    Unit division 00    Status of Unit ALLOCATED    Transfusion Status OK TO TRANSFUSE    Crossmatch Result Compatible   CBG monitoring, ED     Status: Abnormal   Collection Time: 04/06/23  8:27 AM  Result Value Ref Range   Glucose-Capillary 167 (H) 70 - 99 mg/dL    Comment: Glucose reference range applies only to samples taken after fasting for at least 8 hours.  CBC     Status: Abnormal   Collection Time: 04/06/23 11:55 AM  Result Value Ref Range   WBC 15.0 (H) 4.0 - 10.5 K/uL   RBC 3.54 (L) 3.87 - 5.11 MIL/uL   Hemoglobin 6.2 (LL) 12.0 - 15.0 g/dL    Comment: REPEATED TO VERIFY THIS CRITICAL RESULT HAS VERIFIED AND BEEN CALLED TO EANES BY LATISHA HENDERSON ON 01 05 2025 AT 1209, AND HAS BEEN READ BACK.     HCT 23.6 (L) 36.0 - 46.0 %   MCV 66.7 (L) 80.0 - 100.0 fL   MCH 17.5 (L) 26.0 - 34.0 pg   MCHC 26.3 (L) 30.0 - 36.0 g/dL   RDW 80.5 (H) 88.4 - 84.4 %   Platelets 149 (L) 150 - 400 K/uL   nRBC 0.0 0.0 - 0.2 %    Comment: Performed at [facility], [address], [place], [place] [address]  Prepare RBC (crossmatch)     Status: None   Collection Time: 04/06/23 11:55 AM  Result Value Ref Range   Order Confirmation      ORDER PROCESSED BY BLOOD BANK Performed at [facility], [address], [place], [place] [address]    CT ABDOMEN PELVIS W CONTRAST Result Date: 04/05/2023 CLINICAL DATA:  Left lower quadrant pain with diarrhea, initial encounter EXAM: CT ABDOMEN AND PELVIS WITH CONTRAST TECHNIQUE: Multidetector CT imaging of the abdomen and pelvis was performed using the standard protocol following bolus administration of intravenous contrast. RADIATION DOSE REDUCTION: This  exam was performed according to the departmental  dose-optimization program which includes automated exposure control, adjustment of the mA and/or kV according to patient size and/or use of iterative reconstruction technique. CONTRAST:  OMNIPAQUE  IOHEXOL  300 MG/ML  SOLN COMPARISON:  None Available. FINDINGS: Lower chest: No acute abnormality. Hepatobiliary: Liver shows mild nodularity consistent with underlying cirrhosis. No discrete mass is seen. The gallbladder has been surgically removed. Pancreas: Unremarkable. No pancreatic ductal dilatation or surrounding inflammatory changes. Spleen: Normal in size without focal abnormality. Adrenals/Urinary Tract: Adrenal glands are within normal limits. Kidneys are well visualized bilaterally. Tiny nonobstructing left renal stone is noted in the upper pole. Scattered simple cysts are seen. No follow-up is recommended. No obstructive changes are seen. The bladder is partially distended. Stomach/Bowel: Scattered diverticular change of the colon is noted. No evidence of diverticulitis is seen. The appendix is not well visualized. Some very mild inflammatory changes noted surrounding the cecum. Mild wall thickening is noted in the cecum consistent with focal ascending colitis. Small bowel is within normal limits. Stomach is unremarkable. Vascular/Lymphatic: Aortic atherosclerosis. No enlarged abdominal or pelvic lymph nodes. Reproductive: Uterus and bilateral adnexa are unremarkable. Other: Changes of prior hernia repair are noted. A recurrent fat containing hernia is noted anteriorly in part due to diastasis of the rectus muscles. Musculoskeletal: Chronic L1 compression fracture is noted. IMPRESSION: Changes of hepatic cirrhosis. Tiny nonobstructing left renal stone. Diverticulosis without diverticulitis. Mild ascending colitis. Electronically Signed   By: Oneil Devonshire M.D.   On: 04/05/2023 23:28   DG Chest 2 View Result Date: 04/05/2023 CLINICAL DATA:  Fever and diarrhea EXAM: CHEST - 2 VIEW COMPARISON:  02/06/2023  FINDINGS: Stable cardiomediastinal silhouette. Aortic atherosclerotic calcification. Bibasilar atelectasis. Otherwise no focal consolidation, pleural effusion, or pneumothorax. No displaced rib fractures. Similar superior endplate compression fracture of L1. IMPRESSION: No acute cardiopulmonary disease. Electronically Signed   By: Norman Gatlin M.D.   On: 04/05/2023 20:43    Pending Labs Unresulted Labs (From admission, onward)     Start     Ordered   04/07/23 0500  CBC with Differential/Platelet  Tomorrow morning,   R        04/06/23 1027   04/07/23 0500  Basic metabolic panel  Tomorrow morning,   R        04/06/23 1027   04/06/23 1800  Hemoglobin and hematocrit, blood  Once,   R        04/06/23 1026   04/06/23 0139  Gastrointestinal Panel by PCR , Stool  (Gastrointestinal Panel by PCR, Stool                                                                                                                                                     **Does Not include CLOSTRIDIUM DIFFICILE testing. **If CDIFF testing is needed, place order from  the C Difficile Testing order set.**)  Once,   R        04/06/23 0140   04/06/23 0031  C Difficile Quick Screen w PCR reflex  (C Difficile quick screen w PCR reflex panel )  Once, for 24 hours,   URGENT       References:    CDiff Information Tool   04/06/23 0031            Vitals/Pain Today's Vitals   04/06/23 0700 04/06/23 0727 04/06/23 1000 04/06/23 1043  BP: (!) 102/40  (!) 110/44   Pulse: 88  91   Resp: 20  (!) 22   Temp:    99 F (37.2 C)  TempSrc:    Oral  SpO2: 94%  93%   Weight:      Height:      PainSc:  Asleep      Isolation Precautions Enteric precautions (UV disinfection)  Medications Medications  sodium chloride  flush (NS) 0.9 % injection 3 mL (3 mLs Intravenous Given 04/06/23 1110)  albuterol  (PROVENTIL ) (2.5 MG/3ML) 0.083% nebulizer solution 2.5 mg (has no administration in time range)  ondansetron  (ZOFRAN ) injection 4 mg  (has no administration in time range)  melatonin tablet 6 mg (has no administration in time range)  ceFEPIme  (MAXIPIME ) 2 g in sodium chloride  0.9 % 100 mL IVPB (0 g Intravenous Stopped 04/06/23 1138)  metroNIDAZOLE  (FLAGYL ) tablet 500 mg (500 mg Oral Given 04/06/23 1106)  fluticasone  furoate-vilanterol (BREO ELLIPTA ) 200-25 MCG/ACT 1 puff (1 puff Inhalation Given 04/06/23 0933)  insulin  aspart (novoLOG ) injection 0-6 Units (1 Units Subcutaneous Given 04/06/23 0842)  traMADol  (ULTRAM ) tablet 25 mg (has no administration in time range)    Or  traMADol  (ULTRAM ) tablet 50 mg (has no administration in time range)  acetaminophen  (TYLENOL ) tablet 500 mg (500 mg Oral Given 04/06/23 0348)  lactated ringers  infusion ( Intravenous New Bag/Given 04/06/23 0849)  pantoprazole  (PROTONIX ) injection 40 mg (40 mg Intravenous Given 04/06/23 1106)  0.9 %  sodium chloride  infusion (Manually program via Guardrails IV Fluids) (has no administration in time range)  acetaminophen  (TYLENOL ) tablet 650 mg (has no administration in time range)  diphenhydrAMINE  (BENADRYL ) injection 25 mg (has no administration in time range)  ondansetron  (ZOFRAN ) injection 4 mg (4 mg Intravenous Given 04/05/23 2128)  sodium chloride  0.9 % bolus 1,000 mL (0 mLs Intravenous Stopped 04/05/23 2300)  acetaminophen  (TYLENOL ) tablet 1,000 mg (1,000 mg Oral Given 04/05/23 2128)  iohexol  (OMNIPAQUE ) 300 MG/ML solution 100 mL (100 mLs Intravenous Contrast Given 04/05/23 2306)  ceFEPIme  (MAXIPIME ) 2 g in sodium chloride  0.9 % 100 mL IVPB (0 g Intravenous Stopped 04/06/23 0100)  metroNIDAZOLE  (FLAGYL ) IVPB 500 mg (0 mg Intravenous Stopped 04/06/23 0100)  magnesium  sulfate IVPB 2 g 50 mL (2 g Intravenous New Bag/Given 04/06/23 1108)    Mobility walks with device     Focused Assessments     R Recommendations: See Admitting Provider Note  Report given to:   Additional Notes:

## 2023-04-06 NOTE — ED Notes (Signed)
 Pt ambulated to restroom with standby assist. Pt displayed dyspnea with exertion upon returning to treatment room.

## 2023-04-06 NOTE — ED Notes (Signed)
 Hospitalist at bedside

## 2023-04-06 NOTE — Progress Notes (Signed)
   04/06/23 1931  Provider Notification  Provider Name/Title Dr. Keturah  Date Provider Notified 04/06/23  Time Provider Notified 1932  Notification Reason Critical Result  Test performed and critical result hgb 6.9  Date Critical Result Received 04/06/23  Time Critical Result Received 1925  Provider response Other (Comment) (monitor for bleeding)  Date of Provider Response 04/06/23  Time of Provider Response 1933

## 2023-04-06 NOTE — Progress Notes (Signed)
 [facility]                                                                               Wendy Kramer, is a 63 y.o. female, DOB - 06-24-1960, FMW:[id number] Admit date - 04/05/2023    Outpatient Primary MD for the patient is [name], Tesfaye Demissie, MD  LOS - 0  days    Brief summary   Wendy Kramer is a 63 y.o. female with hx of Hollie cirrhosis, GAVE, history of upper GI bleeding, iron  deficiency anemia, allergic/anaphylactic transfusion reaction, hypertension, diabetes, who presents due to 2-day history of lightheadedness, presyncope .  Assessment & Plan    Assessment and Plan:    Acute Anemia of blood loss in t he setting of chronic GI bleed.   H/o IDA likely chronic GI bleeding from GAVE. History APC on previous admission.  Patient reports allergic reaction when blood transfusion was ordered.  Hemoglobin this admission is 6.3 and 6.2. 1 unit of prbc transfusion ordered with pre medications.  Recheck H&H tonight and transfuse to keep hemoglobin greater than 7.  Will request GI consult to see if she needs EGD.     Sepsis secondary to ascending colitis: Continue with IV antibiotics.  Improving physiology.   NASH cirrhosis Outpatient hepatology follow up    Hypertension Optimal BP.     Estimated body mass index is 40.21 kg/m as calculated from the following:   Height as of this encounter: 5' 3 (1.6 m).   Weight as of this encounter: 103 kg.  Code Status: full code.  DVT Prophylaxis:  SCDs Start: 04/06/23 0139   Level of Care: Level of care: Telemetry Family Communication:none at bedside.   Disposition Plan:     Remains inpatient appropriate:  clinical improvement.   Procedures:  None.   Consultants:   GI consult.   Antimicrobials:   Anti-infectives (From admission, onward)    Start     Dose/Rate Route Frequency Ordered Stop   04/06/23 1000  metroNIDAZOLE  (FLAGYL ) tablet 500 mg        500 mg Oral Every 12 hours 04/06/23 0141      04/06/23 0800  ceFEPIme  (MAXIPIME ) 2 g in sodium chloride  0.9 % 100 mL IVPB        2 g 200 mL/hr over 30 Minutes Intravenous Every 8 hours 04/06/23 0141     04/05/23 2330  ceFEPIme  (MAXIPIME ) 2 g in sodium chloride  0.9 % 100 mL IVPB        2 g 200 mL/hr over 30 Minutes Intravenous  Once 04/05/23 2322 04/06/23 0100   04/05/23 2330  metroNIDAZOLE  (FLAGYL ) IVPB 500 mg        500 mg 100 mL/hr over 60 Minutes Intravenous  Once 04/05/23 2322 04/06/23 0100   04/05/23 2330  vancomycin  (VANCOCIN ) IVPB 1000 mg/200 mL premix  Status:  Discontinued        1,000 mg 200 mL/hr over 60 Minutes Intravenous  Once 04/05/23 2322 04/06/23 0222        Medications  Scheduled Meds:  sodium chloride    Intravenous Once   fluticasone  furoate-vilanterol  1 puff Inhalation Daily   insulin  aspart  0-6 Units Subcutaneous  TID WC   metroNIDAZOLE   500 mg Oral Q12H   pantoprazole  (PROTONIX ) IV  40 mg Intravenous Q12H   sodium chloride  flush  3 mL Intravenous Q12H   Continuous Infusions:  ceFEPime  (MAXIPIME ) IV Stopped (04/06/23 1138)   lactated ringers  100 mL/hr at 04/06/23 0849   PRN Meds:.acetaminophen , albuterol , melatonin, ondansetron  (ZOFRAN ) IV, traMADol  **OR** traMADol     Subjective:   Wendy Kramer was seen and examined today.  She reports generalized abdominal pain, no vomiting or nausea,   Objective:   Vitals:   04/06/23 1043 04/06/23 1300 04/06/23 1328 04/06/23 1416  BP:  (!) 132/48 (!) 116/48 (!) 113/40  Pulse:  90 87 94  Resp:      Temp: 99 F (37.2 C)  98.3 F (36.8 C) 98.3 F (36.8 C)  TempSrc: Oral  Oral Oral  SpO2:  98% 100% 97%  Weight:      Height:        Intake/Output Summary (Last 24 hours) at 04/06/2023 1431 Last data filed at 04/06/2023 1303 Gross per 24 hour  Intake 1346.41 ml  Output --  Net 1346.41 ml   Filed Weights   04/05/23 2014  Weight: 103 kg     Exam General exam: ill appearing elderly woman, not in distress.  Respiratory system: Clear to auscultation.  Respiratory effort normal. Cardiovascular system: S1 & S2 heard, RRR. No JVD,  Gastrointestinal system: Abdomen is soft, generalized tenderness. Bs+ Central nervous system: Alert and oriented. No focal neurological deficits. Extremities: Symmetric 5 x 5 power. Skin: No rashes, lesions or ulcers Psychiatry: Judgement and insight appear normal.     Data Reviewed:  I have personally reviewed following labs and imaging studies   CBC Lab Results  Component Value Date   WBC 15.0 (H) 04/06/2023   RBC 3.54 (L) 04/06/2023   HGB 6.2 (LL) 04/06/2023   HCT 23.6 (L) 04/06/2023   MCV 66.7 (L) 04/06/2023   MCH 17.5 (L) 04/06/2023   PLT 149 (L) 04/06/2023   MCHC 26.3 (L) 04/06/2023   RDW 19.4 (H) 04/06/2023   LYMPHSABS 0.6 (L) 04/05/2023   MONOABS 0.8 04/05/2023   EOSABS 0.0 04/05/2023   BASOSABS 0.0 04/05/2023     Last metabolic panel Lab Results  Component Value Date   NA 133 (L) 04/06/2023   K 4.0 04/06/2023   CL 104 04/06/2023   CO2 23 04/06/2023   BUN 15 04/06/2023   CREATININE 0.88 04/06/2023   GLUCOSE 171 (H) 04/06/2023   GFRNONAA >60 04/06/2023   GFRAA 85 02/15/2020   CALCIUM 8.1 (L) 04/06/2023   PHOS 3.6 04/06/2023   PROT 7.1 04/05/2023   ALBUMIN 3.5 04/05/2023   LABGLOB 3.1 01/02/2023   AGRATIO 1.1 01/02/2023   BILITOT 0.9 04/05/2023   ALKPHOS 81 04/05/2023   AST 33 04/05/2023   ALT 19 04/05/2023   ANIONGAP 6 04/06/2023    CBG (last 3)  Recent Labs    04/06/23 0827 04/06/23 1232  GLUCAP 167* 207*      Coagulation Profile: No results for input(s): INR, PROTIME in the last 168 hours.   Radiology Studies: CT ABDOMEN PELVIS W CONTRAST Result Date: 04/05/2023 CLINICAL DATA:  Left lower quadrant pain with diarrhea, initial encounter EXAM: CT ABDOMEN AND PELVIS WITH CONTRAST TECHNIQUE: Multidetector CT imaging of the abdomen and pelvis was performed using the standard protocol following bolus administration of intravenous contrast. RADIATION DOSE  REDUCTION: This exam was performed according to the departmental dose-optimization program which includes automated exposure control, adjustment  of the mA and/or kV according to patient size and/or use of iterative reconstruction technique. CONTRAST:  OMNIPAQUE  IOHEXOL  300 MG/ML  SOLN COMPARISON:  None Available. FINDINGS: Lower chest: No acute abnormality. Hepatobiliary: Liver shows mild nodularity consistent with underlying cirrhosis. No discrete mass is seen. The gallbladder has been surgically removed. Pancreas: Unremarkable. No pancreatic ductal dilatation or surrounding inflammatory changes. Spleen: Normal in size without focal abnormality. Adrenals/Urinary Tract: Adrenal glands are within normal limits. Kidneys are well visualized bilaterally. Tiny nonobstructing left renal stone is noted in the upper pole. Scattered simple cysts are seen. No follow-up is recommended. No obstructive changes are seen. The bladder is partially distended. Stomach/Bowel: Scattered diverticular change of the colon is noted. No evidence of diverticulitis is seen. The appendix is not well visualized. Some very mild inflammatory changes noted surrounding the cecum. Mild wall thickening is noted in the cecum consistent with focal ascending colitis. Small bowel is within normal limits. Stomach is unremarkable. Vascular/Lymphatic: Aortic atherosclerosis. No enlarged abdominal or pelvic lymph nodes. Reproductive: Uterus and bilateral adnexa are unremarkable. Other: Changes of prior hernia repair are noted. A recurrent fat containing hernia is noted anteriorly in part due to diastasis of the rectus muscles. Musculoskeletal: Chronic L1 compression fracture is noted. IMPRESSION: Changes of hepatic cirrhosis. Tiny nonobstructing left renal stone. Diverticulosis without diverticulitis. Mild ascending colitis. Electronically Signed   By: Oneil Devonshire M.D.   On: 04/05/2023 23:28   DG Chest 2 View Result Date: 04/05/2023 CLINICAL DATA:   Fever and diarrhea EXAM: CHEST - 2 VIEW COMPARISON:  02/06/2023 FINDINGS: Stable cardiomediastinal silhouette. Aortic atherosclerotic calcification. Bibasilar atelectasis. Otherwise no focal consolidation, pleural effusion, or pneumothorax. No displaced rib fractures. Similar superior endplate compression fracture of L1. IMPRESSION: No acute cardiopulmonary disease. Electronically Signed   By: Norman Gatlin M.D.   On: 04/05/2023 20:43       Elgie Butter M.D. [facility] 04/06/2023, 2:31 PM  Available via Epic secure chat 7am-7pm After 7 pm, please refer to night coverage provider listed on amion.

## 2023-04-06 NOTE — Progress Notes (Signed)
   04/06/23 1303  TOC Brief Assessment  Insurance and Status Reviewed  Patient has primary care physician Yes  Home environment has been reviewed from home with family  Prior level of function: independent  Prior/Current Home Services No current home services  Social Drivers of Health Review SDOH reviewed no interventions necessary  Readmission risk has been reviewed Yes  Transition of care needs no transition of care needs at this time    Transition of Care Department [facility]) has reviewed patient and no TOC needs have been identified at this time. We will continue to monitor patient advancement through interdisciplinary progression rounds. If new patient transition needs arise, please place a TOC consult.

## 2023-04-06 NOTE — ED Notes (Signed)
 Pt ambulated to the bathroom.

## 2023-04-06 NOTE — ED Notes (Signed)
 Hospitalist J Segars made aware of critical hemoglobin level 6.3

## 2023-04-07 ENCOUNTER — Other Ambulatory Visit: Payer: Self-pay | Admitting: *Deleted

## 2023-04-07 DIAGNOSIS — K7581 Nonalcoholic steatohepatitis (NASH): Secondary | ICD-10-CM

## 2023-04-07 DIAGNOSIS — R000 Tachycardia, unspecified: Secondary | ICD-10-CM

## 2023-04-07 DIAGNOSIS — Z8719 Personal history of other diseases of the digestive system: Secondary | ICD-10-CM

## 2023-04-07 DIAGNOSIS — D500 Iron deficiency anemia secondary to blood loss (chronic): Secondary | ICD-10-CM

## 2023-04-07 DIAGNOSIS — D649 Anemia, unspecified: Secondary | ICD-10-CM

## 2023-04-07 DIAGNOSIS — A419 Sepsis, unspecified organism: Secondary | ICD-10-CM | POA: Diagnosis not present

## 2023-04-07 DIAGNOSIS — K7460 Unspecified cirrhosis of liver: Secondary | ICD-10-CM | POA: Diagnosis not present

## 2023-04-07 DIAGNOSIS — K529 Noninfective gastroenteritis and colitis, unspecified: Secondary | ICD-10-CM | POA: Diagnosis not present

## 2023-04-07 DIAGNOSIS — D72829 Elevated white blood cell count, unspecified: Secondary | ICD-10-CM

## 2023-04-07 LAB — BPAM RBC
Blood Product Expiration Date: 202501292359
ISSUE DATE / TIME: 202501051417
Unit Type and Rh: 5100

## 2023-04-07 LAB — CBC WITH DIFFERENTIAL/PLATELET
Abs Immature Granulocytes: 0.05 10*3/uL (ref 0.00–0.07)
Basophils Absolute: 0.1 10*3/uL (ref 0.0–0.1)
Basophils Relative: 0 %
Eosinophils Absolute: 0.4 10*3/uL (ref 0.0–0.5)
Eosinophils Relative: 3 %
HCT: 25.8 % — ABNORMAL LOW (ref 36.0–46.0)
Hemoglobin: 7.1 g/dL — ABNORMAL LOW (ref 12.0–15.0)
Immature Granulocytes: 0 %
Lymphocytes Relative: 19 %
Lymphs Abs: 2.1 10*3/uL (ref 0.7–4.0)
MCH: 19.3 pg — ABNORMAL LOW (ref 26.0–34.0)
MCHC: 27.5 g/dL — ABNORMAL LOW (ref 30.0–36.0)
MCV: 70.3 fL — ABNORMAL LOW (ref 80.0–100.0)
Monocytes Absolute: 0.8 10*3/uL (ref 0.1–1.0)
Monocytes Relative: 8 %
Neutro Abs: 7.8 10*3/uL — ABNORMAL HIGH (ref 1.7–7.7)
Neutrophils Relative %: 70 %
Platelets: 152 10*3/uL (ref 150–400)
RBC: 3.67 MIL/uL — ABNORMAL LOW (ref 3.87–5.11)
RDW: 21 % — ABNORMAL HIGH (ref 11.5–15.5)
WBC: 11.1 10*3/uL — ABNORMAL HIGH (ref 4.0–10.5)
nRBC: 0 % (ref 0.0–0.2)

## 2023-04-07 LAB — TYPE AND SCREEN
ABO/RH(D): O POS
Antibody Screen: NEGATIVE
Unit division: 0

## 2023-04-07 LAB — BASIC METABOLIC PANEL
Anion gap: 7 (ref 5–15)
BUN: 15 mg/dL (ref 8–23)
CO2: 22 mmol/L (ref 22–32)
Calcium: 8.4 mg/dL — ABNORMAL LOW (ref 8.9–10.3)
Chloride: 105 mmol/L (ref 98–111)
Creatinine, Ser: 0.91 mg/dL (ref 0.44–1.00)
GFR, Estimated: >60 mL/min (ref >60)
Glucose, Bld: 149 mg/dL — ABNORMAL HIGH (ref 70–99)
Potassium: 3.7 mmol/L (ref 3.5–5.1)
Sodium: 134 mmol/L — ABNORMAL LOW (ref 135–145)

## 2023-04-07 LAB — GLUCOSE, CAPILLARY
Glucose-Capillary: 148 mg/dL — ABNORMAL HIGH (ref 70–99)
Glucose-Capillary: 271 mg/dL — ABNORMAL HIGH (ref 70–99)
Glucose-Capillary: 167 mg/dL — ABNORMAL HIGH (ref 70–99)
Glucose-Capillary: 235 mg/dL — ABNORMAL HIGH (ref 70–99)

## 2023-04-07 LAB — IRON AND TIBC
Iron: 17 ug/dL — ABNORMAL LOW (ref 28–170)
Saturation Ratios: 4 % — ABNORMAL LOW (ref 10.4–31.8)
TIBC: 464 ug/dL — ABNORMAL HIGH (ref 250–450)
UIBC: 447 ug/dL

## 2023-04-07 LAB — FERRITIN: Ferritin: 10 ng/mL — ABNORMAL LOW (ref 11–307)

## 2023-04-07 LAB — HEMOGLOBIN AND HEMATOCRIT, BLOOD
HCT: 26.8 % — ABNORMAL LOW (ref 36.0–46.0)
Hemoglobin: 7.3 g/dL — ABNORMAL LOW (ref 12.0–15.0)

## 2023-04-07 MED ORDER — IRON SUCROSE 200 MG IVPB - SIMPLE MED
200.0000 mg | Freq: Once | Status: DC
  Filled 2023-04-07: qty 110

## 2023-04-07 MED ORDER — SODIUM CHLORIDE 0.9 % IV SOLN
200.0000 mg | Freq: Once | INTRAVENOUS | Status: AC
  Administered 2023-04-07: 200 mg via INTRAVENOUS
  Filled 2023-04-07: qty 10

## 2023-04-07 MED ORDER — FERROUS SULFATE 325 (65 FE) MG PO TABS
325.0000 mg | ORAL_TABLET | Freq: Every day | ORAL | Status: DC
  Administered 2023-04-08: 325 mg via ORAL
  Filled 2023-04-07: qty 1

## 2023-04-07 NOTE — Plan of Care (Signed)

## 2023-04-07 NOTE — Progress Notes (Signed)
 PROGRESS NOTE  Wendy Kramer  FMW:[id number] DOB: 1961/01/23 DOA: 04/05/2023 PCP: Carlette Benita Area, MD   Brief Narrative: Patient is a 63 year old female with history of Nash cirrhosis, GAVE, upper GI bleed, iron  deficiency anemia, hypertension, diabetes type 2 who presented with lightheadedness, diarrhea.  On presentation, she was tachycardic, febrile, lab work showed leukocytosis.  CT abdomen/pelvis showed colitis in the ascending colon area.  Started on antibiotics. GI consulted.  Assessment & Plan:  Principal Problem:   Sepsis (HCC) Active Problems:   Gastroesophageal reflux disease   Diabetes mellitus without complication (HCC)   Mixed hyperlipidemia   Morbid obesity (HCC)   Cirrhosis of liver without ascites (HCC)   GAVE (gastric antral vascular ectasia)   Iron  deficiency anemia   Sepsis secondary to colitis: Presented with diarrhea, tachycardic, febrile on presentation.  Leukocytosis.  CT abdomen/pelvis consistent with colitis in the ascending colon negative.  Currently on broad-spectrum antibiotics.  Follow-up GI pathogen panel, C. difficile.  Follow-up blood cultures.  Was given IV fluids on presentation, now held.  Tolerating solid diet.  Had 2 loose stools today.  Denies abdomen pain, nausea or vomiting.  Feels much better.  Acute on chronic iron  deficiency anemia/microcytic anemia: History of GAVE.  Status post APC on previous admission.   Also reported coffee-ground material in the stool.  Upper GI bleed not excluded.  Currently on Protonix .  GI following.  Given a unit of blood transfusion here.  History of transfusion reaction so was given premedications.  Continue to monitor H&H.  Transfuse if hemoglobin drops less than 7.GI following.  Iron  panel showed severe iron  deficiency, given IV iron , continue oral iron  supplementation from tomorrow  History of Hollie cirrhosis: Follows with GI as an outpatient.  History of hypertension: Blood pressure stable.  Home lisinopril  on hold.  Diabetes type 2: Takes metformin  at home.  Currently on sliding scale.  Chronic pain syndrome: Continue pain medications  Varicose veins: Has chronic bilateral lower extremity edema more on the left  Obesity: BMI 40        DVT prophylaxis:SCDs Start: 04/06/23 0139     Code Status: Full Code  Family Communication: None at the bedside  Patient status:Inpatient  Patient is from :home  Anticipated discharge un:ynfz  Estimated DC date:1-2 days   Consultants: GI  Procedures:None yet  Antimicrobials:  Anti-infectives (From admission, onward)    Start     Dose/Rate Route Frequency Ordered Stop   04/06/23 1000  metroNIDAZOLE  (FLAGYL ) tablet 500 mg        500 mg Oral Every 12 hours 04/06/23 0141     04/06/23 0800  ceFEPIme  (MAXIPIME ) 2 g in sodium chloride  0.9 % 100 mL IVPB        2 g 200 mL/hr over 30 Minutes Intravenous Every 8 hours 04/06/23 0141     04/05/23 2330  ceFEPIme  (MAXIPIME ) 2 g in sodium chloride  0.9 % 100 mL IVPB        2 g 200 mL/hr over 30 Minutes Intravenous  Once 04/05/23 2322 04/06/23 0100   04/05/23 2330  metroNIDAZOLE  (FLAGYL ) IVPB 500 mg        500 mg 100 mL/hr over 60 Minutes Intravenous  Once 04/05/23 2322 04/06/23 0100   04/05/23 2330  vancomycin  (VANCOCIN ) IVPB 1000 mg/200 mL premix  Status:  Discontinued        1,000 mg 200 mL/hr over 60 Minutes Intravenous  Once 04/05/23 2322 04/06/23 0222       Subjective: Patient seen and  examined at bedside today.  Appears comfortable.  Lying in bed.  Had 2 loose bowel movements today.  Stool still looks like coffee grounds but she denies any nausea, vomiting or abdominal pain.  Feels much better  Objective: Vitals:   04/06/23 1746 04/06/23 2205 04/07/23 0336 04/07/23 0757  BP: (!) 102/38 (!) 149/52 135/60   Pulse: 82 80 83   Resp:  20 16   Temp: 98.9 F (37.2 C) 98.3 F (36.8 C) 98.7 F (37.1 C)   TempSrc: Oral     SpO2: 98% 97% 95% 94%  Weight:   104.4 kg   Height:         Intake/Output Summary (Last 24 hours) at 04/07/2023 0925 Last data filed at 04/07/2023 0341 Gross per 24 hour  Intake 794.72 ml  Output --  Net 794.72 ml   Filed Weights   04/05/23 2014 04/07/23 0336  Weight: 103 kg 104.4 kg    Examination:  General exam: Overall comfortable, not in distress, obese HEENT: PERRL Respiratory system:  no wheezes or crackles  Cardiovascular system: S1 & S2 heard, RRR.  Gastrointestinal system: Abdomen is nondistended, soft and nontender. Central nervous system: Alert and oriented Extremities: Bilateral lower extremity edema more on the left, varicose veins, no clubbing ,no cyanosis Skin: No rashes, no ulcers,no icterus     Data Reviewed: I have personally reviewed following labs and imaging studies  CBC: Recent Labs  Lab 04/05/23 2113 04/06/23 0529 04/06/23 1155 04/06/23 1854 04/07/23 0355  WBC 16.4* 17.2* 15.0*  --  11.1*  NEUTROABS 14.8*  --   --   --  7.8*  HGB 7.1* 6.3* 6.2* 6.9* 7.1*  HCT 26.3* 23.0* 23.6* 25.4* 25.8*  MCV 65.9* 66.1* 66.7*  --  70.3*  PLT 167 153 149*  --  152   Basic Metabolic Panel: Recent Labs  Lab 04/05/23 2112 04/06/23 0529 04/07/23 0355  NA 135 133* 134*  K 3.9 4.0 3.7  CL 102 104 105  CO2 23 23 22   GLUCOSE 172* 171* 149*  BUN 14 15 15   CREATININE 0.74 0.88 0.91  CALCIUM 9.1 8.1* 8.4*  MG  --  1.4*  --   PHOS  --  3.6  --      Recent Results (from the past 240 hours)  Blood culture (routine x 2)     Status: None (Preliminary result)   Collection Time: 04/05/23  9:12 PM   Specimen: Left Antecubital; Blood  Result Value Ref Range Status   Specimen Description LEFT ANTECUBITAL  Final   Special Requests BOTTLES DRAWN AEROBIC AND ANAEROBIC  Final   Culture   Final    NO GROWTH 2 DAYS Performed at [facility], [address], [place], [place] [address]    Report Status PENDING  Incomplete  Blood culture (routine x 2)     Status: None (Preliminary result)   Collection Time: 04/05/23  9:24 PM    Specimen: BLOOD RIGHT HAND  Result Value Ref Range Status   Specimen Description BLOOD RIGHT HAND  Final   Special Requests BOTTLES DRAWN AEROBIC AND ANAEROBIC  Final   Culture   Final    NO GROWTH 2 DAYS Performed at [facility], [address] [place], [place] [address]    Report Status PENDING  Incomplete  Resp panel by RT-PCR (RSV, Flu A&B, Covid) Anterior Nasal Swab     Status: None   Collection Time: 04/05/23  9:45 PM   Specimen: Anterior Nasal Swab  Result Value Ref Range Status   SARS Coronavirus 2 by RT PCR NEGATIVE NEGATIVE Final    Comment: (NOTE) SARS-CoV-2 target nucleic acids are NOT DETECTED.  The SARS-CoV-2 RNA is generally detectable in upper respiratory specimens during the acute phase of infection. The lowest concentration of SARS-CoV-2 viral copies this assay can detect is 138 copies/mL. A negative result does not preclude SARS-Cov-2 infection and should not be used as the sole basis for treatment or other patient management decisions. A negative result may occur with  improper specimen collection/handling, submission of specimen other than nasopharyngeal swab, presence of viral mutation(s) within the areas targeted by this assay, and inadequate number of viral copies(<138 copies/mL). A negative result must be combined with clinical observations, patient history, and epidemiological information. The expected result is Negative.  Fact Sheet for Patients:  bloggercourse.com  Fact Sheet for Healthcare Providers:  seriousbroker.it  This test is no t yet approved or cleared by the [place]  FDA and  has been authorized for detection and/or diagnosis of SARS-CoV-2 by FDA under an Emergency Use Authorization (EUA). This EUA will remain  in effect (meaning this test can be used) for the duration of the COVID-19 declaration under Section 564(b)(1) of the Act, 21 U.S.C.section 360bbb-3(b)(1), unless the  authorization is terminated  or revoked sooner.       Influenza A by PCR NEGATIVE NEGATIVE Final   Influenza B by PCR NEGATIVE NEGATIVE Final    Comment: (NOTE) The Xpert Xpress SARS-CoV-2/FLU/RSV plus assay is intended as an aid in the diagnosis of influenza from Nasopharyngeal swab specimens and should not be used as a sole basis for treatment. Nasal washings and aspirates are unacceptable for Xpert Xpress SARS-CoV-2/FLU/RSV testing.  Fact Sheet for Patients: bloggercourse.com  Fact Sheet for Healthcare Providers: seriousbroker.it  This test is not yet approved or cleared by the [place]  FDA and has been authorized for detection and/or diagnosis of SARS-CoV-2 by FDA under an Emergency Use Authorization (EUA). This EUA will remain in effect (meaning this test can be used) for the duration of the COVID-19 declaration under Section 564(b)(1) of the Act, 21 U.S.C. section 360bbb-3(b)(1), unless the authorization is terminated or revoked.     Resp Syncytial Virus by PCR NEGATIVE NEGATIVE Final    Comment: (NOTE) Fact Sheet for Patients: bloggercourse.com  Fact Sheet for Healthcare Providers: seriousbroker.it  This test is not yet approved or cleared by the [place]  FDA and has been authorized for detection and/or diagnosis of SARS-CoV-2 by FDA under an Emergency Use Authorization (EUA). This EUA will remain in effect (meaning this test can be used) for the duration of the COVID-19 declaration under Section 564(b)(1) of the Act, 21 U.S.C. section 360bbb-3(b)(1), unless the authorization is terminated or revoked.  Performed at [facility], [address], [place], [place] [address]   C Difficile Quick Screen w PCR reflex     Status: None   Collection Time: 04/06/23 11:12 AM   Specimen: STOOL  Result Value Ref Range Status   C Diff antigen NEGATIVE NEGATIVE Final    C Diff toxin NEGATIVE NEGATIVE Final   C Diff interpretation No C. difficile detected.  Final    Comment: Performed at [facility], [address] [place], [place] [address]     Radiology Studies: CT ABDOMEN PELVIS W CONTRAST Result Date: 04/05/2023 CLINICAL DATA:  Left lower quadrant pain with diarrhea, initial encounter EXAM: CT ABDOMEN AND PELVIS WITH CONTRAST TECHNIQUE: Multidetector CT imaging of the abdomen and pelvis  was performed using the standard protocol following bolus administration of intravenous contrast. RADIATION DOSE REDUCTION: This exam was performed according to the departmental dose-optimization program which includes automated exposure control, adjustment of the mA and/or kV according to patient size and/or use of iterative reconstruction technique. CONTRAST:  OMNIPAQUE  IOHEXOL  300 MG/ML  SOLN COMPARISON:  None Available. FINDINGS: Lower chest: No acute abnormality. Hepatobiliary: Liver shows mild nodularity consistent with underlying cirrhosis. No discrete mass is seen. The gallbladder has been surgically removed. Pancreas: Unremarkable. No pancreatic ductal dilatation or surrounding inflammatory changes. Spleen: Normal in size without focal abnormality. Adrenals/Urinary Tract: Adrenal glands are within normal limits. Kidneys are well visualized bilaterally. Tiny nonobstructing left renal stone is noted in the upper pole. Scattered simple cysts are seen. No follow-up is recommended. No obstructive changes are seen. The bladder is partially distended. Stomach/Bowel: Scattered diverticular change of the colon is noted. No evidence of diverticulitis is seen. The appendix is not well visualized. Some very mild inflammatory changes noted surrounding the cecum. Mild wall thickening is noted in the cecum consistent with focal ascending colitis. Small bowel is within normal limits. Stomach is unremarkable. Vascular/Lymphatic: Aortic atherosclerosis. No enlarged abdominal or pelvic lymph  nodes. Reproductive: Uterus and bilateral adnexa are unremarkable. Other: Changes of prior hernia repair are noted. A recurrent fat containing hernia is noted anteriorly in part due to diastasis of the rectus muscles. Musculoskeletal: Chronic L1 compression fracture is noted. IMPRESSION: Changes of hepatic cirrhosis. Tiny nonobstructing left renal stone. Diverticulosis without diverticulitis. Mild ascending colitis. Electronically Signed   By: Oneil Devonshire M.D.   On: 04/05/2023 23:28   DG Chest 2 View Result Date: 04/05/2023 CLINICAL DATA:  Fever and diarrhea EXAM: CHEST - 2 VIEW COMPARISON:  02/06/2023 FINDINGS: Stable cardiomediastinal silhouette. Aortic atherosclerotic calcification. Bibasilar atelectasis. Otherwise no focal consolidation, pleural effusion, or pneumothorax. No displaced rib fractures. Similar superior endplate compression fracture of L1. IMPRESSION: No acute cardiopulmonary disease. Electronically Signed   By: Norman Gatlin M.D.   On: 04/05/2023 20:43    Scheduled Meds:  sodium chloride    Intravenous Once   fluticasone  furoate-vilanterol  1 puff Inhalation Daily   insulin  aspart  0-6 Units Subcutaneous TID WC   metroNIDAZOLE   500 mg Oral Q12H   pantoprazole  (PROTONIX ) IV  40 mg Intravenous Q12H   sodium chloride  flush  3 mL Intravenous Q12H   Continuous Infusions:  ceFEPime  (MAXIPIME ) IV 2 g (04/07/23 0731)     LOS: 1 day   Ivonne Mustache, MD [facility] P1/08/2023, 9:25 AM

## 2023-04-07 NOTE — Consult Note (Signed)
 @LOGO @   Referring Provider: [facility]  Primary Care Physician:  Carlette Benita Area, MD Primary Gastroenterologist:  Dr. Shaaron  Date of Admission: 04/05/23 Date of Consultation: 04/07/23  Reason for Consultation:  GI bleed, ascending colitis  HPI:  Wendy Kramer is a 63 y.o. 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  In the ER, she was febrile with a temp of 101.7, tachycardic with heart rate 97, otherwise vital signs within normal limits.  Found to have acute on chronic anemia with hemoglobin of 7.1.  CT with mild noninflammatory changes noted surrounding the cecum, mild wall thickening in the cecum consistent with focal ascending colitis.  She discharged on IV fluids, IV antibiotics.  Stool studies ordered.  C. difficile was negative.  GI pathogen panel is in process.  On 1/5, hemoglobin dropped to 6.2.  She received 1 unit PRBCs with pretreatment medications to prevent allergic reaction.  Hemoglobin improved this morning to 7.1.  Iron  panel consistent with iron  deficiency. GI now consulted due to anemia.    Today:  Colitis:  Hasn't been feeling well for about 1 week. Fatigue, diarrhea, and lack of appetite. Also with fever. Was only having a couple watery stools per day. No brbpr or melena. After she got to the ER, diarrhea seemed to get worse. Diarrhea 3-4 episodes yesterday, a lot of green, grandular stool. History of chronic loose stool mushy stool with 1 BM per day usually. Reports history of IBS. No real associated abdominal pain with diarrhea. The last day or some, some generalized  abdominal soreness.   2 Bms so far today. Still watery.   Recent antibiotics: None.  Sick contacts: None.   She does have a lot of animals. Has cats, dogs, chickens, quail, and ducks.   Acute on chronic anemia:  Taking Aciphex  20 mg BID.  No nausea, vomiting, GERD, or dysphagia.  No NSAIDs.  Has not been taking iron  as she doesn't tolerate this.  Following with Dr. MARLA and was supposed to have IV iron , but was scared about transfusion reaction, so she hasn't gone.    EGD 12/27/22: - Portal hypertensive gastropathy.  - Gastric antral vascular ectasia with bleeding. Treated with argon plasma coagulation ( APC) . Likely cause of iron  deficiency anemia  -Normal duodenal bulb and second portion of the duodenum.   Colonoscopy 12/27/22: - Diverticulosis in the left colon.  - Medium- sized lipoma in the transverse colon.  - Non- bleeding external and internal hemorrhoids.  - No specimens collected.   - Recommended repeat colonoscopy in 5 years.   Cirrhosis:  No He, LE edema or abdominal distension.     Past Medical History:  Diagnosis Date   Arthritis    Cardiac murmur    Cervical spine disease    Cirrhosis (HCC)    likely secondary to NASH   Diabetes mellitus without complication (HCC)    diet controlled   Exogenous hyperlipidemia    GERD (gastroesophageal reflux disease)    History of kidney stones    HTN (hypertension)    Morbid obesity due to excess calories (HCC)    Other chronic pain    Panic disorder  Patent foramen ovale    TEE 2018   Prediabetes    Sleep apnea    Stroke (HCC)    mini-stroke no deficits from this.   Unspecified asthma with (acute) exacerbation    Vitamin D deficiency     Past Surgical History:  Procedure Laterality Date   BIOPSY  03/05/2018   Procedure: BIOPSY;  Surgeon: Shaaron Lamar HERO, MD;  [facility]: [facility];  Service: Endoscopy;;  gastric   CHOLECYSTECTOMY     COLONOSCOPY WITH PROPOFOL  N/A 03/05/2018   Dr. Shaaron:  Diverticulosis in the sigmoid and descending colon.  3 colon polyps removed, 2 hyperplastic and one sessile serrated polyp.  Next colonoscopy 3 years.   COLONOSCOPY WITH PROPOFOL  N/A 12/27/2022   Procedure: COLONOSCOPY WITH PROPOFOL ;  Surgeon: Cinderella Deatrice FALCON, MD;  Location: [facility];  Service: Endoscopy;  Laterality: N/A;   ESOPHAGOGASTRODUODENOSCOPY (EGD) WITH PROPOFOL  N/A 03/05/2018   Dr. Shaaron: Erosive reflux esophagitis, small hiatal hernia, erosive gastropathy with reactive gastropathy on biopsy.  No H. pylori.   ESOPHAGOGASTRODUODENOSCOPY (EGD) WITH PROPOFOL  N/A 12/27/2022   Procedure: ESOPHAGOGASTRODUODENOSCOPY (EGD) WITH PROPOFOL ;  Surgeon: Cinderella Deatrice FALCON, MD;  Location: [facility];  Service: Endoscopy;  Laterality: N/A;   HERNIA REPAIR     umbilical   HOT HEMOSTASIS  12/27/2022   Procedure: HOT HEMOSTASIS (ARGON PLASMA COAGULATION/BICAP);  Surgeon: Cinderella Deatrice FALCON, MD;  Location: [facility];  Service: Endoscopy;;   POLYPECTOMY  03/05/2018   Procedure: POLYPECTOMY;  Surgeon: Shaaron Lamar HERO, MD;  Location: [facility];  Service: Endoscopy;;  colon   SHOULDER ARTHROSCOPY Right    TEE WITHOUT CARDIOVERSION  2018   patent foramen ovale   TONSILLECTOMY AND ADENOIDECTOMY     TUBAL LIGATION      Prior to Admission medications   Medication Sig Start Date End Date Taking? Authorizing Provider  acetaminophen  (TYLENOL ) 500 MG tablet Take 1,000 mg by mouth every 6 (six) hours as needed for moderate pain (pain score 4-6).   Yes [provider]  albuterol  (PROVENTIL  HFA;VENTOLIN  HFA) 108 (90 Base) MCG/ACT inhaler Inhale 1 puff into the lungs every 6 (six) hours as needed for wheezing or shortness of breath.   Yes [provider]  BREO ELLIPTA  200-25 MCG/ACT AEPB Inhale 1 puff into the lungs daily in the afternoon. 12/04/22  Yes [provider]  fluticasone  (FLONASE ) 50 MCG/ACT nasal spray Place 2 sprays into both nostrils daily as needed for allergies or  rhinitis.   Yes [provider]  lisinopril (ZESTRIL) 20 MG tablet Take 10 mg by mouth every evening.   Yes [provider]  metFORMIN  (GLUCOPHAGE ) 500 MG tablet Take 1 tablet (500 mg total) by mouth 2 (two) times daily with a meal. 01/06/23  Yes Reardon, Benton PARAS, NP  Multiple Vitamins-Minerals (ONE DAILY MULTIVIT/IRON -FREE) TABS Take 1 tablet by mouth every evening.   Yes [provider]  RABEprazole  (ACIPHEX ) 20 MG tablet Take 1 tablet (20 mg total) by mouth 2 (two) times daily before a meal. 12/23/22  Yes Mahon, Courtney L, NP  traMADol  (ULTRAM ) 50 MG tablet Take 100 mg by mouth every 8 (eight) hours as needed for moderate pain (pain score 4-6). 09/20/16  Yes [provider]  Accu-Chek Softclix Lancets lancets Use as instructed to monitor glucose 1 times daily. 11/06/22   Therisa Benton PARAS, NP  Blood Glucose Monitoring Suppl (ACCU-CHEK GUIDE ME) w/Device KIT 1 Piece by Does not apply route as directed. 11/02/19  Nida, Gebreselassie W, MD  glucose blood (ACCU-CHEK GUIDE) test strip USE TO CHECK BLOOD SUGARS ONCE DAILY 12/09/22   Therisa Benton PARAS, NP    Current Facility-Administered Medications  Medication Dose Route Frequency Provider Last Rate Last Admin   0.9 %  sodium chloride  infusion (Manually program via Guardrails IV Fluids)   Intravenous Once Cherlyn Labella, MD       acetaminophen  (TYLENOL ) tablet 500 mg  500 mg Oral Q6H PRN Keturah Carrier, MD   500 mg at 04/06/23 0348   albuterol  (PROVENTIL ) (2.5 MG/3ML) 0.083% nebulizer solution 2.5 mg  2.5 mg Nebulization Q4H PRN Keturah Carrier, MD       ceFEPIme  (MAXIPIME ) 2 g in sodium chloride  0.9 % 100 mL IVPB  2 g Intravenous Q8H Keturah Carrier, MD 200 mL/hr at 04/07/23 0731 2 g at 04/07/23 0731   [START ON 04/08/2023] ferrous sulfate  tablet 325 mg  325 mg Oral Q breakfast Jillian Buttery, MD       fluticasone  furoate-vilanterol (BREO ELLIPTA ) 200-25 MCG/ACT 1 puff  1 puff Inhalation Daily Keturah Carrier, MD    1 puff at 04/07/23 0756   insulin  aspart (novoLOG ) injection 0-6 Units  0-6 Units Subcutaneous TID WC Segars, Jonathan, MD   1 Units at 04/06/23 1745   iron  sucrose (VENOFER ) 200 mg in sodium chloride  0.9 % 100 mL IVPB  200 mg Intravenous Once Adhikari, Amrit, MD       melatonin tablet 6 mg  6 mg Oral QHS PRN Keturah Carrier, MD   6 mg at 04/06/23 2152   metroNIDAZOLE  (FLAGYL ) tablet 500 mg  500 mg Oral Q12H Segars, Jonathan, MD   500 mg at 04/07/23 9074   ondansetron  (ZOFRAN ) injection 4 mg  4 mg Intravenous Q6H PRN Keturah Carrier, MD       pantoprazole  (PROTONIX ) injection 40 mg  40 mg Intravenous Q12H Segars, Jonathan, MD   40 mg at 04/07/23 9074   sodium chloride  flush (NS) 0.9 % injection 3 mL  3 mL Intravenous Q12H Segars, Jonathan, MD   3 mL at 04/07/23 9074   traMADol  (ULTRAM ) tablet 25 mg  25 mg Oral Q8H PRN Keturah Carrier, MD       Or   traMADol  (ULTRAM ) tablet 50 mg  50 mg Oral Q8H PRN Keturah Carrier, MD        Allergies as of 04/05/2023 - Review Complete 04/05/2023  Allergen Reaction Noted   Asa [aspirin] Anaphylaxis 09/24/2016   Augmentin [amoxicillin-pot clavulanate] Other (See Comments) 09/24/2016   Levaquin [levofloxacin in d5w] Itching and Other (See Comments) 09/24/2016   Lovenox [enoxaparin sodium] Other (See Comments) 09/24/2016    Family History  Problem Relation Age of Onset   Breast cancer Maternal Aunt    Colon cancer Other        maternal great grandfather   Liver disease Brother        Fatty liver   HIV Brother    Hypertension Mother    Hyperlipidemia Mother    Hypertension Father    Thyroid  disease Sister    Pancreatic cancer Sister    Healthy Daughter    Healthy Son    Healthy Son    Heart disease Son     Social History   Socioeconomic History   Marital status: Legally Separated    Spouse name: Not on file   Number of children: Not on file   Years of education: Not on file   Highest education level: Not on file  Occupational  History    Not on file  Tobacco Use   Smoking status: Every Day    Current packs/day: 0.50    Average packs/day: 0.5 packs/day for 40.0 years (20.0 ttl pk-yrs)    Types: Cigarettes   Smokeless tobacco: Never  Vaping Use   Vaping status: Never Used  Substance and Sexual Activity   Alcohol  use: No   Drug use: No   Sexual activity: Not Currently    Birth control/protection: Post-menopausal, Surgical    Comment: tubal  Other Topics Concern   Not on file  Social History Narrative   Not on file   Social Drivers of Health   Financial Resource Strain: Low Risk  (01/28/2023)   Overall Financial Resource Strain (CARDIA)    Difficulty of Paying Living Expenses: Not very hard  Food Insecurity: No Food Insecurity (04/06/2023)   Hunger Vital Sign    Worried About Running Out of Food in the Last Year: Never true    Ran Out of Food in the Last Year: Never true  Recent Concern: Food Insecurity - Food Insecurity Present (03/18/2023)   Hunger Vital Sign    Worried About Running Out of Food in the Last Year: Never true    Ran Out of Food in the Last Year: Sometimes true  Transportation Needs: No Transportation Needs (04/06/2023)   PRAPARE - Administrator, Civil Service (Medical): No    Lack of Transportation (Non-Medical): No  Physical Activity: Insufficiently Active (02/17/2023)   Exercise Vital Sign    Days of Exercise per Week: 3 days    Minutes of Exercise per Session: 30 min  Stress: No Stress Concern Present (06/04/2022)   [employer] of Occupational Health - Occupational Stress Questionnaire    Feeling of Stress : Only a little  Social Connections: Socially Isolated (06/04/2022)   Social Connection and Isolation Panel [NHANES]    Frequency of Communication with Friends and Family: More than three times a week    Frequency of Social Gatherings with Friends and Family: More than three times a week    Attends Religious Services: Never    Database Administrator or Organizations: No     Attends Banker Meetings: Never    Marital Status: Separated  Intimate Partner Violence: Not At Risk (04/06/2023)   Humiliation, Afraid, Rape, and Kick questionnaire    Fear of Current or Ex-Partner: No    Emotionally Abused: No    Physically Abused: No    Sexually Abused: No    Review of Systems: Gen: Denies fever, chills, cold or flulike symptoms, presyncope, syncope. CV: Denies chest pain, heart palpitations.  Resp: Denies shortness of breath, cough. GI: See HPI GU : Denies urinary burning, urinary frequency, urinary incontinence.  MS: Denies joint pain. Derm: Denies rash. Psych: Denies depression, anxiety. Heme: Denies bruising, bleeding.  Physical Exam: Vital signs in last 24 hours: Temp:  [98.3 F (36.8 C)-99 F (37.2 C)] 98.7 F (37.1 C) (01/06 0336) Pulse Rate:  [79-94] 83 (01/06 0336) Resp:  [16-22] 16 (01/06 0336) BP: (102-149)/(38-60) 135/60 (01/06 0336) SpO2:  [94 %-100 %] 94 % (01/06 0757) Weight:  [104.4 kg] 104.4 kg (01/06 0336) Last BM Date : 04/07/23 General:   Alert,  Well-developed, well-nourished, pleasant and cooperative in NAD Head:  Normocephalic and atraumatic. Eyes:  Sclera clear, no icterus.   Conjunctiva pink. Ears:  Normal auditory acuity. Lungs:  Clear throughout to auscultation.   No wheezes, crackles, or rhonchi. No acute distress.  Heart:  Regular rate and rhythm; no murmurs, clicks, rubs,  or gallops. Abdomen:  Soft, nontender and nondistended. No masses, hepatosplenomegaly or hernias noted. Normal bowel sounds, without guarding, and without rebound.   Rectal:  Deferred Msk:  Symmetrical without gross deformities. Normal posture. Extremities:  Without  edema. Neurologic:  Alert and  oriented x4;  grossly normal neurologically. Skin:  Intact without significant lesions or rashes. Psych:  Normal mood and affect.  Intake/Output from previous day: 01/05 0701 - 01/06 0700 In: 794.7 [P.O.:240; I.V.:0.6; Blood:408; IV  Piggyback:146.1] Out: -  Intake/Output this shift: No intake/output data recorded.  Lab Results: Recent Labs    04/06/23 0529 04/06/23 1155 04/06/23 1854 04/07/23 0355  WBC 17.2* 15.0*  --  11.1*  HGB 6.3* 6.2* 6.9* 7.1*  HCT 23.0* 23.6* 25.4* 25.8*  PLT 153 149*  --  152   BMET Recent Labs    04/05/23 2112 04/06/23 0529 04/07/23 0355  NA 135 133* 134*  K 3.9 4.0 3.7  CL 102 104 105  CO2 23 23 22   GLUCOSE 172* 171* 149*  BUN 14 15 15   CREATININE 0.74 0.88 0.91  CALCIUM 9.1 8.1* 8.4*   LFT Recent Labs    04/05/23 2112  PROT 7.1  ALBUMIN 3.5  AST 33  ALT 19  ALKPHOS 81  BILITOT 0.9   PT/INR No results for input(s): LABPROT, INR in the last 72 hours. Hepatitis Panel No results for input(s): HEPBSAG, HCVAB, HEPAIGM, HEPBIGM in the last 72 hours. C-Diff Recent Labs    04/06/23 1112  CDIFFTOX NEGATIVE    Studies/Results: CT ABDOMEN PELVIS W CONTRAST Result Date: 04/05/2023 CLINICAL DATA:  Left lower quadrant pain with diarrhea, initial encounter EXAM: CT ABDOMEN AND PELVIS WITH CONTRAST TECHNIQUE: Multidetector CT imaging of the abdomen and pelvis was performed using the standard protocol following bolus administration of intravenous contrast. RADIATION DOSE REDUCTION: This exam was performed according to the departmental dose-optimization program which includes automated exposure control, adjustment of the mA and/or kV according to patient size and/or use of iterative reconstruction technique. CONTRAST:  OMNIPAQUE  IOHEXOL  300 MG/ML  SOLN COMPARISON:  None Available. FINDINGS: Lower chest: No acute abnormality. Hepatobiliary: Liver shows mild nodularity consistent with underlying cirrhosis. No discrete mass is seen. The gallbladder has been surgically removed. Pancreas: Unremarkable. No pancreatic ductal dilatation or surrounding inflammatory changes. Spleen: Normal in size without focal abnormality. Adrenals/Urinary Tract: Adrenal glands are within  normal limits. Kidneys are well visualized bilaterally. Tiny nonobstructing left renal stone is noted in the upper pole. Scattered simple cysts are seen. No follow-up is recommended. No obstructive changes are seen. The bladder is partially distended. Stomach/Bowel: Scattered diverticular change of the colon is noted. No evidence of diverticulitis is seen. The appendix is not well visualized. Some very mild inflammatory changes noted surrounding the cecum. Mild wall thickening is noted in the cecum consistent with focal ascending colitis. Small bowel is within normal limits. Stomach is unremarkable. Vascular/Lymphatic: Aortic atherosclerosis. No enlarged abdominal or pelvic lymph nodes. Reproductive: Uterus and bilateral adnexa are unremarkable. Other: Changes of prior hernia repair are noted. A recurrent fat containing hernia is noted anteriorly in part due to diastasis of the rectus muscles. Musculoskeletal: Chronic L1 compression fracture is noted. IMPRESSION: Changes of hepatic cirrhosis. Tiny nonobstructing left renal stone. Diverticulosis without diverticulitis. Mild ascending colitis. Electronically Signed   By: Oneil Devonshire M.D.   On: 04/05/2023 23:28   DG Chest 2 View Result Date: 04/05/2023 CLINICAL DATA:  Fever and  diarrhea EXAM: CHEST - 2 VIEW COMPARISON:  02/06/2023 FINDINGS: Stable cardiomediastinal silhouette. Aortic atherosclerotic calcification. Bibasilar atelectasis. Otherwise no focal consolidation, pleural effusion, or pneumothorax. No displaced rib fractures. Similar superior endplate compression fracture of L1. IMPRESSION: No acute cardiopulmonary disease. Electronically Signed   By: Norman Gatlin M.D.   On: 04/05/2023 20:43    Impression: 63 y.o. 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  She is admitted with secondary to ascending colitis.  Also with acute on chronic anemia.  GI consulted for anemia.  Acute on chronic anemia:  Prior EGD and colonoscopy in September 2024 during admission for acute on chronic anemia remarkable for PHG, GAVE with bleeding s/p APC, diverticulosis in the left colon, medium sized lipoma in the transverse colon, nonbleeding external and internal hemorrhoids.  Hemoglobin was 7.7 at the time of discharged and remained stable/slightly improved to 8.4 on 11/7.  In the ER on 1/4, hemoglobin was 7.1 and declined to 6.2 yesterday.  She received 1 unit PRBCs with pretreatment meds and tolerated this well.  Hemoglobin improved to 7.1 this morning.  She denies overt GI bleeding.  No NSAID use.  She has been compliant with PPI outpatient.  She was to receive IV iron  outpatient with hematology, but has not done so as she was worried about transfusion reaction.  She is scheduled to receive IV iron  today.  I suspect acute on chronic anemia is likely secondary to GAVE/PHG.  She was previously advised to have repeat EGD in 4 to 6 weeks, but is not scheduled until 1/29.  Would be reasonable to pursue EGD this admission she required PRBCs.  This can be pursued tomorrow as she ate a regular diet this morning.  Sepsis secondary to ascending colitis:  Presented with fever, tachycardia, leukocytosis, CT consistent with ascending colitis.  Etiology unclear.  C. difficile negative.  GI pathogen panel pending.  Colonoscopy up-to-date in September 2024.  She is clinically improving with empiric IV antibiotics.  Afebrile today.  Leukocytosis improving.  No real change in diarrhea thus far.   NASH cirrhosis: Remains clinically well compensated despite known PHG with bleeding and acute on  chronic anemia as per above.  No evidence of HE, ascites, lower extremity edema. MELD 3.0 was 6 in September. Will update INR tomorrow morning.  She is already on cefepime  for colitis which would also cover for SBP prophylaxis.    Plan: INR with morning labs tomorrow.  Plan for EGD with propofol  with Dr. Eartha tomorrow. N.p.o. at midnight. Continue IV PPI twice daily. Continue to monitor H&H and transfuse for hemoglobin less than 7. Monitor for overt GI bleeding. Agree with IV iron  as ordered by hospitalist. Continue empiric antibiotics for colitis. Follow-up on stool studies. No need for inpatient colonoscopy at this time.    LOS: 1 day    04/07/2023, 11:07 AM   Josette Centers, [facility]

## 2023-04-08 ENCOUNTER — Inpatient Hospital Stay (HOSPITAL_COMMUNITY): Payer: Medicare Other | Admitting: Anesthesiology

## 2023-04-08 ENCOUNTER — Encounter: Payer: Self-pay | Admitting: *Deleted

## 2023-04-08 ENCOUNTER — Other Ambulatory Visit: Payer: Self-pay | Admitting: *Deleted

## 2023-04-08 ENCOUNTER — Encounter (HOSPITAL_COMMUNITY)
Admission: EM | Disposition: A | Discharge status: Home / Self Care | Payer: Self-pay | Source: Home / Self Care | Attending: Internal Medicine

## 2023-04-08 ENCOUNTER — Encounter (HOSPITAL_COMMUNITY): Payer: Self-pay | Admitting: Internal Medicine

## 2023-04-08 ENCOUNTER — Ambulatory Visit: Payer: Medicare Other | Admitting: Nurse Practitioner

## 2023-04-08 ENCOUNTER — Inpatient Hospital Stay: Payer: Medicare Other

## 2023-04-08 ENCOUNTER — Telehealth: Payer: Self-pay | Admitting: *Deleted

## 2023-04-08 DIAGNOSIS — A419 Sepsis, unspecified organism: Secondary | ICD-10-CM | POA: Diagnosis not present

## 2023-04-08 DIAGNOSIS — E119 Type 2 diabetes mellitus without complications: Secondary | ICD-10-CM

## 2023-04-08 DIAGNOSIS — I10 Essential (primary) hypertension: Secondary | ICD-10-CM

## 2023-04-08 DIAGNOSIS — F17210 Nicotine dependence, cigarettes, uncomplicated: Secondary | ICD-10-CM

## 2023-04-08 DIAGNOSIS — D500 Iron deficiency anemia secondary to blood loss (chronic): Secondary | ICD-10-CM

## 2023-04-08 DIAGNOSIS — K31819 Angiodysplasia of stomach and duodenum without bleeding: Secondary | ICD-10-CM

## 2023-04-08 DIAGNOSIS — D649 Anemia, unspecified: Secondary | ICD-10-CM

## 2023-04-08 HISTORY — PX: ESOPHAGOGASTRODUODENOSCOPY (EGD) WITH PROPOFOL: SHX5813

## 2023-04-08 LAB — GASTROINTESTINAL PANEL BY PCR, STOOL (REPLACES STOOL CULTURE)

## 2023-04-08 LAB — PROTIME-INR
INR: 1.1 (ref 0.8–1.2)
Prothrombin Time: 14.5 s (ref 11.4–15.2)

## 2023-04-08 LAB — GLUCOSE, CAPILLARY
Glucose-Capillary: 122 mg/dL — ABNORMAL HIGH (ref 70–99)
Glucose-Capillary: 119 mg/dL — ABNORMAL HIGH (ref 70–99)

## 2023-04-08 LAB — HEMOGLOBIN AND HEMATOCRIT, BLOOD
HCT: 27 % — ABNORMAL LOW (ref 36.0–46.0)
Hemoglobin: 7.4 g/dL — ABNORMAL LOW (ref 12.0–15.0)

## 2023-04-08 SURGERY — ESOPHAGOGASTRODUODENOSCOPY (EGD) WITH PROPOFOL
Anesthesia: General

## 2023-04-08 MED ORDER — PROPOFOL 10 MG/ML IV BOLUS
INTRAVENOUS | Status: AC
  Filled 2023-04-08: qty 20

## 2023-04-08 MED ORDER — LIDOCAINE HCL (PF) 2 % IJ SOLN
INTRAMUSCULAR | Status: AC
  Filled 2023-04-08: qty 5

## 2023-04-08 MED ORDER — SODIUM CHLORIDE 0.9 % IV SOLN
INTRAVENOUS | Status: DC
Start: 2023-04-08 — End: 2023-04-08

## 2023-04-08 MED ORDER — LIDOCAINE HCL (PF) 2 % IJ SOLN
INTRAMUSCULAR | Status: DC | PRN
  Administered 2023-04-08: 100 mg

## 2023-04-08 MED ORDER — GLUCAGON HCL RDNA (DIAGNOSTIC) 1 MG IJ SOLR
INTRAMUSCULAR | Status: DC | PRN
  Administered 2023-04-08: .5 mg via INTRAVENOUS

## 2023-04-08 MED ORDER — GLUCAGON HCL RDNA (DIAGNOSTIC) 1 MG IJ SOLR
INTRAMUSCULAR | Status: AC
  Filled 2023-04-08: qty 1

## 2023-04-08 MED ORDER — FERROUS SULFATE 325 (65 FE) MG PO TABS: 325.0000 mg | ORAL_TABLET | Freq: Every day | ORAL | 0 refills | Status: DC

## 2023-04-08 MED ORDER — PROPOFOL 10 MG/ML IV BOLUS
INTRAVENOUS | Status: DC | PRN
  Administered 2023-04-08: 50 mg via INTRAVENOUS
  Administered 2023-04-08: 100 mg via INTRAVENOUS

## 2023-04-08 MED ORDER — LACTATED RINGERS IV SOLN: INTRAVENOUS | Status: DC

## 2023-04-08 MED ORDER — PANTOPRAZOLE SODIUM 40 MG PO TBEC: 40.0000 mg | DELAYED_RELEASE_TABLET | Freq: Two times a day (BID) | ORAL | 0 refills | Status: DC

## 2023-04-08 NOTE — Anesthesia Preprocedure Evaluation (Signed)
 Anesthesia Evaluation  Patient identified by MRN, date of birth, ID band Patient awake    Reviewed: Allergy & Precautions, H&P , NPO status , Patient's Chart, lab work & pertinent test results, reviewed documented beta blocker date and time   Airway Mallampati: II  TM Distance: >3 FB Neck ROM: full    Dental no notable dental hx. (+) Edentulous Upper, Edentulous Lower   Pulmonary asthma , sleep apnea , Current Smoker and Patient abstained from smoking.   Pulmonary exam normal breath sounds clear to auscultation       Cardiovascular Exercise Tolerance: Good hypertension, + Valvular Problems/Murmurs  Rhythm:regular Rate:Normal     Neuro/Psych  PSYCHIATRIC DISORDERS Anxiety     CVA    GI/Hepatic Neg liver ROS,GERD  ,,  Endo/Other  diabetes    Renal/GU negative Renal ROS  negative genitourinary   Musculoskeletal   Abdominal   Peds  Hematology  (+) Blood dyscrasia, anemia   Anesthesia Other Findings   Reproductive/Obstetrics negative OB ROS                              Anesthesia Physical Anesthesia Plan  ASA: 3  Anesthesia Plan: General   Post-op Pain Management:    Induction:   PONV Risk Score and Plan: Propofol infusion  Airway Management Planned:   Additional Equipment:   Intra-op Plan:   Post-operative Plan:   Informed Consent: I have reviewed the patients History and Physical, chart, labs and discussed the procedure including the risks, benefits and alternatives for the proposed anesthesia with the patient or authorized representative who has indicated his/her understanding and acceptance.     Dental Advisory Given  Plan Discussed with: CRNA  Anesthesia Plan Comments:         Anesthesia Quick Evaluation

## 2023-04-08 NOTE — Telephone Encounter (Signed)
 Toni Amend, you can hold off on arranging CBC.   Hematology office reach me today to notify me that they have an appointment with her next week for updated blood work and IV iron.

## 2023-04-08 NOTE — Telephone Encounter (Signed)
 Noted.

## 2023-04-08 NOTE — Care Management Important Message (Signed)
 Important Message  Patient Details  Name: Wendy Kramer MRN: [id number] Date of Birth: October 12, 1960   Important Message Given:  N/A - LOS <3 / Initial given by admissions     Corey Harold 04/08/2023, 11:06 AM

## 2023-04-08 NOTE — Plan of Care (Signed)
  Problem: Coping: Goal: Ability to adjust to condition or change in health will improve Outcome: Progressing   Problem: Fluid Volume: Goal: Ability to maintain a balanced intake and output will improve Outcome: Not Met (add Reason)   Problem: Health Behavior/Discharge Planning: Goal: Ability to identify and utilize available resources and services will improve Outcome: Progressing Goal: Ability to manage health-related needs will improve Outcome: Not Met (add Reason)   Problem: Nutritional: Goal: Maintenance of adequate nutrition will improve Outcome: Not Met (add Reason)

## 2023-04-08 NOTE — Telephone Encounter (Signed)
 Spoke with pt and she has been scheduled for procedure on 2/19. She stated no one has made her HFU appt yet. Went ahead and scheduled her with Baxter Hire.

## 2023-04-08 NOTE — Telephone Encounter (Signed)
 It's fine to go ahead and get her on the books with Dr. Jena Gauss.   Toni Amend, lets arrange CBC in 1 week. Dx: Anemia

## 2023-04-08 NOTE — Transfer of Care (Signed)
 Immediate Anesthesia Transfer of Care Note  Patient: Wendy Kramer  Procedure(s) Performed: ESOPHAGOGASTRODUODENOSCOPY (EGD) WITH PROPOFOL   Patient Location: PACU  Anesthesia Type:General  Level of Consciousness: awake, alert , and oriented  Airway & Oxygen Therapy: Patient Spontanous Breathing  Post-op Assessment: Report given to RN and Post -op Vital signs reviewed and stable  Post vital signs: Reviewed and stable  Last Vitals:  Vitals Value Taken Time  BP    Temp    Pulse    Resp    SpO2      Last Pain:  Vitals:   04/08/23 1227  TempSrc:   PainSc: 0-No pain         Complications: No notable events documented.

## 2023-04-08 NOTE — Progress Notes (Signed)

## 2023-04-08 NOTE — Brief Op Note (Addendum)
 04/08/2023  1:02 PM  PATIENT:  Wendy Kramer  63 y.o. female  PRE-OPERATIVE DIAGNOSIS:  acute on chronic anemia, history of bleeding GAVE, PHG, cirrhosis  POST-OPERATIVE DIAGNOSIS:  GAVE  PROCEDURE:  Procedure(s): ESOPHAGOGASTRODUODENOSCOPY (EGD) WITH PROPOFOL  (N/A)  SURGEON:  Surgeons and Role:    * Eartha Angelia Sieving, MD - Primary  Patient underwent EGD under propofol  sedation.  Tolerated the procedure adequately.   FINDINGS: - Normal esophagus.  - Portal hypertensive gastropathy.  - Gastric antral vascular ectasia without bleeding.  Treated with argon plasma coagulation (APC).  - Normal examined duodenum.   RECOMMENDATIONS - Return patient to hospital ward for ongoing care.  - Resume previous diet.  - Repeat upper endoscopy in 4 weeks for surveillance.  - Use Protonix  (pantoprazole ) 40 mg PO BID.  - IV iron  while in-house  Sieving Eartha, MD Gastroenterology and Hepatology [facility] Gastroenterology

## 2023-04-08 NOTE — Op Note (Addendum)
 [facility] Patient Name: Wendy Kramer Procedure Date: 04/08/2023 12:16 PM MRN: [id number] Date of Birth: April 24, 1960 Attending MD: Toribio Fortune , , [id number] CSN: [id number] Age: 63 Admit Type: Outpatient Procedure:                Upper GI endoscopy Indications:              Iron  deficiency anemia, Watermelon stomach (GAVE                            syndrome) Providers:                Toribio Fortune, Leandrew Edelman RN, RN, Jon Loge Referring MD:              Medicines:                Monitored Anesthesia Care Complications:            No immediate complications. Estimated Blood Loss:     Estimated blood loss: none. Procedure:                Pre-Anesthesia Assessment:                           - Prior to the procedure, a History and Physical                            was performed, and patient medications, allergies                            and sensitivities were reviewed. The patient's                            tolerance of previous anesthesia was reviewed.                           - The risks and benefits of the procedure and the                            sedation options and risks were discussed with the                            patient. All questions were answered and informed                            consent was obtained.                           - ASA Grade Assessment: III - A patient with severe                            systemic disease.                           After obtaining informed consent, the endoscope was  passed under direct vision. Throughout the                            procedure, the patient's blood pressure, pulse, and                            oxygen saturations were monitored continuously. The                            GIF-H190 ([id number]) scope was introduced through the                            mouth, and advanced to the second part of duodenum.                             The upper GI endoscopy was accomplished without                            difficulty. The patient tolerated the procedure                            well. Scope In: 12:32:54 PM Scope Out: 12:45:09 PM Total Procedure Duration: 0 hours 12 minutes 15 seconds  Findings:      The examined esophagus was normal.      Moderate portal hypertensive gastropathy was found in the gastric fundus       and in the gastric body.      Moderate gastric antral vascular ectasia without bleeding was present in       the gastric antrum. Coagulation for bleeding prevention using argon       plasma at 0.3 liters/minute and 20 watts was successful.      The examined duodenum was normal. Impression:               - Normal esophagus.                           - Portal hypertensive gastropathy.                           - Gastric antral vascular ectasia without bleeding.                            Treated with argon plasma coagulation (APC).                           - Normal examined duodenum.                           - No specimens collected. Moderate Sedation:      Per Anesthesia Care Recommendation:           - Return patient to hospital ward for ongoing care.                           - Resume previous diet.                           -  Repeat upper endoscopy in 4 weeks for                            surveillance.                           - Use Protonix  (pantoprazole ) 40 mg PO BID.                           - IV iron  while in-house Procedure Code(s):        --- Professional ---                           [phone], Esophagogastroduodenoscopy, flexible,                            transoral; with control of bleeding, any method Diagnosis Code(s):        --- Professional ---                           K76.6, Portal hypertension                           K31.89, Other diseases of stomach and duodenum                           K31.819, Angiodysplasia of stomach and duodenum                            without  bleeding                           D50.9, Iron  deficiency anemia, unspecified CPT copyright 2022 [employer]. All rights reserved. The codes documented in this report are preliminary and upon coder review may  be revised to meet current compliance requirements. Toribio Fortune, MD Toribio Fortune,  04/08/2023 1:04:27 PM This report has been signed electronically. Number of Addenda: 0

## 2023-04-08 NOTE — Discharge Summary (Addendum)
 Physician Discharge Summary  Wendy Kramer FMW:[id number] DOB: 01/25/1961 DOA: 04/05/2023  PCP: Carlette Benita Area, MD  Admit date: 04/05/2023 Discharge date: 04/08/2023  Admitted From: Home Disposition:  Home  Discharge Condition:Stable CODE STATUS:FULL Diet recommendation: Heart Healthy   Brief/Interim Summary: Patient is a 63 year old female with history of [place] cirrhosis, GAVE, upper GI bleed, iron  deficiency anemia, hypertension, diabetes type 2 who presented with lightheadedness, diarrhea.  On presentation, she was tachycardic, febrile, lab work showed leukocytosis.  CT abdomen/pelvis showed colitis in the ascending colon area.  Started on antibiotics. GI consulted.  GI pathogen panel showed Campylobacter.  Her diarrhea has stopped.  Abdominal pain completely resolved.  GI did endoscopic procedure for evaluation of her anemia.  Found to have portal hypertensive gastropathy, GAVE, treated with APC.  Medically stable for discharge home today.  She will follow-up with gastroenterology as an outpatient.  Following problems were addressed during the hospitalization:  Sepsis secondary to colitis: Presented with diarrhea, tachycardic, febrile on presentation.  Leukocytosis was present.  CT abdomen/pelvis consistent with colitis in the ascending colon .  Started on broad-spectrum antibiotics.  Was given IV fluids on presentation.  GI symptoms have resolved.  No nausea, vomiting or abdominal pain.  Diarrhea has resolved.  Blood culture has remained negative.  C. difficile negative.  GI pathogen panel showed Campylobacter.  Since her GI symptoms all resolved and she was already treated with 3 days of IV antibiotics, she does not need  antibiotics to be continued on discharge   Acute on chronic iron  deficiency anemia/microcytic anemia: History of GAVE.  Status post APC on previous admission.   Also reported coffee-ground material in the stool.  Upper GI bleed not excluded.  GI following.  Given a unit  of blood transfusion here.  History of transfusion reaction so was given premedications.  .  Iron  panel showed severe iron  deficiency, given IV iron , continue oral iron  supplementation . GI did endoscopic procedure for evaluation of her anemia.  Found to have portal hypertensive gastropathy, GAVE, treated with APC.  History of Holl[place] cirrhosis: Follows with GI as an outpatient.   History of hypertension: Blood pressure stable.  Home lisinopril can be continued on dc   Diabetes type 2: Takes metformin  at home.     Chronic pain syndrome: Continue pain medications   Varicose veins: Has chronic bilateral lower extremity edema more on the left.Stable   Obesity: BMI 40  Discharge Diagnoses:  Principal Problem:   Sepsis (HCC) Active Problems:   Gastroesophageal reflux disease   Diabetes mellitus without complication (HCC)   Mixed hyperlipidemia   Morbid obesity (HCC)   Cirrhosis of liver without ascites (HCC)   GAVE (gastric antral vascular ectasia)   Iron  deficiency anemia   Colitis    Discharge Instructions  Discharge Instructions     Diet - low sodium heart healthy   Complete by: As directed    Discharge instructions   Complete by: As directed    1)Please take your medications as instructed 2)Follow up with your PCP in a week and do a CBC test to check your hemoglobin 3)Follow up with gastroenterology as an outpatient.  You will be called for appointment   Increase activity slowly   Complete by: As directed       Allergies as of 04/08/2023       Reactions   Asa [aspirin] Anaphylaxis   Tolerates other NSAIDS   Augmentin [amoxicillin-pot Clavulanate] Nausea And Vomiting, Other (See Comments)   Syncope  Levaquin [levofloxacin In D5w] Itching, Other (See Comments)   body turned red   Lovenox [enoxaparin Sodium] Other (See Comments)   physician instructed not to use ever again. white veins popped out        Medication List     STOP taking these medications     RABEprazole  20 MG tablet Commonly known as: ACIPHEX        TAKE these medications    Accu-Chek Guide Me w/Device Kit 1 Piece by Does not apply route as directed.   Accu-Chek Guide test strip Generic drug: glucose blood USE TO CHECK BLOOD SUGARS ONCE DAILY   Accu-Chek Softclix Lancets lancets Use as instructed to monitor glucose 1 times daily.   acetaminophen  500 MG tablet Commonly known as: TYLENOL  Take 1,000 mg by mouth every 6 (six) hours as needed for moderate pain (pain score 4-6).   albuterol  108 (90 Base) MCG/ACT inhaler Commonly known as: VENTOLIN  HFA Inhale 1 puff into the lungs every 6 (six) hours as needed for wheezing or shortness of breath.   Breo Ellipta  200-25 MCG/ACT Aepb Generic drug: fluticasone  furoate-vilanterol Inhale 1 puff into the lungs daily in the afternoon.   ferrous sulfate  325 (65 FE) MG tablet Take 1 tablet (325 mg total) by mouth daily with breakfast. Start taking on: April 09, 2023   fluticasone  50 MCG/ACT nasal spray Commonly known as: FLONASE  Place 2 sprays into both nostrils daily as needed for allergies or rhinitis.   lisinopril 20 MG tablet Commonly known as: ZESTRIL Take 10 mg by mouth every evening.   metFORMIN  500 MG tablet Commonly known as: GLUCOPHAGE  Take 1 tablet (500 mg total) by mouth 2 (two) times daily with a meal.   One Daily Multivit/Iron -Free Tabs Take 1 tablet by mouth every evening.   pantoprazole  40 MG tablet Commonly known as: Protonix  Take 1 tablet (40 mg total) by mouth 2 (two) times daily.   traMADol  50 MG tablet Commonly known as: ULTRAM  Take 100 mg by mouth every 8 (eight) hours as needed for moderate pain (pain score 4-6).        Follow-up Information     [name], Benita Area, MD. Schedule an appointment as soon as possible for a visit in 1 week(s).   Specialty: Internal Medicine Contact information: [address] [place] [address] [phone]                 Allergies  Allergen Reactions   Asa [Aspirin] Anaphylaxis    Tolerates other NSAIDS   Augmentin [Amoxicillin-Pot Clavulanate] Nausea And Vomiting and Other (See Comments)    Syncope   Levaquin [Levofloxacin In D5w] Itching and Other (See Comments)    body turned red   Lovenox [Enoxaparin Sodium] Other (See Comments)    physician instructed not to use ever again. white veins popped out    Consultations: GI   Procedures/Studies: CT ABDOMEN PELVIS W CONTRAST Result Date: 04/05/2023 CLINICAL DATA:  Left lower quadrant pain with diarrhea, initial encounter EXAM: CT ABDOMEN AND PELVIS WITH CONTRAST TECHNIQUE: Multidetector CT imaging of the abdomen and pelvis was performed using the standard protocol following bolus administration of intravenous contrast. RADIATION DOSE REDUCTION: This exam was performed according to the departmental dose-optimization program which includes automated exposure control, adjustment of the mA and/or kV according to patient size and/or use of iterative reconstruction technique. CONTRAST:  OMNIPAQUE  IOHEXOL  300 MG/ML  SOLN COMPARISON:  None Available. FINDINGS: Lower chest: No acute abnormality. Hepatobiliary: Liver shows mild nodularity consistent with  underlying cirrhosis. No discrete mass is seen. The gallbladder has been surgically removed. Pancreas: Unremarkable. No pancreatic ductal dilatation or surrounding inflammatory changes. Spleen: Normal in size without focal abnormality. Adrenals/Urinary Tract: Adrenal glands are within normal limits. Kidneys are well visualized bilaterally. Tiny nonobstructing left renal stone is noted in the upper pole. Scattered simple cysts are seen. No follow-up is recommended. No obstructive changes are seen. The bladder is partially distended. Stomach/Bowel: Scattered diverticular change of the colon is noted. No evidence of diverticulitis is seen. The appendix is not well visualized. Some very mild inflammatory changes noted  surrounding the cecum. Mild wall thickening is noted in the cecum consistent with focal ascending colitis. Small bowel is within normal limits. Stomach is unremarkable. Vascular/Lymphatic: Aortic atherosclerosis. No enlarged abdominal or pelvic lymph nodes. Reproductive: Uterus and bilateral adnexa are unremarkable. Other: Changes of prior hernia repair are noted. A recurrent fat containing hernia is noted anteriorly in part due to diastasis of the rectus muscles. Musculoskeletal: Chronic L1 compression fracture is noted. IMPRESSION: Changes of hepatic cirrhosis. Tiny nonobstructing left renal stone. Diverticulosis without diverticulitis. Mild ascending colitis. Electronically Signed   By: Oneil Devonshire M.D.   On: 04/05/2023 23:28   DG Chest 2 View Result Date: 04/05/2023 CLINICAL DATA:  Fever and diarrhea EXAM: CHEST - 2 VIEW COMPARISON:  02/06/2023 FINDINGS: Stable cardiomediastinal silhouette. Aortic atherosclerotic calcification. Bibasilar atelectasis. Otherwise no focal consolidation, pleural effusion, or pneumothorax. No displaced rib fractures. Similar superior endplate compression fracture of L1. IMPRESSION: No acute cardiopulmonary disease. Electronically Signed   By: Norman Gatlin M.D.   On: 04/05/2023 20:43      Subjective: Patient seen and examined at bedside today.  Hemodynamically stable.  Comfortable this morning.  Denies any abdominal pain, nausea vomiting or diarrhea.  Eager to go home.  Case discussed with GI and current plan is for discharge home.  Discharge Exam: Vitals:   04/08/23 1300 04/08/23 1323  BP: (!) 150/64 (!) 154/61  Pulse: 75 78  Resp: (!) 24   Temp: (!) 97.5 F (36.4 C)   SpO2: 92% 98%   Vitals:   04/08/23 1250 04/08/23 1257 04/08/23 1300 04/08/23 1323  BP: (!) 140/72  (!) 150/64 (!) 154/61  Pulse: 83 75 75 78  Resp: (!) 27 20 (!) 24   Temp: 97.6 F (36.4 C)  (!) 97.5 F (36.4 C)   TempSrc:      SpO2: 100% 92% 92% 98%  Weight:      Height:         General: Pt is alert, awake, not in acute distress,obese Cardiovascular: RRR, S1/S2 +, no rubs, no gallops Respiratory: CTA bilaterally, no wheezing, no rhonchi Abdominal: Soft, NT, ND, bowel sounds + Extremities: no edema, no cyanosis    The results of significant diagnostics from this hospitalization (including imaging, microbiology, ancillary and laboratory) are listed below for reference.     Microbiology: Recent Results (from the past 240 hours)  Blood culture (routine x 2)     Status: None (Preliminary result)   Collection Time: 04/05/23  9:12 PM   Specimen: Left Antecubital; Blood  Result Value Ref Range Status   Specimen Description LEFT ANTECUBITAL  Final   Special Requests BOTTLES DRAWN AEROBIC AND ANAEROBIC  Final   Culture   Final    NO GROWTH 3 DAYS Performed at [facility], [address], [place], [place] [address]    Report Status PENDING  Incomplete  Blood culture (routine x 2)  Status: None (Preliminary result)   Collection Time: 04/05/23  9:24 PM   Specimen: BLOOD RIGHT HAND  Result Value Ref Range Status   Specimen Description BLOOD RIGHT HAND  Final   Special Requests BOTTLES DRAWN AEROBIC AND ANAEROBIC  Final   Culture   Final    NO GROWTH 3 DAYS Performed at [facility], [address], [place], [place] [address]    Report Status PENDING  Incomplete  Resp panel by RT-PCR (RSV, Flu A&B, Covid) Anterior Nasal Swab     Status: None   Collection Time: 04/05/23  9:45 PM   Specimen: Anterior Nasal Swab  Result Value Ref Range Status   SARS Coronavirus 2 by RT PCR NEGATIVE NEGATIVE Final    Comment: (NOTE) SARS-CoV-2 target nucleic acids are NOT DETECTED.  The SARS-CoV-2 RNA is generally detectable in upper respiratory specimens during the acute phase of infection. The lowest concentration of SARS-CoV-2 viral copies this assay can detect is 138 copies/mL. A negative result does not preclude SARS-Cov-2 infection and should not be used as the sole  basis for treatment or other patient management decisions. A negative result may occur with  improper specimen collection/handling, submission of specimen other than nasopharyngeal swab, presence of viral mutation(s) within the areas targeted by this assay, and inadequate number of viral copies(<138 copies/mL). A negative result must be combined with clinical observations, patient history, and epidemiological information. The expected result is Negative.  Fact Sheet for Patients:  bloggercourse.com  Fact Sheet for Healthcare Providers:  seriousbroker.it  This test is no t yet approved or cleared by the [place]  FDA and  has been authorized for detection and/or diagnosis of SARS-CoV-2 by FDA under an Emergency Use Authorization (EUA). This EUA will remain  in effect (meaning this test can be used) for the duration of the COVID-19 declaration under Section 564(b)(1) of the Act, 21 U.S.C.section 360bbb-3(b)(1), unless the authorization is terminated  or revoked sooner.       Influenza A by PCR NEGATIVE NEGATIVE Final   Influenza B by PCR NEGATIVE NEGATIVE Final    Comment: (NOTE) The Xpert Xpress SARS-CoV-2/FLU/RSV plus assay is intended as an aid in the diagnosis of influenza from Nasopharyngeal swab specimens and should not be used as a sole basis for treatment. Nasal washings and aspirates are unacceptable for Xpert Xpress SARS-CoV-2/FLU/RSV testing.  Fact Sheet for Patients: bloggercourse.com  Fact Sheet for Healthcare Providers: seriousbroker.it  This test is not yet approved or cleared by the [place]  FDA and has been authorized for detection and/or diagnosis of SARS-CoV-2 by FDA under an Emergency Use Authorization (EUA). This EUA will remain in effect (meaning this test can be used) for the duration of the COVID-19 declaration under Section 564(b)(1) of the Act,  21 U.S.C. section 360bbb-3(b)(1), unless the authorization is terminated or revoked.     Resp Syncytial Virus by PCR NEGATIVE NEGATIVE Final    Comment: (NOTE) Fact Sheet for Patients: bloggercourse.com  Fact Sheet for Healthcare Providers: seriousbroker.it  This test is not yet approved or cleared by the [place]  FDA and has been authorized for detection and/or diagnosis of SARS-CoV-2 by FDA under an Emergency Use Authorization (EUA). This EUA will remain in effect (meaning this test can be used) for the duration of the COVID-19 declaration under Section 564(b)(1) of the Act, 21 U.S.C. section 360bbb-3(b)(1), unless the authorization is terminated or revoked.  Performed at [facility], [address], [place], [place] [address]   C Difficile Quick Screen  w PCR reflex     Status: None   Collection Time: 04/06/23 11:12 AM   Specimen: STOOL  Result Value Ref Range Status   C Diff antigen NEGATIVE NEGATIVE Final   C Diff toxin NEGATIVE NEGATIVE Final   C Diff interpretation No C. difficile detected.  Final    Comment: Performed at [facility], [address] [place], [place] [address]  Gastrointestinal Panel by PCR , Stool     Status: Abnormal   Collection Time: 04/06/23 11:12 AM   Specimen: STOOL  Result Value Ref Range Status   Campylobacter species DETECTED (A) NOT DETECTED Final    Comment: RESULT CALLED TO, READ BACK BY AND VERIFIED WITH: WHITNEY EARLY 04/08/23 1012 MW    Plesimonas shigelloides NOT DETECTED NOT DETECTED Final   Salmonella species NOT DETECTED NOT DETECTED Final   Yersinia enterocolitica NOT DETECTED NOT DETECTED Final   Vibrio species NOT DETECTED NOT DETECTED Final   Vibrio cholerae NOT DETECTED NOT DETECTED Final   Enteroaggregative E coli (EAEC) NOT DETECTED NOT DETECTED Final   Enteropathogenic E coli (EPEC) NOT DETECTED NOT DETECTED Final   Enterotoxigenic E coli (ETEC) NOT DETECTED NOT  DETECTED Final   Shiga like toxin producing E coli (STEC) NOT DETECTED NOT DETECTED Final   Shigella/Enteroinvasive E coli (EIEC) NOT DETECTED NOT DETECTED Final   Cryptosporidium NOT DETECTED NOT DETECTED Final   Cyclospora cayetanensis NOT DETECTED NOT DETECTED Final   Entamoeba histolytica NOT DETECTED NOT DETECTED Final   Giardia lamblia NOT DETECTED NOT DETECTED Final   Adenovirus F40/41 NOT DETECTED NOT DETECTED Final   Astrovirus NOT DETECTED NOT DETECTED Final   Norovirus GI/GII NOT DETECTED NOT DETECTED Final   Rotavirus A NOT DETECTED NOT DETECTED Final   Sapovirus (I, II, IV, and V) NOT DETECTED NOT DETECTED Final    Comment: Performed at [facility], [address] [place], [place] [address]     Labs: BNP (last 3 results) Recent Labs    02/06/23 1036  BNP 211.0*   Basic Metabolic Panel: Recent Labs  Lab 04/05/23 2112 04/06/23 0529 04/07/23 0355  NA 135 133* 134*  K 3.9 4.0 3.7  CL 102 104 105  CO2 23 23 22   GLUCOSE 172* 171* 149*  BUN 14 15 15   CREATININE 0.74 0.88 0.91  CALCIUM 9.1 8.1* 8.4*  MG  --  1.4*  --   PHOS  --  3.6  --    Liver Function Tests: Recent Labs  Lab 04/05/23 2112  AST 33  ALT 19  ALKPHOS 81  BILITOT 0.9  PROT 7.1  ALBUMIN 3.5   Recent Labs  Lab 04/05/23 2112  LIPASE 32   No results for input(s): AMMONIA in the last 168 hours. CBC: Recent Labs  Lab 04/05/23 2113 04/06/23 0529 04/06/23 1155 04/06/23 1854 04/07/23 0355 04/07/23 1833 04/08/23 0500  WBC 16.4* 17.2* 15.0*  --  11.1*  --   --   NEUTROABS 14.8*  --   --   --  7.8*  --   --   HGB 7.1* 6.3* 6.2* 6.9* 7.1* 7.3* 7.4*  HCT 26.3* 23.0* 23.6* 25.4* 25.8* 26.8* 27.0*  MCV 65.9* 66.1* 66.7*  --  70.3*  --   --   PLT 167 153 149*  --  152  --   --    Cardiac Enzymes: No results for input(s): CKTOTAL, CKMB, CKMBINDEX, TROPONINI in the last 168 hours. BNP: Invalid input(s): POCBNP CBG: Recent Labs  Lab  04/07/23 1058 04/07/23 1607  04/07/23 2003 04/08/23 0741 04/08/23 1053  GLUCAP 271* 167* 235* 122* 119*   D-Dimer No results for input(s): DDIMER in the last 72 hours. Hgb A1c No results for input(s): HGBA1C in the last 72 hours. Lipid Profile No results for input(s): CHOL, HDL, LDLCALC, TRIG, CHOLHDL, LDLDIRECT in the last 72 hours. Thyroid  function studies No results for input(s): TSH, T4TOTAL, T3FREE, THYROIDAB in the last 72 hours.  Invalid input(s): FREET3 Anemia work up Recent Labs    04/07/23 0355 04/07/23 0934  FERRITIN  --  10*  TIBC 464*  --   IRON  17*  --    Urinalysis    Component Value Date/Time   COLORURINE YELLOW 04/05/2023 2115   APPEARANCEUR HAZY (A) 04/05/2023 2115   LABSPEC 1.017 04/05/2023 2115   PHURINE 6.0 04/05/2023 2115   GLUCOSEU NEGATIVE 04/05/2023 2115   HGBUR NEGATIVE 04/05/2023 2115   BILIRUBINUR NEGATIVE 04/05/2023 2115   KETONESUR NEGATIVE 04/05/2023 2115   PROTEINUR NEGATIVE 04/05/2023 2115   NITRITE NEGATIVE 04/05/2023 2115   LEUKOCYTESUR NEGATIVE 04/05/2023 2115   Sepsis Labs Recent Labs  Lab 04/05/23 2113 04/06/23 0529 04/06/23 1155 04/07/23 0355  WBC 16.4* 17.2* 15.0* 11.1*   Microbiology Recent Results (from the past 240 hours)  Blood culture (routine x 2)     Status: None (Preliminary result)   Collection Time: 04/05/23  9:12 PM   Specimen: Left Antecubital; Blood  Result Value Ref Range Status   Specimen Description LEFT ANTECUBITAL  Final   Special Requests BOTTLES DRAWN AEROBIC AND ANAEROBIC  Final   Culture   Final    NO GROWTH 3 DAYS Performed at [facility], [address] [place], [place] [address]    Report Status PENDING  Incomplete  Blood culture (routine x 2)     Status: None (Preliminary result)   Collection Time: 04/05/23  9:24 PM   Specimen: BLOOD RIGHT HAND  Result Value Ref Range Status   Specimen Description BLOOD RIGHT HAND  Final   Special Requests BOTTLES DRAWN AEROBIC AND ANAEROBIC  Final    Culture   Final    NO GROWTH 3 DAYS Performed at [facility], [address] [place], [place] [address]    Report Status PENDING  Incomplete  Resp panel by RT-PCR (RSV, Flu A&B, Covid) Anterior Nasal Swab     Status: None   Collection Time: 04/05/23  9:45 PM   Specimen: Anterior Nasal Swab  Result Value Ref Range Status   SARS Coronavirus 2 by RT PCR NEGATIVE NEGATIVE Final    Comment: (NOTE) SARS-CoV-2 target nucleic acids are NOT DETECTED.  The SARS-CoV-2 RNA is generally detectable in upper respiratory specimens during the acute phase of infection. The lowest concentration of SARS-CoV-2 viral copies this assay can detect is 138 copies/mL. A negative result does not preclude SARS-Cov-2 infection and should not be used as the sole basis for treatment or other patient management decisions. A negative result may occur with  improper specimen collection/handling, submission of specimen other than nasopharyngeal swab, presence of viral mutation(s) within the areas targeted by this assay, and inadequate number of viral copies(<138 copies/mL). A negative result must be combined with clinical observations, patient history, and epidemiological information. The expected result is Negative.  Fact Sheet for Patients:  bloggercourse.com  Fact Sheet for Healthcare Providers:  seriousbroker.it  This test is no t yet approved or cleared by the [place]  FDA and  has been authorized for detection and/or diagnosis of  SARS-CoV-2 by FDA under an Emergency Use Authorization (EUA). This EUA will remain  in effect (meaning this test can be used) for the duration of the COVID-19 declaration under Section 564(b)(1) of the Act, 21 U.S.C.section 360bbb-3(b)(1), unless the authorization is terminated  or revoked sooner.       Influenza A by PCR NEGATIVE NEGATIVE Final   Influenza B by PCR NEGATIVE NEGATIVE Final    Comment: (NOTE) The Xpert  Xpress SARS-CoV-2/FLU/RSV plus assay is intended as an aid in the diagnosis of influenza from Nasopharyngeal swab specimens and should not be used as a sole basis for treatment. Nasal washings and aspirates are unacceptable for Xpert Xpress SARS-CoV-2/FLU/RSV testing.  Fact Sheet for Patients: bloggercourse.com  Fact Sheet for Healthcare Providers: seriousbroker.it  This test is not yet approved or cleared by the [place]  FDA and has been authorized for detection and/or diagnosis of SARS-CoV-2 by FDA under an Emergency Use Authorization (EUA). This EUA will remain in effect (meaning this test can be used) for the duration of the COVID-19 declaration under Section 564(b)(1) of the Act, 21 U.S.C. section 360bbb-3(b)(1), unless the authorization is terminated or revoked.     Resp Syncytial Virus by PCR NEGATIVE NEGATIVE Final    Comment: (NOTE) Fact Sheet for Patients: bloggercourse.com  Fact Sheet for Healthcare Providers: seriousbroker.it  This test is not yet approved or cleared by the [place]  FDA and has been authorized for detection and/or diagnosis of SARS-CoV-2 by FDA under an Emergency Use Authorization (EUA). This EUA will remain in effect (meaning this test can be used) for the duration of the COVID-19 declaration under Section 564(b)(1) of the Act, 21 U.S.C. section 360bbb-3(b)(1), unless the authorization is terminated or revoked.  Performed at [facility], [address] [place], [place] [address]   C Difficile Quick Screen w PCR reflex     Status: None   Collection Time: 04/06/23 11:12 AM   Specimen: STOOL  Result Value Ref Range Status   C Diff antigen NEGATIVE NEGATIVE Final   C Diff toxin NEGATIVE NEGATIVE Final   C Diff interpretation No C. difficile detected.  Final    Comment: Performed at [facility], [address] [place], [place] [address]   Gastrointestinal Panel by PCR , Stool     Status: Abnormal   Collection Time: 04/06/23 11:12 AM   Specimen: STOOL  Result Value Ref Range Status   Campylobacter species DETECTED (A) NOT DETECTED Final    Comment: RESULT CALLED TO, READ BACK BY AND VERIFIED WITH: WHITNEY EARLY 04/08/23 1012 MW    Plesimonas shigelloides NOT DETECTED NOT DETECTED Final   Salmonella species NOT DETECTED NOT DETECTED Final   Yersinia enterocolitica NOT DETECTED NOT DETECTED Final   Vibrio species NOT DETECTED NOT DETECTED Final   Vibrio cholerae NOT DETECTED NOT DETECTED Final   Enteroaggregative E coli (EAEC) NOT DETECTED NOT DETECTED Final   Enteropathogenic E coli (EPEC) NOT DETECTED NOT DETECTED Final   Enterotoxigenic E coli (ETEC) NOT DETECTED NOT DETECTED Final   Shiga like toxin producing E coli (STEC) NOT DETECTED NOT DETECTED Final   Shigella/Enteroinvasive E coli (EIEC) NOT DETECTED NOT DETECTED Final   Cryptosporidium NOT DETECTED NOT DETECTED Final   Cyclospora cayetanensis NOT DETECTED NOT DETECTED Final   Entamoeba histolytica NOT DETECTED NOT DETECTED Final   Giardia lamblia NOT DETECTED NOT DETECTED Final   Adenovirus F40/41 NOT DETECTED NOT DETECTED Final   Astrovirus NOT DETECTED NOT DETECTED Final   Norovirus GI/GII NOT  DETECTED NOT DETECTED Final   Rotavirus A NOT DETECTED NOT DETECTED Final   Sapovirus (I, II, IV, and V) NOT DETECTED NOT DETECTED Final    Comment: Performed at [facility], [address] [place], [place] [address]    Please note: You were cared for by a hospitalist during your hospital stay. Once you are discharged, your primary care physician will handle any further medical issues. Please note that NO REFILLS for any discharge medications will be authorized once you are discharged, as it is imperative that you return to your primary care physician (or establish a relationship with a primary care physician if you do not have one) for your post hospital  discharge needs so that they can reassess your need for medications and monitor your lab values.    Time coordinating discharge: 40 minutes  SIGNED:   Ivonne Mustache, MD  [facility] 04/08/2023, 1:27 PM Pager [phone]  If 7PM-7AM, please contact night-coverage [email or web address] Password [email or web address]

## 2023-04-08 NOTE — Telephone Encounter (Addendum)
 Per Dr. Eartha Neptune, please arrange follow up in 2-3 weeks  Andre Gallego, she EGD in 4 weeks with Dr. Shaaron - has one in 1/29, but this needs to be moved  Message sent to endo to cancel upcoming procedure 1/29. Neptune do you want us  to go ahead and reschedule now or wait until she comes in and see's you?    Mandy, please schedule HFU

## 2023-04-09 ENCOUNTER — Encounter: Payer: Self-pay | Admitting: *Deleted

## 2023-04-09 ENCOUNTER — Other Ambulatory Visit: Payer: Self-pay | Admitting: *Deleted

## 2023-04-09 ENCOUNTER — Inpatient Hospital Stay: Payer: Medicare Other | Admitting: Physician Assistant

## 2023-04-09 ENCOUNTER — Telehealth: Payer: Self-pay

## 2023-04-09 ENCOUNTER — Other Ambulatory Visit: Payer: Self-pay | Admitting: Physician Assistant

## 2023-04-09 NOTE — Transitions of Care (Post Inpatient/ED Visit) (Signed)
   04/09/2023  Name: Wendy Kramer MRN: [id number] DOB: 1960-10-30  Today's TOC FU Call Status: Today's TOC FU Call Status:: Unsuccessful Call (1st Attempt) Unsuccessful Call (1st Attempt) Date: 04/09/23  Attempted to reach the patient regarding the most recent Inpatient/ED visit.  Follow Up Plan: Additional outreach attempts will be made to reach the patient to complete the Transitions of Care (Post Inpatient/ED visit) call.   Bari Mayans , BSN, RN Care Management Coordinator    [facility] [name] Direct Dial: [phone]

## 2023-04-09 NOTE — Progress Notes (Signed)
 Appointment originally scheduled for 04/09/2023 was delayed due to hospitalization.  She received VENOFER  200 mg on 04/07/2023 during hospitalization. Labs from 04/07/2023 show iron  deficiency with ferritin 10, iron  saturation 4%, TIBC 464. Hgb 7.4 on 04/08/2023.  Patient appointment rescheduled for 04/15/2023. We will recheck CBC/BB sample same day as appointment. We will schedule for IV Monoferric  same day as appointment.    Pleasant CHRISTELLA Barefoot, PA-C 04/09/23 9:29 AM

## 2023-04-09 NOTE — Progress Notes (Signed)
 Wendy Kramer was contacted by telephone to verify understanding of discharge instructions status post their most recent discharge from the hospital on the date:  04/08/2023.  Inpatient discharge AVS was re-reviewed with patient, along with cancer center appointments.  Verification of understanding for oncology specific follow-up was validated using the Teach Back method.    Transportation to appointments were confirmed for the patient as being self/caregiver.  Wendy Kramer's questions were addressed to their satisfaction upon completion of this post discharge follow-up call for outpatient oncology.

## 2023-04-10 ENCOUNTER — Telehealth: Payer: Self-pay

## 2023-04-10 LAB — CULTURE, BLOOD (ROUTINE X 2)
Culture: NO GROWTH
Culture: NO GROWTH

## 2023-04-10 NOTE — Transitions of Care (Post Inpatient/ED Visit) (Signed)
 04/10/2023  Name: Wendy Kramer MRN: [id number] DOB: [birth year]  Today's TOC FU Call Status: Today's TOC FU Call Status:: Successful TOC FU Call Completed TOC FU Call Complete Date: 04/10/23 Patient's Name and Date of Birth confirmed.  Transition Care Management Follow-up Telephone Call Date of Discharge: 04/08/23 [facility] Type of Discharge: Inpatient Admission Primary Inpatient Discharge Diagnosis:: Sepsis secondary to colitis How have you been since you were released from the hospital?: Better (Today a littel shaky. Was out of stomach medicine yesterday. Got it straightened out this am.) Any questions or concerns?: No  Items Reviewed: Did you receive and understand the discharge instructions provided?: Yes Medications obtained,verified, and reconciled?: Yes (Medications Reviewed) Any new allergies since your discharge?: No Dietary orders reviewed?: Yes Do you have support at home?: Yes Name of Support/Comfort Primary Source: Wendy Kramer (Daughter)  [phone] (Home Phone)  Medications Reviewed Today: Medications Reviewed Today     Reviewed by Carolee Heron NOVAK, RN (Case Manager) on 04/10/23 at 1036  Med List Status: <None>   Medication Order Taking? Sig Documenting Provider Last Dose Status Informant  Accu-Chek Softclix Lancets lancets [id number] Yes Use as instructed to monitor glucose 1 times daily. Therisa Benton PARAS, NP Taking Active Self, Pharmacy Records  acetaminophen  (TYLENOL ) 500 MG tablet [id number] Yes Take 1,000 mg by mouth every 6 (six) hours as needed for moderate pain (pain score 4-6). [provider] Taking Active Self, Pharmacy Records  albuterol  (PROVENTIL  HFA;VENTOLIN  HFA) 108 (90 Base) MCG/ACT inhaler [id number] Yes Inhale 1 puff into the lungs every 6 (six) hours as needed for wheezing or shortness of breath. [provider] Taking Active Self, Pharmacy Records  Blood Glucose Monitoring Suppl (ACCU-CHEK GUIDE ME)  w/Device KIT [id number] Yes 1 Piece by Does not apply route as directed. Nida, Gebreselassie W, MD Taking Active Self, Pharmacy Records  BREO ELLIPTA  200-25 MCG/ACT AEPB [id number] Yes Inhale 1 puff into the lungs daily in the afternoon. [provider] Taking Active Self, Pharmacy Records  ferrous sulfate  325 (65 FE) MG tablet [id number] No Take 1 tablet (325 mg total) by mouth daily with breakfast.  Patient not taking: Reported on 04/10/2023   Jillian Buttery, MD Not Taking Active            Med Note SYDELL, HERON NOVAK Schaumann Apr 10, 2023 10:32 AM) [place] provider and hematology provider said not to take and will getting Iron  infusion 04/15/23.   fluticasone  (FLONASE ) 50 MCG/ACT nasal spray [id number] Yes Place 2 sprays into both nostrils daily as needed for allergies or rhinitis. [provider] Taking Active Self, Pharmacy Records  glucose blood (ACCU-CHEK GUIDE) test strip [id number] Yes USE TO CHECK BLOOD SUGARS ONCE DAILY Therisa Benton PARAS, NP Taking Active Self, Pharmacy Records  lisinopril (ZESTRIL) 20 MG tablet [id number] Yes Take 10 mg by mouth every evening. [provider] Taking Active Self, Pharmacy Records           Med Note SYDELL, HERON NOVAK Schaumann Apr 10, 2023 10:33 AM) See below, patient was splitting in two as BP was running low. Will speak to provider at next visit. Monitors BP at home.   metFORMIN  (GLUCOPHAGE ) 500 MG tablet [id number] Yes Take 1 tablet (500 mg total) by mouth 2 (two) times daily with a meal. Therisa Benton PARAS, NP Taking Active Self, Pharmacy Records  Multiple Vitamins-Minerals (ONE DAILY MULTIVIT/IRON -FREE) TABS [id number] Yes Take 1 tablet by mouth every evening. [provider] Taking Active Self, Pharmacy Records  pantoprazole  (PROTONIX ) 40 MG tablet [id number] Yes Take 1 tablet (40 mg total) by mouth 2 (two) times daily. Jillian Buttery, MD Taking Active            Med Note SYDELL, HERON KATHEE Schaumann Apr 10, 2023 10:35 AM) Will start today,  04/10/23, as there was an issue with refills, but patient called provider and got it worked out and will pick up today.   traMADol  (ULTRAM ) 50 MG tablet [id number] Yes Take 100 mg by mouth every 8 (eight) hours as needed for moderate pain (pain score 4-6). [provider] Taking Active Self, Pharmacy Records            Home Care and Equipment/Supplies: Were Home Health Services Ordered?: No Any new equipment or medical supplies ordered?: No  Functional Questionnaire: Do you need assistance with bathing/showering or dressing?: No Do you need assistance with meal preparation?: No Do you need assistance with eating?: No Do you have difficulty maintaining continence: No Do you need assistance with getting out of bed/getting out of a chair/moving?: No Do you have difficulty managing or taking your medications?: No  Follow up appointments reviewed: PCP Follow-up appointment confirmed?: No (Patient will call and schedule appointment with Dr Carlette.) MD Provider Line [phone] Given: No Specialist Hospital Follow-up appointment confirmed?: Yes Date of Specialist follow-up appointment?: 04/15/23 Follow-Up Specialty Provider:: Dr Lamon Do you need transportation to your follow-up appointment?: No Do you understand care options if your condition(s) worsen?: Yes-patient verbalized understanding  SDOH Interventions Today    Flowsheet Row Most Recent Value  SDOH Interventions   Food Insecurity Interventions Intervention Not Indicated  Housing Interventions Intervention Not Indicated, Other (Comment)  Transportation Interventions Intervention Not Indicated  Utilities Interventions Other (Comment)  [Patient has already had a CSW referral to have the heat looked at but has not yet been able to get in touch and then was hospitalized.]  Health Literacy Interventions Intervention Not Indicated      Interventions Today    Flowsheet Row Most Recent Value  Chronic Disease    Chronic disease during today's visit Diabetes, Hypertension (HTN), Other  [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 Weight loss due to gastro issues has helped blood sugars.]  General Interventions   General Interventions Discussed/Reviewed Vaccines  Vaccines --  [Going to speak to hematologist about what vaccines they recommend at this time.]  Doctor Visits Discussed/Reviewed Doctor Visits Discussed, Doctor Visits Reviewed, PCP, Specialist  Health Screening Bone Density, Mammogram, Colonoscopy  [Bone Density was 18 months ago, Mammogram Fall 2024.]  PCP/Specialist Visits Compliance with follow-up visit  Exercise Interventions   Exercise Discussed/Reviewed Physical Activity, Weight Managment  Physical Activity Discussed/Reviewed Physical Activity Discussed  Weight Management Weight loss  [Diet changes due to other issues and some loss of appetite.]  Mental Health Interventions   Mental Health Discussed/Reviewed --  [No issues voiced]  Nutrition Interventions   Nutrition Discussed/Reviewed Decreasing sugar intake, Fluid intake, Increasing proteins  Pharmacy Interventions   Pharmacy Dicussed/Reviewed Pharmacy Topics Discussed, Medications and their functions, Medication Adherence, Affording Medications  [No issues till changed Symbicort  and Switched to Brio which does not work as well--insurance stopped covering.]  Medication Adherence Not taking medication  Safety Interventions   Safety Discussed/Reviewed Safety Discussed, Home Safety  Home Safety Refer for community resources  [Was referred to CSW ambulatory referral in 12/24 for heating issues.]  Advanced Directive Interventions   Advanced Directives Discussed/Reviewed --  [Patient's daughter knows her wishes and declined resources at this time.]  TOC Interventions Today    Flowsheet Row Most Recent Value  TOC Interventions   TOC Interventions Discussed/Reviewed TOC Interventions Discussed, TOC Interventions Reviewed, Post discharge  activity limitations per provider, S/S of infection  [Reviewed appointments and will call to see PCP asap. Was called by [facility] yesterday. 04/09/23.]     04/10/23: TOC RN CM completed successful telephonic post discharge from the hospital outreach/follow up call. Explained purpose and importance of outreach call. Patient was appreciative of the call and is well engaged in her self care management skills and understanding of her conditions, medications, allergies, and disease management.   The [facility] program was explained after the call but patient declined enrollment as she stated she is followed closely by the [facility] RN CM.   A TOC CSW referral was made after last discharge and is scheduled to follow up by phone call on 04/17/23 regarding some HVAC/heating issues. Patient does have heat via space heater. Lives with daughter and her three children. The daughter is supportive and engaged in assisting patient, she states.   Per the encounters she has been outreached by [facility] CCM Care Coordination RN CM last on 03/2023, and by CSW on 04/02/22, again with a follow up scheduled for 04/17/23. Patient was okay with CSW follow up but was unsure she needed continued CCM follow up with CM from [facility], but will route notes to them.   PCP follow up not yet scheduled, the importance of this was discussed, and patient will call today to schedule a post discharge follow up appointment. Patient also received a post discharge follow up call yesterday, 04/09/23 from [facility] RN CM, and a 04/15/23 appointment with Hematology, Dr Lamon, for IRON  infusion and f/u appointment. Was recently seen in ED on 02/06/23.   Bing Edison MSN, RN Care Coordinator [place]  [facility]; [facility] Office Hours Wed/Thur  8:00 am-6:00 pm Direct Dial: [phone] Main Phone [phone]  Fax: [phone] [email or web address]

## 2023-04-10 NOTE — Anesthesia Postprocedure Evaluation (Signed)
 Anesthesia Post Note  Patient: Wendy Kramer  Procedure(s) Performed: ESOPHAGOGASTRODUODENOSCOPY (EGD) WITH PROPOFOL   Patient location during evaluation: Phase II Anesthesia Type: General Level of consciousness: awake Pain management: pain level controlled Vital Signs Assessment: post-procedure vital signs reviewed and stable Respiratory status: spontaneous breathing and respiratory function stable Cardiovascular status: blood pressure returned to baseline and stable Postop Assessment: no headache and no apparent nausea or vomiting Anesthetic complications: no Comments: Late entry   No notable events documented.   Last Vitals:  Vitals:   04/08/23 1300 04/08/23 1323  BP: (!) 150/64 (!) 154/61  Pulse: 75 78  Resp: (!) 24   Temp: (!) 36.4 C   SpO2: 92% 98%    Last Pain:  Vitals:   04/08/23 1300  TempSrc:   PainSc: 0-No pain                 [name]

## 2023-04-11 ENCOUNTER — Encounter (HOSPITAL_COMMUNITY): Payer: Self-pay | Admitting: Gastroenterology

## 2023-04-11 ENCOUNTER — Ambulatory Visit: Payer: Self-pay | Admitting: *Deleted

## 2023-04-11 NOTE — Patient Outreach (Signed)
 Health Equity Plan Care Coordination   Follow Up Visit Note   04/11/2023 Name: Wendy Kramer MRN: [id number] DOB: 07-04-1960  Wendy Kramer is a 63 y.o. year old female who sees Wendy, Benita Area, MD for primary care. I spoke with  Wendy Kramer by phone today.  What matters to the patients health and wellness today?  Managing anemia, GI bleed, and blood sugar```    Goals Addressed             This Visit's Progress    Care Coordination Needs   On track    Care Management Goals: Patient will reach out to the following specialists to schedule an appointment at her convenience. Schedule diabetic eye exam Dermatologist. Wendy Kramer office in [place] for skin check [phone] Orthopedist. Wendy Kramer in [place] for neck/back pain with abnormal imaging [phone] Pulmonologist. May need a referral. [facility] in [place] [phone] Patient will talk with Wendy Kramer Guides Re: lack of heat Using space heaters on coldest days/nights because her heating system is broken Patient will reach out to RN Care Manager at [phone] if she needs assistance with scheduling or care coordination.      Manage Anemia   On track    Care Coordination Goals: Patient will monitor for any symptoms of anemia or bleeding and will reach out to provider if any symptoms noted Patient will have repeat lab work done (current orders from Wendy Carlette) Patient will keep appointment for endoscopy with Wendy Shaaron on 04/30/23 Patient will eat iron  rich foods with vitamin C to help with absorption Patient will reach out to RN Care Management Coordinator [phone] with any resource or care coordination needs     Over the next 6 months patient will decrease A1C to < 7 (Health Equity Plan)   On track    Care Coordination Goals: Patient will follow-up with endocrinologist every 3 months or as recommended Endocrinology f/u scheduled for 04/08/23 Patient will continue to monitor and record  blood sugar daily and as needed with glucometer, and will call endocrinologist with any readings less than 70 or for 3 readings in a row over 200 Patient will take blood sugar log and meter to provider visits for review Patient will follow a carb modified diet with 3 meals per day that include 30GM of carbohydrates, lean proteins, and healthy fats and less than 2 snacks per day with less than 15GM of carbohydrates  Patient will increase physical activity as tolerated (limited by joint pain) with an ultimate goal of at least 150 minutes of moderate activity per week Patient will continue to monitor and record blood pressure twice per day, since HTN is a comorbidity of DM, and reach out to PCP with any readings outside of recommended range Patient will reach out to [facility] Coordinator [phone] with any care coordination or resource needs        SDOH assessments and interventions completed:  Yes  SDOH Interventions Today    Flowsheet Row Most Recent Value  SDOH Interventions   Transportation Interventions Intervention Not Indicated  Social Connections Interventions Intervention Not Indicated  [Talks with friends/family daily. Daughter and 3 grandchildren live with her. She does not feel socially isolated.]        Care Coordination Interventions:  Yes, provided  Interventions Today    Flowsheet Row Most Recent Value  Chronic Disease   Chronic disease during today's visit Diabetes, Other  [NASH, Anemia, GI Bleed]  General Interventions   General Interventions Discussed/Reviewed General Interventions  Discussed, General Interventions Reviewed, Labs, Durable Medical Equipment (DME), [employer], Doctor Visits, Communication with  Labs Hgb A1c every 3 months, Kidney Function  [CBC]  Doctor Visits Discussed/Reviewed Doctor Visits Discussed, Doctor Visits Reviewed, Specialist, PCP  [facility] 04/05/23-04/08/23 for sepsis, anemia]  Durable Medical Equipment (DME) Glucomoter, Walker   PCP/Specialist Visits Compliance with follow-up visit  [F/U with endocrinology. Gastroenterology 04/28/23, Hematology 05/26/22,]  Communication with PCP/Specialists  [Staff message to [facility] requesting that they outreach patient to reschdule follow-up appointment on 1/7 that was cancelled due to hospitalizationschedule]  Exercise Interventions   Exercise Discussed/Reviewed Physical Activity  Physical Activity Discussed/Reviewed Physical Activity Discussed, Physical Activity Reviewed  Weight Management Weight loss  [watching diet and slowly losing weight]  Education Interventions   Education Provided Provided Education  Provided Verbal Education On Nutrition, Labs, Blood Sugar Monitoring, Mental Health/Coping with Illness, Medication, When to see the doctor, Exercise, Community Resources  Labs Reviewed Hgb A1c  [Hemoglobin 7.4 at discharge on 04/08/23. Up from 6.2 on 04/06/23 after transfusion.]  Nutrition Interventions   Nutrition Discussed/Reviewed Nutrition Discussed, Nutrition Reviewed, Carbohydrate meal planning, Adding fruits and vegetables, Fluid intake, Portion sizes, Decreasing sugar intake  Pharmacy Interventions   Pharmacy Dicussed/Reviewed Pharmacy Topics Discussed, Pharmacy Topics Reviewed, Medications and their functions  Safety Interventions   Safety Discussed/Reviewed Safety Discussed, Safety Reviewed, Fall Risk, Home Safety  Home Safety Assistive Devices       Follow up plan: Follow up call scheduled for 04/18/23    Encounter Outcome:  Patient Visit Completed   Wendy Pellet, RN, BSN Care Manager [place]  [facility]  Population Health  Direct Dial: [phone] Main #: [phone]

## 2023-04-14 NOTE — Progress Notes (Signed)
 [facility] [address], [place] [address]   CLINIC:  Medical Oncology/Hematology  PCP:  Carlette Benita Area, MD [address] 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 female returns for follow-up of severe iron  deficiency anemia from blood loss.  She was last seen by Dr. Rogers on 01/30/2023.  At last visit, patient was recommended to receive IV Monoferric , but chose not to do so due to worries regarding possible side effects.  In the interim since last visit, she was hospitalized from 04/05/2023 through 04/08/2023 due to sepsis secondary to colitis as well as acute on chronic iron  deficiency anemia from blood loss, with Hgb down to 6.2 on 04/06/2023.  She received 1 unit PRBC during hospitalization, as well as IV Venofer  200 mg on 04/07/2023.  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  At today's visit, she reports feeling fair.  She had some coffee ground stool that resolved a few days after hospital discharge.  She reports some improved energy.  Previously noted aching in legs has resolved.  She has some dyspnea on exertion.  She denies any pica, headaches, lightheadedness, syncope, or chest pain.  She has 80% energy and 100% appetite. She endorses that she is maintaining a stable weight.  ASSESSMENT & PLAN:  1.  Severe iron  deficiency anemia from blood loss: - CBC on 12/26/2022 with Hb-6.5, MCV-68.  Ferritin was 3 and percent saturation 2. - She received half unit of PRBC (September 2024) and developed hives.  Transfusion was abandoned. - Successful PRBC transfusion on 04/06/2023 with premedication. - Colonoscopy (12/27/2022): Diverticulosis of the left colon, nonbleeding internal and external hemorrhoids. - EGD (12/27/2022): Portal hypertensive gastropathy, GAVE with bleeding.  Treated with APC.   Normal duodenal bulb and second part of duodenum. - Hospitalized from 04/05/2023 through 04/08/2023 due to sepsis secondary to colitis as well as acute on chronic iron  deficiency anemia from blood loss, with Hgb down to 6.2 on 04/06/2023.  She received 1 unit PRBC during hospitalization, as well as IV Venofer  200 mg on 04/07/2023.  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 - Hematology workup (01/02/2023): Notable for severe iron  deficiency.  Hemolytic workup was negative.  Copper  and MMA levels are normal.  SPEP, free light chains and immunofixation were normal. - Most recent labs (04/07/2023) show severe iron  deficiency with ferritin 10, iron  saturation 4%, TIBC 464 - CBC today (04/15/2023): Hgb 8.5/MCV 72.8 - PLAN: Recommend IV iron  with Monoferric  1 g, to be given today.  (We discussed risk of potential side effects and rare risk of allergic reaction, and patient is agreeable to proceed.) - We will premedicate with cetirizine , acetaminophen , and IV Pepcid  due to history of medication allergies - Since melena has resolved and Hgb is improving, we will hold off on weekly CBCs for the time being.  However, patient notified to alert us  if she has any worsening symptoms or any recurrence of melanotic stool. - Same-day labs (CBC/D, BB sample, ferritin, iron /TIBC) + OFFICE visit in 6 weeks  2.  Social/Family History: - Lives at home with daughter and 3 grandchildren. Retired with disability, used to be a energy manager. Tobacco use of 0.5 ppd since age 79.  - No family history of anemia. Maternal aunt had breast cancer. Maternal great-grandfather had colon cancer.  3.  Other PMH - NASH cirrhosis, GAVE with upper GI bleed, hypertension, type 2 diabetes mellitus  PLAN SUMMARY: >> IV Monoferric  x 1 (given in clinic TODAY) >> Same-day labs (CBC/D, BB sample, ferritin, iron /TIBC) + OFFICE visit in 6 weeks     REVIEW OF SYSTEMS:   Review of Systems   Constitutional:  Positive for fatigue. Negative for appetite change, chills, diaphoresis, fever and unexpected weight change.  HENT:   Negative for lump/mass and nosebleeds.   Eyes:  Negative for eye problems.  Respiratory:  Positive for shortness of breath. Negative for cough and hemoptysis.   Cardiovascular:  Negative for chest pain, leg swelling and palpitations.  Gastrointestinal:  Positive for diarrhea. Negative for abdominal pain, blood in stool, constipation, nausea and vomiting.  Genitourinary:  Negative for hematuria.   Musculoskeletal:  Positive for arthralgias.  Skin: Negative.   Neurological:  Negative for dizziness, headaches and light-headedness.  Hematological:  Does not bruise/bleed easily.     PHYSICAL EXAM:  ECOG PERFORMANCE STATUS: 1 - Symptomatic but completely ambulatory  Vitals:   04/15/23 1251  BP: (!) 155/72  Pulse: 80  Resp: 18  Temp: 98.2 F (36.8 C)  SpO2: 99%   Filed Weights   04/15/23 1251  Weight: 231 lb 7.7 oz (105 kg)   Physical Exam Constitutional:      Appearance: Normal appearance. She is obese.  Cardiovascular:     Heart sounds: Murmur (PFO, per patient) heard.  Pulmonary:     Breath sounds: Normal breath sounds.  Neurological:     General: No focal deficit present.     Mental Status: Mental status is at baseline.  Psychiatric:        Behavior: Behavior normal. Behavior is cooperative.     PAST MEDICAL/SURGICAL HISTORY:  Past Medical History:  Diagnosis Date   Arthritis    Cardiac murmur    Cervical spine disease    Cirrhosis (HCC)    likely secondary to NASH   Diabetes mellitus without complication (HCC)    diet controlled   Exogenous hyperlipidemia    GERD (gastroesophageal reflux disease)    History of kidney stones    HTN (hypertension)    Morbid obesity due to excess calories (HCC)    Other chronic pain    Panic disorder    Patent foramen ovale    TEE 2018   Prediabetes    Sleep apnea    Stroke (HCC)     mini-stroke no deficits from this.   Unspecified asthma with (acute) exacerbation    Vitamin D deficiency    Past Surgical History:  Procedure Laterality Date   BIOPSY  03/05/2018   Procedure: BIOPSY;  Surgeon: Shaaron Lamar HERO, MD;  [facility];  Service: Endoscopy;;  gastric   CHOLECYSTECTOMY     COLONOSCOPY WITH PROPOFOL  N/A 03/05/2018   Dr. Shaaron: Diverticulosis in the sigmoid and descending colon.  3 colon polyps removed, 2 hyperplastic and one sessile serrated polyp.  Next colonoscopy 3 years.   COLONOSCOPY WITH PROPOFOL  N/A 12/27/2022   Procedure: COLONOSCOPY WITH PROPOFOL ;  Surgeon: Cinderella Deatrice FALCON, MD;  [facility];  Service: Endoscopy;  Laterality: N/A;   ESOPHAGOGASTRODUODENOSCOPY (EGD) WITH PROPOFOL  N/A 03/05/2018   Dr. Shaaron: Erosive reflux esophagitis, small hiatal hernia, erosive gastropathy with reactive gastropathy on biopsy.  No H. pylori.   ESOPHAGOGASTRODUODENOSCOPY (EGD) WITH PROPOFOL  N/A 12/27/2022   Procedure: ESOPHAGOGASTRODUODENOSCOPY (EGD) WITH PROPOFOL ;  Surgeon: Cinderella Deatrice FALCON, MD;  [facility];  Service: Endoscopy;  Laterality: N/A;   ESOPHAGOGASTRODUODENOSCOPY (EGD) WITH PROPOFOL  N/A 04/08/2023   Procedure: ESOPHAGOGASTRODUODENOSCOPY (EGD) WITH PROPOFOL ;  Surgeon: Eartha Angelia Sieving, MD;  [facility];  Service: Gastroenterology;  Laterality: N/A;   HERNIA REPAIR     umbilical   HOT HEMOSTASIS  12/27/2022   Procedure: HOT HEMOSTASIS (ARGON PLASMA COAGULATION/BICAP);  Surgeon: Cinderella Deatrice FALCON, MD;  [facility];  Service: Endoscopy;;   POLYPECTOMY  03/05/2018   Procedure: POLYPECTOMY;  Surgeon: Shaaron Lamar HERO, MD;  [facility];  Service: Endoscopy;;  colon   SHOULDER ARTHROSCOPY Right    TEE WITHOUT CARDIOVERSION  2018   patent foramen ovale   TONSILLECTOMY AND ADENOIDECTOMY     TUBAL LIGATION      SOCIAL HISTORY:  Social History   Socioeconomic History   Marital status:  Legally Separated    Spouse name: Not on file   Number of children: Not on file   Years of education: Not on file   Highest education level: Not on file  Occupational History   Not on file  Tobacco Use   Smoking status: Every Day    Current packs/day: 0.50    Average packs/day: 0.5 packs/day for 40.0 years (20.0 ttl pk-yrs)    Types: Cigarettes   Smokeless tobacco: Never  Vaping Use   Vaping status: Never Used  Substance and Sexual Activity   Alcohol  use: No   Drug use: No   Sexual activity: Not Currently    Birth control/protection: Post-menopausal, Surgical    Comment: tubal  Other Topics Concern   Not on file  Social History Narrative   Not on file   Social Drivers of Health   Financial Resource Strain: Low Risk  (01/28/2023)   Overall Financial Resource Strain (CARDIA)    Difficulty of Paying Living Expenses: Not very hard  Food Insecurity: No Food Insecurity (04/10/2023)   Hunger Vital Sign    Worried About Running Out of Food in the Last Year: Never true    Ran Out of Food in the Last Year: Never true  Recent Concern: Food Insecurity - Food Insecurity Present (03/18/2023)   Hunger Vital Sign    Worried About Running Out of Food in the Last Year: Never true    Ran Out of Food in the Last Year: Sometimes true  Transportation Needs: No Transportation Needs (04/11/2023)   PRAPARE - Administrator, Civil Service (Medical): No    Lack of Transportation (Non-Medical): No  Physical Activity: Insufficiently Active (02/17/2023)   Exercise Vital Sign    Days of Exercise per Week: 3 days    Minutes of Exercise per Session: 30 min  Stress: No Stress Concern Present (06/04/2022)   [employer] of Occupational Health - Occupational Stress Questionnaire    Feeling of Stress : Only a little  Social Connections: Socially Isolated (04/11/2023)   Social Connection and Isolation Panel [NHANES]    Frequency of Communication with Friends and Family: More than three times  a week    Frequency of Social Gatherings with Friends and Family: More than three times a week    Attends Religious Services: Never    Database Administrator or Organizations: No    Attends Banker Meetings: Never    Marital Status: Separated  Intimate Partner Violence: Not At Risk (04/10/2023)   Humiliation, Afraid, Rape, and Kick questionnaire    Fear of Current or Ex-Partner: No  Emotionally Abused: No    Physically Abused: No    Sexually Abused: No    FAMILY HISTORY:  Family History  Problem Relation Age of Onset   Breast cancer Maternal Aunt    Colon cancer Other        maternal great grandfather   Liver disease Brother        Fatty liver   HIV Brother    Hypertension Mother    Hyperlipidemia Mother    Hypertension Father    Thyroid  disease Sister    Pancreatic cancer Sister    Healthy Daughter    Healthy Son    Healthy Son    Heart disease Son     CURRENT MEDICATIONS:  Outpatient Encounter Medications as of 04/15/2023  Medication Sig Note   Accu-Chek Softclix Lancets lancets Use as instructed to monitor glucose 1 times daily.    acetaminophen  (TYLENOL ) 500 MG tablet Take 1,000 mg by mouth every 6 (six) hours as needed for moderate pain (pain score 4-6).    albuterol  (PROVENTIL  HFA;VENTOLIN  HFA) 108 (90 Base) MCG/ACT inhaler Inhale 1 puff into the lungs every 6 (six) hours as needed for wheezing or shortness of breath.    Blood Glucose Monitoring Suppl (ACCU-CHEK GUIDE ME) w/Device KIT 1 Piece by Does not apply route as directed.    BREO ELLIPTA  200-25 MCG/ACT AEPB Inhale 1 puff into the lungs daily in the afternoon.    ferrous sulfate  325 (65 FE) MG tablet Take 1 tablet (325 mg total) by mouth daily with breakfast. 04/10/2023: Gastro provider and hematology provider said not to take and will getting Iron  infusion 04/15/23.    fluticasone  (FLONASE ) 50 MCG/ACT nasal spray Place 2 sprays into both nostrils daily as needed for allergies or rhinitis.    glucose  blood (ACCU-CHEK GUIDE) test strip USE TO CHECK BLOOD SUGARS ONCE DAILY    lisinopril (ZESTRIL) 20 MG tablet Take 10 mg by mouth every evening. 04/10/2023: See below, patient was splitting in two as BP was running low. Will speak to provider at next visit. Monitors BP at home.    metFORMIN  (GLUCOPHAGE ) 500 MG tablet Take 1 tablet (500 mg total) by mouth 2 (two) times daily with a meal.    Multiple Vitamins-Minerals (ONE DAILY MULTIVIT/IRON -FREE) TABS Take 1 tablet by mouth every evening.    pantoprazole  (PROTONIX ) 40 MG tablet Take 1 tablet (40 mg total) by mouth 2 (two) times daily. 04/10/2023: Will start today, 04/10/23, as there was an issue with refills, but patient called provider and got it worked out and will pick up today.    traMADol  (ULTRAM ) 50 MG tablet Take 100 mg by mouth every 8 (eight) hours as needed for moderate pain (pain score 4-6).    No facility-administered encounter medications on file as of 04/15/2023.    ALLERGIES:  Allergies  Allergen Reactions   Asa [Aspirin] Anaphylaxis    Tolerates other NSAIDS   Augmentin [Amoxicillin-Pot Clavulanate] Nausea And Vomiting and Other (See Comments)    Syncope   Levaquin [Levofloxacin In D5w] Itching and Other (See Comments)    body turned red   Lovenox [Enoxaparin Sodium] Other (See Comments)    physician instructed not to use ever again. white veins popped out    LABORATORY DATA:  I have reviewed the labs as listed.  CBC    Component Value Date/Time   WBC 8.3 04/15/2023 1156   RBC 4.27 04/15/2023 1156   HGB 8.5 (L) 04/15/2023 1156   HCT 31.1 (L)  04/15/2023 1156   PLT 223 04/15/2023 1156   MCV 72.8 (L) 04/15/2023 1156   MCH 19.9 (L) 04/15/2023 1156   MCHC 27.3 (L) 04/15/2023 1156   RDW 26.1 (H) 04/15/2023 1156   LYMPHSABS 2.1 04/15/2023 1156   MONOABS 0.5 04/15/2023 1156   EOSABS 0.4 04/15/2023 1156   BASOSABS 0.1 04/15/2023 1156      Latest Ref Rng & Units 04/07/2023    3:55 AM 04/06/2023    5:29 AM 04/05/2023    9:12  PM  CMP  Glucose 70 - 99 mg/dL 850  828  827   BUN 8 - 23 mg/dL 15  15  14    Creatinine 0.44 - 1.00 mg/dL 9.08  9.11  9.25   Sodium 135 - 145 mmol/L 134  133  135   Potassium 3.5 - 5.1 mmol/L 3.7  4.0  3.9   Chloride 98 - 111 mmol/L 105  104  102   CO2 22 - 32 mmol/L 22  23  23    Calcium 8.9 - 10.3 mg/dL 8.4  8.1  9.1   Total Protein 6.5 - 8.1 g/dL   7.1   Total Bilirubin 0.0 - 1.2 mg/dL   0.9   Alkaline Phos 38 - 126 U/L   81   AST 15 - 41 U/L   33   ALT 0 - 44 U/L   19     DIAGNOSTIC IMAGING:  I have independently reviewed the relevant imaging and discussed with the patient.   WRAP UP:  All questions were answered. The patient knows to call the clinic with any problems, questions or concerns.  Medical decision making: Moderate  Time spent on visit: I spent 20 minutes counseling the patient face to face. The total time spent in the appointment was 30 minutes and more than 50% was on counseling.  Pleasant Wendy Barefoot, PA-C  04/15/23 1:28 PM

## 2023-04-15 ENCOUNTER — Inpatient Hospital Stay: Payer: Medicare Other | Attending: Hematology | Admitting: Physician Assistant

## 2023-04-15 ENCOUNTER — Inpatient Hospital Stay: Payer: Medicare Other

## 2023-04-15 VITALS — BP 143/54 | HR 82 | Temp 97.1°F | Resp 18

## 2023-04-15 VITALS — BP 155/72 | HR 80 | Temp 98.2°F | Resp 18 | Wt 231.5 lb

## 2023-04-15 DIAGNOSIS — K31811 Angiodysplasia of stomach and duodenum with bleeding: Secondary | ICD-10-CM | POA: Diagnosis not present

## 2023-04-15 DIAGNOSIS — D649 Anemia, unspecified: Secondary | ICD-10-CM

## 2023-04-15 DIAGNOSIS — D500 Iron deficiency anemia secondary to blood loss (chronic): Secondary | ICD-10-CM

## 2023-04-15 LAB — CBC WITH DIFFERENTIAL/PLATELET
Abs Immature Granulocytes: 0.03 10*3/uL (ref 0.00–0.07)
Basophils Absolute: 0.1 10*3/uL (ref 0.0–0.1)
Basophils Relative: 1 %
Eosinophils Absolute: 0.4 10*3/uL (ref 0.0–0.5)
Eosinophils Relative: 4 %
HCT: 31.1 % — ABNORMAL LOW (ref 36.0–46.0)
Hemoglobin: 8.5 g/dL — ABNORMAL LOW (ref 12.0–15.0)
Immature Granulocytes: 0 %
Lymphocytes Relative: 26 %
Lymphs Abs: 2.1 10*3/uL (ref 0.7–4.0)
MCH: 19.9 pg — ABNORMAL LOW (ref 26.0–34.0)
MCHC: 27.3 g/dL — ABNORMAL LOW (ref 30.0–36.0)
MCV: 72.8 fL — ABNORMAL LOW (ref 80.0–100.0)
Monocytes Absolute: 0.5 10*3/uL (ref 0.1–1.0)
Monocytes Relative: 7 %
Neutro Abs: 5.2 10*3/uL (ref 1.7–7.7)
Neutrophils Relative %: 62 %
Platelets: 223 10*3/uL (ref 150–400)
RBC: 4.27 MIL/uL (ref 3.87–5.11)
RDW: 26.1 % — ABNORMAL HIGH (ref 11.5–15.5)
Smear Review: ADEQUATE
WBC: 8.3 10*3/uL (ref 4.0–10.5)
nRBC: 0 % (ref 0.0–0.2)

## 2023-04-15 LAB — SAMPLE TO BLOOD BANK

## 2023-04-15 MED ORDER — FAMOTIDINE IN NACL 20-0.9 MG/50ML-% IV SOLN
20.0000 mg | Freq: Once | INTRAVENOUS | Status: AC
  Administered 2023-04-15: 20 mg via INTRAVENOUS
  Filled 2023-04-15: qty 50

## 2023-04-15 MED ORDER — SODIUM CHLORIDE 0.9 % IV SOLN
1000.0000 mg | Freq: Once | INTRAVENOUS | Status: AC
  Administered 2023-04-15: 1000 mg via INTRAVENOUS
  Filled 2023-04-15: qty 1000

## 2023-04-15 MED ORDER — SODIUM CHLORIDE 0.9 % IV SOLN: Freq: Once | INTRAVENOUS | Status: AC

## 2023-04-15 MED ORDER — ACETAMINOPHEN 325 MG PO TABS
650.0000 mg | ORAL_TABLET | Freq: Once | ORAL | Status: AC
  Administered 2023-04-15: 650 mg via ORAL
  Filled 2023-04-15: qty 2

## 2023-04-15 MED ORDER — CETIRIZINE HCL 10 MG/ML IV SOLN
10.0000 mg | Freq: Once | INTRAVENOUS | Status: AC
Start: 2023-04-15 — End: 2023-04-15
  Administered 2023-04-15: 10 mg via INTRAVENOUS
  Filled 2023-04-15: qty 1

## 2023-04-15 NOTE — Progress Notes (Signed)
Patient presents today for iron infusion. Patient is in satisfactory condition with no new complaints voiced.  Vital signs are stable.  We will proceed with infusion per provider orders.    Peripheral IV started with good blood return pre and post infusion.  Monoferric 1,000 mg given today per MD orders. Tolerated infusion without adverse affects. Vital signs stable. No complaints at this time. Discharged from clinic ambulatory in stable condition. Alert and oriented x 3. F/U with [facility] as scheduled.

## 2023-04-15 NOTE — Patient Instructions (Signed)
 [facility] **VISIT SUMMARY & IMPORTANT INSTRUCTIONS **   You were seen today by Pleasant Barefoot PA-C for your iron  deficiency anemia.   Your iron  deficiency anemia is related to bleeding from your stomach. You were given IV iron  (Monoferric ) in clinic today. Will recheck labs and see you for office visit in 6 weeks.  ** CONTACT OUR OFFICE IF: You notice any bright red blood in the toilet, black tarry bowel movements, or coffee ground bowel movements/vomit. You have any symptoms of worsening anemia such as increased fatigue, cravings for ice, lightheadedness, or aching in your legs.  ** SEEK EMERGENCY MEDICAL ATTENTION if you have any symptoms of severe anemia such as loss of consciousness, chest pain, or difficulty breathing.  FOLLOW-UP APPOINTMENT: 6 weeks  ** Thank you for trusting me with your healthcare!  I strive to provide all of my patients with quality care at each visit.  If you receive a survey for this visit, I would be so grateful to you for taking the time to provide feedback.  Thank you in advance!  ~ Latisa Belay                   Dr. Alean Stands   &   Pleasant Barefoot, PA-C   - - - - - - - - - - - - - - - - - -    Thank you for choosing  [facility] Center at [facility] to provide your oncology and hematology care.  To afford each patient quality time with our provider, please arrive at least 15 minutes before your scheduled appointment time.   If you have a lab appointment with the Cancer Center please come in thru the Main Entrance and check in at the main information desk.  You need to re-schedule your appointment should you arrive 10 or more minutes late.  We strive to give you quality time with our providers, and arriving late affects you and other patients whose appointments are after yours.  Also, if you no show three or more times for appointments you may be dismissed from the clinic at the providers  discretion.     Again, thank you for choosing [facility].  Our hope is that these requests will decrease the amount of time that you wait before being seen by our physicians.       _____________________________________________________________  Should you have questions after your visit to [facility], please contact our office at [phone] and follow the prompts.  Our office hours are 8:00 a.m. and 4:30 p.m. Monday - Friday.  Please note that voicemails left after 4:00 p.m. may not be returned until the following business day.  We are closed weekends and major holidays.  You do have access to a nurse 24-7, just call the main number to the clinic [phone] and do not press any options, hold on the line and a nurse will answer the phone.    For prescription refill requests, have your pharmacy contact our office and allow 72 hours.

## 2023-04-15 NOTE — Patient Instructions (Signed)
 [facility] [place] - A DEPT OF [facility]  Discharge Instructions: Thank you for choosing [facility] to provide your oncology and hematology care.  If you have a lab appointment with the Cancer Center - please note that after April 8th, 2024, all labs will be drawn in the cancer center.  You do not have to check in or register with the main entrance as you have in the past but will complete your check-in in the cancer center.  Wear comfortable clothing and clothing appropriate for easy access to any Portacath or PICC line.   We strive to give you quality time with your provider. You may need to reschedule your appointment if you arrive late (15 or more minutes).  Arriving late affects you and other patients whose appointments are after yours.  Also, if you miss three or more appointments without notifying the office, you may be dismissed from the clinic at the provider's discretion.      For prescription refill requests, have your pharmacy contact our office and allow 72 hours for refills to be completed.    Today you received Monoferric IV iron infusion.     BELOW ARE SYMPTOMS THAT SHOULD BE REPORTED IMMEDIATELY: *FEVER GREATER THAN 100.4 F (38 C) OR HIGHER *CHILLS OR SWEATING *NAUSEA AND VOMITING THAT IS NOT CONTROLLED WITH YOUR NAUSEA MEDICATION *UNUSUAL SHORTNESS OF BREATH *UNUSUAL BRUISING OR BLEEDING *URINARY PROBLEMS (pain or burning when urinating, or frequent urination) *BOWEL PROBLEMS (unusual diarrhea, constipation, pain near the anus) TENDERNESS IN MOUTH AND THROAT WITH OR WITHOUT PRESENCE OF ULCERS (sore throat, sores in mouth, or a toothache) UNUSUAL RASH, SWELLING OR PAIN  UNUSUAL VAGINAL DISCHARGE OR ITCHING   Items with * indicate a potential emergency and should be followed up as soon as possible or go to the Emergency Department if any problems should occur.  Please show the CHEMOTHERAPY ALERT CARD or IMMUNOTHERAPY ALERT CARD  at check-in to the Emergency Department and triage nurse.  Should you have questions after your visit or need to cancel or reschedule your appointment, please contact [facility] [place] - A DEPT OF Eligha Bridegroom [facility] [phone]  and follow the prompts.  Office hours are 8:00 a.m. to 4:30 p.m. Monday - Friday. Please note that voicemails left after 4:00 p.m. may not be returned until the following business day.  We are closed weekends and major holidays. You have access to a nurse at all times for urgent questions. Please call the main number to the clinic [phone] and follow the prompts.  For any non-urgent questions, you may also contact your provider using MyChart. We now offer e-Visits for anyone 35 and older to request care online for non-urgent symptoms. For details visit [email or web address].   Also download the MyChart app! Go to the app store, search "MyChart", open the app, select [place], and log in with your MyChart username and password.

## 2023-04-16 DIAGNOSIS — E1165 Type 2 diabetes mellitus with hyperglycemia: Secondary | ICD-10-CM | POA: Diagnosis not present

## 2023-04-16 DIAGNOSIS — A09 Infectious gastroenteritis and colitis, unspecified: Secondary | ICD-10-CM | POA: Diagnosis not present

## 2023-04-16 DIAGNOSIS — A419 Sepsis, unspecified organism: Secondary | ICD-10-CM | POA: Diagnosis not present

## 2023-04-16 DIAGNOSIS — J449 Chronic obstructive pulmonary disease, unspecified: Secondary | ICD-10-CM | POA: Diagnosis not present

## 2023-04-17 ENCOUNTER — Ambulatory Visit: Payer: Self-pay

## 2023-04-17 DIAGNOSIS — E1165 Type 2 diabetes mellitus with hyperglycemia: Secondary | ICD-10-CM | POA: Diagnosis not present

## 2023-04-17 DIAGNOSIS — J449 Chronic obstructive pulmonary disease, unspecified: Secondary | ICD-10-CM | POA: Diagnosis not present

## 2023-04-17 DIAGNOSIS — A419 Sepsis, unspecified organism: Secondary | ICD-10-CM | POA: Diagnosis not present

## 2023-04-17 DIAGNOSIS — A09 Infectious gastroenteritis and colitis, unspecified: Secondary | ICD-10-CM | POA: Diagnosis not present

## 2023-04-17 NOTE — Patient Outreach (Signed)
  Care Coordination   Follow Up Visit Note   04/17/2023 Name: Wendy Kramer MRN: [id number] DOB: [birth year]  Wendy Kramer is a 63 y.o. year old female who sees Wendy Kramer, Wendy Salinas, MD for primary care. I spoke with  Wendy Kramer by phone today.  What matters to the patients health and wellness today?  Patient need heating repairs.    Goals Addressed             This Visit's Progress    Heating Assistance       Interventions Today    Flowsheet Row Most Recent Value  General Interventions   General Interventions Discussed/Reviewed General Interventions Discussed, General Interventions Reviewed  [Pt has not received a return call from Home Rehab program. Pt will call again today. DSS LIEAP payment was sent and pt has a credit with the oil company. Pt calls back to report she spoke to Home Rehab and 33m-22yr waiting list. Pt will still apply.]              SDOH assessments and interventions completed:  No     Care Coordination Interventions:  Yes, provided   Follow up plan: Follow up call scheduled for 05/16/23 at 11:30am    Encounter Outcome:  Patient Visit Completed

## 2023-04-17 NOTE — Patient Instructions (Signed)
Visit Information  Thank you for taking time to visit with me today. Please don't hesitate to contact me if I can be of assistance to you.   Following are the goals we discussed today:  Patient will complete application for Home Rehab program.   Our next appointment is by telephone on 05/16/23 at 11:30am  Please call the care guide team at [phone] if you need to cancel or reschedule your appointment.   If you are experiencing a Mental Health or Behavioral Health Crisis or need someone to talk to, please call 911  Patient verbalizes understanding of instructions and care plan provided today and agrees to view in MyChart. Active MyChart status and patient understanding of how to access instructions and care plan via MyChart confirmed with patient.     Telephone follow up appointment with care management team member scheduled for:05/16/23 at 11:30am   Lysle Morales, BSW [place]  [facility], [facility] Social Worker Direct Dial: [phone]  Fax: [phone] Website: [email or web address]

## 2023-04-18 ENCOUNTER — Ambulatory Visit: Payer: Self-pay | Admitting: *Deleted

## 2023-04-18 NOTE — Patient Outreach (Signed)
  Care Coordination   04/18/2023 Name: Wendy Kramer MRN: [id number] DOB: July 22, 1960   Care Coordination Outreach Attempts:  An unsuccessful outreach was attempted for an appointment today.  Follow Up Plan:  Additional outreach attempts will be made to offer the patient complex care management information and services.   Encounter Outcome:  No Answer. Left HIPAA compliant VM.   Care Coordination Interventions:  No, not indicated    Demetrios Loll, RN, BSN [place]  [facility], [facility] Health RN Care Manager Direct Dial: [phone]

## 2023-04-26 NOTE — Progress Notes (Unsigned)
Referring Provider: Benetta Spar* Primary Care Physician:  Benetta Spar, MD Primary GI Physician: Dr. Jena Gauss  No chief complaint on file.   HPI:   Wendy Kramer is a 63 y.o. 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   Admitted September 2024 and underwent EGD and colonoscopy performed with findings significant for PHG and GAVE with bleeding s/p APC.   Most recent admission January 2025 after presenting with 1 week og fatigue, diarrhea, and lack of appetite. Found to have acute on chronic anemia and focal ascending colitis on CT. She was started on empiric antibiotics. She was found to have campylobacter on stool studies, but by the time they resulted, diarrhea had resolved and she was prescribed any additional antibiotics. She received a total of 3 days of IV antibiotics. She underwent EGD 1/7 with moderate PHG, moderate GAVE without bleeding s/p APC, otherwise normal exam. Recommended repeat EGD in 4 weeks. Hgb at discharge was 7.4.   She is scheduled for EGD with Dr. Jena Gauss on 2/19.   Labs updated 1/14 with Hgb improved to 8.5. She was given IV iron on 1/14 by hematology.    Today:      EGD 04/08/23: - Normal esophagus.  - Portal hypertensive gastropathy.  - Gastric antral vascular ectasia without bleeding. Treated with argon plasma coagulation ( APC) .  - Normal examined duodenum.  EGD 12/27/22: - Portal hypertensive gastropathy.  - Gastric antral vascular ectasia with bleeding. Treated with argon plasma coagulation ( APC) . Likely cause of iron deficiency anemia  -Normal duodenal bulb and second portion of the duodenum.   Colonoscopy 12/27/22: - Diverticulosis in the left colon.  - Medium- sized lipoma in the transverse colon.  - Non- bleeding external and internal hemorrhoids.  - No specimens collected.   - Recommended repeat  colonoscopy in 5 years.  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   Unspecified asthma with (acute) exacerbation    Vitamin D deficiency     Past Surgical History:  Procedure Laterality Date   BIOPSY  03/05/2018   Procedure: BIOPSY;  Surgeon: Corbin Ade, MD;  [facility];  Service: Endoscopy;;  gastric   CHOLECYSTECTOMY     COLONOSCOPY WITH PROPOFOL N/A 03/05/2018   Dr. Jena Gauss: Diverticulosis in the sigmoid and descending colon.  3 colon polyps removed, 2 hyperplastic and one sessile serrated polyp.  Next colonoscopy 3 years.   COLONOSCOPY WITH PROPOFOL N/A 12/27/2022   Procedure: COLONOSCOPY WITH PROPOFOL;  Surgeon: Franky Macho, MD;  [facility];  Service: Endoscopy;  Laterality: N/A;   ESOPHAGOGASTRODUODENOSCOPY (EGD) WITH PROPOFOL N/A 03/05/2018   Dr. Jena Gauss: Erosive reflux esophagitis, small hiatal hernia, erosive gastropathy with reactive gastropathy on biopsy.  No H. pylori.   ESOPHAGOGASTRODUODENOSCOPY (EGD) WITH PROPOFOL N/A 12/27/2022   Procedure: ESOPHAGOGASTRODUODENOSCOPY (EGD) WITH PROPOFOL;  Surgeon: Franky Macho, MD;  [facility];  Service: Endoscopy;  Laterality: N/A;   ESOPHAGOGASTRODUODENOSCOPY (EGD) WITH PROPOFOL N/A 04/08/2023   Procedure: ESOPHAGOGASTRODUODENOSCOPY (EGD) WITH PROPOFOL;  Surgeon: Dolores Frame, MD;  [facility];  Service: Gastroenterology;  Laterality: N/A;   HERNIA REPAIR     umbilical    HOT HEMOSTASIS  12/27/2022   Procedure: HOT HEMOSTASIS (ARGON PLASMA COAGULATION/BICAP);  Surgeon: Franky Macho, MD;  [facility];  Service: Endoscopy;;   POLYPECTOMY  03/05/2018   Procedure: POLYPECTOMY;  Surgeon: Corbin Ade, MD;  [facility];  Service: Endoscopy;;  colon   SHOULDER ARTHROSCOPY Right    TEE WITHOUT CARDIOVERSION  2018   patent foramen ovale   TONSILLECTOMY AND ADENOIDECTOMY     TUBAL LIGATION      Current Outpatient Medications  Medication Sig Dispense Refill   Accu-Chek Softclix Lancets lancets Use as instructed to monitor glucose 1 times daily. 100 each 5   acetaminophen (TYLENOL) 500 MG tablet Take 1,000 mg by mouth every 6 (six) hours as needed for moderate pain (pain score 4-6).     albuterol (PROVENTIL HFA;VENTOLIN HFA) 108 (90 Base) MCG/ACT inhaler Inhale 1 puff into the lungs every 6 (six) hours as needed for wheezing or shortness of breath.     Blood Glucose Monitoring Suppl (ACCU-CHEK GUIDE ME) w/Device KIT 1 Piece by Does not apply route as directed. 1 kit 0   BREO ELLIPTA 200-25 MCG/ACT AEPB Inhale 1 puff into the lungs daily in the afternoon.     ferrous sulfate 325 (65 FE) MG tablet Take 1 tablet (325 mg total) by mouth daily with breakfast. 60 tablet 0   fluticasone (FLONASE) 50 MCG/ACT nasal spray Place 2 sprays into both nostrils daily as needed for allergies or rhinitis.     glucose blood (ACCU-CHEK GUIDE) test strip USE TO CHECK BLOOD SUGARS ONCE DAILY 100 strip 1   lisinopril (ZESTRIL) 20 MG tablet Take 10 mg by mouth every evening.     metFORMIN (GLUCOPHAGE) 500 MG tablet Take 1 tablet (500 mg total) by mouth 2 (two) times daily with a meal. 180 tablet 3   Multiple Vitamins-Minerals (ONE DAILY MULTIVIT/IRON-FREE) TABS Take 1 tablet by mouth every evening.     pantoprazole (PROTONIX) 40 MG tablet Take 1 tablet (40 mg total) by mouth 2 (two) times daily. 60 tablet 0   traMADol (ULTRAM) 50 MG tablet Take 100 mg by mouth  every 8 (eight) hours as needed for moderate pain (pain score 4-6).  3   No current facility-administered medications for this visit.    Allergies as of 04/28/2023 - Review Complete 04/15/2023  Allergen Reaction Noted   Asa [aspirin] Anaphylaxis 09/24/2016   Augmentin [amoxicillin-pot clavulanate] Nausea And Vomiting and Other (See Comments) 09/24/2016   Levaquin [levofloxacin in d5w] Itching and Other (See Comments) 09/24/2016   Lovenox [enoxaparin sodium] Other (See Comments) 09/24/2016    Family History  Problem Relation Age of Onset   Breast cancer Maternal Aunt    Colon cancer Other        maternal great grandfather   Liver disease Brother        Fatty liver   HIV Brother    Hypertension Mother    Hyperlipidemia Mother    Hypertension Father    Thyroid disease Sister    Pancreatic  cancer Sister    Healthy Daughter    Healthy Son    Healthy Son    Heart disease Son     Social History   Socioeconomic History   Marital status: Legally Separated    Spouse name: Not on file   Number of children: Not on file   Years of education: Not on file   Highest education level: Not on file  Occupational History   Not on file  Tobacco Use   Smoking status: Every Day    Current packs/day: 0.50    Average packs/day: 0.5 packs/day for 40.0 years (20.0 ttl pk-yrs)    Types: Cigarettes   Smokeless tobacco: Never  Vaping Use   Vaping status: Never Used  Substance and Sexual Activity   Alcohol use: No   Drug use: No   Sexual activity: Not Currently    Birth control/protection: Post-menopausal, Surgical    Comment: tubal  Other Topics Concern   Not on file  Social History Narrative   Not on file   Social Drivers of Health   Financial Resource Strain: Low Risk  (01/28/2023)   Overall Financial Resource Strain (CARDIA)    Difficulty of Paying Living Expenses: Not very hard  Food Insecurity: No Food Insecurity (04/10/2023)   Hunger Vital Sign    Worried About Running Out of  Food in the Last Year: Never true    Ran Out of Food in the Last Year: Never true  Recent Concern: Food Insecurity - Food Insecurity Present (03/18/2023)   Hunger Vital Sign    Worried About Running Out of Food in the Last Year: Never true    Ran Out of Food in the Last Year: Sometimes true  Transportation Needs: No Transportation Needs (04/11/2023)   PRAPARE - Administrator, Civil Service (Medical): No    Lack of Transportation (Non-Medical): No  Physical Activity: Insufficiently Active (02/17/2023)   Exercise Vital Sign    Days of Exercise per Week: 3 days    Minutes of Exercise per Session: 30 min  Stress: No Stress Concern Present (06/04/2022)   [employer] of Occupational Health - Occupational Stress Questionnaire    Feeling of Stress : Only a little  Social Connections: Socially Isolated (04/11/2023)   Social Connection and Isolation Panel [NHANES]    Frequency of Communication with Friends and Family: More than three times a week    Frequency of Social Gatherings with Friends and Family: More than three times a week    Attends Religious Services: Never    Database administrator or Organizations: No    Attends Banker Meetings: Never    Marital Status: Separated    Review of Systems: Gen: Denies fever, chills, anorexia. Denies fatigue, weakness, weight loss.  CV: Denies chest pain, palpitations, syncope, peripheral edema, and claudication. Resp: Denies dyspnea at rest, cough, wheezing, coughing up blood, and pleurisy. GI: Denies vomiting blood, jaundice, and fecal incontinence.   Denies dysphagia or odynophagia. Derm: Denies rash, itching, dry skin Psych: Denies depression, anxiety, memory loss, confusion. No homicidal or suicidal ideation.  Heme: Denies bruising, bleeding, and enlarged lymph nodes.  Physical Exam: LMP 09/06/2016  General:   Alert and oriented. No distress noted. Pleasant and cooperative.  Head:  Normocephalic and  atraumatic. Eyes:  Conjuctiva clear without scleral icterus. Heart:  S1, S2 present without murmurs appreciated. Lungs:  Clear to auscultation bilaterally. No wheezes, rales, or rhonchi. No distress.  Abdomen:  +BS, soft, non-tender and  non-distended. No rebound or guarding. No HSM or masses noted. Msk:  Symmetrical without gross deformities. Normal posture. Extremities:  Without edema. Neurologic:  Alert and  oriented x4 Psych:  Normal mood and affect.    Assessment:     Plan:  ***   Wendy Memos, PA-C [facility] Gastroenter[facility] 04/28/2023

## 2023-04-28 ENCOUNTER — Encounter: Payer: Self-pay | Admitting: Gastroenterology

## 2023-04-28 ENCOUNTER — Ambulatory Visit (INDEPENDENT_AMBULATORY_CARE_PROVIDER_SITE_OTHER): Payer: Medicare Other | Admitting: Gastroenterology

## 2023-04-28 ENCOUNTER — Other Ambulatory Visit (HOSPITAL_COMMUNITY): Payer: Medicare Other

## 2023-04-28 VITALS — BP 138/80 | HR 92 | Temp 97.9°F | Ht 63.0 in | Wt 225.4 lb

## 2023-04-28 DIAGNOSIS — K3189 Other diseases of stomach and duodenum: Secondary | ICD-10-CM

## 2023-04-28 DIAGNOSIS — Z8719 Personal history of other diseases of the digestive system: Secondary | ICD-10-CM | POA: Diagnosis not present

## 2023-04-28 DIAGNOSIS — K7581 Nonalcoholic steatohepatitis (NASH): Secondary | ICD-10-CM | POA: Diagnosis not present

## 2023-04-28 DIAGNOSIS — D649 Anemia, unspecified: Secondary | ICD-10-CM | POA: Diagnosis not present

## 2023-04-28 DIAGNOSIS — K219 Gastro-esophageal reflux disease without esophagitis: Secondary | ICD-10-CM

## 2023-04-28 DIAGNOSIS — K7460 Unspecified cirrhosis of liver: Secondary | ICD-10-CM

## 2023-04-28 DIAGNOSIS — R1013 Epigastric pain: Secondary | ICD-10-CM

## 2023-04-28 DIAGNOSIS — R195 Other fecal abnormalities: Secondary | ICD-10-CM | POA: Diagnosis not present

## 2023-04-28 DIAGNOSIS — D509 Iron deficiency anemia, unspecified: Secondary | ICD-10-CM

## 2023-04-28 LAB — MISCELLANEOUS TEST

## 2023-04-28 NOTE — Patient Instructions (Addendum)
We will proceed with upper endoscopy as already scheduled.  Please have labs completed at [facility].  Continue taking pantoprazole 40 mg twice daily 30 minutes before breakfast and 30 minutes before dinner.  Continue to avoid all NSAIDs.  Monitor your upper abdominal pain/back pain.  If any severe/unrelenting symptoms, please proceed to the emergency room.  Otherwise, you can notify me of any worsening symptoms.  You may try adding Benefiber daily to help with intermittent loose stools.  Start with 3 teaspoons daily for 2 weeks, then increase to twice daily.   We will see you back in the office after your upper endoscopy.   It was good to see you today!     Ermalinda Memos, PA-C [facility] Gastroenterology

## 2023-04-29 ENCOUNTER — Telehealth: Payer: Self-pay

## 2023-04-29 NOTE — Telephone Encounter (Signed)
Pt called and wants to know if you wanted her to go ahead and have the labs that you ordered yesterday done now or right before her procedures?

## 2023-04-29 NOTE — Telephone Encounter (Signed)
She can go ahead and have them done now.

## 2023-04-29 NOTE — Telephone Encounter (Signed)
Pt was made aware and verbalized understanding.

## 2023-04-30 ENCOUNTER — Other Ambulatory Visit (HOSPITAL_COMMUNITY)
Admission: RE | Admit: 2023-04-30 | Discharge: 2023-04-30 | Disposition: A | Discharge status: Home / Self Care | Payer: Medicare Other | Source: Ambulatory Visit | Attending: Gastroenterology | Admitting: Gastroenterology

## 2023-04-30 ENCOUNTER — Encounter: Payer: Self-pay | Admitting: *Deleted

## 2023-04-30 ENCOUNTER — Ambulatory Visit: Payer: Self-pay | Admitting: *Deleted

## 2023-04-30 ENCOUNTER — Encounter (HOSPITAL_COMMUNITY): Payer: Self-pay

## 2023-04-30 ENCOUNTER — Ambulatory Visit (HOSPITAL_COMMUNITY): Admit: 2023-04-30 | Payer: Medicare Other | Admitting: Internal Medicine

## 2023-04-30 DIAGNOSIS — R1013 Epigastric pain: Secondary | ICD-10-CM | POA: Diagnosis not present

## 2023-04-30 LAB — CBC WITH DIFFERENTIAL/PLATELET
Abs Immature Granulocytes: 0.02 10*3/uL (ref 0.00–0.07)
Basophils Absolute: 0.1 10*3/uL (ref 0.0–0.1)
Basophils Relative: 1 %
Eosinophils Absolute: 0.3 10*3/uL (ref 0.0–0.5)
Eosinophils Relative: 4 %
HCT: 38.3 % (ref 36.0–46.0)
Hemoglobin: 11 g/dL — ABNORMAL LOW (ref 12.0–15.0)
Immature Granulocytes: 0 %
Lymphocytes Relative: 25 %
Lymphs Abs: 1.6 10*3/uL (ref 0.7–4.0)
MCH: 23.1 pg — ABNORMAL LOW (ref 26.0–34.0)
MCHC: 28.7 g/dL — ABNORMAL LOW (ref 30.0–36.0)
MCV: 80.5 fL (ref 80.0–100.0)
Monocytes Absolute: 0.6 10*3/uL (ref 0.1–1.0)
Monocytes Relative: 9 %
Neutro Abs: 4 10*3/uL (ref 1.7–7.7)
Neutrophils Relative %: 61 %
Platelets: 162 10*3/uL (ref 150–400)
RBC: 4.76 MIL/uL (ref 3.87–5.11)
WBC: 6.6 10*3/uL (ref 4.0–10.5)
nRBC: 0 % (ref 0.0–0.2)

## 2023-04-30 LAB — COMPREHENSIVE METABOLIC PANEL
ALT: 16 U/L (ref 0–44)
AST: 28 U/L (ref 15–41)
Albumin: 3.5 g/dL (ref 3.5–5.0)
Alkaline Phosphatase: 86 U/L (ref 38–126)
Anion gap: 8 (ref 5–15)
BUN: 10 mg/dL (ref 8–23)
CO2: 26 mmol/L (ref 22–32)
Calcium: 9.1 mg/dL (ref 8.9–10.3)
Chloride: 102 mmol/L (ref 98–111)
Creatinine, Ser: 0.66 mg/dL (ref 0.44–1.00)
GFR, Estimated: >60 mL/min (ref >60)
Glucose, Bld: 178 mg/dL — ABNORMAL HIGH (ref 70–99)
Potassium: 4.5 mmol/L (ref 3.5–5.1)
Sodium: 136 mmol/L (ref 135–145)
Total Bilirubin: 0.8 mg/dL (ref 0.0–1.2)
Total Protein: 7.2 g/dL (ref 6.5–8.1)

## 2023-04-30 LAB — LIPASE, BLOOD: Lipase: 29 U/L (ref 11–51)

## 2023-04-30 SURGERY — ESOPHAGOGASTRODUODENOSCOPY (EGD) WITH PROPOFOL
Anesthesia: Monitor Anesthesia Care

## 2023-04-30 NOTE — Patient Outreach (Signed)
  [employer] Care Coordination   Follow Up Visit Note   04/30/2023 Name: Wendy Kramer MRN: [id number] DOB: Jan 07, 1961  Wendy Kramer is a 63 y.o. year old female who sees Wendy Kramer, Wendy Area, Wendy Kramer for primary care. I spoke with  Bobbette Kramer by phone today.  What matters to the patients health and wellness today?  Managing anemia and blood sugar    Goals Addressed   None     SDOH assessments and interventions completed:  No    Care Coordination Interventions:  Yes, provided   Follow up plan: Follow up call scheduled for 05/28/23    Encounter Outcome:  Patient Visit Completed   Wendy Pellet, RN, BSN [place]  [facility], [facility] RN Care Manager Direct Dial: [phone]

## 2023-05-01 DIAGNOSIS — F17210 Nicotine dependence, cigarettes, uncomplicated: Secondary | ICD-10-CM | POA: Diagnosis not present

## 2023-05-01 DIAGNOSIS — J449 Chronic obstructive pulmonary disease, unspecified: Secondary | ICD-10-CM | POA: Diagnosis not present

## 2023-05-01 DIAGNOSIS — F17200 Nicotine dependence, unspecified, uncomplicated: Secondary | ICD-10-CM | POA: Diagnosis not present

## 2023-05-01 DIAGNOSIS — I83812 Varicose veins of left lower extremities with pain: Secondary | ICD-10-CM | POA: Diagnosis not present

## 2023-05-01 DIAGNOSIS — E1165 Type 2 diabetes mellitus with hyperglycemia: Secondary | ICD-10-CM | POA: Diagnosis not present

## 2023-05-01 DIAGNOSIS — I10 Essential (primary) hypertension: Secondary | ICD-10-CM | POA: Diagnosis not present

## 2023-05-05 ENCOUNTER — Telehealth: Payer: Self-pay

## 2023-05-05 NOTE — Telephone Encounter (Signed)
Pt called and lmom regarding pain in her abdomen and is requesting a return call from the nurse.

## 2023-05-06 NOTE — Telephone Encounter (Signed)
 Noted

## 2023-05-06 NOTE — Telephone Encounter (Signed)
Spoke to pt, she informed me that she was having left lower side pain. She states it felt like a pulled muscle. She states it has resolved it's self. Informed to call if the pain comes back.

## 2023-05-08 ENCOUNTER — Other Ambulatory Visit: Payer: Self-pay | Admitting: Gastroenterology

## 2023-05-08 MED ORDER — PANTOPRAZOLE SODIUM 40 MG PO TBEC: 40.0000 mg | DELAYED_RELEASE_TABLET | Freq: Two times a day (BID) | ORAL | 3 refills | Status: DC

## 2023-05-16 ENCOUNTER — Ambulatory Visit: Payer: Medicare Other

## 2023-05-16 NOTE — Patient Outreach (Signed)
  Care Coordination   05/16/2023 Name: Wendy Kramer MRN: [id number] DOB: 06/11/1960   Care Coordination Outreach Attempts:  An unsuccessful outreach was attempted for an appointment today.  Follow Up Plan:  Additional outreach attempts will be made to offer the patient complex care management information and services.   Encounter Outcome:  No Answer   Care Coordination Interventions:  No, not indicated    Lysle Morales, BSW [place]  [facility], [facility] Social Worker Direct Dial: [phone]  Fax: [phone] Website: [email or web address]

## 2023-05-19 ENCOUNTER — Encounter: Payer: Self-pay | Admitting: *Deleted

## 2023-05-19 ENCOUNTER — Other Ambulatory Visit: Payer: Self-pay | Admitting: *Deleted

## 2023-05-19 ENCOUNTER — Telehealth: Payer: Self-pay | Admitting: *Deleted

## 2023-05-19 ENCOUNTER — Encounter (HOSPITAL_COMMUNITY): Admission: RE | Admit: 2023-05-19 | Payer: Medicare Other | Source: Ambulatory Visit

## 2023-05-19 NOTE — Telephone Encounter (Signed)
Waiting on response from Dr

## 2023-05-19 NOTE — Telephone Encounter (Signed)
Pt wants to know if she needs to have her hemoglobin checked since she had to reschedule her procedure. Please advise. Thank you

## 2023-05-19 NOTE — Telephone Encounter (Signed)
Pt has been rescheduled from 05/21/23 until 06/11/23. Updated instructions mailed to pt.   reschedule Received: Today Wendy Amis, RN  [place], Ewell Poe, CMA; Marlowe Shores, LPN; Elinor Dodge, LPN; Nobie Putnam Happy Monday! I just spoke to [facility] on the phone and she wants to reschedule her procedure due to possible bad weather on Wednesday.

## 2023-05-20 ENCOUNTER — Other Ambulatory Visit: Payer: Self-pay

## 2023-05-20 DIAGNOSIS — D509 Iron deficiency anemia, unspecified: Secondary | ICD-10-CM

## 2023-05-20 NOTE — Telephone Encounter (Signed)
 noted

## 2023-05-20 NOTE — Telephone Encounter (Signed)
Addressed under telephone note 05/19/23. Requested for CBC to be arranged.

## 2023-05-20 NOTE — Telephone Encounter (Signed)
Labs have been discussed with the pt. Now needs to be rescheduled

## 2023-05-20 NOTE — Telephone Encounter (Signed)
We can recheck her hemoglobin to ensure stability. Please arrange CBC. Dx: IDA.

## 2023-05-22 ENCOUNTER — Ambulatory Visit: Payer: Self-pay

## 2023-05-22 NOTE — Patient Outreach (Signed)
  Care Coordination   Follow Up Visit Note   05/22/2023 Name: Wendy Kramer MRN: [id number] DOB: 31-Dec-1960  Wendy Kramer is a 63 y.o. year old female who sees [name], Wayland Salinas, MD for primary care. I spoke with  Wendy Kramer by phone today.  What matters to the patients health and wellness today?  Patient has been able to apply for assistance for home repairs.  Patient reports no other unmet needs.    Goals Addressed             This Visit's Progress    Heating Assistance       Interventions Today    Flowsheet Row Most Recent Value  Chronic Disease   Chronic disease during today's visit Diabetes, Other  [Cirrhosis]  General Interventions   General Interventions Discussed/Reviewed General Interventions Discussed, General Interventions Reviewed, [employer] has completed and returned the application for Home Rehab program and there is a 1 year waiting list.Light bill is $400 and SW refers pt back to DSS to apply for assistance.]              SDOH assessments and interventions completed:  No     Care Coordination Interventions:  Yes, provided   Follow up plan: No further intervention required.   Encounter Outcome:  Patient Visit Completed

## 2023-05-22 NOTE — Patient Instructions (Signed)
Visit Information  Thank you for taking time to visit with me today. Please don't hesitate to contact me if I can be of assistance to you.   Following are the goals we discussed today:  Patient will await assistance with the Home Rehab program for repairs. Patient will contact DSS for assistance with utility bill.    If you are experiencing a Mental Health or Behavioral Health Crisis or need someone to talk to, please call 911  Patient verbalizes understanding of instructions and care plan provided today and agrees to view in MyChart. Active MyChart status and patient understanding of how to access instructions and care plan via MyChart confirmed with patient.     No further follow up required: Patient does not require a follow up.   Lysle Morales, BSW [place]  [facility], [facility] Social Worker Direct Dial: [phone]  Fax: [phone] Website: [email or web address]

## 2023-05-26 NOTE — Progress Notes (Unsigned)
 [facility] [address], [place] [address]   CLINIC:  Medical Oncology/Hematology  PCP:  Benetta Spar, MD [address] [place] [address] [phone]   REASON FOR VISIT:  Follow-up for severe iron deficiency anemia from blood loss  INTERVAL HISTORY:   Wendy Kramer 63 y.o. female returns for follow-up of severe iron deficiency anemia from blood loss.  She was last seen by Rojelio Brenner PA-C on 04/15/2023.  She has not had any interim hospitalizations since her last visit.  She received IV Monoferric on 04/15/2023. *** She tolerated iron very well.  *** She reports feeling *** after IV iron. 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  *** She had some "coffee ground stool" that resolved a few days after hospital discharge. ***  Previously noted aching in legs has resolved. *** She has some dyspnea on exertion. *** She denies any pica, headaches, lightheadedness, syncope, or chest pain.  She has 80***% energy and 100***% appetite. She endorses that she is maintaining a stable weight.  ASSESSMENT & PLAN:  1.  Severe iron deficiency anemia from blood loss: - CBC on 12/26/2022 with Hb-6.5, MCV-68.  Ferritin was 3 and percent saturation 2. - She received half unit of PRBC (September 2024) and developed hives.  Transfusion was abandoned. - Successful PRBC transfusion on 04/06/2023 with premedication. - Colonoscopy (12/27/2022): Diverticulosis of the left colon, nonbleeding internal and external hemorrhoids. - EGD (12/27/2022): Portal hypertensive gastropathy, GAVE with bleeding.  Treated with APC.  Normal duodenal bulb and second part of duodenum. - Hospitalized from 04/05/2023 through 04/08/2023 due to sepsis secondary to colitis as well as acute on chronic iron deficiency anemia from blood loss, with Hgb down to 6.2 on 04/06/2023.  She received 1 unit PRBC during hospitalization, as well as IV Venofer 200 mg on 04/07/2023.  EGD  performed on 04/08/2023 showed portal hypertensive gastropathy and GAVE s/p APC. - Hematology workup (01/02/2023): Notable for severe iron deficiency.  Hemolytic workup was negative.  Copper and MMA levels are normal.  SPEP, free light chains and immunofixation were normal. - Received IV Monoferric x 1 on 04/15/2023 - Labs today (05/27/2023): *** - She continues to follow with GI and is scheduled for repeat EGD in March 2025 *** - PLAN: *** TBD *** *** Recommend IV iron with Monoferric 1 g, to be given today.  (We discussed risk of potential side effects and rare risk of allergic reaction, and patient is agreeable to proceed.) - We will premedicate with cetirizine, acetaminophen, and IV Pepcid due to history of medication allergies - Since melena has resolved and Hgb is improving, we will hold off on weekly CBCs for the time being.  However, patient notified to alert [place] if she has any worsening symptoms or any recurrence of melanotic stool. - Same-day labs (CBC/D, BB sample, ferritin, iron/TIBC) + OFFICE visit in 6 weeks  2.  Social/Family History: - Lives at home with daughter and 3 grandchildren. Retired with disability, used to be a Energy manager. Tobacco use of 0.5 ppd since age 21.  - No family history of anemia. Maternal aunt had breast cancer. Maternal great-grandfather had colon cancer.   3.  Other PMH - NASH cirrhosis, GAVE with upper GI bleed, hypertension, type 2 diabetes mellitus  PLAN SUMMARY: >> *** >> ***     REVIEW OF SYSTEMS: ***  Review of Systems  Constitutional:  Positive for fatigue. Negative for appetite change, chills,  diaphoresis, fever and unexpected weight change.  HENT:   Negative for lump/mass and nosebleeds.   Eyes:  Negative for eye problems.  Respiratory:  Positive for shortness of breath. Negative for cough and hemoptysis.   Cardiovascular:  Negative for chest pain, leg swelling and palpitations.  Gastrointestinal:  Positive for diarrhea. Negative for  abdominal pain, blood in stool, constipation, nausea and vomiting.  Genitourinary:  Negative for hematuria.   Musculoskeletal:  Positive for arthralgias.  Skin: Negative.   Neurological:  Negative for dizziness, headaches and light-headedness.  Hematological:  Does not bruise/bleed easily.     PHYSICAL EXAM:  ECOG PERFORMANCE STATUS: 1 - Symptomatic but completely ambulatory *** There were no vitals filed for this visit.  There were no vitals filed for this visit.  Physical Exam Constitutional:      Appearance: Normal appearance. She is obese.  Cardiovascular:     Heart sounds: Murmur (PFO, per patient) heard.  Pulmonary:     Breath sounds: Normal breath sounds.  Neurological:     General: No focal deficit present.     Mental Status: Mental status is at baseline.  Psychiatric:        Behavior: Behavior normal. Behavior is cooperative.     PAST MEDICAL/SURGICAL HISTORY:  Past Medical History:  Diagnosis Date   Arthritis    Cardiac murmur    Cervical spine disease    Cirrhosis (HCC)    likely secondary to NASH   Diabetes mellitus without complication (HCC)    diet controlled   Exogenous hyperlipidemia    GERD (gastroesophageal reflux disease)    History of kidney stones    HTN (hypertension)    Morbid obesity due to excess calories (HCC)    Other chronic pain    Panic disorder    Patent foramen ovale    TEE 2018   Prediabetes    Sleep apnea    Stroke (HCC)    "mini-stroke" no deficits from this.   Unspecified asthma with (acute) exacerbation    Vitamin D deficiency    Past Surgical History:  Procedure Laterality Date   BIOPSY  03/05/2018   Procedure: BIOPSY;  Surgeon: Corbin Ade, MD;  [facility];  Service: Endoscopy;;  gastric   CHOLECYSTECTOMY     COLONOSCOPY WITH PROPOFOL N/A 03/05/2018   Dr. Jena Gauss: Diverticulosis in the sigmoid and descending colon.  3 colon polyps removed, 2 hyperplastic and one sessile serrated polyp.  Next colonoscopy  3 years.   COLONOSCOPY WITH PROPOFOL N/A 12/27/2022   Procedure: COLONOSCOPY WITH PROPOFOL;  Surgeon: Franky Macho, MD;  [facility]: [facility];  Service: Endoscopy;  Laterality: N/A;   ESOPHAGOGASTRODUODENOSCOPY (EGD) WITH PROPOFOL N/A 03/05/2018   Dr. Jena Gauss: Erosive reflux esophagitis, small hiatal hernia, erosive gastropathy with reactive gastropathy on biopsy.  No H. pylori.   ESOPHAGOGASTRODUODENOSCOPY (EGD) WITH PROPOFOL N/A 12/27/2022   Procedure: ESOPHAGOGASTRODUODENOSCOPY (EGD) WITH PROPOFOL;  Surgeon: Franky Macho, MD;  [facility];  Service: Endoscopy;  Laterality: N/A;   ESOPHAGOGASTRODUODENOSCOPY (EGD) WITH PROPOFOL N/A 04/08/2023   Procedure: ESOPHAGOGASTRODUODENOSCOPY (EGD) WITH PROPOFOL;  Surgeon: Dolores Frame, MD;  [facility]: [facility];  Service: Gastroenterology;  Laterality: N/A;   HERNIA REPAIR     umbilical   HOT HEMOSTASIS  12/27/2022   Procedure: HOT HEMOSTASIS (ARGON PLASMA COAGULATION/BICAP);  Surgeon: Franky Macho, MD;  [facility]: [facility];  Service: Endoscopy;;   POLYPECTOMY  03/05/2018   Procedure: POLYPECTOMY;  Surgeon: Corbin Ade,  MD;  [facility]: [facility];  Service: Endoscopy;;  colon   SHOULDER ARTHROSCOPY Right    TEE WITHOUT CARDIOVERSION  2018   patent foramen ovale   TONSILLECTOMY AND ADENOIDECTOMY     TUBAL LIGATION      SOCIAL HISTORY:  Social History   Socioeconomic History   Marital status: Legally Separated    Spouse name: Not on file   Number of children: Not on file   Years of education: Not on file   Highest education level: Not on file  Occupational History   Not on file  Tobacco Use   Smoking status: Every Day    Current packs/day: 0.50    Average packs/day: 0.5 packs/day for 40.0 years (20.0 ttl pk-yrs)    Types: Cigarettes   Smokeless tobacco: Never  Vaping Use   Vaping status: Never Used  Substance and Sexual Activity   Alcohol use: No   Drug use: No   Sexual activity: Not  Currently    Birth control/protection: Post-menopausal, Surgical    Comment: tubal  Other Topics Concern   Not on file  Social History Narrative   Not on file   Social Drivers of Health   Financial Resource Strain: Low Risk  (01/28/2023)   Overall Financial Resource Strain (CARDIA)    Difficulty of Paying Living Expenses: Not very hard  Food Insecurity: No Food Insecurity (04/10/2023)   Hunger Vital Sign    Worried About Running Out of Food in the Last Year: Never true    Ran Out of Food in the Last Year: Never true  Recent Concern: Food Insecurity - Food Insecurity Present (03/18/2023)   Hunger Vital Sign    Worried About Running Out of Food in the Last Year: Never true    Ran Out of Food in the Last Year: Sometimes true  Transportation Needs: No Transportation Needs (04/11/2023)   PRAPARE - Administrator, Civil Service (Medical): No    Lack of Transportation (Non-Medical): No  Physical Activity: Insufficiently Active (02/17/2023)   Exercise Vital Sign    Days of Exercise per Week: 3 days    Minutes of Exercise per Session: 30 min  Stress: No Stress Concern Present (06/04/2022)   [employer] of Occupational Health - Occupational Stress Questionnaire    Feeling of Stress : Only a little  Social Connections: Socially Isolated (04/11/2023)   Social Connection and Isolation Panel [NHANES]    Frequency of Communication with Friends and Family: More than three times a week    Frequency of Social Gatherings with Friends and Family: More than three times a week    Attends Religious Services: Never    Database administrator or Organizations: No    Attends Banker Meetings: Never    Marital Status: Separated  Intimate Partner Violence: Not At Risk (04/10/2023)   Humiliation, Afraid, Rape, and Kick questionnaire    Fear of Current or Ex-Partner: No    Emotionally Abused: No    Physically Abused: No    Sexually Abused: No    FAMILY HISTORY:  Family History   Problem Relation Age of Onset   Breast cancer Maternal Aunt    Colon cancer Other        maternal great grandfather   Liver disease Brother        Fatty liver   HIV Brother    Hypertension Mother    Hyperlipidemia Mother    Hypertension Father    Thyroid disease Sister  Pancreatic cancer Sister    Healthy Daughter    Healthy Son    Healthy Son    Heart disease Son     CURRENT MEDICATIONS:  Outpatient Encounter Medications as of 05/27/2023  Medication Sig Note   Accu-Chek Softclix Lancets lancets Use as instructed to monitor glucose 1 times daily.    acetaminophen (TYLENOL) 500 MG tablet Take 1,000 mg by mouth every 6 (six) hours as needed for moderate pain (pain score 4-6).    albuterol (PROVENTIL HFA;VENTOLIN HFA) 108 (90 Base) MCG/ACT inhaler Inhale 1 puff into the lungs every 6 (six) hours as needed for wheezing or shortness of breath.    Blood Glucose Monitoring Suppl (ACCU-CHEK GUIDE ME) w/Device KIT 1 Piece by Does not apply route as directed.    BREO ELLIPTA 200-25 MCG/ACT AEPB Inhale 1 puff into the lungs daily in the afternoon.    fluticasone (FLONASE) 50 MCG/ACT nasal spray Place 2 sprays into both nostrils daily as needed for allergies or rhinitis.    glucose blood (ACCU-CHEK GUIDE) test strip USE TO CHECK BLOOD SUGARS ONCE DAILY    hydrochlorothiazide (MICROZIDE) 12.5 MG capsule Take 12.5 mg by mouth daily.    lisinopril (ZESTRIL) 20 MG tablet Take 10 mg by mouth every evening. 04/10/2023: See below, patient was splitting in two as BP was running low. Will speak to provider at next visit. Monitors BP at home.    metFORMIN (GLUCOPHAGE) 500 MG tablet Take 1 tablet (500 mg total) by mouth 2 (two) times daily with a meal.    Multiple Vitamins-Minerals (ONE DAILY MULTIVIT/IRON-FREE) TABS Take 1 tablet by mouth every evening.    pantoprazole (PROTONIX) 40 MG tablet Take 1 tablet (40 mg total) by mouth 2 (two) times daily.    traMADol (ULTRAM) 50 MG tablet Take 100 mg by mouth  every 8 (eight) hours as needed for moderate pain (pain score 4-6).    No facility-administered encounter medications on file as of 05/27/2023.    ALLERGIES:  Allergies  Allergen Reactions   Asa [Aspirin] Anaphylaxis    Tolerates other NSAIDS   Augmentin [Amoxicillin-Pot Clavulanate] Nausea And Vomiting and Other (See Comments)    Syncope   Levaquin [Levofloxacin In D5w] Itching and Other (See Comments)    "body turned red"   Lovenox [Enoxaparin Sodium] Other (See Comments)    physician instructed not to use ever again. "white veins popped out"    LABORATORY DATA:  I have reviewed the labs as listed.  CBC    Component Value Date/Time   WBC 6.6 04/30/2023 0937   RBC 4.76 04/30/2023 0937   HGB 11.0 (L) 04/30/2023 0937   HCT 38.3 04/30/2023 0937   PLT 162 04/30/2023 0937   MCV 80.5 04/30/2023 0937   MCH 23.1 (L) 04/30/2023 0937   MCHC 28.7 (L) 04/30/2023 0937   RDW Not Measured 04/30/2023 0937   LYMPHSABS 1.6 04/30/2023 0937   MONOABS 0.6 04/30/2023 0937   EOSABS 0.3 04/30/2023 0937   BASOSABS 0.1 04/30/2023 0937      Latest Ref Rng & Units 04/30/2023    9:37 AM 04/07/2023    3:55 AM 04/06/2023    5:29 AM  CMP  Glucose 70 - 99 mg/dL 308  657  846   BUN 8 - 23 mg/dL 10  15  15    Creatinine 0.44 - 1.00 mg/dL 9.62  9.52  8.41   Sodium 135 - 145 mmol/L 136  134  133   Potassium 3.5 - 5.1 mmol/L  4.5  3.7  4.0   Chloride 98 - 111 mmol/L 102  105  104   CO2 22 - 32 mmol/L 26  22  23    Calcium 8.9 - 10.3 mg/dL 9.1  8.4  8.1   Total Protein 6.5 - 8.1 g/dL 7.2     Total Bilirubin 0.0 - 1.2 mg/dL 0.8     Alkaline Phos 38 - 126 U/L 86     AST 15 - 41 U/L 28     ALT 0 - 44 U/L 16       DIAGNOSTIC IMAGING:  I have independently reviewed the relevant imaging and discussed with the patient.   WRAP UP:  All questions were answered. The patient knows to call the clinic with any problems, questions or concerns.  Medical decision making: Moderate***  Time spent on visit: I spent  20 minutes counseling the patient face to face. The total time spent in the appointment was 30 minutes and more than 50% was on counseling.  Carnella Guadalajara, PA-C  ***

## 2023-05-27 ENCOUNTER — Inpatient Hospital Stay: Payer: Medicare Other | Attending: Hematology | Admitting: Physician Assistant

## 2023-05-27 ENCOUNTER — Inpatient Hospital Stay: Payer: Medicare Other

## 2023-05-27 VITALS — BP 157/79 | HR 80 | Temp 97.4°F | Resp 20 | Wt 223.5 lb

## 2023-05-27 DIAGNOSIS — F17210 Nicotine dependence, cigarettes, uncomplicated: Secondary | ICD-10-CM | POA: Diagnosis not present

## 2023-05-27 DIAGNOSIS — K31811 Angiodysplasia of stomach and duodenum with bleeding: Secondary | ICD-10-CM | POA: Diagnosis not present

## 2023-05-27 DIAGNOSIS — D500 Iron deficiency anemia secondary to blood loss (chronic): Secondary | ICD-10-CM | POA: Insufficient documentation

## 2023-05-27 DIAGNOSIS — Z79899 Other long term (current) drug therapy: Secondary | ICD-10-CM | POA: Insufficient documentation

## 2023-05-27 LAB — CBC WITH DIFFERENTIAL/PLATELET
Abs Immature Granulocytes: 0.01 10*3/uL (ref 0.00–0.07)
Basophils Absolute: 0.1 10*3/uL (ref 0.0–0.1)
Basophils Relative: 1 %
Eosinophils Absolute: 0.6 10*3/uL — ABNORMAL HIGH (ref 0.0–0.5)
Eosinophils Relative: 9 %
HCT: 37 % (ref 36.0–46.0)
Hemoglobin: 10.8 g/dL — ABNORMAL LOW (ref 12.0–15.0)
Immature Granulocytes: 0 %
Lymphocytes Relative: 28 %
Lymphs Abs: 1.7 10*3/uL (ref 0.7–4.0)
MCH: 23.9 pg — ABNORMAL LOW (ref 26.0–34.0)
MCHC: 29.2 g/dL — ABNORMAL LOW (ref 30.0–36.0)
MCV: 82 fL (ref 80.0–100.0)
Monocytes Absolute: 0.4 10*3/uL (ref 0.1–1.0)
Monocytes Relative: 7 %
Neutro Abs: 3.2 10*3/uL (ref 1.7–7.7)
Neutrophils Relative %: 55 %
Platelets: 215 10*3/uL (ref 150–400)
RBC: 4.51 MIL/uL (ref 3.87–5.11)
RDW: 24.1 % — ABNORMAL HIGH (ref 11.5–15.5)
WBC: 5.9 10*3/uL (ref 4.0–10.5)
nRBC: 0 % (ref 0.0–0.2)

## 2023-05-27 LAB — IRON AND TIBC
Iron: 28 ug/dL (ref 28–170)
Saturation Ratios: 7 % — ABNORMAL LOW (ref 10.4–31.8)
TIBC: 434 ug/dL (ref 250–450)
UIBC: 406 ug/dL

## 2023-05-27 LAB — FERRITIN: Ferritin: 16 ng/mL (ref 11–307)

## 2023-05-27 LAB — SAMPLE TO BLOOD BANK

## 2023-05-27 NOTE — Patient Instructions (Signed)
 [facility] at [facility] **VISIT SUMMARY & IMPORTANT INSTRUCTIONS **   You were seen today by Rojelio Brenner PA-C for your iron deficiency anemia.   Your iron deficiency anemia is related to bleeding from your stomach. Your blood and iron levels are still low. We will schedule you to receive another dose of IV iron (Monoferric)  ** CONTACT OUR OFFICE IF: You notice any bright red blood in the toilet, black tarry bowel movements, or "coffee ground" bowel movements/vomit. You have any symptoms of worsening anemia such as increased fatigue, cravings for ice, lightheadedness, or aching in your legs.  ** SEEK EMERGENCY MEDICAL ATTENTION if you have any symptoms of severe anemia such as loss of consciousness, chest pain, or difficulty breathing.  FOLLOW-UP APPOINTMENT: 8 weeks  ** Thank you for trusting me with your healthcare!  I strive to provide all of my patients with quality care at each visit.  If you receive a survey for this visit, I would be so grateful to you for taking the time to provide feedback.  Thank you in advance!  ~ Gennavieve Huq                   Dr. Doreatha Massed   &   Rojelio Brenner, PA-C   - - - - - - - - - - - - - - - - - -    Thank you for choosing [facility] at [facility] to provide your oncology and hematology care.  To afford each patient quality time with our provider, please arrive at least 15 minutes before your scheduled appointment time.   If you have a lab appointment with the Cancer Center please come in thru the Main Entrance and check in at the main information desk.  You need to re-schedule your appointment should you arrive 10 or more minutes late.  We strive to give you quality time with our providers, and arriving late affects you and other patients whose appointments are after yours.  Also, if you no show three or more times for appointments you may be dismissed from the clinic at the providers  discretion.     Again, thank you for choosing [facility].  Our hope is that these requests will decrease the amount of time that you wait before being seen by our physicians.       _____________________________________________________________  Should you have questions after your visit to [facility], please contact our office at [phone] and follow the prompts.  Our office hours are 8:00 a.m. and 4:30 p.m. Monday - Friday.  Please note that voicemails left after 4:00 p.m. may not be returned until the following business day.  We are closed weekends and major holidays.  You do have access to a nurse 24-7, just call the main number to the clinic [phone] and do not press any options, hold on the line and a nurse will answer the phone.    For prescription refill requests, have your pharmacy contact our office and allow 72 hours.

## 2023-05-28 ENCOUNTER — Encounter: Payer: Self-pay | Admitting: *Deleted

## 2023-05-28 ENCOUNTER — Ambulatory Visit: Payer: Self-pay | Admitting: *Deleted

## 2023-05-28 NOTE — Patient Outreach (Signed)
 Care Coordination   Health Equity Plan Follow Up Visit Note   05/28/2023 Name: Wendy Kramer MRN: [id number] DOB: September 17, 1960  Wendy Kramer is a 63 y.o. year old female who sees [name], Benita Area, MD for primary care. I spoke with  Wendy Kramer by phone today.  SDOH assessments and interventions completed:  Yes     Care Coordination Interventions:  Yes, provided   Follow up plan: Follow up call scheduled for 06/17/23   Encounter Outcome:  Patient Visit Completed   Josette Pellet, RN, BSN   [facility], [facility] RN Care Manager Direct Dial: [phone]

## 2023-05-30 DIAGNOSIS — J449 Chronic obstructive pulmonary disease, unspecified: Secondary | ICD-10-CM | POA: Diagnosis not present

## 2023-05-30 DIAGNOSIS — I10 Essential (primary) hypertension: Secondary | ICD-10-CM | POA: Diagnosis not present

## 2023-06-03 ENCOUNTER — Inpatient Hospital Stay: Payer: Medicare Other | Attending: Hematology

## 2023-06-03 VITALS — BP 111/41 | HR 87 | Temp 97.2°F | Resp 18

## 2023-06-03 DIAGNOSIS — Z79899 Other long term (current) drug therapy: Secondary | ICD-10-CM | POA: Diagnosis not present

## 2023-06-03 DIAGNOSIS — D500 Iron deficiency anemia secondary to blood loss (chronic): Secondary | ICD-10-CM | POA: Insufficient documentation

## 2023-06-03 DIAGNOSIS — K31811 Angiodysplasia of stomach and duodenum with bleeding: Secondary | ICD-10-CM | POA: Insufficient documentation

## 2023-06-03 MED ORDER — SODIUM CHLORIDE 0.9 % IV SOLN: Freq: Once | INTRAVENOUS | Status: AC

## 2023-06-03 MED ORDER — ONDANSETRON HCL 4 MG/2ML IJ SOLN
4.0000 mg | Freq: Once | INTRAMUSCULAR | Status: AC
  Administered 2023-06-03: 4 mg via INTRAVENOUS

## 2023-06-03 MED ORDER — METHYLPREDNISOLONE SODIUM SUCC 125 MG IJ SOLR
125.0000 mg | Freq: Once | INTRAMUSCULAR | Status: AC | PRN
  Administered 2023-06-03: 125 mg via INTRAVENOUS

## 2023-06-03 MED ORDER — CETIRIZINE HCL 10 MG/ML IV SOLN
10.0000 mg | Freq: Once | INTRAVENOUS | Status: AC
  Administered 2023-06-03: 10 mg via INTRAVENOUS
  Filled 2023-06-03: qty 1

## 2023-06-03 MED ORDER — ACETAMINOPHEN 325 MG PO TABS
650.0000 mg | ORAL_TABLET | Freq: Once | ORAL | Status: AC
  Administered 2023-06-03: 650 mg via ORAL
  Filled 2023-06-03: qty 2

## 2023-06-03 MED ORDER — SODIUM CHLORIDE 0.9 % IV SOLN
1000.0000 mg | Freq: Once | INTRAVENOUS | Status: AC
  Administered 2023-06-03: 1000 mg via INTRAVENOUS
  Filled 2023-06-03: qty 1000

## 2023-06-03 MED ORDER — FAMOTIDINE IN NACL 20-0.9 MG/50ML-% IV SOLN
20.0000 mg | Freq: Once | INTRAVENOUS | Status: AC
  Administered 2023-06-03: 20 mg via INTRAVENOUS
  Filled 2023-06-03: qty 50

## 2023-06-03 NOTE — Progress Notes (Signed)
 Patient experienced severe side effects of IV Monoferric during infusion, including shortness of breath, chest pressure, back pain, joint pain, nausea with dry heaving, anxiety, and facial flushing.  She had received oral cetirizine, acetaminophen, and IV Pepcid prior to the infusion.  Iron infusion was stopped and she was given IV Solu-Medrol and IV Zofran.  Symptoms resolved within 20 minutes, and patient was back to baseline.  Of note, she did have some wheezing on exam during episode and also following episode.  She reports that she has been wheezing lately due to her underlying "breathing issues," and has a breathing treatment that she usually takes at home in the mornings, but had not taken today.  Given her ongoing iron deficiency anemia, we will need to reschedule her for additional IV iron.  We have 2 options for this: Try another dose of IV Monoferric with additional premeds to include IV cetirizine, IV Pepcid, IV Solu-Medrol, IV Zofran, and acetaminophen. Try alternative form of iron such as Feraheme or Venofer, with premeds as above.  Patient does not feel like deciding when to reschedule IV iron at today's visit, so we will touch base with her next week.  Carnella Guadalajara, PA-C 06/03/23 11:47 AM

## 2023-06-03 NOTE — Progress Notes (Signed)
 Patient presents today for Monoferric infusion per providers order.  Vital signs WNL.  Patient has no new complaints at this time.  Peripheral IV started and blood return noted pre and post infusion.    Hypersensitivity Reaction note  Date of event: 06/03/23 Time of event: 1102 Generic name of drug involved: Ferric Derisomaltose Name of provider notified of the hypersensitivity reaction: Rojelio Brenner PA Was agent that likely caused hypersensitivity reaction added to Allergies List within EMR? yes Chain of events including reaction signs/symptoms, treatment administered, and outcome (e.g., drug resumed; drug discontinued; sent to Emergency Department; etc.)  1102 patient called out with difficulty breathing. Infusion stopped, saline started at 999. 1103 vitals  BP 222/93 HR 121 O2 93    temp 97.2 Patient complaining of severe back pain, tightness in the chest, and pain in her bilateral knees 1104 Rebekah Pennington PA entered room and accessed the patient 1105 vitals BP 176/64 HR 122 O2 96 1108 125 mg Solumedrol administered per PA instruction 1109 patient dry heaving, 4 mg IV zofran administered per PA instruction 1110 saline KVO per provider 1112 vitals BP 97/31 HR 83 O2 97 1120 vitals BP 111/41 HR 87 O2 97, all sysmptoms resolved. 1135 Rojelio Brenner PA in with patient, explaining to patient that if symptoms come back to go to the emergency department. 1140 Per PA patient okay to discharge home.  Vital signs stable.  Discharge from clinic ambulatory in stable condition.  Alert and oriented X 3.  Follow up with [facility] as scheduled.      Worthy Rancher, RN 06/03/2023 11:23 AM

## 2023-06-03 NOTE — Patient Instructions (Signed)
 [facility] [place] - A DEPT OF [facility]  Discharge Instructions: Thank you for choosing [facility] to provide your oncology and hematology care.  If you have a lab appointment with the Cancer Center - please note that after April 8th, 2024, all labs will be drawn in the cancer center.  You do not have to check in or register with the main entrance as you have in the past but will complete your check-in in the cancer center.  Wear comfortable clothing and clothing appropriate for easy access to any Portacath or PICC line.   We strive to give you quality time with your provider. You may need to reschedule your appointment if you arrive late (15 or more minutes).  Arriving late affects you and other patients whose appointments are after yours.  Also, if you miss three or more appointments without notifying the office, you may be dismissed from the clinic at the provider's discretion.      For prescription refill requests, have your pharmacy contact our office and allow 72 hours for refills to be completed.    Today you received the following chemotherapy and/or immunotherapy agents Monoferric      To help prevent nausea and vomiting after your treatment, we encourage you to take your nausea medication as directed.  BELOW ARE SYMPTOMS THAT SHOULD BE REPORTED IMMEDIATELY: *FEVER GREATER THAN 100.4 F (38 C) OR HIGHER *CHILLS OR SWEATING *NAUSEA AND VOMITING THAT IS NOT CONTROLLED WITH YOUR NAUSEA MEDICATION *UNUSUAL SHORTNESS OF BREATH *UNUSUAL BRUISING OR BLEEDING *URINARY PROBLEMS (pain or burning when urinating, or frequent urination) *BOWEL PROBLEMS (unusual diarrhea, constipation, pain near the anus) TENDERNESS IN MOUTH AND THROAT WITH OR WITHOUT PRESENCE OF ULCERS (sore throat, sores in mouth, or a toothache) UNUSUAL RASH, SWELLING OR PAIN  UNUSUAL VAGINAL DISCHARGE OR ITCHING   Items with * indicate a potential emergency and should be followed  up as soon as possible or go to the Emergency Department if any problems should occur.  Please show the CHEMOTHERAPY ALERT CARD or IMMUNOTHERAPY ALERT CARD at check-in to the Emergency Department and triage nurse.  Should you have questions after your visit or need to cancel or reschedule your appointment, please contact [facility] [place] - A DEPT OF Eligh[facility] [phone]  and follow the prompts.  Office hours are 8:00 a.m. to 4:30 p.m. Monday - Friday. Please note that voicemails left after 4:00 p.m. may not be returned until the following business day.  We are closed weekends and major holidays. You have access to a nurse at all times for urgent questions. Please call the main number to the clinic [phone] and follow the prompts.  For any non-urgent questions, you may also contact your provider using MyChart. We now offer e-Visits for anyone 54 and older to request care online for non-urgent symptoms. For details visit [email or web address].   Also download the MyChart app! Go to the app store, search "MyChart", open the app, select [place], and log in with your MyChart username and password.

## 2023-06-04 NOTE — Progress Notes (Signed)
 24 HYPERSENSITIVITY CALL BACK:  Patient is feeling better today.  She is still having slight joint pain, but other symptoms have resolved.  Patient advised to call the clinic with any further questions or concerns.

## 2023-06-09 ENCOUNTER — Encounter (HOSPITAL_COMMUNITY): Payer: Self-pay

## 2023-06-09 ENCOUNTER — Encounter (HOSPITAL_COMMUNITY)
Admission: RE | Admit: 2023-06-09 | Discharge: 2023-06-09 | Disposition: A | Discharge status: Home / Self Care | Payer: Medicare Other | Source: Ambulatory Visit | Attending: Internal Medicine | Admitting: Internal Medicine

## 2023-06-09 HISTORY — DX: Cardiac arrhythmia, unspecified: I49.9

## 2023-06-11 ENCOUNTER — Encounter: Payer: Self-pay | Admitting: Physician Assistant

## 2023-06-11 ENCOUNTER — Encounter (HOSPITAL_COMMUNITY): Payer: Self-pay | Admitting: Internal Medicine

## 2023-06-11 ENCOUNTER — Ambulatory Visit (HOSPITAL_COMMUNITY): Admitting: Certified Registered"

## 2023-06-11 ENCOUNTER — Other Ambulatory Visit: Payer: Self-pay

## 2023-06-11 ENCOUNTER — Encounter (HOSPITAL_COMMUNITY)
Admission: RE | Disposition: A | Discharge status: Home / Self Care | Payer: Self-pay | Source: Home / Self Care | Attending: Internal Medicine

## 2023-06-11 ENCOUNTER — Ambulatory Visit (HOSPITAL_COMMUNITY)
Admission: RE | Admit: 2023-06-11 | Discharge: 2023-06-11 | Disposition: A | Discharge status: Home / Self Care | Payer: Medicare Other | Attending: Internal Medicine | Admitting: Internal Medicine

## 2023-06-11 DIAGNOSIS — K3189 Other diseases of stomach and duodenum: Secondary | ICD-10-CM

## 2023-06-11 DIAGNOSIS — D649 Anemia, unspecified: Secondary | ICD-10-CM | POA: Insufficient documentation

## 2023-06-11 DIAGNOSIS — E119 Type 2 diabetes mellitus without complications: Secondary | ICD-10-CM | POA: Insufficient documentation

## 2023-06-11 DIAGNOSIS — I10 Essential (primary) hypertension: Secondary | ICD-10-CM | POA: Insufficient documentation

## 2023-06-11 DIAGNOSIS — K31819 Angiodysplasia of stomach and duodenum without bleeding: Secondary | ICD-10-CM | POA: Diagnosis not present

## 2023-06-11 DIAGNOSIS — G4730 Sleep apnea, unspecified: Secondary | ICD-10-CM | POA: Insufficient documentation

## 2023-06-11 DIAGNOSIS — F17210 Nicotine dependence, cigarettes, uncomplicated: Secondary | ICD-10-CM | POA: Insufficient documentation

## 2023-06-11 DIAGNOSIS — Z8673 Personal history of transient ischemic attack (TIA), and cerebral infarction without residual deficits: Secondary | ICD-10-CM | POA: Diagnosis not present

## 2023-06-11 DIAGNOSIS — Z6839 Body mass index (BMI) 39.0-39.9, adult: Secondary | ICD-10-CM | POA: Diagnosis not present

## 2023-06-11 DIAGNOSIS — K766 Portal hypertension: Secondary | ICD-10-CM | POA: Insufficient documentation

## 2023-06-11 DIAGNOSIS — J45909 Unspecified asthma, uncomplicated: Secondary | ICD-10-CM

## 2023-06-11 DIAGNOSIS — Z800 Family history of malignant neoplasm of digestive organs: Secondary | ICD-10-CM | POA: Diagnosis not present

## 2023-06-11 DIAGNOSIS — K219 Gastro-esophageal reflux disease without esophagitis: Secondary | ICD-10-CM | POA: Insufficient documentation

## 2023-06-11 DIAGNOSIS — Z860101 Personal history of adenomatous and serrated colon polyps: Secondary | ICD-10-CM | POA: Diagnosis not present

## 2023-06-11 DIAGNOSIS — E785 Hyperlipidemia, unspecified: Secondary | ICD-10-CM | POA: Diagnosis not present

## 2023-06-11 DIAGNOSIS — F419 Anxiety disorder, unspecified: Secondary | ICD-10-CM | POA: Diagnosis not present

## 2023-06-11 HISTORY — PX: ESOPHAGOGASTRODUODENOSCOPY (EGD) WITH PROPOFOL: SHX5813

## 2023-06-11 HISTORY — PX: HOT HEMOSTASIS: SHX5433

## 2023-06-11 LAB — GLUCOSE, CAPILLARY: Glucose-Capillary: 141 mg/dL — ABNORMAL HIGH (ref 70–99)

## 2023-06-11 SURGERY — ESOPHAGOGASTRODUODENOSCOPY (EGD) WITH PROPOFOL
Anesthesia: General

## 2023-06-11 MED ORDER — PROPOFOL 10 MG/ML IV BOLUS
INTRAVENOUS | Status: DC | PRN
  Administered 2023-06-11: 100 mg via INTRAVENOUS
  Administered 2023-06-11: 50 mg via INTRAVENOUS

## 2023-06-11 MED ORDER — GLUCAGON HCL RDNA (DIAGNOSTIC) 1 MG IJ SOLR
INTRAMUSCULAR | Status: AC
  Filled 2023-06-11: qty 1

## 2023-06-11 MED ORDER — LACTATED RINGERS IV SOLN
INTRAVENOUS | Status: DC | PRN
Start: 2023-06-11 — End: 2023-06-11

## 2023-06-11 MED ORDER — PROPOFOL 1000 MG/100ML IV EMUL
INTRAVENOUS | Status: AC
  Filled 2023-06-11: qty 100

## 2023-06-11 MED ORDER — PHENYLEPHRINE 80 MCG/ML (10ML) SYRINGE FOR IV PUSH (FOR BLOOD PRESSURE SUPPORT)
PREFILLED_SYRINGE | INTRAVENOUS | Status: AC
  Filled 2023-06-11: qty 10

## 2023-06-11 MED ORDER — PROPOFOL 500 MG/50ML IV EMUL
INTRAVENOUS | Status: AC
  Filled 2023-06-11: qty 100

## 2023-06-11 MED ORDER — GLYCOPYRROLATE PF 0.2 MG/ML IJ SOSY
PREFILLED_SYRINGE | INTRAMUSCULAR | Status: AC
Start: 2023-06-11 — End: ?
  Filled 2023-06-11: qty 1

## 2023-06-11 MED ORDER — LIDOCAINE HCL (PF) 2 % IJ SOLN
INTRAMUSCULAR | Status: DC | PRN
  Administered 2023-06-11: 100 mg via INTRADERMAL

## 2023-06-11 MED ORDER — LIDOCAINE HCL (PF) 2 % IJ SOLN
INTRAMUSCULAR | Status: AC
  Filled 2023-06-11: qty 35

## 2023-06-11 MED ORDER — LIDOCAINE HCL (PF) 2 % IJ SOLN
INTRAMUSCULAR | Status: AC
  Filled 2023-06-11: qty 5

## 2023-06-11 NOTE — H&P (Signed)
 @LOGO @   Primary Care Physician:  Benetta Spar, MD Primary Gastroenterologist:  Dr. Jena Gauss  Pre-Procedure History & Physical: HPI:  Wendy Kramer is a 63 y.o. 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  Clinically doing well had a reaction to IV iron recently.  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   Unspecified asthma with (acute) exacerbation    Vitamin D deficiency     Past Surgical History:  Procedure Laterality Date   BIOPSY  03/05/2018   Procedure: BIOPSY;  Surgeon: Corbin Ade, MD;  Location: [facility];  Service: Endoscopy;;  gastric   CHOLECYSTECTOMY     COLONOSCOPY WITH PROPOFOL N/A 03/05/2018   Dr. Jena Gauss: Diverticulosis in the sigmoid and descending colon.  3 colon polyps removed, 2 hyperplastic and one sessile serrated polyp.  Next colonoscopy 3 years.   COLONOSCOPY WITH PROPOFOL N/A 12/27/2022   Procedure: COLONOSCOPY WITH PROPOFOL;  Surgeon: Franky Macho, MD;  Location: [facility];  Service: Endoscopy;  Laterality: N/A;   ESOPHAGOGASTRODUODENOSCOPY (EGD) WITH PROPOFOL N/A 03/05/2018   Dr. Jena Gauss: Erosive reflux esophagitis, small hiatal hernia, erosive gastropathy with reactive gastropathy on biopsy.  No H. pylori.   ESOPHAGOGASTRODUODENOSCOPY (EGD) WITH PROPOFOL N/A 12/27/2022   Procedure: ESOPHAGOGASTRODUODENOSCOPY (EGD) WITH PROPOFOL;  Surgeon: Franky Macho, MD;   Location: [facility];  Service: Endoscopy;  Laterality: N/A;   ESOPHAGOGASTRODUODENOSCOPY (EGD) WITH PROPOFOL N/A 04/08/2023   Procedure: ESOPHAGOGASTRODUODENOSCOPY (EGD) WITH PROPOFOL;  Surgeon: Dolores Frame, MD;  Location: [facility];  Service: Gastroenterology;  Laterality: N/A;   HERNIA REPAIR     umbilical   HOT HEMOSTASIS  12/27/2022   Procedure: HOT HEMOSTASIS (ARGON PLASMA COAGULATION/BICAP);  Surgeon: Franky Macho, MD;  Location: [facility];  Service: Endoscopy;;   POLYPECTOMY  03/05/2018   Procedure: POLYPECTOMY;  Surgeon: Corbin Ade, MD;  Location: [facility];  Service: Endoscopy;;  colon   SHOULDER ARTHROSCOPY Right    TEE WITHOUT CARDIOVERSION  2018   patent foramen ovale   TONSILLECTOMY AND ADENOIDECTOMY     TUBAL LIGATION      Prior to Admission medications   Medication Sig Start Date End Date Taking? Authorizing Provider  acetaminophen (TYLENOL) 500 MG tablet Take 1,000 mg by mouth every 6 (six) hours as needed for moderate pain (pain score 4-6).   Yes [provider]  albuterol (PROVENTIL HFA;VENTOLIN HFA) 108 (90 Base) MCG/ACT inhaler Inhale 1 puff into the lungs every 6 (six) hours as needed for wheezing or shortness of breath.   Yes [provider]  BREO ELLIPTA 200-25 MCG/ACT AEPB Inhale 1 puff into the lungs daily in the afternoon. 12/04/22  Yes [provider]  fluticasone (FLONASE) 50 MCG/ACT nasal spray Place 2  sprays into both nostrils daily as needed for allergies or rhinitis.   Yes [provider]  hydrochlorothiazide (MICROZIDE) 12.5 MG capsule Take 12.5 mg by mouth daily. 05/01/23  Yes [provider]  lisinopril (ZESTRIL) 20 MG tablet Take 10 mg by mouth every evening.   Yes [provider]  metFORMIN (GLUCOPHAGE) 500 MG tablet Take 1 tablet (500 mg total) by mouth 2 (two) times daily with a meal. 01/06/23  Yes Reardon, Freddi Starr, NP  traMADol (ULTRAM) 50 MG tablet Take 100 mg by  mouth every 8 (eight) hours as needed for moderate pain (pain score 4-6). 09/20/16  Yes [provider]  Accu-Chek Softclix Lancets lancets Use as instructed to monitor glucose 1 times daily. 11/06/22   Dani Gobble, NP  Blood Glucose Monitoring Suppl (ACCU-CHEK GUIDE ME) w/Device KIT 1 Piece by Does not apply route as directed. 11/02/19   Roma Kayser, MD  glucose blood (ACCU-CHEK GUIDE) test strip USE TO CHECK BLOOD SUGARS ONCE DAILY 12/09/22   Dani Gobble, NP  Multiple Vitamins-Minerals (ONE DAILY MULTIVIT/IRON-FREE) TABS Take 1 tablet by mouth every evening.    [provider]  pantoprazole (PROTONIX) 40 MG tablet Take 1 tablet (40 mg total) by mouth 2 (two) times daily. 05/08/23 05/07/24  Letta Median, PA-C    Allergies as of 04/08/2023 - Review Complete 04/08/2023  Allergen Reaction Noted   Asa [aspirin] Anaphylaxis 09/24/2016   Augmentin [amoxicillin-pot clavulanate] Nausea And Vomiting and Other (See Comments) 09/24/2016   Levaquin [levofloxacin in d5w] Itching and Other (See Comments) 09/24/2016   Lovenox [enoxaparin sodium] Other (See Comments) 09/24/2016    Family History  Problem Relation Age of Onset   Breast cancer Maternal Aunt    Colon cancer Other        maternal great grandfather   Liver disease Brother        Fatty liver   HIV Brother    Hypertension Mother    Hyperlipidemia Mother    Hypertension Father    Thyroid disease Sister    Pancreatic cancer Sister    Healthy Daughter    Healthy Son    Healthy Son    Heart disease Son     Social History   Socioeconomic History   Marital status: Legally Separated    Spouse name: Not on file   Number of children: Not on file   Years of education: Not on file   Highest education level: Not on file  Occupational History   Not on file  Tobacco Use   Smoking status: Every Day    Current packs/day: 0.50    Average packs/day: 0.5 packs/day for 40.0 years (20.0 ttl pk-yrs)    Types:  Cigarettes   Smokeless tobacco: Never  Vaping Use   Vaping status: Never Used  Substance and Sexual Activity   Alcohol use: No   Drug use: No   Sexual activity: Not Currently    Birth control/protection: Post-menopausal, Surgical    Comment: tubal  Other Topics Concern   Not on file  Social History Narrative   Not on file   Social Drivers of Health   Financial Resource Strain: Low Risk  (01/28/2023)   Overall Financial Resource Strain (CARDIA)    Difficulty of Paying Living Expenses: Not very hard  Food Insecurity: No Food Insecurity (04/10/2023)   Hunger Vital Sign    Worried About Running Out of Food in the Last Year: Never true    Ran Out of  Food in the Last Year: Never true  Recent Concern: Food Insecurity - Food Insecurity Present (03/18/2023)   Hunger Vital Sign    Worried About Running Out of Food in the Last Year: Never true    Ran Out of Food in the Last Year: Sometimes true  Transportation Needs: No Transportation Needs (04/11/2023)   PRAPARE - Administrator, Civil Service (Medical): No    Lack of Transportation (Non-Medical): No  Physical Activity: Insufficiently Active (02/17/2023)   Exercise Vital Sign    Days of Exercise per Week: 3 days    Minutes of Exercise per Session: 30 min  Stress: No Stress Concern Present (06/04/2022)   [employer] of Occupational Health - Occupational Stress Questionnaire    Feeling of Stress : Only a little  Social Connections: Socially Isolated (04/11/2023)   Social Connection and Isolation Panel [NHANES]    Frequency of Communication with Friends and Family: More than three times a week    Frequency of Social Gatherings with Friends and Family: Not on file    Attends Religious Services: Never    Database administrator or Organizations: No    Attends Banker Meetings: Never    Marital Status: Separated  Intimate Partner Violence: Not At Risk (04/10/2023)   Humiliation, Afraid, Rape, and Kick  questionnaire    Fear of Current or Ex-Partner: No    Emotionally Abused: No    Physically Abused: No    Sexually Abused: No    Review of Systems: See HPI, otherwise negative ROS  Physical Exam: BP (!) 166/66   Pulse 94   Temp 98.4 F (36.9 C) (Oral)   Resp 15   Ht 5\' 3"  (1.6 m)   Wt 100.3 kg   LMP 09/06/2016   SpO2 97%   BMI 39.15 kg/m  General:   Alert,  Well-developed, well-nourished, pleasant and cooperative in NAD Neck:  Supple; no masses or thyromegaly. No significant cervical adenopathy. Lungs:  Clear throughout to auscultation.   No wheezes, crackles, or rhonchi. No acute distress. Heart:  Regular rate and rhythm; no murmurs, clicks, rubs,  or gallops. Abdomen: Non-distended, normal bowel sounds.  Soft and nontender without appreciable mass or hepatosplenomegaly.  Impression/Plan: 63 year old lady with cirrhosis and GAVE/bleeding here for follow-up and potential treatment as appropriate. The risks, benefits, limitations, alternatives and imponderables have been reviewed with the patient. Potential for esophageal dilation, biopsy, etc. have also been reviewed.  Questions have been answered. All parties agreeable.      Notice: This dictation was prepared with Dragon dictation along with smaller phrase technology. Any transcriptional errors that result from this process are unintentional and may not be corrected upon review.

## 2023-06-11 NOTE — Discharge Instructions (Addendum)
 EGD Discharge instructions Please read the instructions outlined below and refer to this sheet in the next few weeks. These discharge instructions provide you with general information on caring for yourself after you leave the hospital. Your doctor may also give you specific instructions. While your treatment has been planned according to the most current medical practices available, unavoidable complications occasionally occur. If you have any problems or questions after discharge, please call your doctor. ACTIVITY You may resume your regular activity but move at a slower pace for the next 24 hours.  Take frequent rest periods for the next 24 hours.  Walking will help expel (get rid of) the air and reduce the bloated feeling in your abdomen.  No driving for 24 hours (because of the anesthesia (medicine) used during the test).  You may shower.  Do not sign any important legal documents or operate any machinery for 24 hours (because of the anesthesia used during the test).  NUTRITION Drink plenty of fluids.  You may resume your normal diet.  Begin with a light meal and progress to your normal diet.  Avoid alcoholic beverages for 24 hours or as instructed by your caregiver.  MEDICATIONS You may resume your normal medications unless your caregiver tells you otherwise.  WHAT YOU CAN EXPECT TODAY You may experience abdominal discomfort such as a feeling of fullness or "gas" pains.  FOLLOW-UP Your doctor will discuss the results of your test with you.  SEEK IMMEDIATE MEDICAL ATTENTION IF ANY OF THE FOLLOWING OCCUR: Excessive nausea (feeling sick to your stomach) and/or vomiting.  Severe abdominal pain and distention (swelling).  Trouble swallowing.  Temperature over 101 F (37.8 C).  Rectal bleeding or vomiting of blood.    Abnormal stomach again treated to prevent oozing.  Office visit with [place] in 3 months-Kristen Madison  At patient request, I called Ms. Subrun at [phone]  findings and recommendations

## 2023-06-11 NOTE — Transfer of Care (Signed)
 Immediate Anesthesia Transfer of Care Note  Patient: Wendy Kramer  Procedure(s) Performed: ESOPHAGOGASTRODUODENOSCOPY (EGD) WITH PROPOFOL  Patient Location: Endoscopy Unit  Anesthesia Type:General  Level of Consciousness: awake, alert , and oriented  Airway & Oxygen Therapy: Patient Spontanous Breathing  Post-op Assessment: Report given to RN and Post -op Vital signs reviewed and stable  Post vital signs: Reviewed and stable  Last Vitals:  Vitals Value Taken Time  BP    Temp    Pulse    Resp    SpO2      Last Pain:  Vitals:   06/11/23 0701  TempSrc: Oral  PainSc: 0-No pain         Complications: No notable events documented.

## 2023-06-11 NOTE — Progress Notes (Signed)
 Regarding patient reaction to IV Monoferric on 06/03/2023, she would like to hold off on additional IV iron for the time being.  Per patient's conversation with Cynda Acres RN on 06/10/2023, she reported that she was still having joint pain and generalized malaise.  She stated that she would still be considering further treatment with IV iron, and would call us back if she decides to proceed.  Carnella Guadalajara, PA-C 06/11/23 10:16 AM

## 2023-06-11 NOTE — Anesthesia Preprocedure Evaluation (Signed)
 Anesthesia Evaluation  Patient identified by MRN, date of birth, ID band Patient awake    Reviewed: Allergy & Precautions, H&P , NPO status , Patient's Chart, lab work & pertinent test results, reviewed documented beta blocker date and time   Airway Mallampati: II  TM Distance: >3 FB Neck ROM: full    Dental no notable dental hx. (+) Edentulous Upper, Edentulous Lower   Pulmonary asthma , sleep apnea , Current Smoker and Patient abstained from smoking.   Pulmonary exam normal breath sounds clear to auscultation       Cardiovascular Exercise Tolerance: Good hypertension, + Valvular Problems/Murmurs  Rhythm:regular Rate:Normal     Neuro/Psych  PSYCHIATRIC DISORDERS Anxiety     CVA    GI/Hepatic Neg liver ROS,GERD  ,,  Endo/Other  diabetes    Renal/GU negative Renal ROS  negative genitourinary   Musculoskeletal   Abdominal   Peds  Hematology  (+) Blood dyscrasia, anemia   Anesthesia Other Findings   Reproductive/Obstetrics negative OB ROS                              Anesthesia Physical Anesthesia Plan  ASA: 3  Anesthesia Plan: General   Post-op Pain Management:    Induction:   PONV Risk Score and Plan: Propofol infusion  Airway Management Planned:   Additional Equipment:   Intra-op Plan:   Post-operative Plan:   Informed Consent: I have reviewed the patients History and Physical, chart, labs and discussed the procedure including the risks, benefits and alternatives for the proposed anesthesia with the patient or authorized representative who has indicated his/her understanding and acceptance.     Dental Advisory Given  Plan Discussed with: CRNA  Anesthesia Plan Comments:         Anesthesia Quick Evaluation

## 2023-06-11 NOTE — Op Note (Signed)
 [facility] Patient Name: Wendy Kramer Procedure Date: 06/11/2023 7:23 AM MRN: [id number] Date of Birth: 12-28-1960 Attending MD: Gennette Pac , MD, [id number] CSN: [id number] Age: 63 Admit Type: Outpatient Procedure:                Upper GI endoscopy Indications:              Arteriovenous malformation in the stomach Providers:                Gennette Pac, MD, Nena Polio, RN, Lennice Sites Technician, Technician Referring MD:              Medicines:                Propofol per Anesthesia Complications:            No immediate complications. Estimated Blood Loss:     Estimated blood loss: none. Procedure:                Pre-Anesthesia Assessment:                           - Prior to the procedure, a History and Physical                            was performed, and patient medications and                            allergies were reviewed. The patient's tolerance of                            previous anesthesia was also reviewed. The risks                            and benefits of the procedure and the sedation                            options and risks were discussed with the patient.                            All questions were answered, and informed consent                            was obtained. Prior Anticoagulants: The patient has                            taken no anticoagulant or antiplatelet agents. ASA                            Grade Assessment: III - A patient with severe                            systemic disease. After reviewing the risks and  benefits, the patient was deemed in satisfactory                            condition to undergo the procedure.                           After obtaining informed consent, the endoscope was                            passed under direct vision. Throughout the                            procedure, the patient's blood pressure, pulse, and                             oxygen saturations were monitored continuously. The                            [id number]-[id number] ([id number]) scope was introduced through the                            mouth, and advanced to the second part of duodenum.                            The upper GI endoscopy was accomplished without                            difficulty. The patient tolerated the procedure                            well. Scope In: 7:50:07 AM Scope Out: 7:59:23 AM Total Procedure Duration: 0 hours 9 minutes 16 seconds  Findings:      The examined esophagus was normal.      Mild portal hypertensive gastropathy was found in the entire examined       stomach. 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 No gastric varices. Patent pylorus.      The duodenal bulb and second portion of the duodenum were normal. APC       circular probe used to treat persisting GAVE multiple applications at 20       J each. Impression:               - Normal esophagus.                           - Portal hypertensive gastropathy. GAVE status post                            APC treatment                           - Normal duodenal bulb and second portion of the                            duodenum.                           -  No specimens collected. Moderate Sedation:      Moderate (conscious) sedation was personally administered by an       anesthesia professional. The following parameters were monitored: oxygen       saturation, heart rate, blood pressure, respiratory rate, EKG, adequacy       of pulmonary ventilation, and response to care. Recommendation:           - Patient has a contact number available for                            emergencies. The signs and symptoms of potential                            delayed complications were discussed with the                            patient. Return to normal activities tomorrow.                            Written discharge instructions were provided to  the                            patient.                           - Advance diet as tolerated.                           - Continue present medications.                           - Return to my office in 3 months. Procedure Code(s):        --- Professional ---                           [id number], Esophagogastroduodenoscopy, flexible,                            transoral; diagnostic, including collection of                            specimen(s) by brushing or washing, when performed                            (separate procedure) Diagnosis Code(s):        --- Professional ---                           K76.6, Portal hypertension                           K31.89, Other diseases of stomach and duodenum                           K31.819, Angiodysplasia of stomach and duodenum  without bleeding CPT copyright 2022 American Medical Association. All rights reserved. The codes documented in this report are preliminary and upon coder review may  be revised to meet current compliance requirements. [name]. Price Lachapelle, MD Gennette Pac, MD 06/11/2023 8:14:07 AM This report has been signed electronically. Number of Addenda: 0

## 2023-06-12 ENCOUNTER — Encounter (HOSPITAL_COMMUNITY): Payer: Self-pay | Admitting: Internal Medicine

## 2023-06-13 NOTE — Anesthesia Postprocedure Evaluation (Signed)
 Anesthesia Post Note  Patient: Wendy Kramer  Procedure(s) Performed: ESOPHAGOGASTRODUODENOSCOPY (EGD) WITH PROPOFOL EGD, WITH ARGON PLASMA COAGULATION  Patient location during evaluation: Phase II Anesthesia Type: General Level of consciousness: awake Pain management: pain level controlled Vital Signs Assessment: post-procedure vital signs reviewed and stable Respiratory status: spontaneous breathing and respiratory function stable Cardiovascular status: blood pressure returned to baseline and stable Postop Assessment: no headache and no apparent nausea or vomiting Anesthetic complications: no Comments: Late entry   No notable events documented.   Last Vitals:  Vitals:   06/11/23 0701 06/11/23 0805  BP: (!) 166/66 (!) 118/54  Pulse: 94 75  Resp: 15 18  Temp: 36.9 C 36.6 C  SpO2: 97% 97%    Last Pain:  Vitals:   06/11/23 0805  TempSrc: Oral  PainSc: 0-No pain                 Windell Norfolk

## 2023-06-17 ENCOUNTER — Encounter: Payer: Self-pay | Admitting: *Deleted

## 2023-06-23 ENCOUNTER — Other Ambulatory Visit: Payer: Self-pay

## 2023-06-23 DIAGNOSIS — I872 Venous insufficiency (chronic) (peripheral): Secondary | ICD-10-CM

## 2023-06-27 DIAGNOSIS — I10 Essential (primary) hypertension: Secondary | ICD-10-CM | POA: Diagnosis not present

## 2023-06-27 DIAGNOSIS — J449 Chronic obstructive pulmonary disease, unspecified: Secondary | ICD-10-CM | POA: Diagnosis not present

## 2023-06-30 ENCOUNTER — Other Ambulatory Visit: Payer: Self-pay | Admitting: Gastroenterology

## 2023-06-30 ENCOUNTER — Telehealth: Payer: Self-pay | Admitting: *Deleted

## 2023-06-30 DIAGNOSIS — R5383 Other fatigue: Secondary | ICD-10-CM

## 2023-06-30 DIAGNOSIS — D649 Anemia, unspecified: Secondary | ICD-10-CM

## 2023-06-30 NOTE — Telephone Encounter (Signed)
 Patient left a message that she was to see [place] tomorrow, 07/01/2023. She will need a AIC and she is asking if she can come by in the morning and get it done then come back for her office appointment.  Patient called back and made aware that this will be looking at the last 3 months for an average of how the blood sugars have been running.

## 2023-07-01 ENCOUNTER — Encounter: Payer: Self-pay | Admitting: Nurse Practitioner

## 2023-07-01 ENCOUNTER — Ambulatory Visit (INDEPENDENT_AMBULATORY_CARE_PROVIDER_SITE_OTHER): Payer: Medicare Other | Admitting: Nurse Practitioner

## 2023-07-01 VITALS — BP 128/68 | HR 74 | Ht 63.0 in | Wt 221.6 lb

## 2023-07-01 DIAGNOSIS — E1165 Type 2 diabetes mellitus with hyperglycemia: Secondary | ICD-10-CM

## 2023-07-01 DIAGNOSIS — Z7984 Long term (current) use of oral hypoglycemic drugs: Secondary | ICD-10-CM

## 2023-07-01 DIAGNOSIS — E782 Mixed hyperlipidemia: Secondary | ICD-10-CM

## 2023-07-01 DIAGNOSIS — E559 Vitamin D deficiency, unspecified: Secondary | ICD-10-CM

## 2023-07-01 LAB — POCT GLYCOSYLATED HEMOGLOBIN (HGB A1C): Hemoglobin A1C: 8.3 % — AB (ref 4.0–5.6)

## 2023-07-01 NOTE — Progress Notes (Signed)
 07/01/2023, 4:03 PM   Endocrinology follow-up note  Subjective:    Patient ID: Wendy Kramer, female    DOB: Nov 23, 1960.  Wendy Kramer is being seen in follow-up after she was seen in consultation for management of currently uncontrolled symptomatic diabetes requested by  Benetta Spar, MD.   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   Unspecified asthma with (acute) exacerbation    Vitamin D deficiency     Past Surgical History:  Procedure Laterality Date   BIOPSY  03/05/2018   Procedure: BIOPSY;  Surgeon: Corbin Ade, MD;  [facility]: [facility];  Service: Endoscopy;;  gastric   CHOLECYSTECTOMY     COLONOSCOPY WITH PROPOFOL N/A 03/05/2018   Dr. Jena Gauss: Diverticulosis in the sigmoid and descending colon.  3 colon polyps removed, 2 hyperplastic and one sessile serrated polyp.  Next colonoscopy 3 years.   COLONOSCOPY WITH PROPOFOL N/A 12/27/2022   Procedure: COLONOSCOPY WITH PROPOFOL;  Surgeon: Franky Macho, MD;  Location: [facility];  Service: Endoscopy;  Laterality: N/A;   ESOPHAGOGASTRODUODENOSCOPY (EGD) WITH PROPOFOL N/A 03/05/2018   Dr. Jena Gauss: Erosive reflux esophagitis, small hiatal hernia, erosive gastropathy with reactive gastropathy on biopsy.  No H. pylori.   ESOPHAGOGASTRODUODENOSCOPY (EGD) WITH PROPOFOL N/A 12/27/2022   Procedure: ESOPHAGOGASTRODUODENOSCOPY  (EGD) WITH PROPOFOL;  Surgeon: Franky Macho, MD;  [facility]: [facility];  Service: Endoscopy;  Laterality: N/A;   ESOPHAGOGASTRODUODENOSCOPY (EGD) WITH PROPOFOL N/A 04/08/2023   Procedure: ESOPHAGOGASTRODUODENOSCOPY (EGD) WITH PROPOFOL;  Surgeon: Dolores Frame, MD;  Location: [facility];  Service: Gastroenterology;  Laterality: N/A;   ESOPHAGOGASTRODUODENOSCOPY (EGD) WITH PROPOFOL N/A 06/11/2023   Procedure: ESOPHAGOGASTRODUODENOSCOPY (EGD) WITH PROPOFOL;  Surgeon: Corbin Ade, MD;  Location: [facility];  Service: Endoscopy;  Laterality: N/A;  900am, asa 3   HERNIA REPAIR     umbilical   HOT HEMOSTASIS  12/27/2022   Procedure: HOT HEMOSTASIS (ARGON PLASMA COAGULATION/BICAP);  Surgeon: Franky Macho, MD;  Location: [facility];  Service: Endoscopy;;   HOT HEMOSTASIS  06/11/2023   Procedure: EGD, WITH ARGON PLASMA COAGULATION;  Surgeon: Corbin Ade, MD;  Location: [facility];  Service: Endoscopy;;   POLYPECTOMY  03/05/2018   Procedure: POLYPECTOMY;  Surgeon: Corbin Ade, MD;  [facility]: [facility];  Service: Endoscopy;;  colon   SHOULDER ARTHROSCOPY Right    TEE WITHOUT CARDIOVERSION  2018   patent foramen ovale  TONSILLECTOMY AND ADENOIDECTOMY     TUBAL LIGATION      Social History   Socioeconomic History   Marital status: Legally Separated    Spouse name: Not on file   Number of children: Not on file   Years of education: Not on file   Highest education level: Not on file  Occupational History   Not on file  Tobacco Use   Smoking status: Every Day    Current packs/day: 0.50    Average packs/day: 0.5 packs/day for 40.0 years (20.0 ttl pk-yrs)    Types: Cigarettes   Smokeless tobacco: Never  Vaping Use   Vaping status: Never Used  Substance and Sexual Activity   Alcohol use: No   Drug use: No   Sexual activity: Not Currently    Birth control/protection: Post-menopausal, Surgical    Comment: tubal  Other Topics Concern   Not on file   Social History Narrative   Not on file   Social Drivers of Health   Financial Resource Strain: Low Risk  (06/17/2023)   Overall Financial Resource Strain (CARDIA)    Difficulty of Paying Living Expenses: Not very hard  Food Insecurity: No Food Insecurity (04/10/2023)   Hunger Vital Sign    Worried About Running Out of Food in the Last Year: Never true    Ran Out of Food in the Last Year: Never true  Recent Concern: Food Insecurity - Food Insecurity Present (03/18/2023)   Hunger Vital Sign    Worried About Running Out of Food in the Last Year: Never true    Ran Out of Food in the Last Year: Sometimes true  Transportation Needs: No Transportation Needs (06/17/2023)   PRAPARE - Administrator, Civil Service (Medical): No    Lack of Transportation (Non-Medical): No  Physical Activity: Insufficiently Active (02/17/2023)   Exercise Vital Sign    Days of Exercise per Week: 3 days    Minutes of Exercise per Session: 30 min  Stress: No Stress Concern Present (06/04/2022)   [employer] of Occupational Health - Occupational Stress Questionnaire    Feeling of Stress : Only a little  Social Connections: Socially Isolated (04/11/2023)   Social Connection and Isolation Panel [NHANES]    Frequency of Communication with Friends and Family: More than three times a week    Frequency of Social Gatherings with Friends and Family: Not on file    Attends Religious Services: Never    Database administrator or Organizations: No    Attends Banker Meetings: Never    Marital Status: Separated    Family History  Problem Relation Age of Onset   Breast cancer Maternal Aunt    Colon cancer Other        maternal great grandfather   Liver disease Brother        Fatty liver   HIV Brother    Hypertension Mother    Hyperlipidemia Mother    Hypertension Father    Thyroid disease Sister    Pancreatic cancer Sister    Healthy Daughter    Healthy Son    Healthy Son    Heart  disease Son     Outpatient Encounter Medications as of 07/01/2023  Medication Sig   Accu-Chek Softclix Lancets lancets Use as instructed to monitor glucose 1 times daily.   acetaminophen (TYLENOL) 500 MG tablet Take 1,000 mg by mouth every 6 (six) hours as needed for moderate pain (pain score 4-6).   albuterol (PROVENTIL  HFA;VENTOLIN HFA) 108 (90 Base) MCG/ACT inhaler Inhale 1 puff into the lungs every 6 (six) hours as needed for wheezing or shortness of breath.   Blood Glucose Monitoring Suppl (ACCU-CHEK GUIDE ME) w/Device KIT 1 Piece by Does not apply route as directed.   BREO ELLIPTA 200-25 MCG/ACT AEPB Inhale 1 puff into the lungs daily in the afternoon.   fluticasone (FLONASE) 50 MCG/ACT nasal spray Place 2 sprays into both nostrils daily as needed for allergies or rhinitis.   glucose blood (ACCU-CHEK GUIDE) test strip USE TO CHECK BLOOD SUGARS ONCE DAILY   hydrochlorothiazide (MICROZIDE) 12.5 MG capsule Take 12.5 mg by mouth daily.   lisinopril (ZESTRIL) 20 MG tablet Take 10 mg by mouth every evening.   metFORMIN (GLUCOPHAGE) 500 MG tablet Take 1 tablet (500 mg total) by mouth 2 (two) times daily with a meal.   Multiple Vitamins-Minerals (ONE DAILY MULTIVIT/IRON-FREE) TABS Take 1 tablet by mouth every evening.   pantoprazole (PROTONIX) 40 MG tablet Take 1 tablet (40 mg total) by mouth 2 (two) times daily.   traMADol (ULTRAM) 50 MG tablet Take 100 mg by mouth every 8 (eight) hours as needed for moderate pain (pain score 4-6).   No facility-administered encounter medications on file as of 07/01/2023.    ALLERGIES: Allergies  Allergen Reactions   Asa [Aspirin] Anaphylaxis    Tolerates other NSAIDS   Augmentin [Amoxicillin-Pot Clavulanate] Nausea And Vomiting and Other (See Comments)    Syncope   Ferric Derisomaltose Shortness Of Breath, Nausea Only, Swelling and Other (See Comments)    Chest tightness, back and joint pain    Levaquin [Levofloxacin In D5w] Itching and Other (See  Comments)    "body turned red"   Lovenox [Enoxaparin Sodium] Other (See Comments)    physician instructed not to use ever again. "white veins popped out"   Whole Blood Palpitations and Rash    VACCINATION STATUS: There is no immunization history for the selected administration types on file for this patient.  Diabetes She presents for her follow-up diabetic visit. She has type 2 diabetes mellitus. Onset time: She was diagnosed at approximate age of 55 years, after several years of prediabetes. Her disease course has been improving. There are no hypoglycemic associated symptoms. Pertinent negatives for hypoglycemia include no confusion, headaches, pallor or seizures. Pertinent negatives for diabetes include no chest pain, no fatigue, no polydipsia, no polyphagia and no polyuria. There are no hypoglycemic complications. Symptoms are stable. Diabetic complications include a CVA. Risk factors for coronary artery disease include diabetes mellitus, dyslipidemia, family history, obesity, tobacco exposure, post-menopausal, sedentary lifestyle and hypertension. Current diabetic treatment includes oral agent (monotherapy). She is compliant with treatment some of the time (lowered down to 500 mg po BID herself due to GI SE). Her weight is fluctuating minimally. She is following a generally unhealthy diet. When asked about meal planning, she reported none. She has not had a previous visit with a dietitian. She never participates in exercise. Her home blood glucose trend is fluctuating minimally. Her overall blood glucose range is 140-180 mg/dl. (She presents today with her meter, no logs, showing improved but still above target fasting glycemic profile.  Her POCT A1c today is 8.3%, improving from last visit of 9.4%.  She was hospitalized between visits for sepsis, has had several allergic reactions to blood transfusion and iron infusions.  Analysis of her meter shows 7-day average of 182, 14-day average of 161,  30-day average of 163, 90-day average of 158.) An ACE inhibitor/angiotensin  II receptor blocker is being taken. She does not see a podiatrist.Eye exam is current.  Hyperlipidemia This is a chronic problem. The current episode started more than 1 year ago. Factors aggravating her hyperlipidemia include fatty foods. Pertinent negatives include no chest pain, myalgias or shortness of breath. She is currently on no antihyperlipidemic treatment. Compliance problems include adherence to diet and adherence to exercise.  Risk factors for coronary artery disease include diabetes mellitus, hypertension, obesity, post-menopausal and a sedentary lifestyle.  Hypertension This is a chronic problem. The current episode started more than 1 year ago. The problem has been waxing and waning since onset. The problem is uncontrolled. Pertinent negatives include no chest pain, headaches, palpitations or shortness of breath. There are no associated agents to hypertension. Risk factors for coronary artery disease include dyslipidemia, diabetes mellitus, obesity, smoking/tobacco exposure, sedentary lifestyle, post-menopausal state and family history. Past treatments include ACE inhibitors and diuretics. The current treatment provides moderate improvement. Compliance problems include diet and exercise.  Hypertensive end-organ damage includes CVA. Identifiable causes of hypertension include sleep apnea.    Review of systems  Constitutional: + Minimally fluctuating body weight,  current Body mass index is 39.25 kg/m. , no fatigue, no subjective hyperthermia, no subjective hypothermia Eyes: no blurry vision, no xerophthalmia ENT: no sore throat, no nodules palpated in throat, no dysphagia/odynophagia, no hoarseness Cardiovascular: no chest pain, no shortness of breath, no palpitations, no leg swelling Respiratory: no cough, no shortness of breath Gastrointestinal: no nausea/vomiting/diarrhea Musculoskeletal: no muscle/joint  aches Skin: no rashes, no hyperemia, right inner great toe has knot Neurological: no tremors, no numbness, no tingling, no dizziness Psychiatric: no depression, no anxiety   Objective:    BP 128/68 (BP Location: Left Arm, Patient Position: Sitting, Cuff Size: Large)   Pulse 74   Ht 5\' 3"  (1.6 m)   Wt 221 lb 9.6 oz (100.5 kg)   LMP 09/06/2016   BMI 39.25 kg/m   Wt Readings from Last 3 Encounters:  07/01/23 221 lb 9.6 oz (100.5 kg)  06/11/23 221 lb 0.2 oz (100.3 kg)  06/09/23 221 lb (100.2 kg)   BP Readings from Last 3 Encounters:  07/01/23 128/68  06/11/23 (!) 118/54  06/03/23 (!) 111/41     Physical Exam- Limited  Constitutional:  Body mass index is 39.25 kg/m. , not in acute distress, normal state of mind Eyes:  EOMI, no exophthalmos Musculoskeletal: no gross deformities, strength intact in all four extremities, no gross restriction of joint movements Skin:  no rashes, no hyperemia, right great toe has pea-sized mobile lesion- tender to touch at times, no erythema or edema, does not appear to be pressure related. Neurological: no tremor with outstretched hands   Diabetic Foot Exam - Simple   No data filed     CMP ( most recent) CMP     Component Value Date/Time   NA 136 04/30/2023 0937   NA 139 12/28/2022 0000   K 4.5 04/30/2023 0937   CL 102 04/30/2023 0937   CO2 26 04/30/2023 0937   GLUCOSE 178 (H) 04/30/2023 0937   BUN 10 04/30/2023 0937   BUN 12 02/15/2020 1018   CREATININE 0.66 04/30/2023 0937   CREATININE 0.67 12/25/2022 0759   CALCIUM 9.1 04/30/2023 0937   PROT 7.2 04/30/2023 0937   PROT 7.2 02/15/2020 1018   ALBUMIN 3.5 04/30/2023 0937   ALBUMIN 4.5 02/15/2020 1018   AST 28 04/30/2023 0937   ALT 16 04/30/2023 0937   ALKPHOS 86 04/30/2023 0937  BILITOT 0.8 04/30/2023 0937   BILITOT 0.4 02/15/2020 1018   GFRNONAA >60 04/30/2023 0937   GFRAA 85 02/15/2020 1018     Diabetic Labs (most recent): Lab Results  Component Value Date   HGBA1C  8.3 (A) 07/01/2023   HGBA1C 9.4 (H) 12/26/2022   HGBA1C 10.0 (A) 06/24/2022   MICROALBUR 80 02/09/2020     Lab Results  Component Value Date   TSH 1.663 02/06/2023   TSH 1.470 01/03/2023   TSH 1.56 05/01/2022   TSH 1.85 07/23/2019   FREET4 1.46 01/03/2023   FREET4 1.2 05/01/2022      Assessment & Plan:   1) Uncontrolled type 2 diabetes mellitus with hyperglycemia (HCC)  - Wendy Kramer has currently uncontrolled symptomatic type 2 DM since  63 years of age.  She presents today with her meter, no logs, showing improved but still above target fasting glycemic profile.  Her POCT A1c today is 8.3%, improving from last visit of 9.4%.  She was hospitalized between visits for sepsis, has had several allergic reactions to blood transfusion and iron infusions.  Analysis of her meter shows 7-day average of 182, 14-day average of 161, 30-day average of 163, 90-day average of 158.  - I had a long discussion with her about the progressive nature of diabetes and the pathology behind its complications. -her diabetes is complicated by obesity/sedentary life, active smoking and she remains at a high risk for more acute and chronic complications which include CAD, CVA, CKD, retinopathy, and neuropathy. These are all discussed in detail with her.  The following Lifestyle Medicine recommendations according to American College of Lifestyle Medicine [facility]) were discussed and offered to patient and she agrees to start the journey:  A. Whole Foods, Plant-based plate comprising of fruits and vegetables, plant-based proteins, whole-grain carbohydrates was discussed in detail with the patient.   A list for source of those nutrients were also provided to the patient.  Patient will use only water or unsweetened tea for hydration. B.  The need to stay away from risky substances including alcohol, smoking; obtaining 7 to 9 hours of restorative sleep, at least 150 minutes of moderate intensity exercise weekly, the  importance of healthy social connections,  and stress reduction techniques were discussed. C.  A full color page of  Calorie density of various food groups per pound showing examples of each food groups was provided to the patient.  - Nutritional counseling repeated at each appointment due to patients tendency to fall back in to old habits.  - The patient admits there is a room for improvement in their diet and drink choices. -  Suggestion is made for the patient to avoid simple carbohydrates from their diet including Cakes, Sweet Desserts / Pastries, Ice Cream, Soda (diet and regular), Sweet Tea, Candies, Chips, Cookies, Sweet Pastries, Store Bought Juices, Alcohol in Excess of 1-2 drinks a day, Artificial Sweeteners, Coffee Creamer, and "Sugar-free" Products. This will help patient to have stable blood glucose profile and potentially avoid unintended weight gain.   - I encouraged the patient to switch to unprocessed or minimally processed complex starch and increased protein intake (animal or plant source), fruits, and vegetables.   - Patient is advised to stick to a routine mealtimes to eat 3 meals a day and avoid unnecessary snacks (to snack only to correct hypoglycemia).  - she will be scheduled with Norm Salt, RDN, CDE for diabetes education.  - I have approached her with the following individualized plan to manage  her diabetes and patient agrees:   -Given her improving glycemic profile, no changes will be made to her regimen today.  She is advised to continue Metformin 500 mg po twice daily with meals.   She is encouraged to start monitoring glucose once daily, before breakfast to help her regain control of her diabetes.   - Specific targets for  A1c;  LDL, HDL,  and Triglycerides were discussed with the patient.  2) Blood Pressure /Hypertension:   Her blood pressure is controlled to target.  She is advised to continue medications as prescribed by PCP.  3) Lipids/Hyperlipidemia:   Her recent lipid panel from 12/28/22 shows controlled LDL of 72.  She is not currently on any lipid lowering agents. medications.   4) Weight/Diet:  Her Body mass index is 39.25 kg/m.  -   clearly complicating her diabetes care.   she is  a candidate for weight loss. I discussed with her the fact that loss of 5 - 10% of her  current body weight will have the most impact on her diabetes management.  Exercise, and detailed carbohydrates information provided  -  detailed on discharge instructions.  5) Chronic Care/Health Maintenance: -she is on ACEI medications and is encouraged to initiate and continue to follow up with Ophthalmology, Dentist,  Podiatrist at least yearly or according to recommendations, and advised to  Quit smoking. I have recommended yearly flu vaccine and pneumonia vaccine at least every 5 years; moderate intensity exercise for up to 150 minutes weekly; and  sleep for at least 7 hours a day.  - she is advised to maintain close follow up with Benetta Spar, MD for primary care needs, as well as her other providers for optimal and coordinated care.     I spent  30  minutes in the care of the patient today including review of labs from CMP, Lipids, Thyroid Function, Hematology (current and previous including abstractions from other facilities); face-to-face time discussing  her blood glucose readings/logs, discussing hypoglycemia and hyperglycemia episodes and symptoms, medications doses, her options of short and long term treatment based on the latest standards of care / guidelines;  discussion about incorporating lifestyle medicine;  and documenting the encounter. Risk reduction counseling performed per USPSTF guidelines to reduce obesity and cardiovascular risk factors.     Please refer to Patient Instructions for Blood Glucose Monitoring and Insulin/Medications Dosing Guide"  in media tab for additional information. Please  also refer to " Patient Self Inventory" in the  Media  tab for reviewed elements of pertinent patient history.  Wendy Kramer participated in the discussions, expressed understanding, and voiced agreement with the above plans.  All questions were answered to her satisfaction. she is encouraged to contact clinic should she have any questions or concerns prior to her return visit.   Follow up plan: - Return in about 4 months (around 10/31/2023) for Diabetes F/U with A1c in office, No previsit labs.  Ronny Bacon, [facility] [address] [place], [place] [address] Phone: [phone] Fax: [phone]  07/01/2023, 4:03 PM

## 2023-07-02 DIAGNOSIS — R5383 Other fatigue: Secondary | ICD-10-CM | POA: Diagnosis not present

## 2023-07-02 DIAGNOSIS — D649 Anemia, unspecified: Secondary | ICD-10-CM | POA: Diagnosis not present

## 2023-07-03 ENCOUNTER — Ambulatory Visit (HOSPITAL_COMMUNITY): Payer: Medicare Other

## 2023-07-03 LAB — COMPREHENSIVE METABOLIC PANEL WITH GFR
ALT: 22 IU/L (ref 0–32)
AST: 33 IU/L (ref 0–40)
Albumin: 4.2 g/dL (ref 3.9–4.9)
Alkaline Phosphatase: 123 IU/L — ABNORMAL HIGH (ref 44–121)
BUN/Creatinine Ratio: 16 (ref 12–28)
BUN: 14 mg/dL (ref 8–27)
Bilirubin Total: 0.3 mg/dL (ref 0.0–1.2)
CO2: 23 mmol/L (ref 20–29)
Calcium: 9.7 mg/dL (ref 8.7–10.3)
Chloride: 101 mmol/L (ref 96–106)
Creatinine, Ser: 0.88 mg/dL (ref 0.57–1.00)
Globulin, Total: 2.6 g/dL (ref 1.5–4.5)
Glucose: 166 mg/dL — ABNORMAL HIGH (ref 70–99)
Potassium: 5.6 mmol/L — ABNORMAL HIGH (ref 3.5–5.2)
Sodium: 140 mmol/L (ref 134–144)
Total Protein: 6.8 g/dL (ref 6.0–8.5)
eGFR: 74 mL/min/{1.73_m2} (ref >59)

## 2023-07-03 LAB — CBC
Hematocrit: 37.2 % (ref 34.0–46.6)
Hemoglobin: 11.3 g/dL (ref 11.1–15.9)
MCH: 24.7 pg — ABNORMAL LOW (ref 26.6–33.0)
MCHC: 30.4 g/dL — ABNORMAL LOW (ref 31.5–35.7)
MCV: 81 fL (ref 79–97)
Platelets: 223 10*3/uL (ref 150–450)
RBC: 4.57 x10E6/uL (ref 3.77–5.28)
RDW: 17.1 % — ABNORMAL HIGH (ref 11.7–15.4)
WBC: 6.6 10*3/uL (ref 3.4–10.8)

## 2023-07-03 LAB — IRON,TIBC AND FERRITIN PANEL
Ferritin: 16 ng/mL (ref 15–150)
Iron Saturation: 6 % — CL (ref 15–55)
Iron: 26 ug/dL — ABNORMAL LOW (ref 27–139)
Total Iron Binding Capacity: 436 ug/dL (ref 250–450)
UIBC: 410 ug/dL — ABNORMAL HIGH (ref 118–369)

## 2023-07-03 LAB — B12 AND FOLATE PANEL
Folate: 17.2 ng/mL (ref >3.0)
Vitamin B-12: 796 pg/mL (ref 232–1245)

## 2023-07-03 LAB — MAGNESIUM: Magnesium: 1.9 mg/dL (ref 1.6–2.3)

## 2023-07-08 ENCOUNTER — Ambulatory Visit: Payer: Medicare Other | Admitting: Internal Medicine

## 2023-07-11 ENCOUNTER — Ambulatory Visit: Payer: Medicare Other | Admitting: Internal Medicine

## 2023-07-16 ENCOUNTER — Encounter: Payer: Self-pay | Admitting: *Deleted

## 2023-07-16 ENCOUNTER — Ambulatory Visit

## 2023-07-16 VITALS — BP 152/80 | HR 82 | Ht 63.0 in | Wt 223.1 lb

## 2023-07-16 DIAGNOSIS — F17209 Nicotine dependence, unspecified, with unspecified nicotine-induced disorders: Secondary | ICD-10-CM

## 2023-07-16 DIAGNOSIS — J441 Chronic obstructive pulmonary disease with (acute) exacerbation: Secondary | ICD-10-CM | POA: Diagnosis not present

## 2023-07-16 DIAGNOSIS — L989 Disorder of the skin and subcutaneous tissue, unspecified: Secondary | ICD-10-CM

## 2023-07-16 DIAGNOSIS — F411 Generalized anxiety disorder: Secondary | ICD-10-CM

## 2023-07-16 DIAGNOSIS — E782 Mixed hyperlipidemia: Secondary | ICD-10-CM | POA: Diagnosis not present

## 2023-07-16 DIAGNOSIS — E1165 Type 2 diabetes mellitus with hyperglycemia: Secondary | ICD-10-CM | POA: Diagnosis not present

## 2023-07-16 DIAGNOSIS — J42 Unspecified chronic bronchitis: Secondary | ICD-10-CM | POA: Insufficient documentation

## 2023-07-16 MED ORDER — BUDESONIDE-FORMOTEROL FUMARATE 80-4.5 MCG/ACT IN AERO: 2.0000 | INHALATION_SPRAY | Freq: Two times a day (BID) | RESPIRATORY_TRACT | 11 refills | Status: DC

## 2023-07-16 MED ORDER — ALPRAZOLAM 0.25 MG PO TABS: 0.2500 mg | ORAL_TABLET | Freq: Two times a day (BID) | ORAL | 0 refills | Status: DC | PRN

## 2023-07-16 NOTE — Progress Notes (Unsigned)
 New Patient Office Visit  Subjective    Patient ID: Wendy Kramer, female    DOB: [birth year]  Age: 63 y.o. MRN: [id number]  CC:  Chief Complaint  Patient presents with   Establish Care    Pt states her "Anxiety is ramped up recently"     HPI Elecia Kramer presents to establish care COPD  Outpatient Encounter Medications as of 07/16/2023  Medication Sig   ALPRAZolam (XANAX) 0.25 MG tablet Take 1 tablet (0.25 mg total) by mouth 2 (two) times daily as needed for anxiety.   atorvastatin (LIPITOR) 20 MG tablet Take 20 mg by mouth daily.   budesonide-formoterol (SYMBICORT) 80-4.5 MCG/ACT inhaler Inhale 2 puffs into the lungs 2 (two) times daily.   tiZANidine (ZANAFLEX) 2 MG tablet Take 2 mg by mouth at bedtime.   [DISCONTINUED] clopidogrel (PLAVIX) 75 MG tablet Take 75 mg by mouth daily.   [DISCONTINUED] TRELEGY ELLIPTA 100-62.5-25 MCG/ACT AEPB Inhale 1 puff into the lungs daily.   Accu-Chek Softclix Lancets lancets Use as instructed to monitor glucose 1 times daily.   acetaminophen (TYLENOL) 500 MG tablet Take 1,000 mg by mouth every 6 (six) hours as needed for moderate pain (pain score 4-6).   albuterol (PROVENTIL HFA;VENTOLIN HFA) 108 (90 Base) MCG/ACT inhaler Inhale 1 puff into the lungs every 6 (six) hours as needed for wheezing or shortness of breath.   Blood Glucose Monitoring Suppl (ACCU-CHEK GUIDE ME) w/Device KIT 1 Piece by Does not apply route as directed.   fluticasone (FLONASE) 50 MCG/ACT nasal spray Place 2 sprays into both nostrils daily as needed for allergies or rhinitis.   glucose blood (ACCU-CHEK GUIDE) test strip USE TO CHECK BLOOD SUGARS ONCE DAILY   hydrochlorothiazide (MICROZIDE) 12.5 MG capsule Take 12.5 mg by mouth daily.   lisinopril (ZESTRIL) 20 MG tablet Take 10 mg by mouth every evening.   metFORMIN (GLUCOPHAGE) 500 MG tablet Take 1 tablet (500 mg total) by mouth 2 (two) times daily with a meal.   Multiple Vitamins-Minerals (ONE DAILY MULTIVIT/IRON-FREE) TABS  Take 1 tablet by mouth every evening.   pantoprazole (PROTONIX) 40 MG tablet Take 1 tablet (40 mg total) by mouth 2 (two) times daily.   traMADol (ULTRAM) 50 MG tablet Take 100 mg by mouth every 8 (eight) hours as needed for moderate pain (pain score 4-6).   [DISCONTINUED] BREO ELLIPTA 200-25 MCG/ACT AEPB Inhale 1 puff into the lungs daily in the afternoon.   No facility-administered encounter medications on file as of 07/16/2023.    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   Unspecified asthma with (acute) exacerbation    Vitamin D deficiency     Past Surgical History:  Procedure Laterality Date   BIOPSY  03/05/2018   Procedure: BIOPSY;  Surgeon: Corbin Ade, MD;  Location: [facility];  Service: Endoscopy;;  gastric   CHOLECYSTECTOMY     COLONOSCOPY WITH PROPOFOL N/A 03/05/2018   Dr. Jena Gauss: Diverticulosis in the sigmoid and descending colon.  3 colon polyps removed, 2 hyperplastic and one sessile serrated polyp.  Next colonoscopy 3 years.   COLONOSCOPY WITH PROPOFOL N/A 12/27/2022   Procedure: COLONOSCOPY WITH PROPOFOL;  Surgeon: Franky Macho, MD;  Location: [facility];  Service: Endoscopy;  Laterality: N/A;   ESOPHAGOGASTRODUODENOSCOPY (EGD) WITH PROPOFOL N/A 03/05/2018   Dr. Jena Gauss: Erosive reflux esophagitis, small hiatal hernia, erosive gastropathy with reactive gastropathy on biopsy.  No H. pylori.   ESOPHAGOGASTRODUODENOSCOPY (EGD) WITH PROPOFOL N/A  12/27/2022   Procedure: ESOPHAGOGASTRODUODENOSCOPY (EGD) WITH PROPOFOL;  Surgeon: Franky Macho, MD;  Location: [facility];  Service: Endoscopy;  Laterality: N/A;   ESOPHAGOGASTRODUODENOSCOPY (EGD) WITH PROPOFOL N/A 04/08/2023   Procedure: ESOPHAGOGASTRODUODENOSCOPY (EGD) WITH PROPOFOL;  Surgeon: Dolores Frame, MD;  Location: [facility];  Service: Gastroenterology;  Laterality: N/A;   ESOPHAGOGASTRODUODENOSCOPY (EGD) WITH PROPOFOL N/A 06/11/2023   Procedure: ESOPHAGOGASTRODUODENOSCOPY (EGD) WITH PROPOFOL;  Surgeon: Corbin Ade, MD;  Location: [facility];  Service: Endoscopy;  Laterality: N/A;  900am, asa 3   HERNIA REPAIR     umbilical   HOT HEMOSTASIS  12/27/2022   Procedure: HOT HEMOSTASIS (ARGON PLASMA COAGULATION/BICAP);  Surgeon: Franky Macho, MD;  Location: [facility];  Service: Endoscopy;;   HOT HEMOSTASIS  06/11/2023   Procedure: EGD, WITH ARGON PLASMA COAGULATION;  Surgeon: Corbin Ade, MD;  Location: [facility];  Service: Endoscopy;;   POLYPECTOMY  03/05/2018   Procedure: POLYPECTOMY;  Surgeon: Corbin Ade, MD;  Location: [facility];  Service: Endoscopy;;  colon   SHOULDER ARTHROSCOPY Right    TEE WITHOUT CARDIOVERSION  2018   patent foramen ovale   TONSILLECTOMY AND ADENOIDECTOMY     TUBAL LIGATION      Family History  Problem Relation Age of Onset   Breast cancer Maternal Aunt    Colon cancer Other        maternal great grandfather   Liver disease Brother        Fatty liver   HIV Brother    Hypertension Mother    Hyperlipidemia Mother    Hypertension Father    Thyroid disease Sister    Pancreatic cancer Sister    Healthy Daughter    Healthy Son    Healthy Son    Heart disease Son     Social History   Socioeconomic History   Marital status: Legally Separated    Spouse name: Not on file   Number of children: Not on file   Years of education: Not on file   Highest education level: GED or equivalent  Occupational  History   Not on file  Tobacco Use   Smoking status: Every Day    Current packs/day: 0.50    Average packs/day: 0.5 packs/day for 40.0 years (20.0 ttl pk-yrs)    Types: Cigarettes   Smokeless tobacco: Never  Vaping Use   Vaping status: Never Used  Substance and Sexual Activity   Alcohol use: No   Drug use: No   Sexual activity: Not Currently    Birth control/protection: Post-menopausal, Surgical    Comment: tubal  Other Topics Concern   Not on file  Social History Narrative   Not on file   Social Drivers of Health   Financial Resource Strain: Low Risk  (07/16/2023)   Overall Physicist, medical Strain (  CARDIA)    Difficulty of Paying Living Expenses: Not very hard  Food Insecurity: Patient Declined (07/16/2023)   Hunger Vital Sign    Worried About Running Out of Food in the Last Year: Patient declined    Ran Out of Food in the Last Year: Patient declined  Transportation Needs: No Transportation Needs (07/16/2023)   PRAPARE - Administrator, Civil Service (Medical): No    Lack of Transportation (Non-Medical): No  Physical Activity: Inactive (07/16/2023)   Exercise Vital Sign    Days of Exercise per Week: 0 days    Minutes of Exercise per Session: 30 min  Stress: No Stress Concern Present (07/16/2023)   [employer] of Occupational Health - Occupational Stress Questionnaire    Feeling of Stress : Only a little  Social Connections: Socially Isolated (07/16/2023)   Social Connection and Isolation Panel [NHANES]    Frequency of Communication with Friends and Family: More than three times a week    Frequency of Social Gatherings with Friends and Family: More than three times a week    Attends Religious Services: Never    Database administrator or Organizations: No    Attends Banker Meetings: Never    Marital Status: Separated  Intimate Partner Violence: Not At Risk (04/10/2023)   Humiliation, Afraid, Rape, and Kick questionnaire    Fear of Current or  Ex-Partner: No    Emotionally Abused: No    Physically Abused: No    Sexually Abused: No    ROS      Objective    BP (!) 152/80   Pulse 82   Ht 5\' 3"  (1.6 m)   Wt 223 lb 1.9 oz (101.2 kg)   LMP 09/06/2016   SpO2 95%   BMI 39.52 kg/m   Physical Exam Vitals and nursing note reviewed.  Constitutional:      Appearance: Normal appearance. She is obese.  HENT:     Head: Normocephalic.     Right Ear: Tympanic membrane, ear canal and external ear normal.     Left Ear: Tympanic membrane, ear canal and external ear normal.     Nose: Nose normal.     Mouth/Throat:     Mouth: Mucous membranes are moist.     Pharynx: Oropharynx is clear.  Eyes:     Extraocular Movements: Extraocular movements intact.     Pupils: Pupils are equal, round, and reactive to light.  Cardiovascular:     Rate and Rhythm: Normal rate and regular rhythm.  Pulmonary:     Effort: Pulmonary effort is normal.     Breath sounds: Normal breath sounds.  Neurological:     Mental Status: She is alert and oriented to person, place, and time.  Psychiatric:        Mood and Affect: Mood normal.        Thought Content: Thought content normal.     Last CBC Lab Results  Component Value Date   WBC 6.6 07/02/2023   HGB 11.3 07/02/2023   HCT 37.2 07/02/2023   MCV 81 07/02/2023   MCH 24.7 (L) 07/02/2023   RDW 17.1 (H) 07/02/2023   PLT 223 07/02/2023   Last metabolic panel Lab Results  Component Value Date   GLUCOSE 166 (H) 07/02/2023   NA 140 07/02/2023   K 5.6 (H) 07/02/2023   CL 101 07/02/2023   CO2 23 07/02/2023   BUN 14 07/02/2023   CREATININE 0.88 07/02/2023   EGFR 74 07/02/2023  CALCIUM 9.7 07/02/2023   PHOS 3.6 04/06/2023   PROT 6.8 07/02/2023   ALBUMIN 4.2 07/02/2023   LABGLOB 2.6 07/02/2023   AGRATIO 1.1 01/02/2023   BILITOT 0.3 07/02/2023   ALKPHOS 123 (H) 07/02/2023   AST 33 07/02/2023   ALT 22 07/02/2023   ANIONGAP 8 04/30/2023   Last lipids Lab Results  Component Value Date    CHOL 140 12/28/2022   HDL 49 12/28/2022   LDLCALC 72 12/28/2022   TRIG 107 12/28/2022   Last hemoglobin A1c Lab Results  Component Value Date   HGBA1C 8.3 (A) 07/01/2023   Last vitamin D No results found for: "25OHVITD2", "25OHVITD3", "VD25OH"      Assessment & Plan:   Problem List Items Addressed This Visit       Respiratory   COPD exacerbation (HCC) - Primary   She states that her symptoms were best controlled on Spiriva, but her previous PCP switched her to Trelegy.  She reports having a reaction to this medication.  She was also previously on Breo, but that did not help with her symptoms. Will send in prescription for Spiriva HFA and submit a PA if required by insurance company.      Relevant Medications   budesonide-formoterol (SYMBICORT) 80-4.5 MCG/ACT inhaler   Other Relevant Orders   Ambulatory referral to Dermatology     Endocrine   Uncontrolled type 2 diabetes mellitus with hyperglycemia (HCC)   Recent labs show improvement in A1c, which was 8.3.  Still not at goal but improved since previous labs.  We will plan on rechecking her labs in 3 months.  Continue with current medications and work on healthy low carbohydrate diet and increased physical activity.      Relevant Medications   atorvastatin (LIPITOR) 20 MG tablet   Other Relevant Orders   Ambulatory referral to Podiatry     Other   Generalized anxiety disorder   Agreed to refill alprazolam for as needed use during anxiety/panic attacks. PDMP registry was reviewed and there are no signs of misuse of controlled medications      Relevant Medications   ALPRAZolam (XANAX) 0.25 MG tablet   Tobacco use disorder, continuous   Smoking cessation instruction/counseling given:  counseled patient on the dangers of tobacco use, advised patient to stop smoking, and reviewed strategies to maximize success        No follow-ups on file.   Alison Irvine, FNP

## 2023-07-17 ENCOUNTER — Other Ambulatory Visit (HOSPITAL_COMMUNITY): Payer: Self-pay

## 2023-07-17 ENCOUNTER — Encounter: Payer: Self-pay | Admitting: Hematology

## 2023-07-17 ENCOUNTER — Telehealth: Payer: Self-pay | Admitting: Pharmacy Technician

## 2023-07-17 DIAGNOSIS — F411 Generalized anxiety disorder: Secondary | ICD-10-CM | POA: Insufficient documentation

## 2023-07-17 DIAGNOSIS — F17209 Nicotine dependence, unspecified, with unspecified nicotine-induced disorders: Secondary | ICD-10-CM | POA: Insufficient documentation

## 2023-07-17 NOTE — Assessment & Plan Note (Signed)
 Agreed to refill alprazolam for as needed use during anxiety/panic attacks. PDMP registry was reviewed and there are no signs of misuse of controlled medications

## 2023-07-17 NOTE — Telephone Encounter (Signed)
 PA request has been Started. New Encounter has been or will be created for follow up. For additional info see Pharmacy Prior Auth telephone encounter from 07/17/2023.

## 2023-07-17 NOTE — Assessment & Plan Note (Signed)
 Smoking cessation instruction/counseling given:  counseled patient on the dangers of tobacco use, advised patient to stop smoking, and reviewed strategies to maximize success

## 2023-07-17 NOTE — Telephone Encounter (Signed)
 Pharmacy Patient Advocate Encounter   Received notification from Patient Advice Request messages that prior authorization for Budesonide-Formoterol Fumarate 80-4.5MCG/ACT aerosol is required/requested.   Insurance verification completed.   The patient is insured through [facility] .   Per test claim: PA required; PA submitted to above mentioned insurance via CoverMyMeds Key/confirmation #/EOC [id number] Status is pending

## 2023-07-17 NOTE — Telephone Encounter (Signed)
 Pharmacy Patient Advocate Encounter  Received notification from [facility] that Prior Authorization for Budesonide-Formoterol Fumarate 80-4.5MCG/ACT aerosol has been DENIED.  Full denial letter will be uploaded to the media tab. See denial reason below.   PA #/Case ID/Reference #: [id number]   Covered alternatives include: Advair HFA, Arnuity, Anoro,Bevespi,Breyna,Breztri,Combivent,Incruse

## 2023-07-17 NOTE — Assessment & Plan Note (Signed)
 She states that her symptoms were best controlled on Spiriva, but her previous PCP switched her to Trelegy.  She reports having a reaction to this medication.  She was also previously on Breo, but that did not help with her symptoms. Will send in prescription for Spiriva HFA and submit a PA if required by insurance company.

## 2023-07-17 NOTE — Assessment & Plan Note (Signed)
 Recent labs show improvement in A1c, which was 8.3.  Still not at goal but improved since previous labs.  We will plan on rechecking her labs in 3 months.  Continue with current medications and work on healthy low carbohydrate diet and increased physical activity.

## 2023-07-22 ENCOUNTER — Other Ambulatory Visit: Payer: Self-pay

## 2023-07-22 DIAGNOSIS — J441 Chronic obstructive pulmonary disease with (acute) exacerbation: Secondary | ICD-10-CM

## 2023-07-22 MED ORDER — BEVESPI AEROSPHERE 9-4.8 MCG/ACT IN AERO: 2.0000 | INHALATION_SPRAY | Freq: Every day | RESPIRATORY_TRACT | 11 refills | Status: DC

## 2023-07-22 NOTE — Telephone Encounter (Signed)
Changed to bevespi

## 2023-07-24 ENCOUNTER — Encounter: Payer: Self-pay | Admitting: Radiology

## 2023-07-29 ENCOUNTER — Inpatient Hospital Stay: Payer: Medicare Other | Admitting: Physician Assistant

## 2023-07-29 ENCOUNTER — Inpatient Hospital Stay: Payer: Medicare Other

## 2023-08-02 NOTE — Progress Notes (Unsigned)
 [facility] [address], [place] [address]   CLINIC:  Medical Oncology/Hematology  PCP:  Alison Irvine, FNP [address] [place] [address] [phone]   REASON FOR VISIT:  Follow-up for severe iron  deficiency anemia from blood loss  INTERVAL HISTORY:   Wendy Kramer 63 y.o. female returns for follow-up of severe iron  deficiency anemia from blood loss.  She was last seen by Sheril Dines PA-C on 05/27/2023.    She has not had any interim hospitalizations since her last visit.  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  Most recent EGD (06/11/2023) significant for portal hypertensive gastropathy and GAVE s/p APC treatment.   Most recent IV iron  was with Monoferric  on 06/03/2023, but she was unable to complete infusion due to hypersensitivity reaction.  She experienced shortness of breath, chest pressure, back pain, joint pain, nausea with dry heaving, anxiety, and facial flushing.  Symptoms resolved within 20 minutes of being given IV Solu-Medrol  and IV Zofran .  Patient did continue to have some joint pain and general malaise for about 2 days after iron  infusion, and was hesitant to reschedule additional IV iron  at that time.    At today's visit, she reports good energy.  She has not had any recurrent melena or "coffee ground stool" in the past 2 months.   Her previously noted aching in legs has resolved.  Dyspnea on exertion has improved.  She denies any pica, headaches, lightheadedness, syncope, or chest pain.  She has 75% energy and 100% appetite. She endorses that she is maintaining a stable weight.  ASSESSMENT & PLAN:  1.  Severe iron  deficiency anemia from blood loss: - CBC on 12/26/2022 with Hb-6.5, MCV-68.  Ferritin was 3 and percent saturation 2. - She received half unit of PRBC (September 2024) and developed hives.  Transfusion was abandoned. - Successful PRBC transfusion on 04/06/2023 with premedication. - Colonoscopy  (12/27/2022): Diverticulosis of the left colon, nonbleeding internal and external hemorrhoids. - EGD (12/27/2022): Portal hypertensive gastropathy, GAVE with bleeding.  Treated with APC.  Normal duodenal bulb and second part of duodenum. - Hospitalized from 04/05/2023 through 04/08/2023 due to sepsis secondary to colitis as well as acute on chronic iron  deficiency anemia from blood loss, with Hgb down to 6.2 on 04/06/2023.  She received 1 unit PRBC during hospitalization, as well as IV Venofer  200 mg on 04/07/2023.  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 - Hematology workup (01/02/2023): Notable for severe iron  deficiency.  Hemolytic workup was negative.  Copper  and MMA levels are normal.  SPEP, free light chains and immunofixation were normal. - Most recent IV iron  was with Monoferric  on 06/03/2023 - unable to complete infusion due to HYPERSENSITIVITY REACTION.  (Symptoms = shortness of breath, chest pressure, back pain, joint pain, nausea with dry heaving, anxiety, facial flushing.) Symptoms resolved within 20 minutes of being given IV Solu-Medrol  and IV Zofran .  Patient did continue to have some joint pain and general malaise for about 5 days after iron  infusion, and was hesitant to reschedule additional IV iron  at that time.   - Labs today (08/04/2023): Hgb 9.7/MCV 79.6, ferritin 5, iron  saturation 4% with elevated TIBC 482 - PLAN: Recommend IV iron  with Feraheme x 2.  Due to reaction to Monoferric , we will premedicate with IV cetirizine , IV Pepcid , IV Solu-Medrol , and oral acetaminophen . - Since melena has resolved and Hgb is improving, we will hold off on  weekly CBCs for the time being.  However, patient notified to alert us  if she has any worsening symptoms or any recurrence of melanotic stool. - RTC for labs (CBC/D, BB sample, ferritin, iron /TIBC) + OFFICE visit in 2 months  2.  Social/Family History: - Lives at home with daughter and 3 grandchildren. Retired with  disability, used to be a Energy manager. Tobacco use of 0.5 ppd since age 28.  - No family history of anemia. Maternal aunt had breast cancer. Maternal great-grandfather had colon cancer.   3.  Other PMH - NASH cirrhosis, GAVE with upper GI bleed, hypertension, type 2 diabetes mellitus  PLAN SUMMARY: >> IV Feraheme x2 (with PREMEDS) >> Labs in 2 months = CBC/D, BB sample, ferritin, iron /TIBC >> OFFICE visit in 2 months     REVIEW OF SYSTEMS:   Review of Systems  Constitutional:  Negative for appetite change, chills, diaphoresis, fatigue, fever and unexpected weight change.  HENT:   Negative for lump/mass and nosebleeds.   Eyes:  Negative for eye problems.  Respiratory:  Negative for cough, hemoptysis and shortness of breath.   Cardiovascular:  Negative for chest pain, leg swelling and palpitations.  Gastrointestinal:  Negative for abdominal pain, blood in stool, constipation, diarrhea, nausea and vomiting.  Genitourinary:  Negative for hematuria.   Musculoskeletal:  Positive for arthralgias.  Skin: Negative.   Neurological:  Positive for dizziness. Negative for headaches and light-headedness.  Hematological:  Does not bruise/bleed easily.     PHYSICAL EXAM:  ECOG PERFORMANCE STATUS: 1 - Symptomatic but completely ambulatory  Vitals:   08/04/23 1041  BP: (!) 153/57  Pulse: 80  Resp: 18  Temp: (!) 97.5 F (36.4 C)  SpO2: 99%    Filed Weights   08/04/23 1041  Weight: 225 lb (102.1 kg)    Physical Exam Constitutional:      Appearance: Normal appearance. She is obese.  Cardiovascular:     Heart sounds: Murmur (PFO, per patient) heard.  Pulmonary:     Breath sounds: Normal breath sounds.  Neurological:     General: No focal deficit present.     Mental Status: Mental status is at baseline.  Psychiatric:        Behavior: Behavior normal. Behavior is cooperative.    PAST MEDICAL/SURGICAL HISTORY:  Past Medical History:  Diagnosis Date   Arthritis     Cardiac murmur    Cervical spine disease    Cirrhosis (HCC)    likely secondary to NASH   Diabetes mellitus without complication (HCC)    diet controlled   Dysrhythmia    Exogenous hyperlipidemia    GERD (gastroesophageal reflux disease)    History of kidney stones    HTN (hypertension)    Morbid obesity due to excess calories (HCC)    Other chronic pain    Panic disorder    Patent foramen ovale    TEE 2018   Prediabetes    Sleep apnea    Stroke (HCC)    "mini-stroke" no deficits from this.   Unspecified asthma with (acute) exacerbation    Vitamin D deficiency    Past Surgical History:  Procedure Laterality Date   BIOPSY  03/05/2018   Procedure: BIOPSY;  Surgeon: Suzette Espy, MD;  [facility];  Service: Endoscopy;;  gastric   CHOLECYSTECTOMY     COLONOSCOPY WITH PROPOFOL  N/A 03/05/2018   Dr. Riley Cheadle: Diverticulosis in the sigmoid and descending colon.  3 colon polyps removed, 2 hyperplastic and one  sessile serrated polyp.  Next colonoscopy 3 years.   COLONOSCOPY WITH PROPOFOL  N/A 12/27/2022   Procedure: COLONOSCOPY WITH PROPOFOL ;  Surgeon: Hargis Lias, MD;  [facility];  Service: Endoscopy;  Laterality: N/A;   ESOPHAGOGASTRODUODENOSCOPY (EGD) WITH PROPOFOL  N/A 03/05/2018   Dr. Riley Cheadle: Erosive reflux esophagitis, small hiatal hernia, erosive gastropathy with reactive gastropathy on biopsy.  No H. pylori.   ESOPHAGOGASTRODUODENOSCOPY (EGD) WITH PROPOFOL  N/A 12/27/2022   Procedure: ESOPHAGOGASTRODUODENOSCOPY (EGD) WITH PROPOFOL ;  Surgeon: Hargis Lias, MD;  [facility];  Service: Endoscopy;  Laterality: N/A;   ESOPHAGOGASTRODUODENOSCOPY (EGD) WITH PROPOFOL  N/A 04/08/2023   Procedure: ESOPHAGOGASTRODUODENOSCOPY (EGD) WITH PROPOFOL ;  Surgeon: Urban Garden, MD;  [facility];  Service: Gastroenterology;  Laterality: N/A;   ESOPHAGOGASTRODUODENOSCOPY (EGD) WITH PROPOFOL  N/A 06/11/2023   Procedure:  ESOPHAGOGASTRODUODENOSCOPY (EGD) WITH PROPOFOL ;  Surgeon: Suzette Espy, MD;  [facility];  Service: Endoscopy;  Laterality: N/A;  900am, asa 3   HERNIA REPAIR     umbilical   HOT HEMOSTASIS  12/27/2022   Procedure: HOT HEMOSTASIS (ARGON PLASMA COAGULATION/BICAP);  Surgeon: Hargis Lias, MD;  [facility];  Service: Endoscopy;;   HOT HEMOSTASIS  06/11/2023   Procedure: EGD, WITH ARGON PLASMA COAGULATION;  Surgeon: Suzette Espy, MD;  [facility];  Service: Endoscopy;;   POLYPECTOMY  03/05/2018   Procedure: POLYPECTOMY;  Surgeon: Suzette Espy, MD;  [facility];  Service: Endoscopy;;  colon   SHOULDER ARTHROSCOPY Right    TEE WITHOUT CARDIOVERSION  2018   patent foramen ovale   TONSILLECTOMY AND ADENOIDECTOMY     TUBAL LIGATION      SOCIAL HISTORY:  Social History   Socioeconomic History   Marital status: Legally Separated    Spouse name: Not on file   Number of children: Not on file   Years of education: Not on file   Highest education level: GED or equivalent  Occupational History   Not on file  Tobacco Use   Smoking status: Every Day    Current packs/day: 0.50    Average packs/day: 0.5 packs/day for 40.0 years (20.0 ttl pk-yrs)    Types: Cigarettes   Smokeless tobacco: Never  Vaping Use   Vaping status: Never Used  Substance and Sexual Activity   Alcohol  use: No   Drug use: No   Sexual activity: Not Currently    Birth control/protection: Post-menopausal, Surgical    Comment: tubal  Other Topics Concern   Not on file  Social History Narrative   Not on file   Social Drivers of Health   Financial Resource Strain: Low Risk  (07/16/2023)   Overall Financial Resource Strain (CARDIA)    Difficulty of Paying Living Expenses: Not very hard  Food Insecurity: Patient Declined (07/16/2023)   Hunger Vital Sign    Worried About Running Out of Food in the Last Year: Patient declined    Ran Out of Food in the Last Year:  Patient declined  Transportation Needs: No Transportation Needs (07/16/2023)   PRAPARE - Administrator, Civil Service (Medical): No    Lack of Transportation (Non-Medical): No  Physical Activity: Inactive (07/16/2023)   Exercise Vital Sign    Days of Exercise per Week: 0 days    Minutes of Exercise per Session: 30 min  Stress: No Stress Concern Present (07/16/2023)   [employer] of Occupational Health - Occupational Stress Questionnaire    Feeling of Stress :  Only a little  Social Connections: Socially Isolated (07/16/2023)   Social Connection and Isolation Panel [NHANES]    Frequency of Communication with Friends and Family: More than three times a week    Frequency of Social Gatherings with Friends and Family: More than three times a week    Attends Religious Services: Never    Database administrator or Organizations: No    Attends Banker Meetings: Never    Marital Status: Separated  Intimate Partner Violence: Not At Risk (04/10/2023)   Humiliation, Afraid, Rape, and Kick questionnaire    Fear of Current or Ex-Partner: No    Emotionally Abused: No    Physically Abused: No    Sexually Abused: No    FAMILY HISTORY:  Family History  Problem Relation Age of Onset   Breast cancer Maternal Aunt    Colon cancer Other        maternal great grandfather   Liver disease Brother        Fatty liver   HIV Brother    Hypertension Mother    Hyperlipidemia Mother    Hypertension Father    Thyroid  disease Sister    Pancreatic cancer Sister    Healthy Daughter    Healthy Son    Healthy Son    Heart disease Son     CURRENT MEDICATIONS:  Outpatient Encounter Medications as of 08/04/2023  Medication Sig Note   Accu-Chek Softclix Lancets lancets Use as instructed to monitor glucose 1 times daily.    acetaminophen  (TYLENOL ) 500 MG tablet Take 1,000 mg by mouth every 6 (six) hours as needed for moderate pain (pain score 4-6).    albuterol  (PROVENTIL  HFA;VENTOLIN   HFA) 108 (90 Base) MCG/ACT inhaler Inhale 1 puff into the lungs every 6 (six) hours as needed for wheezing or shortness of breath.    ALPRAZolam  (XANAX ) 0.25 MG tablet Take 1 tablet (0.25 mg total) by mouth 2 (two) times daily as needed for anxiety.    Blood Glucose Monitoring Suppl (ACCU-CHEK GUIDE ME) w/Device KIT 1 Piece by Does not apply route as directed.    fluticasone  (FLONASE ) 50 MCG/ACT nasal spray Place 2 sprays into both nostrils daily as needed for allergies or rhinitis.    glucose blood (ACCU-CHEK GUIDE) test strip USE TO CHECK BLOOD SUGARS ONCE DAILY    Glycopyrrolate -Formoterol  (BEVESPI  AEROSPHERE) 9-4.8 MCG/ACT AERO Inhale 2 puffs into the lungs daily.    hydrochlorothiazide (MICROZIDE) 12.5 MG capsule Take 12.5 mg by mouth daily.    lisinopril (ZESTRIL) 20 MG tablet Take 10 mg by mouth every evening. 04/10/2023: See below, patient was splitting in two as BP was running low. Will speak to provider at next visit. Monitors BP at home.    metFORMIN  (GLUCOPHAGE ) 500 MG tablet Take 1 tablet (500 mg total) by mouth 2 (two) times daily with a meal.    Multiple Vitamins-Minerals (ONE DAILY MULTIVIT/IRON -FREE) TABS Take 1 tablet by mouth every evening.    pantoprazole  (PROTONIX ) 40 MG tablet Take 1 tablet (40 mg total) by mouth 2 (two) times daily.    tiZANidine (ZANAFLEX) 2 MG tablet Take 2 mg by mouth at bedtime.    traMADol  (ULTRAM ) 50 MG tablet Take 100 mg by mouth every 8 (eight) hours as needed for moderate pain (pain score 4-6).    [DISCONTINUED] atorvastatin (LIPITOR) 20 MG tablet Take 20 mg by mouth daily.    No facility-administered encounter medications on file as of 08/04/2023.    ALLERGIES:  Allergies  Allergen Reactions   Asa [Aspirin] Anaphylaxis    Tolerates other NSAIDS   Augmentin [Amoxicillin-Pot Clavulanate] Nausea And Vomiting and Other (See Comments)    Syncope   Ferric Derisomaltose  Shortness Of Breath, Nausea Only, Swelling and Other (See Comments)    Chest  tightness, back and joint pain    Trelegy Ellipta [Fluticasone -Umeclidin-Vilant] Anxiety   Levaquin [Levofloxacin In D5w] Itching and Other (See Comments)    "body turned red"   Lovenox [Enoxaparin Sodium] Other (See Comments)    physician instructed not to use ever again. "white veins popped out"   Whole Blood Palpitations and Rash    LABORATORY DATA:  I have reviewed the labs as listed.  CBC    Component Value Date/Time   WBC 7.1 08/04/2023 0936   RBC 4.11 08/04/2023 0936   HGB 9.7 (L) 08/04/2023 0936   HGB 11.3 07/02/2023 0852   HCT 32.7 (L) 08/04/2023 0936   HCT 37.2 07/02/2023 0852   PLT 189 08/04/2023 0936   PLT 223 07/02/2023 0852   MCV 79.6 (L) 08/04/2023 0936   MCV 81 07/02/2023 0852   MCH 23.6 (L) 08/04/2023 0936   MCHC 29.7 (L) 08/04/2023 0936   RDW 15.8 (H) 08/04/2023 0936   RDW 17.1 (H) 07/02/2023 0852   LYMPHSABS 1.9 08/04/2023 0936   MONOABS 0.5 08/04/2023 0936   EOSABS 0.5 08/04/2023 0936   BASOSABS 0.1 08/04/2023 0936      Latest Ref Rng & Units 07/02/2023    8:52 AM 04/30/2023    9:37 AM 04/07/2023    3:55 AM  CMP  Glucose 70 - 99 mg/dL 409  811  914   BUN 8 - 27 mg/dL 14  10  15    Creatinine 0.57 - 1.00 mg/dL 7.82  9.56  2.13   Sodium 134 - 144 mmol/L 140  136  134   Potassium 3.5 - 5.2 mmol/L 5.6  4.5  3.7   Chloride 96 - 106 mmol/L 101  102  105   CO2 20 - 29 mmol/L 23  26  22    Calcium 8.7 - 10.3 mg/dL 9.7  9.1  8.4   Total Protein 6.0 - 8.5 g/dL 6.8  7.2    Total Bilirubin 0.0 - 1.2 mg/dL 0.3  0.8    Alkaline Phos 44 - 121 IU/L 123  86    AST 0 - 40 IU/L 33  28    ALT 0 - 32 IU/L 22  16      DIAGNOSTIC IMAGING:  I have independently reviewed the relevant imaging and discussed with the patient.   WRAP UP:  All questions were answered. The patient knows to call the clinic with any problems, questions or concerns.  Medical decision making: Moderate  Time spent on visit: I spent 20 minutes counseling the patient face to face. The total time  spent in the appointment was 30 minutes and more than 50% was on counseling.  Sonnie Dusky, PA-C  08/04/23 11:55 AM

## 2023-08-04 ENCOUNTER — Inpatient Hospital Stay (HOSPITAL_BASED_OUTPATIENT_CLINIC_OR_DEPARTMENT_OTHER): Admitting: Physician Assistant

## 2023-08-04 ENCOUNTER — Inpatient Hospital Stay: Attending: Hematology

## 2023-08-04 DIAGNOSIS — K31811 Angiodysplasia of stomach and duodenum with bleeding: Secondary | ICD-10-CM | POA: Insufficient documentation

## 2023-08-04 DIAGNOSIS — Z79899 Other long term (current) drug therapy: Secondary | ICD-10-CM | POA: Diagnosis not present

## 2023-08-04 DIAGNOSIS — F17210 Nicotine dependence, cigarettes, uncomplicated: Secondary | ICD-10-CM | POA: Insufficient documentation

## 2023-08-04 DIAGNOSIS — D500 Iron deficiency anemia secondary to blood loss (chronic): Secondary | ICD-10-CM | POA: Diagnosis not present

## 2023-08-04 LAB — CBC WITH DIFFERENTIAL/PLATELET
Abs Immature Granulocytes: 0.02 10*3/uL (ref 0.00–0.07)
Basophils Absolute: 0.1 10*3/uL (ref 0.0–0.1)
Basophils Relative: 1 %
Eosinophils Absolute: 0.5 10*3/uL (ref 0.0–0.5)
Eosinophils Relative: 7 %
HCT: 32.7 % — ABNORMAL LOW (ref 36.0–46.0)
Hemoglobin: 9.7 g/dL — ABNORMAL LOW (ref 12.0–15.0)
Immature Granulocytes: 0 %
Lymphocytes Relative: 27 %
Lymphs Abs: 1.9 10*3/uL (ref 0.7–4.0)
MCH: 23.6 pg — ABNORMAL LOW (ref 26.0–34.0)
MCHC: 29.7 g/dL — ABNORMAL LOW (ref 30.0–36.0)
MCV: 79.6 fL — ABNORMAL LOW (ref 80.0–100.0)
Monocytes Absolute: 0.5 10*3/uL (ref 0.1–1.0)
Monocytes Relative: 7 %
Neutro Abs: 4.2 10*3/uL (ref 1.7–7.7)
Neutrophils Relative %: 58 %
Platelets: 189 10*3/uL (ref 150–400)
RBC: 4.11 MIL/uL (ref 3.87–5.11)
RDW: 15.8 % — ABNORMAL HIGH (ref 11.5–15.5)
WBC: 7.1 10*3/uL (ref 4.0–10.5)
nRBC: 0 % (ref 0.0–0.2)

## 2023-08-04 LAB — FERRITIN: Ferritin: 5 ng/mL — ABNORMAL LOW (ref 11–307)

## 2023-08-04 LAB — SAMPLE TO BLOOD BANK

## 2023-08-04 LAB — IRON AND TIBC
Iron: 20 ug/dL — ABNORMAL LOW (ref 28–170)
Saturation Ratios: 4 % — ABNORMAL LOW (ref 10.4–31.8)
TIBC: 482 ug/dL — ABNORMAL HIGH (ref 250–450)
UIBC: 462 ug/dL

## 2023-08-06 ENCOUNTER — Ambulatory Visit: Admitting: Podiatrist

## 2023-08-06 ENCOUNTER — Encounter: Payer: Self-pay | Admitting: Internal Medicine

## 2023-08-11 ENCOUNTER — Encounter: Payer: Self-pay | Admitting: *Deleted

## 2023-08-13 ENCOUNTER — Ambulatory Visit: Admitting: Podiatrist

## 2023-08-14 ENCOUNTER — Other Ambulatory Visit: Payer: Self-pay | Admitting: *Deleted

## 2023-08-14 ENCOUNTER — Other Ambulatory Visit: Payer: Self-pay | Admitting: Gastroenterology

## 2023-08-14 DIAGNOSIS — D509 Iron deficiency anemia, unspecified: Secondary | ICD-10-CM

## 2023-08-15 ENCOUNTER — Other Ambulatory Visit: Payer: Self-pay | Admitting: Gastroenterology

## 2023-08-15 DIAGNOSIS — E875 Hyperkalemia: Secondary | ICD-10-CM | POA: Diagnosis not present

## 2023-08-15 DIAGNOSIS — D509 Iron deficiency anemia, unspecified: Secondary | ICD-10-CM

## 2023-08-16 LAB — CBC WITH DIFFERENTIAL/PLATELET
Basophils Absolute: 0.1 10*3/uL (ref 0.0–0.2)
Basos: 1 %
EOS (ABSOLUTE): 0.5 10*3/uL — ABNORMAL HIGH (ref 0.0–0.4)
Eos: 7 %
Hematocrit: 32.3 % — ABNORMAL LOW (ref 34.0–46.6)
Hemoglobin: 9.3 g/dL — ABNORMAL LOW (ref 11.1–15.9)
Immature Grans (Abs): 0 10*3/uL (ref 0.0–0.1)
Immature Granulocytes: 0 %
Lymphocytes Absolute: 1.8 10*3/uL (ref 0.7–3.1)
Lymphs: 27 %
MCH: 22.5 pg — ABNORMAL LOW (ref 26.6–33.0)
MCHC: 28.8 g/dL — ABNORMAL LOW (ref 31.5–35.7)
MCV: 78 fL — ABNORMAL LOW (ref 79–97)
Monocytes Absolute: 0.5 10*3/uL (ref 0.1–0.9)
Monocytes: 8 %
Neutrophils Absolute: 3.8 10*3/uL (ref 1.4–7.0)
Neutrophils: 57 %
Platelets: 195 10*3/uL (ref 150–450)
RBC: 4.13 x10E6/uL (ref 3.77–5.28)
RDW: 15.2 % (ref 11.7–15.4)
WBC: 6.8 10*3/uL (ref 3.4–10.8)

## 2023-08-16 LAB — BASIC METABOLIC PANEL WITH GFR
BUN/Creatinine Ratio: 16 (ref 12–28)
BUN: 13 mg/dL (ref 8–27)
CO2: 21 mmol/L (ref 20–29)
Calcium: 9.3 mg/dL (ref 8.7–10.3)
Chloride: 102 mmol/L (ref 96–106)
Creatinine, Ser: 0.82 mg/dL (ref 0.57–1.00)
Glucose: 165 mg/dL — ABNORMAL HIGH (ref 70–99)
Potassium: 5.2 mmol/L (ref 3.5–5.2)
Sodium: 139 mmol/L (ref 134–144)
eGFR: 80 mL/min/{1.73_m2} (ref >59)

## 2023-08-17 ENCOUNTER — Ambulatory Visit: Payer: Self-pay | Admitting: Gastroenterology

## 2023-08-18 ENCOUNTER — Other Ambulatory Visit: Payer: Self-pay

## 2023-08-18 ENCOUNTER — Inpatient Hospital Stay

## 2023-08-18 ENCOUNTER — Other Ambulatory Visit: Payer: Self-pay | Admitting: *Deleted

## 2023-08-18 DIAGNOSIS — F411 Generalized anxiety disorder: Secondary | ICD-10-CM

## 2023-08-18 MED ORDER — ALPRAZOLAM 0.25 MG PO TABS: 0.2500 mg | ORAL_TABLET | Freq: Two times a day (BID) | ORAL | 0 refills | Status: DC | PRN

## 2023-08-19 ENCOUNTER — Inpatient Hospital Stay

## 2023-08-19 VITALS — BP 144/57 | HR 83 | Temp 97.4°F | Resp 18

## 2023-08-19 DIAGNOSIS — D500 Iron deficiency anemia secondary to blood loss (chronic): Secondary | ICD-10-CM

## 2023-08-19 DIAGNOSIS — K31811 Angiodysplasia of stomach and duodenum with bleeding: Secondary | ICD-10-CM | POA: Diagnosis not present

## 2023-08-19 DIAGNOSIS — Z79899 Other long term (current) drug therapy: Secondary | ICD-10-CM | POA: Diagnosis not present

## 2023-08-19 DIAGNOSIS — F17210 Nicotine dependence, cigarettes, uncomplicated: Secondary | ICD-10-CM | POA: Diagnosis not present

## 2023-08-19 MED ORDER — METHYLPREDNISOLONE SODIUM SUCC 125 MG IJ SOLR
125.0000 mg | Freq: Once | INTRAMUSCULAR | Status: AC
  Administered 2023-08-19: 125 mg via INTRAVENOUS
  Filled 2023-08-19: qty 2

## 2023-08-19 MED ORDER — SODIUM CHLORIDE 0.9 % IV SOLN
510.0000 mg | Freq: Once | INTRAVENOUS | Status: AC
  Administered 2023-08-19: 510 mg via INTRAVENOUS
  Filled 2023-08-19: qty 510

## 2023-08-19 MED ORDER — ACETAMINOPHEN 325 MG PO TABS
650.0000 mg | ORAL_TABLET | Freq: Once | ORAL | Status: AC
  Administered 2023-08-19: 650 mg via ORAL
  Filled 2023-08-19: qty 2

## 2023-08-19 MED ORDER — FAMOTIDINE IN NACL 20-0.9 MG/50ML-% IV SOLN
20.0000 mg | Freq: Once | INTRAVENOUS | Status: AC
  Administered 2023-08-19: 20 mg via INTRAVENOUS
  Filled 2023-08-19: qty 50

## 2023-08-19 MED ORDER — SODIUM CHLORIDE 0.9 % IV SOLN: INTRAVENOUS | Status: DC

## 2023-08-19 MED ORDER — CETIRIZINE HCL 10 MG/ML IV SOLN
10.0000 mg | Freq: Once | INTRAVENOUS | Status: AC
  Administered 2023-08-19: 10 mg via INTRAVENOUS
  Filled 2023-08-19: qty 1

## 2023-08-19 NOTE — Patient Instructions (Signed)
 [facility] - A DEPT OF [facility]  Discharge Instructions: Thank you for choosing [facility] to provide your oncology and hematology care.  If you have a lab appointment with the Cancer Center - please note that after April 8th, 2024, all labs will be drawn in the cancer center.  You do not have to check in or register with the main entrance as you have in the past but will complete your check-in in the cancer center.  Wear comfortable clothing and clothing appropriate for easy access to any Portacath or PICC line.   We strive to give you quality time with your provider. You may need to reschedule your appointment if you arrive late (15 or more minutes).  Arriving late affects you and other patients whose appointments are after yours.  Also, if you miss three or more appointments without notifying the office, you may be dismissed from the clinic at the provider's discretion.      For prescription refill requests, have your pharmacy contact our office and allow 72 hours for refills to be completed.    Today you received the following Feraheme IV iron  infusion.     BELOW ARE SYMPTOMS THAT SHOULD BE REPORTED IMMEDIATELY: *FEVER GREATER THAN 100.4 F (38 C) OR HIGHER *CHILLS OR SWEATING *NAUSEA AND VOMITING THAT IS NOT CONTROLLED WITH YOUR NAUSEA MEDICATION *UNUSUAL SHORTNESS OF BREATH *UNUSUAL BRUISING OR BLEEDING *URINARY PROBLEMS (pain or burning when urinating, or frequent urination) *BOWEL PROBLEMS (unusual diarrhea, constipation, pain near the anus) TENDERNESS IN MOUTH AND THROAT WITH OR WITHOUT PRESENCE OF ULCERS (sore throat, sores in mouth, or a toothache) UNUSUAL RASH, SWELLING OR PAIN  UNUSUAL VAGINAL DISCHARGE OR ITCHING   Items with * indicate a potential emergency and should be followed up as soon as possible or go to the Emergency Department if any problems should occur.  Please show the CHEMOTHERAPY ALERT CARD or IMMUNOTHERAPY  ALERT CARD at check-in to the Emergency Department and triage nurse.  Should you have questions after your visit or need to cancel or reschedule your appointment, please contact [facility] - A DEPT OF [facility] [phone]  and follow the prompts.  Office hours are 8:00 a.m. to 4:30 p.m. Monday - Friday. Please note that voicemails left after 4:00 p.m. may not be returned until the following business day.  We are closed weekends and major holidays. You have access to a nurse at all times for urgent questions. Please call the main number to the clinic [phone] and follow the prompts.  For any non-urgent questions, you may also contact your provider using MyChart. We now offer e-Visits for anyone 18 and older to request care online for non-urgent symptoms. For details visit [email or web address].   Also download the MyChart app! Go to the app store, search "MyChart", open the app, select [facility], and log in with your MyChart username and password.

## 2023-08-19 NOTE — Progress Notes (Signed)
 Left message to schedule a 4 week fu appt

## 2023-08-19 NOTE — Progress Notes (Addendum)
 Patient presents today for iron infusion. Patient is in satisfactory condition with no new complaints voiced.  Vital signs are stable.  We will proceed with infusion per provider orders.    Peripheral IV started with good blood return pre and post infusion.  Feraheme 510 mg given today per MD orders. Tolerated infusion without adverse affects. Vital signs stable. No complaints at this time. Discharged from clinic ambulatory in stable condition. Alert and oriented x 3. F/U with [facility] as scheduled.

## 2023-08-20 ENCOUNTER — Ambulatory Visit: Admitting: Podiatrist

## 2023-08-21 ENCOUNTER — Encounter: Payer: Self-pay | Admitting: *Deleted

## 2023-08-26 ENCOUNTER — Inpatient Hospital Stay

## 2023-08-26 ENCOUNTER — Telehealth: Payer: Self-pay | Admitting: *Deleted

## 2023-08-26 ENCOUNTER — Other Ambulatory Visit: Payer: Self-pay | Admitting: *Deleted

## 2023-08-26 MED ORDER — FLUCONAZOLE 150 MG PO TABS: 150.0000 mg | ORAL_TABLET | Freq: Once | ORAL | 0 refills | Status: AC

## 2023-08-26 MED ORDER — FLUCONAZOLE 100 MG PO TABS: 100.0000 mg | ORAL_TABLET | Freq: Every day | ORAL | 0 refills | Status: DC

## 2023-08-26 NOTE — Telephone Encounter (Signed)
 She called to report that she developed what appears to be a vaginal yeast infection following her last iron  infusion. She received steroids prior to her last infusion, which is a likely cause.  Per Sheril Dines, PAC Diflucan 100 mg x 1 sent to pharmacy.  Patient made aware to take today and see PCP if symptoms persist.  Verbalized understanding.

## 2023-08-27 ENCOUNTER — Inpatient Hospital Stay

## 2023-08-27 VITALS — BP 161/67 | HR 84 | Temp 98.0°F | Resp 20

## 2023-08-27 DIAGNOSIS — F17210 Nicotine dependence, cigarettes, uncomplicated: Secondary | ICD-10-CM | POA: Diagnosis not present

## 2023-08-27 DIAGNOSIS — D500 Iron deficiency anemia secondary to blood loss (chronic): Secondary | ICD-10-CM | POA: Diagnosis not present

## 2023-08-27 DIAGNOSIS — Z79899 Other long term (current) drug therapy: Secondary | ICD-10-CM | POA: Diagnosis not present

## 2023-08-27 DIAGNOSIS — K31811 Angiodysplasia of stomach and duodenum with bleeding: Secondary | ICD-10-CM | POA: Diagnosis not present

## 2023-08-27 MED ORDER — SODIUM CHLORIDE 0.9 % IV SOLN: INTRAVENOUS | Status: DC

## 2023-08-27 MED ORDER — FAMOTIDINE IN NACL 20-0.9 MG/50ML-% IV SOLN
20.0000 mg | Freq: Once | INTRAVENOUS | Status: AC
  Administered 2023-08-27: 20 mg via INTRAVENOUS
  Filled 2023-08-27: qty 50

## 2023-08-27 MED ORDER — METHYLPREDNISOLONE SODIUM SUCC 125 MG IJ SOLR
125.0000 mg | Freq: Once | INTRAMUSCULAR | Status: AC
  Administered 2023-08-27: 125 mg via INTRAVENOUS
  Filled 2023-08-27: qty 2

## 2023-08-27 MED ORDER — CETIRIZINE HCL 10 MG/ML IV SOLN
10.0000 mg | Freq: Once | INTRAVENOUS | Status: AC
  Administered 2023-08-27: 10 mg via INTRAVENOUS
  Filled 2023-08-27: qty 1

## 2023-08-27 MED ORDER — ACETAMINOPHEN 325 MG PO TABS
650.0000 mg | ORAL_TABLET | Freq: Once | ORAL | Status: AC
  Administered 2023-08-27: 650 mg via ORAL
  Filled 2023-08-27: qty 2

## 2023-08-27 MED ORDER — SODIUM CHLORIDE 0.9 % IV SOLN
510.0000 mg | Freq: Once | INTRAVENOUS | Status: AC
  Administered 2023-08-27: 510 mg via INTRAVENOUS
  Filled 2023-08-27: qty 510

## 2023-08-27 NOTE — Progress Notes (Signed)
 Patient presents today for Feraheme infusion per providers order.  Vital signs WNL.  Patient has no new complaints at this time.  Peripheral IV started and blood return noted pre and post infusion.  Stable during infusion without adverse affects.  Vital signs stable.  No complaints at this time.  Discharge from clinic ambulatory in stable condition.  Alert and oriented X 3.  Follow up with [facility] as scheduled.

## 2023-08-28 ENCOUNTER — Ambulatory Visit: Admitting: Vascular Surgery

## 2023-08-28 ENCOUNTER — Ambulatory Visit (HOSPITAL_COMMUNITY)

## 2023-09-02 ENCOUNTER — Telehealth: Payer: Self-pay | Admitting: *Deleted

## 2023-09-02 ENCOUNTER — Encounter: Payer: Self-pay | Admitting: *Deleted

## 2023-09-03 DIAGNOSIS — D649 Anemia, unspecified: Secondary | ICD-10-CM | POA: Diagnosis not present

## 2023-09-03 DIAGNOSIS — K7460 Unspecified cirrhosis of liver: Secondary | ICD-10-CM | POA: Diagnosis not present

## 2023-09-04 ENCOUNTER — Ambulatory Visit: Payer: Self-pay | Admitting: Gastroenterology

## 2023-09-04 LAB — CBC WITH DIFFERENTIAL/PLATELET
Basophils Absolute: 0 10*3/uL (ref 0.0–0.2)
Basos: 1 %
EOS (ABSOLUTE): 0.4 10*3/uL (ref 0.0–0.4)
Eos: 6 %
Hematocrit: 37.9 % (ref 34.0–46.6)
Hemoglobin: 11 g/dL — ABNORMAL LOW (ref 11.1–15.9)
Immature Grans (Abs): 0 10*3/uL (ref 0.0–0.1)
Immature Granulocytes: 0 %
Lymphocytes Absolute: 1.5 10*3/uL (ref 0.7–3.1)
Lymphs: 25 %
MCH: 24.8 pg — ABNORMAL LOW (ref 26.6–33.0)
MCHC: 29 g/dL — ABNORMAL LOW (ref 31.5–35.7)
MCV: 86 fL (ref 79–97)
Monocytes Absolute: 0.5 10*3/uL (ref 0.1–0.9)
Monocytes: 8 %
Neutrophils Absolute: 3.6 10*3/uL (ref 1.4–7.0)
Neutrophils: 60 %
Platelets: 210 10*3/uL (ref 150–450)
RBC: 4.43 x10E6/uL (ref 3.77–5.28)
RDW: 23.2 % — ABNORMAL HIGH (ref 11.7–15.4)
WBC: 6.1 10*3/uL (ref 3.4–10.8)

## 2023-09-08 ENCOUNTER — Encounter: Payer: Self-pay | Admitting: Gastroenterology

## 2023-09-08 ENCOUNTER — Encounter: Payer: Self-pay | Admitting: *Deleted

## 2023-09-08 ENCOUNTER — Ambulatory Visit (INDEPENDENT_AMBULATORY_CARE_PROVIDER_SITE_OTHER): Admitting: Gastroenterology

## 2023-09-08 VITALS — BP 143/84 | HR 80 | Temp 97.6°F | Ht 63.0 in | Wt 225.6 lb

## 2023-09-08 DIAGNOSIS — K219 Gastro-esophageal reflux disease without esophagitis: Secondary | ICD-10-CM

## 2023-09-08 DIAGNOSIS — D509 Iron deficiency anemia, unspecified: Secondary | ICD-10-CM

## 2023-09-08 DIAGNOSIS — K7460 Unspecified cirrhosis of liver: Secondary | ICD-10-CM | POA: Diagnosis not present

## 2023-09-08 DIAGNOSIS — K3189 Other diseases of stomach and duodenum: Secondary | ICD-10-CM | POA: Diagnosis not present

## 2023-09-08 DIAGNOSIS — K31819 Angiodysplasia of stomach and duodenum without bleeding: Secondary | ICD-10-CM

## 2023-09-08 DIAGNOSIS — K7581 Nonalcoholic steatohepatitis (NASH): Secondary | ICD-10-CM | POA: Diagnosis not present

## 2023-09-08 DIAGNOSIS — D500 Iron deficiency anemia secondary to blood loss (chronic): Secondary | ICD-10-CM

## 2023-09-08 MED ORDER — PANTOPRAZOLE SODIUM 40 MG PO TBEC: 40.0000 mg | DELAYED_RELEASE_TABLET | Freq: Two times a day (BID) | ORAL | 3 refills | Status: AC

## 2023-09-08 NOTE — Progress Notes (Signed)
 GI Office Note    Referring Provider: Alison Irvine, FNP Primary Care Physician:  Alison Irvine, FNP  Primary Gastroenterologist: Rheba Cedar, MD   Chief Complaint   Chief Complaint  Patient presents with   Follow-up    Doing well, no issues    History of Present Illness   Wendy Kramer is a 63 y.o. female presenting today for follow up. Last seen 04/2023. 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   Most recent admission in 04/2023. For fatigue, diarrhea. Had acute on chronic anemia and focal ascending colititis on CT. Started on empiric antibiotics. Found to have campylobacter.  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 Recommended repeat EGD in four weeks. Hgb 7.4 at discharge.   She had follow up EGD 05/2023 with another APC treatment for GAVE.  Labs 09/03/2023: Hgb 11 (9.3), Hct 37.9  Today: continues to follow with hematology. She has history of hives with blood tranfusion in 2024, had to stop transfusion. She tolerated prbcs with premedication 04/2023. She has had hypersensitivity reaction with monoferric  in 05/2023. Last IV iron  with ferraheme last month, tolerated but premedicated.   Improved energy. Accidentally missed second dose of pantoprazole  few times and had flare of heartburn/reflux. BM regular. No melena, brbpr. No abdominal pain. No n/v.   CT A/P wit contrast 04/2023: IMPRESSION: -Changes of hepatic cirrhosis. -Tiny nonobstructing left renal stone. -Diverticulosis without diverticulitis. -Mild ascending colitis.  EGD 05/2023: -normal esophagus -portal hypertensive gastropaty.  -GAVE s/p APC  EGD 04/08/23: - Normal esophagus.  - Portal hypertensive gastropathy.  - Gastric antral vascular ectasia without bleeding. Treated with argon plasma coagulation ( APC) .  - Normal examined duodenum.   EGD 12/27/22: - Portal hypertensive  gastropathy.  - Gastric antral vascular ectasia with bleeding. Treated with argon plasma coagulation ( APC) . Likely cause of iron  deficiency anemia  -Normal duodenal bulb and second portion of the duodenum.   Colonoscopy 12/27/22: - Diverticulosis in the left colon.  - Medium- sized lipoma in the transverse colon.  - Non- bleeding external and internal hemorrhoids.  - No specimens collected.   - Recommended repeat colonoscopy in 5 years.     Medications   Current Outpatient Medications  Medication Sig Dispense Refill   Accu-Chek Softclix Lancets lancets Use as instructed to monitor glucose 1 times daily. 100 each 5   acetaminophen  (TYLENOL ) 500 MG tablet Take 1,000 mg by mouth every 6 (six) hours as needed for moderate pain (pain score 4-6).     albuterol  (PROVENTIL  HFA;VENTOLIN  HFA) 108 (90 Base) MCG/ACT inhaler Inhale 1 puff into the lungs every 6 (six) hours as needed for wheezing or shortness of breath.     ALPRAZolam  (XANAX ) 0.25 MG tablet Take 1 tablet (0.25 mg total) by mouth 2 (two) times daily as needed for anxiety. 20 tablet 0   Blood Glucose Monitoring Suppl (ACCU-CHEK GUIDE ME) w/Device KIT 1 Piece by Does not apply route as directed. 1 kit 0   fluticasone  (FLONASE ) 50 MCG/ACT nasal spray Place 2 sprays into both nostrils daily as needed for allergies or rhinitis.     glucose blood (ACCU-CHEK GUIDE) test strip USE TO CHECK BLOOD SUGARS ONCE DAILY 100 strip 1   Glycopyrrolate -Formoterol  (BEVESPI  AEROSPHERE) 9-4.8 MCG/ACT AERO Inhale 2 puffs into the lungs daily. (Patient not taking: Reported on 09/08/2023) 10.7 g 11   lisinopril (ZESTRIL) 20 MG tablet Take  10 mg by mouth every evening.     metFORMIN  (GLUCOPHAGE ) 500 MG tablet Take 1 tablet (500 mg total) by mouth 2 (two) times daily with a meal. 180 tablet 3   Multiple Vitamins-Minerals (ONE DAILY MULTIVIT/IRON -FREE) TABS Take 1 tablet by mouth every evening.     pantoprazole  (PROTONIX ) 40 MG tablet Take 1 tablet (40 mg total) by  mouth 2 (two) times daily. 60 tablet 3   tiZANidine (ZANAFLEX) 2 MG tablet Take 2 mg by mouth at bedtime.     traMADol  (ULTRAM ) 50 MG tablet Take 100 mg by mouth every 8 (eight) hours as needed for moderate pain (pain score 4-6).  3   No current facility-administered medications for this visit.    Allergies   Allergies as of 09/08/2023 - Review Complete 09/08/2023  Allergen Reaction Noted   Asa [aspirin] Anaphylaxis 09/24/2016   Augmentin [amoxicillin-pot clavulanate] Nausea And Vomiting and Other (See Comments) 09/24/2016   Ferric derisomaltose  Shortness Of Breath, Nausea Only, Swelling, and Other (See Comments) 06/03/2023   Trelegy ellipta [fluticasone -umeclidin-vilant] Anxiety 07/16/2023   Levaquin [levofloxacin in d5w] Itching and Other (See Comments) 09/24/2016   Lovenox [enoxaparin sodium] Other (See Comments) 09/24/2016   Whole blood Palpitations and Rash 06/09/2023       Review of Systems   General: Negative for anorexia, weight loss, fever, chills, fatigue, weakness. ENT: Negative for hoarseness, difficulty swallowing , nasal congestion. CV: Negative for chest pain, angina, palpitations, dyspnea on exertion, peripheral edema.  Respiratory: Negative for dyspnea at rest, dyspnea on exertion, cough, sputum, wheezing.  GI: See history of present illness. GU:  Negative for dysuria, hematuria, urinary incontinence, urinary frequency, nocturnal urination.  Endo: Negative for unusual weight change.     Physical Exam   BP (!) 158/83 (BP Location: Right Arm, Patient Position: Sitting, Cuff Size: Large)   Pulse 85   Temp 97.6 F (36.4 C) (Oral)   Ht 5\' 3"  (1.6 m)   Wt 225 lb 9.6 oz (102.3 kg)   LMP 09/06/2016   SpO2 94%   BMI 39.96 kg/m    General: Well-nourished, well-developed in no acute distress.  Eyes: No icterus. Mouth: Oropharyngeal mucosa moist and pink   Lungs: Clear to auscultation bilaterally.  Heart: Regular rate and rhythm, no murmurs rubs or gallops.   Abdomen: Bowel sounds are normal, nontender, nondistended, no hepatosplenomegaly or masses,  no abdominal bruits or hernia , no rebound or guarding.  Rectal: not performed  Extremities: No lower extremity edema. No clubbing or deformities.significant varicose veins in lower ext. Neuro: Alert and oriented x 4   Skin: Warm and dry, no jaundice.   Psych: Alert and cooperative, normal mood and affect.  Labs   Lab Results  Component Value Date   NA 139 08/15/2023   CL 102 08/15/2023   K 5.2 08/15/2023   CO2 21 08/15/2023   BUN 13 08/15/2023   CREATININE 0.82 08/15/2023   EGFR 80 08/15/2023   CALCIUM 9.3 08/15/2023   PHOS 3.6 04/06/2023   ALBUMIN 4.2 07/02/2023   GLUCOSE 165 (H) 08/15/2023   Lab Results  Component Value Date   ALT 22 07/02/2023   AST 33 07/02/2023   ALKPHOS 123 (H) 07/02/2023   BILITOT 0.3 07/02/2023   Lab Results  Component Value Date   NA 139 08/15/2023   CL 102 08/15/2023   K 5.2 08/15/2023   CO2 21 08/15/2023   BUN 13 08/15/2023   CREATININE 0.82 08/15/2023   EGFR 80 08/15/2023  CALCIUM 9.3 08/15/2023   PHOS 3.6 04/06/2023   ALBUMIN 4.2 07/02/2023   GLUCOSE 165 (H) 08/15/2023   Lab Results  Component Value Date   INR 1.1 04/08/2023   INR 1.0 12/25/2022   INR 1.0 04/26/2022   Lab Results  Component Value Date   IRON  20 (L) 08/04/2023   TIBC 482 (H) 08/04/2023   FERRITIN 5 (L) 08/04/2023   Lab Results  Component Value Date   WBC 6.1 09/03/2023   HGB 11.0 (L) 09/03/2023   HCT 37.9 09/03/2023   MCV 86 09/03/2023   PLT 210 09/03/2023     Imaging Studies   No results found.  Assessment/Plan:   Anemia: IDA in setting of chronic occult GI bleeding. GAVE/PHG contributing. Most recent Hgb improved at 11. HCT normal.  -monitor for overt GI bleeding or signs of symptomatic anemia -continue to follow with hematology for intermittent iron  infusions -continue PPI BID -avoid NSAIDs  MASH cirrhosis: -has remained well compensated. MELD  3.0 was 10 in 04/2023. No liver lesions on CT 04/2023. She has portal hypertensive gastropathy (PHG).  -due for labs and hepatoma screening, she will go in July when she is due for hematology labs -ruq u/s in July. -return ov in six months.  GERD: requires PPI BID for control of symptoms -continue pantoprazole  40mg  BID before meals -reinforced antireflux measures.  Trudie Fuse. Harles Lied, MHS, PA-C [facility]

## 2023-09-08 NOTE — Patient Instructions (Addendum)
 Continue pantoprazole  40mg  twice daily before breakfast and supper. Please take our lab orders when you go for labs next month with hematology.  Liver ultrasound next month. Monitor for signs of worsening anemia, increased shortness of breath, dizziness, fatigue. Return office visit in six months.

## 2023-09-09 ENCOUNTER — Encounter: Payer: Self-pay | Admitting: *Deleted

## 2023-09-09 NOTE — Telephone Encounter (Signed)
 Letter mailed with appt date and time of US .

## 2023-09-18 ENCOUNTER — Ambulatory Visit (INDEPENDENT_AMBULATORY_CARE_PROVIDER_SITE_OTHER)

## 2023-09-18 DIAGNOSIS — E1165 Type 2 diabetes mellitus with hyperglycemia: Secondary | ICD-10-CM | POA: Diagnosis not present

## 2023-09-18 DIAGNOSIS — E119 Type 2 diabetes mellitus without complications: Secondary | ICD-10-CM

## 2023-09-18 LAB — HM DIABETES EYE EXAM

## 2023-09-18 NOTE — Progress Notes (Signed)
 Arrived 09/18/2023 and has given verbal consent to obtain images and complete their overdue diabetic retinal screening.  The images have been sent to an ophthalmologist or optometrist for review and interpretation.  Results will be sent back to [facility] for review.  Patient has been informed they will be contacted when we receive the results via telephone or MyChart.

## 2023-09-21 ENCOUNTER — Other Ambulatory Visit: Payer: Self-pay | Admitting: Gastroenterology

## 2023-09-22 ENCOUNTER — Telehealth: Payer: Self-pay

## 2023-09-22 NOTE — Progress Notes (Signed)
 Complex Care Management Note Care Guide Note  09/22/2023 Name: Wendy Kramer MRN: [id number] DOB: 1960-04-20   Complex Care Management Outreach Attempts: An unsuccessful telephone outreach was attempted today to offer the patient information about available complex care management services.  Follow Up Plan:  Additional outreach attempts will be made to offer the patient complex care management information and services.   Encounter Outcome:  No Answer  Dreama [facility]  [facility], [facility] Management Assistant Direct Dial: [phone]  Fax: [phone]

## 2023-09-30 NOTE — Progress Notes (Signed)
 Complex Care Management Note Care Guide Note  09/30/2023 Name: Wendy Kramer MRN: [id number] DOB: 12-16-1960   Complex Care Management Outreach Attempts: A second unsuccessful outreach was attempted today to offer the patient with information about available complex care management services.  Follow Up Plan:  No further outreach attempts will be made at this time. We have been unable to contact the patient to offer or enroll patient in complex care management services.  Encounter Outcome:  No Answer  Dreama [facility]  [facility], [facility] Management Assistant Direct Dial: [phone]  Fax: [phone]

## 2023-10-10 ENCOUNTER — Ambulatory Visit (HOSPITAL_COMMUNITY)
Admission: RE | Admit: 2023-10-10 | Discharge: 2023-10-10 | Disposition: A | Discharge status: Home / Self Care | Source: Ambulatory Visit | Attending: Gastroenterology | Admitting: Gastroenterology

## 2023-10-10 DIAGNOSIS — K7460 Unspecified cirrhosis of liver: Secondary | ICD-10-CM | POA: Diagnosis not present

## 2023-10-10 DIAGNOSIS — D500 Iron deficiency anemia secondary to blood loss (chronic): Secondary | ICD-10-CM | POA: Insufficient documentation

## 2023-10-10 DIAGNOSIS — K31819 Angiodysplasia of stomach and duodenum without bleeding: Secondary | ICD-10-CM | POA: Insufficient documentation

## 2023-10-10 DIAGNOSIS — K219 Gastro-esophageal reflux disease without esophagitis: Secondary | ICD-10-CM | POA: Diagnosis not present

## 2023-10-10 DIAGNOSIS — Z9049 Acquired absence of other specified parts of digestive tract: Secondary | ICD-10-CM | POA: Diagnosis not present

## 2023-10-10 DIAGNOSIS — K7689 Other specified diseases of liver: Secondary | ICD-10-CM | POA: Diagnosis not present

## 2023-10-10 DIAGNOSIS — Z1289 Encounter for screening for malignant neoplasm of other sites: Secondary | ICD-10-CM | POA: Diagnosis not present

## 2023-10-15 ENCOUNTER — Other Ambulatory Visit (HOSPITAL_COMMUNITY): Payer: Self-pay

## 2023-10-15 ENCOUNTER — Ambulatory Visit

## 2023-10-15 VITALS — BP 165/79 | HR 86 | Ht 63.0 in | Wt 224.1 lb

## 2023-10-15 DIAGNOSIS — B354 Tinea corporis: Secondary | ICD-10-CM

## 2023-10-15 DIAGNOSIS — J4540 Moderate persistent asthma, uncomplicated: Secondary | ICD-10-CM

## 2023-10-15 MED ORDER — BUDESONIDE-FORMOTEROL FUMARATE 80-4.5 MCG/ACT IN AERO: 2.0000 | INHALATION_SPRAY | Freq: Two times a day (BID) | RESPIRATORY_TRACT | 11 refills | Status: DC

## 2023-10-15 MED ORDER — NYSTATIN 100000 UNIT/GM EX POWD: 1.0000 | Freq: Three times a day (TID) | CUTANEOUS | 5 refills | Status: AC

## 2023-10-15 NOTE — Progress Notes (Signed)
 Established Patient Office Visit  Subjective   Patient ID: Wendy Kramer, female    DOB: 03/11/1961  Age: 63 y.o. MRN: [id number]  Chief Complaint  Patient presents with   Medical Management of Chronic Issues    3 month follow    HPI  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   Acute blood loss anemia 12/26/2022   Acute anemia 12/26/2022   History of colonic polyps 02/21/2021   Cirrhosis of liver without ascites (HCC) 05/23/2020   LUQ pain 11/26/2019   Encounter for gynecological examination with Papanicolaou smear of cervix 10/06/2019   Routine cervical smear 10/06/2019   Encounter for screening fecal occult blood testing 10/06/2019   Uncontrolled type 2 diabetes mellitus with hyperglycemia (HCC) 09/27/2019   Mixed hyperlipidemia 09/27/2019   Morbid obesity (HCC) 09/27/2019   Abnormal LFTs 03/16/2019   Positive colorectal cancer screening using Cologuard test 12/24/2017   Abdominal pain, epigastric 12/24/2017   Gastroesophageal reflux disease 12/24/2017   Fatty liver 12/24/2017    ROS    Objective:     BP (!) 165/79   Pulse 86   Ht 5' 3 (1.6 m)   Wt 224 lb 1.9 oz (101.7 kg)   LMP 09/06/2016   SpO2 92%   BMI 39.70 kg/m  BP Readings from Last 3 Encounters:  10/15/23 (!) 165/79  09/08/23 (!) 143/84  08/27/23 (!) 161/67   Wt Readings from Last 3 Encounters:  10/15/23 224 lb 1.9 oz (101.7 kg)  09/08/23 225 lb 9.6 oz (102.3 kg)  08/04/23 225 lb (102.1 kg)     Physical Exam Vitals and nursing note reviewed.  Constitutional:      Appearance: Normal appearance.  HENT:     Head:  Normocephalic.  Eyes:     Extraocular Movements: Extraocular movements intact.     Pupils: Pupils are equal, round, and reactive to light.  Cardiovascular:     Rate and Rhythm: Normal rate and regular rhythm.  Pulmonary:     Effort: Pulmonary effort is normal.     Breath sounds: Normal breath sounds.  Musculoskeletal:     Cervical back: Normal range of motion and neck supple.  Neurological:     Mental Status: She is alert and oriented to person, place, and time.  Psychiatric:        Mood and Affect: Mood normal.        Thought Content: Thought content normal.     No results found for any visits on 10/15/23.    The ASCVD Risk score (Arnett DK, et al., 2019) failed to calculate for the following reasons:   Risk score cannot be calculated because patient has a medical history suggesting prior/existing ASCVD    Assessment & Plan:   Problem List Items Addressed This Visit       Respiratory   Allergy-induced asthma, moderate persistent, uncomplicated - Primary (Chronic)   Relevant Medications   budesonide -formoterol  (SYMBICORT ) 80-4.5 MCG/ACT inhaler   Other Visit Diagnoses       Tinea of the body       Relevant Medications   nystatin  (MYCOSTATIN /NYSTOP ) powder       Return in about  3 months (around 01/15/2024) for chronic follow-up with PCP.    Leita Longs, FNP

## 2023-10-16 ENCOUNTER — Other Ambulatory Visit: Payer: Self-pay

## 2023-10-16 ENCOUNTER — Other Ambulatory Visit (HOSPITAL_COMMUNITY): Payer: Self-pay

## 2023-10-16 DIAGNOSIS — J441 Chronic obstructive pulmonary disease with (acute) exacerbation: Secondary | ICD-10-CM

## 2023-10-16 MED ORDER — FLUTICASONE FUROATE-VILANTEROL 100-25 MCG/ACT IN AEPB: 1.0000 | INHALATION_SPRAY | Freq: Every day | RESPIRATORY_TRACT | 11 refills | Status: DC

## 2023-10-18 ENCOUNTER — Ambulatory Visit: Payer: Self-pay | Admitting: Gastroenterology

## 2023-10-20 ENCOUNTER — Inpatient Hospital Stay

## 2023-10-20 ENCOUNTER — Inpatient Hospital Stay: Attending: Hematology | Admitting: Oncology

## 2023-10-20 VITALS — BP 142/61 | HR 87 | Temp 98.3°F | Resp 20 | Wt 226.4 lb

## 2023-10-20 DIAGNOSIS — D500 Iron deficiency anemia secondary to blood loss (chronic): Secondary | ICD-10-CM | POA: Diagnosis not present

## 2023-10-20 DIAGNOSIS — K31811 Angiodysplasia of stomach and duodenum with bleeding: Secondary | ICD-10-CM | POA: Diagnosis not present

## 2023-10-20 DIAGNOSIS — K7460 Unspecified cirrhosis of liver: Secondary | ICD-10-CM

## 2023-10-20 LAB — CBC WITH DIFFERENTIAL/PLATELET
Abs Immature Granulocytes: 0.02 K/uL (ref 0.00–0.07)
Basophils Absolute: 0.1 K/uL (ref 0.0–0.1)
Basophils Relative: 1 %
Eosinophils Absolute: 0.5 K/uL (ref 0.0–0.5)
Eosinophils Relative: 6 %
HCT: 34.6 % — ABNORMAL LOW (ref 36.0–46.0)
Hemoglobin: 10.4 g/dL — ABNORMAL LOW (ref 12.0–15.0)
Immature Granulocytes: 0 %
Lymphocytes Relative: 28 %
Lymphs Abs: 2.2 K/uL (ref 0.7–4.0)
MCH: 24.5 pg — ABNORMAL LOW (ref 26.0–34.0)
MCHC: 30.1 g/dL (ref 30.0–36.0)
MCV: 81.4 fL (ref 80.0–100.0)
Monocytes Absolute: 0.5 K/uL (ref 0.1–1.0)
Monocytes Relative: 6 %
Neutro Abs: 4.5 K/uL (ref 1.7–7.7)
Neutrophils Relative %: 59 %
Platelets: 207 K/uL (ref 150–400)
RBC: 4.25 MIL/uL (ref 3.87–5.11)
RDW: 20.1 % — ABNORMAL HIGH (ref 11.5–15.5)
WBC: 7.7 K/uL (ref 4.0–10.5)
nRBC: 0 % (ref 0.0–0.2)

## 2023-10-20 LAB — IRON AND TIBC
Iron: 17 ug/dL — ABNORMAL LOW (ref 28–170)
Saturation Ratios: 4 % — ABNORMAL LOW (ref 10.4–31.8)
TIBC: 453 ug/dL — ABNORMAL HIGH (ref 250–450)
UIBC: 436 ug/dL

## 2023-10-20 LAB — SAMPLE TO BLOOD BANK

## 2023-10-20 LAB — FERRITIN: Ferritin: 5 ng/mL — ABNORMAL LOW (ref 11–307)

## 2023-10-20 NOTE — Assessment & Plan Note (Signed)
 Patient with a history of iron  deficiency anemia likely secondary to chronic blood loss Colonoscopy from 12/27/2022: Diverticulosis of left colon, nonbleeding internal and external hemorrhoids. EGD (12/27/2022): Portal hypertensive gastropathy, GAVE with bleeding.  Treated with APC.  Normal duodenal bulb and second part of duodenum.  S/p IV iron  multiple doses.  Hypersensitivity reactions to Monoferric  and has currently received Feraheme lastly Labs reviewed today showed significant iron  deficiency with TSAT of 4 and ferritin of 5  - Schedule for 2 more doses of IV Feraheme.  Reemphasized side effects including allergic reaction, nausea and vomiting. - Patient is intolerant to oral iron  that causes significant nausea and vomiting. - Continue to follow with GI.  With a history of vascular ectasia, I would think patient will require frequent iron  infusions.  Return to clinic in 3 months with labs

## 2023-10-20 NOTE — Assessment & Plan Note (Addendum)
 Patient has MASH liver cirrhosis.  Likely contributing to anemia Follows with GI Recent ultrasound showed liver cirrhosis  - Continue to follow with GI

## 2023-10-20 NOTE — Progress Notes (Signed)
 [facility]  HEMATOLOGY FOLLOW-UP VISIT  Wendy Doffing, FNP  REASON FOR FOLLOW-UP: Iron  deficiency anemia  ASSESSMENT & PLAN:  Patient is a 63 y.o. female following for iron  deficiency anemia  Assessment & Plan Iron  deficiency anemia due to chronic blood loss Patient with a history of iron  deficiency anemia likely secondary to chronic blood loss Colonoscopy from 12/27/2022: Diverticulosis of left colon, nonbleeding internal and external hemorrhoids. EGD (12/27/2022): Portal hypertensive gastropathy, GAVE with bleeding.  Treated with APC.  Normal duodenal bulb and second part of duodenum.  S/p IV iron  multiple doses.  Hypersensitivity reactions to Monoferric  and has currently received Feraheme lastly Labs reviewed today showed significant iron  deficiency with TSAT of 4 and ferritin of 5  - Schedule for 2 more doses of IV Feraheme.  Reemphasized side effects including allergic reaction, nausea and vomiting. - Patient is intolerant to oral iron  that causes significant nausea and vomiting. - Continue to follow with GI.  With a history of vascular ectasia, I would think patient will require frequent iron  infusions.  Return to clinic in 3 months with labs Cirrhosis of liver without ascites, unspecified hepatic cirrhosis type [facility]) Patient has MASH liver cirrhosis.  Likely contributing to anemia Follows with GI Recent ultrasound showed liver cirrhosis  - Continue to follow with GI    Orders Placed This Encounter  Procedures   Ferritin    Standing Status:   Future    Expected Date:   01/19/2024    Expiration Date:   04/18/2024   Folate    Standing Status:   Future    Expected Date:   01/19/2024    Expiration Date:   04/18/2024   Vitamin B12    Standing Status:   Future    Expected Date:   01/19/2024    Expiration Date:   04/18/2024   CBC with Differential/Platelet    Standing Status:   Future    Expected Date:   01/19/2024    Expiration  Date:   04/18/2024   Comprehensive metabolic panel with GFR    Standing Status:   Future    Expected Date:   01/19/2024    Expiration Date:   04/18/2024   Iron  and TIBC    Standing Status:   Future    Expected Date:   01/19/2024    Expiration Date:   04/18/2024    The total time spent in the appointment was 20 minutes encounter with patients including review of chart and various tests results, discussions about plan of care and coordination of care plan   All questions were answered. The patient knows to call the clinic with any problems, questions or concerns. No barriers to learning was detected.  Wendy Dry, MD 7/21/20253:32 PM   SUMMARY OF HEMATOLOGIC HISTORY: Iron  deficiency anemia likely secondary to vascular ectasia - S/p IV iron  Monoferric  1000 mg on 04/15/2023 and 06/03/2023.  Complicated by transfusion reaction - S/p IV iron  Feraheme 510 mg X2 doses on 08/19/2023 and 08/27/2023   INTERVAL HISTORY: Wendy Kramer 63 y.o. female following for iron  deficiency anemia. She has been feeling energetic recently, although she experienced feeling unwell for two days after being in the heat.  She reports significant vomiting and nausea with the iron  pills and is not taking them.  She has no other complaints today.  She was seen by GI in June last.  I have reviewed the past medical history, past surgical history, social history and family history with  the patient   ALLERGIES:  is allergic to asa [aspirin], augmentin [amoxicillin-pot clavulanate], ferric derisomaltose , trelegy ellipta [fluticasone -umeclidin-vilant], levaquin [levofloxacin in d5w], lovenox [enoxaparin sodium], and whole blood.  MEDICATIONS:  Current Outpatient Medications  Medication Sig Dispense Refill   Accu-Chek Softclix Lancets lancets Use as instructed to monitor glucose 1 times daily. 100 each 5   acetaminophen  (TYLENOL ) 500 MG tablet Take 1,000 mg by mouth every 6 (six) hours as needed for moderate pain (pain score  4-6).     albuterol  (PROVENTIL  HFA;VENTOLIN  HFA) 108 (90 Base) MCG/ACT inhaler Inhale 1 puff into the lungs every 6 (six) hours as needed for wheezing or shortness of breath.     ALPRAZolam  (XANAX ) 0.25 MG tablet Take 1 tablet (0.25 mg total) by mouth 2 (two) times daily as needed for anxiety. 20 tablet 0   Blood Glucose Monitoring Suppl (ACCU-CHEK GUIDE ME) w/Device KIT 1 Piece by Does not apply route as directed. 1 kit 0   fluticasone  (FLONASE ) 50 MCG/ACT nasal spray Place 2 sprays into both nostrils daily as needed for allergies or rhinitis.     fluticasone  furoate-vilanterol (BREO ELLIPTA ) 100-25 MCG/ACT AEPB Inhale 1 puff into the lungs daily. 28 each 11   glucose blood (ACCU-CHEK GUIDE) test strip USE TO CHECK BLOOD SUGARS ONCE DAILY 100 strip 1   lisinopril (ZESTRIL) 20 MG tablet Take 10 mg by mouth every evening.     metFORMIN  (GLUCOPHAGE ) 500 MG tablet Take 1 tablet (500 mg total) by mouth 2 (two) times daily with a meal. 180 tablet 3   nystatin  (MYCOSTATIN /NYSTOP ) powder Apply 1 Application topically 3 (three) times daily. 60 g 5   pantoprazole  (PROTONIX ) 40 MG tablet Take 1 tablet (40 mg total) by mouth 2 (two) times daily before a meal. 180 tablet 3   traMADol  (ULTRAM ) 50 MG tablet Take 100 mg by mouth every 8 (eight) hours as needed for moderate pain (pain score 4-6).  3   No current facility-administered medications for this visit.     REVIEW OF SYSTEMS:   Constitutional: Denies fevers, chills or night sweats Eyes: Denies blurriness of vision Ears, nose, mouth, throat, and face: Denies mucositis or sore throat Respiratory: Denies cough, dyspnea or wheezes Cardiovascular: Denies palpitation, chest discomfort or lower extremity swelling Gastrointestinal:  Denies nausea, heartburn or change in bowel habits Skin: Denies abnormal skin rashes Lymphatics: Denies new lymphadenopathy or easy bruising Neurological:Denies numbness, tingling or new weaknesses Behavioral/Psych: Mood is  stable, no new changes  All other systems were reviewed with the patient and are negative.  PHYSICAL EXAMINATION:   Vitals:   10/20/23 1504 10/20/23 1507  BP: (!) 159/59 (!) 142/61  Pulse: 87   Resp: 20   Temp: 98.3 F (36.8 C)   SpO2: 98%     GENERAL:alert, no distress and comfortable SKIN: skin color, texture, turgor are normal, no rashes or significant lesions LUNGS: clear to auscultation and percussion with normal breathing effort HEART: regular rate & rhythm and no murmurs and no lower extremity edema ABDOMEN:abdomen soft, non-tender and normal bowel sounds Musculoskeletal:no cyanosis of digits and no clubbing  NEURO: alert & oriented x 3 with fluent speech  LABORATORY DATA:  I have reviewed the data as listed  Lab Results  Component Value Date   WBC 7.7 10/20/2023   NEUTROABS 4.5 10/20/2023   HGB 10.4 (L) 10/20/2023   HCT 34.6 (L) 10/20/2023   MCV 81.4 10/20/2023   PLT 207 10/20/2023      Chemistry  Component Value Date/Time   NA 139 08/15/2023 0954   K 5.2 08/15/2023 0954   CL 102 08/15/2023 0954   CO2 21 08/15/2023 0954   BUN 13 08/15/2023 0954   CREATININE 0.82 08/15/2023 0954   CREATININE 0.67 12/25/2022 0759   GLU 176 09/24/2021 0000      Component Value Date/Time   CALCIUM 9.3 08/15/2023 0954   ALKPHOS 123 (H) 07/02/2023 0852   AST 33 07/02/2023 0852   ALT 22 07/02/2023 0852   BILITOT 0.3 07/02/2023 0852      Latest Reference Range & Units 10/20/23 13:55  Iron  28 - 170 ug/dL 17 (L)  UIBC ug/dL 563  TIBC 749 - 549 ug/dL 546 (H)  Saturation Ratios 10.4 - 31.8 % 4 (L)  Ferritin 11 - 307 ng/mL 5 (L)  (L): Data is abnormally low (H): Data is abnormally high   RADIOGRAPHIC STUDIES: I have personally reviewed the radiological images as listed and agreed with the findings in the report.  US  Abdomen Limited RUQ (LIVER/GB) CLINICAL DATA:  cirrhosis, hepatoma screening,  EXAM: ULTRASOUND ABDOMEN LIMITED RIGHT UPPER QUADRANT  COMPARISON:   April 05, 2023, January 12, 2023  FINDINGS: Gallbladder:  Surgically absent  Common bile duct:  Diameter: Visualized portion measures 7 mm, within normal limits.  Liver:  No focal lesion identified. Heterogeneous and coarsened parenchymal echogenicity with nodular liver contours. Portal vein is patent on color Doppler imaging with normal direction of blood flow towards the liver.  Other: None.  IMPRESSION: Cirrhotic liver morphology. No focal lesion identified.  Electronically Signed   By: Corean Salter M.D.   On: 10/11/2023 15:41

## 2023-10-23 DIAGNOSIS — K31819 Angiodysplasia of stomach and duodenum without bleeding: Secondary | ICD-10-CM | POA: Diagnosis not present

## 2023-10-23 DIAGNOSIS — K219 Gastro-esophageal reflux disease without esophagitis: Secondary | ICD-10-CM | POA: Diagnosis not present

## 2023-10-23 DIAGNOSIS — K7460 Unspecified cirrhosis of liver: Secondary | ICD-10-CM | POA: Diagnosis not present

## 2023-10-23 DIAGNOSIS — D500 Iron deficiency anemia secondary to blood loss (chronic): Secondary | ICD-10-CM | POA: Diagnosis not present

## 2023-10-24 LAB — COMPREHENSIVE METABOLIC PANEL WITH GFR
ALT: 12 IU/L (ref 0–32)
AST: 23 IU/L (ref 0–40)
Albumin: 3.9 g/dL (ref 3.9–4.9)
Alkaline Phosphatase: 97 IU/L (ref 44–121)
BUN/Creatinine Ratio: 17 (ref 12–28)
BUN: 14 mg/dL (ref 8–27)
Bilirubin Total: 0.3 mg/dL (ref 0.0–1.2)
CO2: 21 mmol/L (ref 20–29)
Calcium: 9.2 mg/dL (ref 8.7–10.3)
Chloride: 103 mmol/L (ref 96–106)
Creatinine, Ser: 0.83 mg/dL (ref 0.57–1.00)
Globulin, Total: 2.6 g/dL (ref 1.5–4.5)
Glucose: 188 mg/dL — ABNORMAL HIGH (ref 70–99)
Potassium: 4.8 mmol/L (ref 3.5–5.2)
Sodium: 140 mmol/L (ref 134–144)
Total Protein: 6.5 g/dL (ref 6.0–8.5)
eGFR: 79 mL/min/1.73 (ref >59)

## 2023-10-24 LAB — PROTIME-INR
INR: 0.9 (ref 0.9–1.2)
Prothrombin Time: 10.3 s (ref 9.1–12.0)

## 2023-10-24 LAB — AFP TUMOR MARKER: AFP, Serum, Tumor Marker: 2.1 ng/mL (ref 0.0–9.2)

## 2023-10-27 ENCOUNTER — Other Ambulatory Visit: Payer: Self-pay

## 2023-10-27 DIAGNOSIS — I872 Venous insufficiency (chronic) (peripheral): Secondary | ICD-10-CM

## 2023-10-28 ENCOUNTER — Inpatient Hospital Stay

## 2023-10-28 VITALS — BP 166/68 | HR 82 | Temp 98.0°F | Resp 17

## 2023-10-28 DIAGNOSIS — D500 Iron deficiency anemia secondary to blood loss (chronic): Secondary | ICD-10-CM | POA: Diagnosis not present

## 2023-10-28 DIAGNOSIS — K31811 Angiodysplasia of stomach and duodenum with bleeding: Secondary | ICD-10-CM | POA: Diagnosis not present

## 2023-10-28 MED ORDER — SODIUM CHLORIDE 0.9 % IV SOLN: INTRAVENOUS | Status: DC

## 2023-10-28 MED ORDER — FAMOTIDINE IN NACL 20-0.9 MG/50ML-% IV SOLN
20.0000 mg | Freq: Once | INTRAVENOUS | Status: AC
  Administered 2023-10-28: 20 mg via INTRAVENOUS
  Filled 2023-10-28: qty 50

## 2023-10-28 MED ORDER — CETIRIZINE HCL 10 MG/ML IV SOLN
10.0000 mg | Freq: Once | INTRAVENOUS | Status: AC
  Administered 2023-10-28: 10 mg via INTRAVENOUS
  Filled 2023-10-28: qty 1

## 2023-10-28 MED ORDER — ACETAMINOPHEN 325 MG PO TABS
650.0000 mg | ORAL_TABLET | Freq: Once | ORAL | Status: AC
Start: 2023-10-28 — End: 2023-10-28
  Administered 2023-10-28: 650 mg via ORAL
  Filled 2023-10-28: qty 2

## 2023-10-28 MED ORDER — METHYLPREDNISOLONE SODIUM SUCC 125 MG IJ SOLR
125.0000 mg | Freq: Once | INTRAMUSCULAR | Status: AC
  Administered 2023-10-28: 125 mg via INTRAVENOUS
  Filled 2023-10-28: qty 2

## 2023-10-28 MED ORDER — SODIUM CHLORIDE 0.9 % IV SOLN
510.0000 mg | Freq: Once | INTRAVENOUS | Status: AC
  Administered 2023-10-28: 510 mg via INTRAVENOUS
  Filled 2023-10-28: qty 510

## 2023-10-28 NOTE — Progress Notes (Signed)
Patient presents today for iron infusion.  Patient is in satisfactory condition with no new complaints voiced.  Vital signs are stable.  IV placed in L arm.  IV flushed well with good blood return noted.   We will proceed with infusion per provider orders.    Patient tolerated infusion well with no complaints voiced.  Patient left ambulatory in stable condition.  Vital signs stable at discharge.  Follow up as scheduled.    

## 2023-10-28 NOTE — Patient Instructions (Signed)
 [facility] - A DEPT OF [facility]  Discharge Instructions: Thank you for choosing [facility] to provide your oncology and hematology care.  If you have a lab appointment with the Cancer Center - please note that after April 8th, 2024, all labs will be drawn in the cancer center.  You do not have to check in or register with the main entrance as you have in the past but will complete your check-in in the cancer center.  Wear comfortable clothing and clothing appropriate for easy access to any Portacath or PICC line.   We strive to give you quality time with your provider. You may need to reschedule your appointment if you arrive late (15 or more minutes).  Arriving late affects you and other patients whose appointments are after yours.  Also, if you miss three or more appointments without notifying the office, you may be dismissed from the clinic at the provider's discretion.      For prescription refill requests, have your pharmacy contact our office and allow 72 hours for refills to be completed.    Today you received the following:  Feraheme.  Ferumoxytol Injection What is this medication? FERUMOXYTOL (FER ue MOX i tol) treats low levels of iron in your body (iron deficiency anemia). Iron is a mineral that plays an important role in making red blood cells, which carry oxygen from your lungs to the rest of your body. This medicine may be used for other purposes; ask your health care provider or pharmacist if you have questions. COMMON BRAND NAME(S): Feraheme What should I tell my care team before I take this medication? They need to know if you have any of these conditions: Anemia not caused by low iron levels High levels of iron in the blood Magnetic resonance imaging (MRI) test scheduled An unusual or allergic reaction to iron, other medications, foods, dyes, or preservatives Pregnant or trying to get pregnant Breastfeeding How should I  use this medication? This medication is injected into a vein. It is given by your care team in a hospital or clinic setting. Talk to your care team the use of this medication in children. Special care may be needed. Overdosage: If you think you have taken too much of this medicine contact a poison control center or emergency room at once. NOTE: This medicine is only for you. Do not share this medicine with others. What if I miss a dose? It is important not to miss your dose. Call your care team if you are unable to keep an appointment. What may interact with this medication? Other iron products This list may not describe all possible interactions. Give your health care provider a list of all the medicines, herbs, non-prescription drugs, or dietary supplements you use. Also tell them if you smoke, drink alcohol, or use illegal drugs. Some items may interact with your medicine. What should I watch for while using this medication? Visit your care team for regular checks on your progress. Tell your care team if your symptoms do not start to get better or if they get worse. You may need blood work done while you are taking this medication. You may need to eat more foods that contain iron. Talk to your care team. Foods that contain iron include whole grains or cereals, dried fruits, beans, peas, leafy green vegetables, and organ meats (liver, kidney). What side effects may I notice from receiving this medication? Side effects that you should  report to your care team as soon as possible: Allergic reactions--skin rash, itching, hives, swelling of the face, lips, tongue, or throat Low blood pressure--dizziness, feeling faint or lightheaded, blurry vision Shortness of breath Side effects that usually do not require medical attention (report to your care team if they continue or are bothersome): Flushing Headache Joint pain Muscle pain Nausea Pain, redness, or irritation at injection site This list  may not describe all possible side effects. Call your doctor for medical advice about side effects. You may report side effects to FDA at 1-800-FDA-1088. Where should I keep my medication? This medication is given in a hospital or clinic. It will not be stored at home. NOTE: This sheet is a summary. It may not cover all possible information. If you have questions about this medicine, talk to your doctor, pharmacist, or health care provider.  2024 Elsevier/Gold Standard (2022-11-06 00:00:00)    To help prevent nausea and vomiting after your treatment, we encourage you to take your nausea medication as directed.  BELOW ARE SYMPTOMS THAT SHOULD BE REPORTED IMMEDIATELY: *FEVER GREATER THAN 100.4 F (38 C) OR HIGHER *CHILLS OR SWEATING *NAUSEA AND VOMITING THAT IS NOT CONTROLLED WITH YOUR NAUSEA MEDICATION *UNUSUAL SHORTNESS OF BREATH *UNUSUAL BRUISING OR BLEEDING *URINARY PROBLEMS (pain or burning when urinating, or frequent urination) *BOWEL PROBLEMS (unusual diarrhea, constipation, pain near the anus) TENDERNESS IN MOUTH AND THROAT WITH OR WITHOUT PRESENCE OF ULCERS (sore throat, sores in mouth, or a toothache) UNUSUAL RASH, SWELLING OR PAIN  UNUSUAL VAGINAL DISCHARGE OR ITCHING   Items with * indicate a potential emergency and should be followed up as soon as possible or go to the Emergency Department if any problems should occur.  Please show the CHEMOTHERAPY ALERT CARD or IMMUNOTHERAPY ALERT CARD at check-in to the Emergency Department and triage nurse.  Should you have questions after your visit or need to cancel or reschedule your appointment, please contact [facility] [place] - A DEPT OF [facility] [phone]  and follow the prompts.  Office hours are 8:00 a.m. to 4:30 p.m. Monday - Friday. Please note that voicemails left after 4:00 p.m. may not be returned until the following business day.  We are closed weekends and major holidays. You have access to a  nurse at all times for urgent questions. Please call the main number to the clinic [phone] and follow the prompts.  For any non-urgent questions, you may also contact your provider using MyChart. We now offer e-Visits for anyone 39 and older to request care online for non-urgent symptoms. For details visit [email or web address].   Also download the MyChart app! Go to the app store, search "MyChart", open the app, select [place], and log in with your MyChart username and password.

## 2023-10-30 ENCOUNTER — Other Ambulatory Visit: Payer: Self-pay

## 2023-10-30 DIAGNOSIS — F411 Generalized anxiety disorder: Secondary | ICD-10-CM

## 2023-10-31 ENCOUNTER — Encounter: Payer: Self-pay | Admitting: Nurse Practitioner

## 2023-10-31 ENCOUNTER — Ambulatory Visit (INDEPENDENT_AMBULATORY_CARE_PROVIDER_SITE_OTHER): Admitting: Nurse Practitioner

## 2023-10-31 VITALS — BP 142/76 | HR 87 | Ht 63.0 in | Wt 224.8 lb

## 2023-10-31 DIAGNOSIS — E1165 Type 2 diabetes mellitus with hyperglycemia: Secondary | ICD-10-CM

## 2023-10-31 DIAGNOSIS — E559 Vitamin D deficiency, unspecified: Secondary | ICD-10-CM | POA: Diagnosis not present

## 2023-10-31 DIAGNOSIS — Z7984 Long term (current) use of oral hypoglycemic drugs: Secondary | ICD-10-CM

## 2023-10-31 DIAGNOSIS — E782 Mixed hyperlipidemia: Secondary | ICD-10-CM

## 2023-10-31 MED ORDER — METFORMIN HCL 500 MG PO TABS: 500.0000 mg | ORAL_TABLET | Freq: Two times a day (BID) | ORAL | 3 refills | Status: AC

## 2023-10-31 NOTE — Progress Notes (Signed)
 10/31/2023, 11:19 AM   Endocrinology follow-up note  Subjective:    Patient ID: Wendy Kramer, female    DOB: [birth year].  Wendy Kramer is being seen in follow-up after she was seen in consultation for management of currently uncontrolled symptomatic diabetes requested by  Bevely Doffing, FNP.   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   Unspecified asthma with (acute) exacerbation    Vitamin D deficiency     Past Surgical History:  Procedure Laterality Date   BIOPSY  03/05/2018   Procedure: BIOPSY;  Surgeon: Shaaron Lamar HERO, MD;  [facility];  Service: Endoscopy;;  gastric   CHOLECYSTECTOMY     COLONOSCOPY WITH PROPOFOL  N/A 03/05/2018   Dr. Shaaron: Diverticulosis in the sigmoid and descending colon.  3 colon polyps removed, 2 hyperplastic and one sessile serrated polyp.  Next colonoscopy 3 years.   COLONOSCOPY WITH PROPOFOL  N/A 12/27/2022   Procedure: COLONOSCOPY WITH PROPOFOL ;  Surgeon: Cinderella Deatrice FALCON, MD;  [facility]: [facility];  Service: Endoscopy;  Laterality: N/A;   ESOPHAGOGASTRODUODENOSCOPY (EGD) WITH PROPOFOL  N/A 03/05/2018   Dr. Shaaron: Erosive reflux esophagitis, small hiatal hernia, erosive gastropathy with reactive gastropathy on biopsy.  No H. pylori.   ESOPHAGOGASTRODUODENOSCOPY (EGD) WITH PROPOFOL  N/A 12/27/2022   Procedure: ESOPHAGOGASTRODUODENOSCOPY (EGD)  WITH PROPOFOL ;  Surgeon: Cinderella Deatrice FALCON, MD;  [facility];  Service: Endoscopy;  Laterality: N/A;   ESOPHAGOGASTRODUODENOSCOPY (EGD) WITH PROPOFOL  N/A 04/08/2023   Procedure: ESOPHAGOGASTRODUODENOSCOPY (EGD) WITH PROPOFOL ;  Surgeon: Eartha Angelia Sieving, MD;  [facility];  Service: Gastroenterology;  Laterality: N/A;   ESOPHAGOGASTRODUODENOSCOPY (EGD) WITH PROPOFOL  N/A 06/11/2023   Procedure: ESOPHAGOGASTRODUODENOSCOPY (EGD) WITH PROPOFOL ;  Surgeon: Shaaron Lamar HERO, MD;  [facility];  Service: Endoscopy;  Laterality: N/A;  900am, asa 3   HERNIA REPAIR     umbilical   HOT HEMOSTASIS  12/27/2022   Procedure: HOT HEMOSTASIS (ARGON PLASMA COAGULATION/BICAP);  Surgeon: Cinderella Deatrice FALCON, MD;  [facility];  Service: Endoscopy;;   HOT HEMOSTASIS  06/11/2023   Procedure: EGD, WITH ARGON PLASMA COAGULATION;  Surgeon: Shaaron Lamar HERO, MD;  [facility];  Service: Endoscopy;;   POLYPECTOMY  03/05/2018   Procedure: POLYPECTOMY;  Surgeon: Shaaron Lamar HERO, MD;  [facility];  Service: Endoscopy;;  colon   SHOULDER ARTHROSCOPY Right    TEE WITHOUT CARDIOVERSION  2018   patent foramen ovale  TONSILLECTOMY AND ADENOIDECTOMY     TUBAL LIGATION      Social History   Socioeconomic History   Marital status: Legally Separated    Spouse name: Not on file   Number of children: Not on file   Years of education: Not on file   Highest education level: GED or equivalent  Occupational History   Not on file  Tobacco Use   Smoking status: Every Day    Current packs/day: 0.50    Average packs/day: 0.5 packs/day for 40.0 years (20.0 ttl pk-yrs)    Types: Cigarettes   Smokeless tobacco: Never  Vaping Use   Vaping status: Never Used  Substance and Sexual Activity   Alcohol  use: No   Drug use: No   Sexual activity: Not Currently    Birth control/protection: Post-menopausal, Surgical    Comment: tubal  Other Topics Concern   Not on file   Social History Narrative   Not on file   Social Drivers of Health   Financial Resource Strain: Low Risk  (07/16/2023)   Overall Financial Resource Strain (CARDIA)    Difficulty of Paying Living Expenses: Not very hard  Food Insecurity: Patient Declined (07/16/2023)   Hunger Vital Sign    Worried About Running Out of Food in the Last Year: Patient declined    Ran Out of Food in the Last Year: Patient declined  Transportation Needs: No Transportation Needs (07/16/2023)   PRAPARE - Administrator, Civil Service (Medical): No    Lack of Transportation (Non-Medical): No  Physical Activity: Inactive (07/16/2023)   Exercise Vital Sign    Days of Exercise per Week: 0 days    Minutes of Exercise per Session: 30 min  Stress: No Stress Concern Present (07/16/2023)   [employer] of Occupational Health - Occupational Stress Questionnaire    Feeling of Stress : Only a little  Social Connections: Socially Isolated (07/16/2023)   Social Connection and Isolation Panel    Frequency of Communication with Friends and Family: More than three times a week    Frequency of Social Gatherings with Friends and Family: More than three times a week    Attends Religious Services: Never    Database administrator or Organizations: No    Attends Engineer, structural: Never    Marital Status: Separated    Family History  Problem Relation Age of Onset   Breast cancer Maternal Aunt    Colon cancer Other        maternal great grandfather   Liver disease Brother        Fatty liver   HIV Brother    Hypertension Mother    Hyperlipidemia Mother    Hypertension Father    Thyroid  disease Sister    Pancreatic cancer Sister    Healthy Daughter    Healthy Son    Healthy Son    Heart disease Son     Outpatient Encounter Medications as of 10/31/2023  Medication Sig   Accu-Chek Softclix Lancets lancets Use as instructed to monitor glucose 1 times daily.   acetaminophen  (TYLENOL ) 500 MG  tablet Take 1,000 mg by mouth every 6 (six) hours as needed for moderate pain (pain score 4-6).   albuterol  (PROVENTIL  HFA;VENTOLIN  HFA) 108 (90 Base) MCG/ACT inhaler Inhale 1 puff into the lungs every 6 (six) hours as needed for wheezing or shortness of breath.   ALPRAZolam  (XANAX ) 0.25 MG tablet Take 1 tablet (0.25 mg total) by mouth 2 (two) times  daily as needed for anxiety.   Blood Glucose Monitoring Suppl (ACCU-CHEK GUIDE ME) w/Device KIT 1 Piece by Does not apply route as directed.   fluticasone  (FLONASE ) 50 MCG/ACT nasal spray Place 2 sprays into both nostrils daily as needed for allergies or rhinitis.   fluticasone  furoate-vilanterol (BREO ELLIPTA ) 100-25 MCG/ACT AEPB Inhale 1 puff into the lungs daily.   glucose blood (ACCU-CHEK GUIDE) test strip USE TO CHECK BLOOD SUGARS ONCE DAILY   lisinopril (ZESTRIL) 20 MG tablet Take 10 mg by mouth every evening.   nystatin  (MYCOSTATIN /NYSTOP ) powder Apply 1 Application topically 3 (three) times daily.   pantoprazole  (PROTONIX ) 40 MG tablet Take 1 tablet (40 mg total) by mouth 2 (two) times daily before a meal.   traMADol  (ULTRAM ) 50 MG tablet Take 100 mg by mouth every 8 (eight) hours as needed for moderate pain (pain score 4-6).   [DISCONTINUED] metFORMIN  (GLUCOPHAGE ) 500 MG tablet Take 1 tablet (500 mg total) by mouth 2 (two) times daily with a meal.   metFORMIN  (GLUCOPHAGE ) 500 MG tablet Take 1 tablet (500 mg total) by mouth 2 (two) times daily with a meal.   No facility-administered encounter medications on file as of 10/31/2023.    ALLERGIES: Allergies  Allergen Reactions   Asa [Aspirin] Anaphylaxis    Tolerates other NSAIDS   Augmentin [Amoxicillin-Pot Clavulanate] Nausea And Vomiting and Other (See Comments)    Syncope   Ferric Derisomaltose  Shortness Of Breath, Nausea Only, Swelling and Other (See Comments)    Chest tightness, back and joint pain    Trelegy Ellipta [Fluticasone -Umeclidin-Vilant] Anxiety   Levaquin [Levofloxacin In  D5w] Itching and Other (See Comments)    body turned red   Lovenox [Enoxaparin Sodium] Other (See Comments)    physician instructed not to use ever again. white veins popped out   Whole Blood Palpitations and Rash    VACCINATION STATUS: There is no immunization history for the selected administration types on file for this patient.  Diabetes She presents for her follow-up diabetic visit. She has type 2 diabetes mellitus. Onset time: She was diagnosed at approximate age of 60 years, after several years of prediabetes. Her disease course has been stable. There are no hypoglycemic associated symptoms. Pertinent negatives for hypoglycemia include no confusion, headaches, pallor or seizures. Pertinent negatives for diabetes include no chest pain, no fatigue, no polydipsia, no polyphagia and no polyuria. There are no hypoglycemic complications. Symptoms are stable. Diabetic complications include a CVA. Risk factors for coronary artery disease include diabetes mellitus, dyslipidemia, family history, obesity, tobacco exposure, post-menopausal, sedentary lifestyle and hypertension. Current diabetic treatment includes oral agent (monotherapy). She is compliant with treatment some of the time (lowered down to 500 mg po BID herself due to GI SE). Her weight is fluctuating minimally. She is following a generally unhealthy diet. When asked about meal planning, she reported none. She has not had a previous visit with a dietitian. She never participates in exercise. Her home blood glucose trend is fluctuating minimally. Her overall blood glucose range is 130-140 mg/dl. (She presents today with her meter, no logs, showing improved but still above target fasting glycemic profile.  Her POCT A1c today is 9.1%, increasing from last visit of 8.3%.   She does have anemia, which may skew this result and not be the truest indicator of her control.  Analysis of her meter shows 7-day average of 128, 14-day average of 145,  30-day average of 152, 90-day average of 166.  She does get iron  infusions  every so often and she does take a steroid prior to those treatments.) An ACE inhibitor/angiotensin II receptor blocker is being taken. She does not see a podiatrist.Eye exam is current.  Hyperlipidemia This is a chronic problem. The current episode started more than 1 year ago. Factors aggravating her hyperlipidemia include fatty foods. Pertinent negatives include no chest pain, myalgias or shortness of breath. She is currently on no antihyperlipidemic treatment. Compliance problems include adherence to diet and adherence to exercise.  Risk factors for coronary artery disease include diabetes mellitus, hypertension, obesity, post-menopausal and a sedentary lifestyle.  Hypertension This is a chronic problem. The current episode started more than 1 year ago. The problem has been waxing and waning since onset. The problem is uncontrolled. Pertinent negatives include no chest pain, headaches, palpitations or shortness of breath. There are no associated agents to hypertension. Risk factors for coronary artery disease include dyslipidemia, diabetes mellitus, obesity, smoking/tobacco exposure, sedentary lifestyle, post-menopausal state and family history. Past treatments include ACE inhibitors and diuretics. The current treatment provides moderate improvement. Compliance problems include diet and exercise.  Hypertensive end-organ damage includes CVA. Identifiable causes of hypertension include sleep apnea.    Review of systems  Constitutional: + Minimally fluctuating body weight,  current Body mass index is 39.82 kg/m. , no fatigue, no subjective hyperthermia, no subjective hypothermia Eyes: no blurry vision, no xerophthalmia ENT: no sore throat, no nodules palpated in throat, no dysphagia/odynophagia, no hoarseness Cardiovascular: no chest pain, no shortness of breath, no palpitations, no leg swelling Respiratory: no cough, no  shortness of breath Gastrointestinal: no nausea/vomiting/diarrhea Musculoskeletal: no muscle/joint aches Skin: no rashes, no hyperemia Neurological: no tremors, no numbness, no tingling, no dizziness Psychiatric: no depression, no anxiety   Objective:    BP (!) 142/76 (BP Location: Right Arm, Patient Position: Sitting, Cuff Size: Large) Comment: Retake BP manuel cuff  Pulse 87   Ht 5' 3 (1.6 m)   Wt 224 lb 12.8 oz (102 kg)   LMP 09/06/2016   BMI 39.82 kg/m   Wt Readings from Last 3 Encounters:  10/31/23 224 lb 12.8 oz (102 kg)  10/20/23 226 lb 6.6 oz (102.7 kg)  10/15/23 224 lb 1.9 oz (101.7 kg)   BP Readings from Last 3 Encounters:  10/31/23 (!) 142/76  10/28/23 (!) 166/68  10/20/23 (!) 142/61     Physical Exam- Limited  Constitutional:  Body mass index is 39.82 kg/m. , not in acute distress, normal state of mind Eyes:  EOMI, no exophthalmos Musculoskeletal: no gross deformities, strength intact in all four extremities, no gross restriction of joint movements Skin:  no rashes, no hyperemia Neurological: no tremor with outstretched hands   Diabetic Foot Exam - Simple   No data filed     CMP ( most recent) CMP     Component Value Date/Time   NA 140 10/23/2023 0933   K 4.8 10/23/2023 0933   CL 103 10/23/2023 0933   CO2 21 10/23/2023 0933   GLUCOSE 188 (H) 10/23/2023 0933   GLUCOSE 178 (H) 04/30/2023 0937   BUN 14 10/23/2023 0933   CREATININE 0.83 10/23/2023 0933   CREATININE 0.67 12/25/2022 0759   CALCIUM 9.2 10/23/2023 0933   PROT 6.5 10/23/2023 0933   ALBUMIN 3.9 10/23/2023 0933   AST 23 10/23/2023 0933   ALT 12 10/23/2023 0933   ALKPHOS 97 10/23/2023 0933   BILITOT 0.3 10/23/2023 0933   GFRNONAA >60 04/30/2023 0937   GFRAA 85 02/15/2020 1018     Diabetic  Labs (most recent): Lab Results  Component Value Date   HGBA1C 8.3 (A) 07/01/2023   HGBA1C 9.4 (H) 12/26/2022   HGBA1C 10.0 (A) 06/24/2022   MICROALBUR 80 02/09/2020     Lab Results   Component Value Date   TSH 1.663 02/06/2023   TSH 1.470 01/03/2023   TSH 1.56 05/01/2022   TSH 1.85 07/23/2019   FREET4 1.46 01/03/2023   FREET4 1.2 05/01/2022      Assessment & Plan:   1) Uncontrolled type 2 diabetes mellitus with hyperglycemia (HCC)  - Wendy Kramer has currently uncontrolled symptomatic type 2 DM since  63 years of age.  She presents today with her meter, no logs, showing improved but still above target fasting glycemic profile.  Her POCT A1c today is 9.1%, increasing from last visit of 8.3%.   She does have anemia, which may skew this result and not be the truest indicator of her control.  Analysis of her meter shows 7-day average of 128, 14-day average of 145, 30-day average of 152, 90-day average of 166.  She does get iron  infusions every so often and she does take a steroid prior to those treatments.  - I had a long discussion with her about the progressive nature of diabetes and the pathology behind its complications. -her diabetes is complicated by obesity/sedentary life, active smoking and she remains at a high risk for more acute and chronic complications which include CAD, CVA, CKD, retinopathy, and neuropathy. These are all discussed in detail with her.  The following Lifestyle Medicine recommendations according to American College of Lifestyle Medicine [facility]) were discussed and offered to patient and she agrees to start the journey:  A. Whole Foods, Plant-based plate comprising of fruits and vegetables, plant-based proteins, whole-grain carbohydrates was discussed in detail with the patient.   A list for source of those nutrients were also provided to the patient.  Patient will use only water  or unsweetened tea for hydration. B.  The need to stay away from risky substances including alcohol , smoking; obtaining 7 to 9 hours of restorative sleep, at least 150 minutes of moderate intensity exercise weekly, the importance of healthy social connections,  and stress  reduction techniques were discussed. C.  A full color page of  Calorie density of various food groups per pound showing examples of each food groups was provided to the patient.  - Nutritional counseling repeated at each appointment due to patients tendency to fall back in to old habits.  - The patient admits there is a room for improvement in their diet and drink choices. -  Suggestion is made for the patient to avoid simple carbohydrates from their diet including Cakes, Sweet Desserts / Pastries, Ice Cream, Soda (diet and regular), Sweet Tea, Candies, Chips, Cookies, Sweet Pastries, Store Bought Juices, Alcohol  in Excess of 1-2 drinks a day, Artificial Sweeteners, Coffee Creamer, and Sugar-free Products. This will help patient to have stable blood glucose profile and potentially avoid unintended weight gain.   - I encouraged the patient to switch to unprocessed or minimally processed complex starch and increased protein intake (animal or plant source), fruits, and vegetables.   - Patient is advised to stick to a routine mealtimes to eat 3 meals a day and avoid unnecessary snacks (to snack only to correct hypoglycemia).  - she will be scheduled with Penny Crumpton, RDN, CDE for diabetes education.  - I have approached her with the following individualized plan to manage  her diabetes and patient agrees:   -  Given her improving glycemic profile per readings, no changes will be made to her regimen today.  She is advised to continue Metformin  500 mg po twice daily with meals.   Her A1c is likely skewed due to her anemia.  She is encouraged to start monitoring glucose once daily, before breakfast to help her regain control of her diabetes.   - Specific targets for  A1c;  LDL, HDL,  and Triglycerides were discussed with the patient.  2) Blood Pressure /Hypertension:   Her blood pressure is controlled to target.  She is advised to continue medications as prescribed by PCP.  3)  Lipids/Hyperlipidemia:  Her recent lipid panel from 12/28/22 shows controlled LDL of 72.  She is not currently on any lipid lowering agents. medications.    4) Weight/Diet:  Her Body mass index is 39.82 kg/m.  -   clearly complicating her diabetes care.   she is  a candidate for weight loss. I discussed with her the fact that loss of 5 - 10% of her  current body weight will have the most impact on her diabetes management.  Exercise, and detailed carbohydrates information provided  -  detailed on discharge instructions.  5) Chronic Care/Health Maintenance: -she is on ACEI medications and is encouraged to initiate and continue to follow up with Ophthalmology, Dentist,  Podiatrist at least yearly or according to recommendations, and advised to  Quit smoking. I have recommended yearly flu vaccine and pneumonia vaccine at least every 5 years; moderate intensity exercise for up to 150 minutes weekly; and  sleep for at least 7 hours a day.  - she is advised to maintain close follow up with Bevely Doffing, FNP for primary care needs, as well as her other providers for optimal and coordinated care.     I spent  26  minutes in the care of the patient today including review of labs from CMP, Lipids, Thyroid  Function, Hematology (current and previous including abstractions from other facilities); face-to-face time discussing  her blood glucose readings/logs, discussing hypoglycemia and hyperglycemia episodes and symptoms, medications doses, her options of short and long term treatment based on the latest standards of care / guidelines;  discussion about incorporating lifestyle medicine;  and documenting the encounter. Risk reduction counseling performed per USPSTF guidelines to reduce obesity and cardiovascular risk factors.     Please refer to Patient Instructions for Blood Glucose Monitoring and Insulin /Medications Dosing Guide  in media tab for additional information. Please  also refer to  Patient Self  Inventory in the Media  tab for reviewed elements of pertinent patient history.  Wendy Kramer participated in the discussions, expressed understanding, and voiced agreement with the above plans.  All questions were answered to her satisfaction. she is encouraged to contact clinic should she have any questions or concerns prior to her return visit.   Follow up plan: - Return in about 4 months (around 03/01/2024) for Diabetes F/U with A1c in office, No previsit labs.  Benton Rio, [facility] [address] [place], [place] [address] Phone: [phone] Fax: [phone]  10/31/2023, 11:19 AM

## 2023-11-04 ENCOUNTER — Inpatient Hospital Stay

## 2023-11-07 ENCOUNTER — Inpatient Hospital Stay: Attending: Hematology

## 2023-11-13 ENCOUNTER — Ambulatory Visit (HOSPITAL_COMMUNITY)

## 2023-11-13 ENCOUNTER — Ambulatory Visit (HOSPITAL_COMMUNITY): Admitting: Vascular Surgery

## 2023-11-13 NOTE — Telephone Encounter (Signed)
 Wendy Kramer, please let her know that we don't have standing orders for her. She follows with hematology regularly for her IDA and recently seen, with plans for iron  infusion tomorrow.   If she would like for us  to check her Hgb we can arrange for her to have CBC now.

## 2023-11-14 ENCOUNTER — Inpatient Hospital Stay: Attending: Oncology

## 2023-11-14 VITALS — BP 154/66 | HR 84 | Temp 97.4°F | Resp 18

## 2023-11-14 DIAGNOSIS — Z79899 Other long term (current) drug therapy: Secondary | ICD-10-CM | POA: Insufficient documentation

## 2023-11-14 DIAGNOSIS — K31811 Angiodysplasia of stomach and duodenum with bleeding: Secondary | ICD-10-CM | POA: Insufficient documentation

## 2023-11-14 DIAGNOSIS — D500 Iron deficiency anemia secondary to blood loss (chronic): Secondary | ICD-10-CM | POA: Insufficient documentation

## 2023-11-14 MED ORDER — METHYLPREDNISOLONE SODIUM SUCC 125 MG IJ SOLR
125.0000 mg | Freq: Once | INTRAMUSCULAR | Status: AC
  Administered 2023-11-14: 125 mg via INTRAVENOUS
  Filled 2023-11-14: qty 2

## 2023-11-14 MED ORDER — ACETAMINOPHEN 325 MG PO TABS
650.0000 mg | ORAL_TABLET | Freq: Once | ORAL | Status: AC
  Administered 2023-11-14: 650 mg via ORAL
  Filled 2023-11-14: qty 2

## 2023-11-14 MED ORDER — SODIUM CHLORIDE 0.9 % IV SOLN: INTRAVENOUS | Status: DC

## 2023-11-14 MED ORDER — CETIRIZINE HCL 10 MG/ML IV SOLN
10.0000 mg | Freq: Once | INTRAVENOUS | Status: AC
  Administered 2023-11-14: 10 mg via INTRAVENOUS
  Filled 2023-11-14: qty 1

## 2023-11-14 MED ORDER — FAMOTIDINE IN NACL 20-0.9 MG/50ML-% IV SOLN
20.0000 mg | Freq: Once | INTRAVENOUS | Status: AC
  Administered 2023-11-14: 20 mg via INTRAVENOUS
  Filled 2023-11-14: qty 50

## 2023-11-14 MED ORDER — SODIUM CHLORIDE 0.9 % IV SOLN
510.0000 mg | Freq: Once | INTRAVENOUS | Status: AC
  Administered 2023-11-14: 510 mg via INTRAVENOUS
  Filled 2023-11-14: qty 510

## 2023-11-14 NOTE — Progress Notes (Signed)
 Patient tolerated iron  infusion with no complaints voiced.  Peripheral IV site clean and dry with good blood return noted before and after infusion.  Pt observed for 30 minutes post iron  infusion without any complications. VSS with discharge and left in satisfactory condition with no s/s of distress noted. All follow ups as scheduled.  Wendy Kramer

## 2023-11-14 NOTE — Patient Instructions (Signed)

## 2023-11-23 ENCOUNTER — Ambulatory Visit: Payer: Self-pay

## 2023-11-24 ENCOUNTER — Other Ambulatory Visit: Payer: Self-pay

## 2023-11-24 DIAGNOSIS — F411 Generalized anxiety disorder: Secondary | ICD-10-CM

## 2023-12-15 ENCOUNTER — Ambulatory Visit: Admitting: Physician Assistant

## 2023-12-18 NOTE — Telephone Encounter (Unsigned)
 Copied from CRM [id number]. Topic: Clinical - Medication Question >> Dec 18, 2023 11:50 AM Willma R wrote: Reason for CRM: Patient is calling inquiring to see if she is able to be placed back on budesonide -formoterol  (SYMBICORT ) 80-4.5 MCG/ACT inhaler. States it is the only inhaler that seems to help. Also states she was changed because the prior auth for it was declined, but wants to try again to see if it will be approved.  Patient can be reached at [phone]

## 2023-12-19 ENCOUNTER — Other Ambulatory Visit: Payer: Self-pay

## 2023-12-19 DIAGNOSIS — D649 Anemia, unspecified: Secondary | ICD-10-CM

## 2023-12-19 DIAGNOSIS — K7460 Unspecified cirrhosis of liver: Secondary | ICD-10-CM

## 2023-12-19 DIAGNOSIS — D500 Iron deficiency anemia secondary to blood loss (chronic): Secondary | ICD-10-CM

## 2023-12-22 ENCOUNTER — Inpatient Hospital Stay: Attending: Oncology

## 2023-12-22 DIAGNOSIS — D500 Iron deficiency anemia secondary to blood loss (chronic): Secondary | ICD-10-CM | POA: Insufficient documentation

## 2023-12-22 DIAGNOSIS — K7460 Unspecified cirrhosis of liver: Secondary | ICD-10-CM

## 2023-12-22 DIAGNOSIS — K31811 Angiodysplasia of stomach and duodenum with bleeding: Secondary | ICD-10-CM | POA: Insufficient documentation

## 2023-12-22 DIAGNOSIS — D649 Anemia, unspecified: Secondary | ICD-10-CM

## 2023-12-22 LAB — CBC WITH DIFFERENTIAL/PLATELET
Abs Immature Granulocytes: 0.01 K/uL (ref 0.00–0.07)
Basophils Absolute: 0 K/uL (ref 0.0–0.1)
Basophils Relative: 1 %
Eosinophils Absolute: 0.4 K/uL (ref 0.0–0.5)
Eosinophils Relative: 9 %
HCT: 40.7 % (ref 36.0–46.0)
Hemoglobin: 12.7 g/dL (ref 12.0–15.0)
Immature Granulocytes: 0 %
Lymphocytes Relative: 31 %
Lymphs Abs: 1.5 K/uL (ref 0.7–4.0)
MCH: 27.4 pg (ref 26.0–34.0)
MCHC: 31.2 g/dL (ref 30.0–36.0)
MCV: 87.7 fL (ref 80.0–100.0)
Monocytes Absolute: 0.3 K/uL (ref 0.1–1.0)
Monocytes Relative: 7 %
Neutro Abs: 2.5 K/uL (ref 1.7–7.7)
Neutrophils Relative %: 52 %
Platelets: 179 K/uL (ref 150–400)
RBC: 4.64 MIL/uL (ref 3.87–5.11)
RDW: 20.9 % — ABNORMAL HIGH (ref 11.5–15.5)
WBC: 4.7 K/uL (ref 4.0–10.5)
nRBC: 0 % (ref 0.0–0.2)

## 2023-12-22 LAB — IRON AND TIBC
Iron: 47 ug/dL (ref 28–170)
Saturation Ratios: 11 % (ref 10.4–31.8)
TIBC: 441 ug/dL (ref 250–450)
UIBC: 394 ug/dL

## 2023-12-22 LAB — FERRITIN: Ferritin: 13 ng/mL (ref 11–307)

## 2023-12-23 ENCOUNTER — Other Ambulatory Visit: Payer: Self-pay

## 2023-12-23 ENCOUNTER — Telehealth: Payer: Self-pay

## 2023-12-23 ENCOUNTER — Ambulatory Visit: Payer: Self-pay | Admitting: Oncology

## 2023-12-23 DIAGNOSIS — J441 Chronic obstructive pulmonary disease with (acute) exacerbation: Secondary | ICD-10-CM

## 2023-12-23 MED ORDER — BUDESONIDE-FORMOTEROL FUMARATE 80-4.5 MCG/ACT IN AERO: 2.0000 | INHALATION_SPRAY | Freq: Two times a day (BID) | RESPIRATORY_TRACT | 5 refills | Status: DC

## 2023-12-23 NOTE — Telephone Encounter (Signed)
 Patient wanting to follow up on her Symbicort - requesting PA for it says she needs it this time of year. Please advise Thank you

## 2023-12-23 NOTE — Progress Notes (Signed)
 I would recommend another dose of IV Feraheme given her ferritin is low.  I know she does not see us  back until the end of October and I think that is too long.  See if she is agreeable to another dose of IV Feraheme.  Delon Hope, NP 12/23/2023 3:07 PM

## 2023-12-24 NOTE — Telephone Encounter (Signed)
 Patient advised.

## 2023-12-24 NOTE — Progress Notes (Signed)
 TY

## 2023-12-25 ENCOUNTER — Other Ambulatory Visit: Payer: Self-pay

## 2023-12-25 DIAGNOSIS — F411 Generalized anxiety disorder: Secondary | ICD-10-CM

## 2023-12-30 ENCOUNTER — Ambulatory Visit: Payer: Self-pay

## 2023-12-30 NOTE — Telephone Encounter (Signed)
 FYI Only or Action Required?: FYI only for provider.  Patient was last seen in primary care on 10/15/2023 by Bevely Doffing, FNP.  Called Nurse Triage reporting Shortness of Breath.  Symptoms began a week ago.  Interventions attempted: Prescription medications: Breztri.  Symptoms are: unchanged.  Triage Disposition: See PCP Within 2 Weeks  Patient/caregiver understands and will follow disposition?: Yes Reason for Disposition  [1] MILD longstanding difficulty breathing (e.g., minimal/no SOB at rest, SOB with walking, pulse < 100) AND [2] SAME as normal  Answer Assessment - Initial Assessment Questions Diagnoses Esophageal Varices, wants to know if she can take prednisone with this. Stopped Symbicort  a year and a half ago due to insurance issues, states its the only one that works. Was given Breztri samples from PCP a few days ago states does not know if this one works because she's so far inflamed. States this happens every year around this time and is trying to find out about going on the diminishing dose Prednisone and Albuterol  and Ipratropium Nebulizer to regulate her current symptoms so she can then manage with Breztri. Please advise.   1. RESPIRATORY STATUS: Describe your breathing? (e.g., wheezing, shortness of breath, unable to speak, severe coughing)      Wheezing, whistling sound  2. ONSET: When did this breathing problem begin?      A week or so ago, slowly gotten worse  3. PATTERN Does the difficult breathing come and go, or has it been constant since it started?      Constant   4. SEVERITY: How bad is your breathing? (e.g., mild, moderate, severe)      Mild SOB when coughing  5. RECURRENT SYMPTOM: Have you had difficulty breathing before? If Yes, ask: When was the last time? and What happened that time?      Yes  6. LUNG HISTORY: Do you have any history of lung disease?  (e.g., pulmonary embolus, asthma, emphysema)     Asthma  7. CAUSE: What do you  think is causing the breathing problem?      Asthma, not using Symbicort  anymore, given Breztri samples from PCP a few days ago  8. OTHER SYMPTOMS: Do you have any other symptoms? (e.g., chest pain, cough, dizziness, fever, runny nose)     Dry asthma cough  Protocols used: Breathing Difficulty-A-AH  Copied from CRM #[id number]. Topic: Clinical - Red Word Triage >> Dec 30, 2023  2:37 PM Wendy Kramer wrote: [facility] that prompted transfer to Nurse Triage: patient has asthma with sob, also says has stomach bleed but seeing a different Dr for that

## 2023-12-31 ENCOUNTER — Inpatient Hospital Stay

## 2023-12-31 ENCOUNTER — Other Ambulatory Visit: Payer: Self-pay

## 2023-12-31 ENCOUNTER — Ambulatory Visit: Payer: Self-pay

## 2023-12-31 MED ORDER — PREDNISONE 10 MG (21) PO TBPK: ORAL_TABLET | ORAL | 0 refills | Status: DC

## 2023-12-31 NOTE — Telephone Encounter (Signed)
Pt informed

## 2023-12-31 NOTE — Telephone Encounter (Signed)
 FYI Only or Action Required?: Action required by provider: clinical question for provider and update on patient condition.  Patient was last seen in primary care on 10/15/2023 by Bevely Doffing, FNP.  Called Nurse Triage reporting Shortness of Breath.  Symptoms began several days ago.  Interventions attempted: Prescription medications: as prescribed .  Symptoms are: unchanged.  Triage Disposition: Call PCP Now  Patient/caregiver understands and will follow disposition?: Yes   Copied from CRM #[id number]. Topic: Clinical - Red Word Triage >> Dec 31, 2023  1:03 PM Precious C wrote: [facility] that prompted transfer to Nurse Triage: SHORT OF BREATH   Pt called in for SOB and was suppose to receive a call back but has not yet, wants to know what moving forward Reason for Disposition  [1] Prescription not at pharmacy AND [2] was prescribed by doctor (or NP/PA) recently  (Exception: [facility] has access to EMR and prescription is recorded there. Go to Home Care and confirm for pharmacy.)  Answer Assessment - Initial Assessment Questions 1. REASON FOR CALL or QUESTION: What is your reason for calling today? or How can I best     1) Patient was triaged on 12/30/23 for shortness of breath. She was anticipating a follow up call regarding requested prednisone but has not heard back and no medication at pharmacy.      2) This Clinical research associate reviewed chart, per notes by pcp Yes, it's okay for her to take prednisone. Medication order not found in Epic. Patient requesting prednisone to [facility] on [address].      3) Patient reports her sob improved overnight but then had a coughing fit this morning and feel some shortness of breath again. No worse than when she called in on 12/30/23.  Answer Assessment - Initial Assessment Questions 1. REASON FOR CALL or QUESTION: What is your reason for calling today? or How can I best     1) Patient was triaged on 12/30/23 for shortness of breath. She was anticipating a  follow up call regarding requested prednisone but has not heard back and no medication at pharmacy.      2) This Clinical research associate reviewed chart, per notes by pcp Yes, it's okay for her to take prednisone. Medication order not found in Epic. Patient requesting prednisone to [facility] on [address].      3) Patient reports her sob improved overnight but then had a coughing fit this morning and feel some shortness of breath again. No worse than when she called in on 12/30/23.  Protocols used: PCP Call - No Triage-A-AH, Medication Question Call-A-AH

## 2024-01-01 ENCOUNTER — Ambulatory Visit

## 2024-01-05 ENCOUNTER — Ambulatory Visit (INDEPENDENT_AMBULATORY_CARE_PROVIDER_SITE_OTHER)

## 2024-01-05 ENCOUNTER — Telehealth: Payer: Self-pay | Admitting: Pharmacy Technician

## 2024-01-05 ENCOUNTER — Other Ambulatory Visit (HOSPITAL_COMMUNITY): Payer: Self-pay

## 2024-01-05 ENCOUNTER — Inpatient Hospital Stay

## 2024-01-05 VITALS — BP 145/82 | HR 101 | Ht 63.0 in | Wt 224.1 lb

## 2024-01-05 DIAGNOSIS — M059 Rheumatoid arthritis with rheumatoid factor, unspecified: Secondary | ICD-10-CM | POA: Diagnosis not present

## 2024-01-05 DIAGNOSIS — J418 Mixed simple and mucopurulent chronic bronchitis: Secondary | ICD-10-CM

## 2024-01-05 DIAGNOSIS — E1165 Type 2 diabetes mellitus with hyperglycemia: Secondary | ICD-10-CM | POA: Diagnosis not present

## 2024-01-05 DIAGNOSIS — J441 Chronic obstructive pulmonary disease with (acute) exacerbation: Secondary | ICD-10-CM | POA: Diagnosis not present

## 2024-01-05 DIAGNOSIS — F17209 Nicotine dependence, unspecified, with unspecified nicotine-induced disorders: Secondary | ICD-10-CM | POA: Diagnosis not present

## 2024-01-05 MED ORDER — BUDESONIDE-FORMOTEROL FUMARATE 160-4.5 MCG/ACT IN AERO: 2.0000 | INHALATION_SPRAY | Freq: Two times a day (BID) | RESPIRATORY_TRACT | 3 refills | Status: DC

## 2024-01-05 NOTE — Telephone Encounter (Signed)
 Pharmacy Patient Advocate Encounter   Received notification from CoverMyMeds that prior authorization for Budesonide -formoterol  80-45 mcg/act inhaler is required/requested.   Insurance verification completed.   The patient is insured through [facility] [employer].  PA was denied on 07/17/2023.  See previous pharmacy prior auth telephone encounter from 07/17/2023. New prescription has the same associated diagnosis as before. Please advise if the prior authorization should be submitted with a different diagnosis.

## 2024-01-05 NOTE — Progress Notes (Unsigned)
 Established Patient Office Visit  Subjective   Patient ID: Wendy Kramer, female    DOB: 03-May-1960  Age: 63 y.o. MRN: [id number]  Chief Complaint  Patient presents with   Medical Management of Chronic Issues    HPI Discussed the use of AI scribe software for clinical note transcription with the patient, who gave verbal consent to proceed.  History of Present Illness   Wendy Kramer is a 63 year old female with COPD and asthma who presents with medication management issues.  Respiratory symptoms and medication management - Chronic obstructive pulmonary disease (COPD) and asthma with ongoing medication management difficulties due to insurance not covering previously effective medications such as Symbicort . - Trials of Breztri, Trelegy, and Breo have been ineffective or caused adverse effects, including increased congestion with Breo and lack of efficacy with Trelegy. - Currently using prednisone, which has reduced coughing spells. - No current coughing spells or respiratory exacerbations. - History of allergy-induced asthma and chronic bronchitis, previously severe while living in [place] , requiring frequent prednisone use and pulmonology care. - Current nebulizer is approximately twenty years old and not functioning well, may require replacement.  Glucocorticoid therapy effects - Prednisone has paradoxically lowered blood glucose levels, with recent readings dropping to 118 from a usual average of 155. - Concern about prednisone's potential impact on blood pressure and blood sugar levels.  Blood pressure fluctuations - History of hypertension with recent elevated blood pressure at last visit. - Attributes blood pressure fluctuations to changes in weather. - No current blood pressure fluctuations while on prednisone.  Tobacco use - Current smoker. - Does not carry the typical odor associated with smoking.  Hematologic status - Scheduled for another hemoglobin infusion as  levels are stable but need to be higher.      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   Acute blood loss anemia 12/26/2022   Acute anemia 12/26/2022   History of colonic polyps 02/21/2021   Cirrhosis of liver without ascites (HCC) 05/23/2020   LUQ pain 11/26/2019   Encounter for gynecological examination with Papanicolaou smear of cervix 10/06/2019   Routine cervical smear 10/06/2019   Encounter for screening fecal occult blood testing 10/06/2019   Uncontrolled type 2 diabetes mellitus with hyperglycemia (HCC) 09/27/2019   Mixed hyperlipidemia 09/27/2019   Morbid obesity (HCC) 09/27/2019   Abnormal LFTs 03/16/2019   Positive colorectal cancer screening using Cologuard test 12/24/2017   Abdominal pain, epigastric 12/24/2017   Gastroesophageal reflux disease 12/24/2017   Fatty liver 12/24/2017      ROS    Objective:     BP (!) 145/82   Pulse (!) 101   Ht 5' 3 (1.6 m)   Wt 224 lb 1.3 oz (101.6 kg)   LMP 09/06/2016   SpO2 91%   BMI 39.69 kg/m  BP Readings from Last 3 Encounters:  01/05/24 (!) 145/82  11/14/23 (!) 154/66  10/31/23 (!) 142/76   Wt Readings from Last 3 Encounters:  01/05/24 224 lb 1.3 oz (101.6 kg)  10/31/23 224 lb 12.8 oz (102 kg)  10/20/23 226 lb 6.6 oz (102.7 kg)     Physical Exam Vitals and nursing note reviewed.  Constitutional:      Appearance: Normal appearance. She is obese.  HENT:     Head: Normocephalic.     Right Ear: Tympanic membrane, ear canal and external ear normal.     Left Ear: Tympanic membrane, ear canal and external ear normal.     Nose: Nose  normal.     Mouth/Throat:     Mouth: Mucous membranes are moist.     Pharynx: Oropharynx is clear.  Eyes:     Extraocular Movements: Extraocular movements intact.     Pupils: Pupils are equal, round, and reactive to light.  Cardiovascular:     Rate and Rhythm: Normal rate and regular rhythm.  Pulmonary:     Effort: Pulmonary effort is normal.     Breath sounds: Normal breath sounds.  Neurological:     Mental Status: She is alert and oriented to person, place, and time.     Gait: Gait abnormal (antalgic gait).  Psychiatric:        Attention and Perception: Attention normal.        Mood and Affect: Mood normal.        Behavior: Behavior normal.        Thought Content: Thought content normal.      No results found for any visits on 01/05/24.  Last CBC Lab Results  Component Value Date   WBC 4.7 12/22/2023   HGB 12.7 12/22/2023   HCT 40.7 12/22/2023   MCV 87.7 12/22/2023   MCH 27.4 12/22/2023   RDW 20.9 (H) 12/22/2023   PLT 179 12/22/2023   Last metabolic panel Lab Results  Component Value Date   GLUCOSE 188 (H) 10/23/2023   NA 140 10/23/2023   K 4.8 10/23/2023   CL 103 10/23/2023   CO2 21 10/23/2023   BUN 14 10/23/2023   CREATININE 0.83 10/23/2023   EGFR 79 10/23/2023   CALCIUM 9.2 10/23/2023   PHOS 3.6 04/06/2023   PROT 6.5 10/23/2023   ALBUMIN 3.9 10/23/2023   LABGLOB 2.6 10/23/2023   AGRATIO 1.1 01/02/2023   BILITOT 0.3 10/23/2023   ALKPHOS 97 10/23/2023   AST 23 10/23/2023   ALT 12 10/23/2023   ANIONGAP 8 04/30/2023   Last lipids Lab Results  Component Value Date   CHOL 140 12/28/2022   HDL 49 12/28/2022   LDLCALC 72 12/28/2022   TRIG 107 12/28/2022   Last hemoglobin A1c Lab Results  Component Value Date   HGBA1C 8.3 (A) 07/01/2023   Last thyroid  functions Lab Results  Component Value Date   TSH 1.663 02/06/2023   Last vitamin D No results found for: MARIEN BOLLS, VD25OH Last vitamin B12 and Folate Lab Results  Component  Value Date   VITAMINB12 796 07/02/2023   FOLATE 17.2 07/02/2023      The ASCVD Risk score (Arnett DK, et al., 2019) failed to calculate for the following reasons:   Risk score cannot be calculated because patient has a medical history suggesting prior/existing ASCVD    Assessment & Plan:   Problem List Items Addressed This Visit       Respiratory   COPD exacerbation (HCC) - Primary   Insurance issues with inhaler coverage. Symbicort  effective but inconsistently covered. Wheezing and congestion with some medications. Current nebulizer outdated and malfunctioning. - Attempt prior authorization for Symbicort . - Consider Spiriva or Incruse if Symbicort  not approved. - Assist in obtaining a new nebulizer.      Relevant Medications   budesonide -formoterol  (SYMBICORT ) 160-4.5 MCG/ACT inhaler  Endocrine   Uncontrolled type 2 diabetes mellitus with hyperglycemia (HCC)   Unusual response to prednisone with decreased blood glucose levels. Average glucose 155, low of 118 on prednisone. She has upcoming appointment with endocrinology.        Musculoskeletal and Integument   RESOLVED: Rheumatoid arthritis with positive rheumatoid factor, involving unspecified site (HCC)     Other   Tobacco use disorder, continuous   Smoking cessation instruction/counseling given:  counseled patient on the dangers of tobacco use, advised patient to stop smoking, and reviewed strategies to maximize success       No follow-ups on file.    Leita Longs, FNP

## 2024-01-08 DIAGNOSIS — M059 Rheumatoid arthritis with rheumatoid factor, unspecified: Secondary | ICD-10-CM | POA: Insufficient documentation

## 2024-01-08 DIAGNOSIS — E0849 Diabetes mellitus due to underlying condition with other diabetic neurological complication: Secondary | ICD-10-CM | POA: Insufficient documentation

## 2024-01-08 NOTE — Assessment & Plan Note (Signed)
 Insurance issues with inhaler coverage. Symbicort  effective but inconsistently covered. Wheezing and congestion with some medications. Current nebulizer outdated and malfunctioning. - Attempt prior authorization for Symbicort . - Consider Spiriva or Incruse if Symbicort  not approved. - Assist in obtaining a new nebulizer.

## 2024-01-08 NOTE — Assessment & Plan Note (Signed)
 Unusual response to prednisone with decreased blood glucose levels. Average glucose 155, low of 118 on prednisone. She has upcoming appointment with endocrinology.

## 2024-01-08 NOTE — Assessment & Plan Note (Signed)
 Smoking cessation instruction/counseling given:  counseled patient on the dangers of tobacco use, advised patient to stop smoking, and reviewed strategies to maximize success

## 2024-01-09 ENCOUNTER — Ambulatory Visit

## 2024-01-12 ENCOUNTER — Inpatient Hospital Stay

## 2024-01-13 ENCOUNTER — Other Ambulatory Visit (HOSPITAL_COMMUNITY): Payer: Self-pay

## 2024-01-13 NOTE — Telephone Encounter (Signed)
 Can I have an update on how to proceed with the following PA case below?

## 2024-01-13 NOTE — Telephone Encounter (Signed)
 Her pharmacy dispensed the preferred generic which is Breyna  160-4.38mcg/act inhaler on 01/09/2024. Next refill is due on on after 01/31/2024. No further PA needed at this time.

## 2024-01-14 ENCOUNTER — Encounter (INDEPENDENT_AMBULATORY_CARE_PROVIDER_SITE_OTHER): Payer: Self-pay | Admitting: Gastroenterology

## 2024-01-16 ENCOUNTER — Inpatient Hospital Stay: Attending: Oncology

## 2024-01-16 VITALS — BP 152/59 | HR 76 | Temp 97.4°F | Resp 18

## 2024-01-16 DIAGNOSIS — Z79899 Other long term (current) drug therapy: Secondary | ICD-10-CM | POA: Insufficient documentation

## 2024-01-16 DIAGNOSIS — D500 Iron deficiency anemia secondary to blood loss (chronic): Secondary | ICD-10-CM | POA: Insufficient documentation

## 2024-01-16 DIAGNOSIS — K31811 Angiodysplasia of stomach and duodenum with bleeding: Secondary | ICD-10-CM | POA: Insufficient documentation

## 2024-01-16 MED ORDER — SODIUM CHLORIDE 0.9 % IV SOLN
510.0000 mg | Freq: Once | INTRAVENOUS | Status: AC
  Administered 2024-01-16: 510 mg via INTRAVENOUS
  Filled 2024-01-16: qty 510

## 2024-01-16 MED ORDER — SODIUM CHLORIDE 0.9 % IV SOLN: INTRAVENOUS | Status: DC

## 2024-01-16 MED ORDER — CETIRIZINE HCL 10 MG/ML IV SOLN
10.0000 mg | Freq: Once | INTRAVENOUS | Status: AC
  Administered 2024-01-16: 10 mg via INTRAVENOUS
  Filled 2024-01-16: qty 1

## 2024-01-16 MED ORDER — METHYLPREDNISOLONE SODIUM SUCC 125 MG IJ SOLR
125.0000 mg | Freq: Once | INTRAMUSCULAR | Status: AC
  Administered 2024-01-16: 125 mg via INTRAVENOUS
  Filled 2024-01-16: qty 2

## 2024-01-16 MED ORDER — FAMOTIDINE IN NACL 20-0.9 MG/50ML-% IV SOLN
20.0000 mg | Freq: Once | INTRAVENOUS | Status: AC
  Administered 2024-01-16: 20 mg via INTRAVENOUS
  Filled 2024-01-16: qty 50

## 2024-01-16 MED ORDER — ACETAMINOPHEN 325 MG PO TABS
650.0000 mg | ORAL_TABLET | Freq: Once | ORAL | Status: AC
  Administered 2024-01-16: 650 mg via ORAL
  Filled 2024-01-16: qty 2

## 2024-01-16 NOTE — Progress Notes (Signed)
 Patient tolerated iron infusion with no complaints voiced.  Peripheral IV site clean and dry with good blood return noted before and after infusion.  Band aid applied. Pt observed for 30 minutes post iron infusion without any complications.  VSS with discharge and left in satisfactory condition with no s/s of distress noted. All follow ups as scheduled.   Wendy Kramer [employer]

## 2024-01-16 NOTE — Patient Instructions (Signed)

## 2024-01-27 ENCOUNTER — Inpatient Hospital Stay

## 2024-01-27 DIAGNOSIS — K31811 Angiodysplasia of stomach and duodenum with bleeding: Secondary | ICD-10-CM | POA: Diagnosis not present

## 2024-01-27 DIAGNOSIS — Z79899 Other long term (current) drug therapy: Secondary | ICD-10-CM | POA: Diagnosis not present

## 2024-01-27 DIAGNOSIS — D500 Iron deficiency anemia secondary to blood loss (chronic): Secondary | ICD-10-CM

## 2024-01-27 LAB — COMPREHENSIVE METABOLIC PANEL WITH GFR
ALT: 24 U/L (ref 0–44)
AST: 29 U/L (ref 15–41)
Albumin: 4.1 g/dL (ref 3.5–5.0)
Alkaline Phosphatase: 89 U/L (ref 38–126)
Anion gap: 11 (ref 5–15)
BUN: 11 mg/dL (ref 8–23)
CO2: 27 mmol/L (ref 22–32)
Calcium: 9.7 mg/dL (ref 8.9–10.3)
Chloride: 103 mmol/L (ref 98–111)
Creatinine, Ser: 0.88 mg/dL (ref 0.44–1.00)
GFR, Estimated: >60 mL/min (ref >60)
Glucose, Bld: 273 mg/dL — ABNORMAL HIGH (ref 70–99)
Potassium: 5.2 mmol/L — ABNORMAL HIGH (ref 3.5–5.1)
Sodium: 141 mmol/L (ref 135–145)
Total Bilirubin: 0.4 mg/dL (ref 0.0–1.2)
Total Protein: 6.8 g/dL (ref 6.5–8.1)

## 2024-01-27 LAB — CBC WITH DIFFERENTIAL/PLATELET
Abs Immature Granulocytes: 0.01 K/uL (ref 0.00–0.07)
Basophils Absolute: 0 K/uL (ref 0.0–0.1)
Basophils Relative: 1 %
Eosinophils Absolute: 0.3 K/uL (ref 0.0–0.5)
Eosinophils Relative: 6 %
HCT: 40.6 % (ref 36.0–46.0)
Hemoglobin: 12.5 g/dL (ref 12.0–15.0)
Immature Granulocytes: 0 %
Lymphocytes Relative: 28 %
Lymphs Abs: 1.4 K/uL (ref 0.7–4.0)
MCH: 27.7 pg (ref 26.0–34.0)
MCHC: 30.8 g/dL (ref 30.0–36.0)
MCV: 90 fL (ref 80.0–100.0)
Monocytes Absolute: 0.3 K/uL (ref 0.1–1.0)
Monocytes Relative: 7 %
Neutro Abs: 2.8 K/uL (ref 1.7–7.7)
Neutrophils Relative %: 58 %
Platelets: 147 K/uL — ABNORMAL LOW (ref 150–400)
RBC: 4.51 MIL/uL (ref 3.87–5.11)
RDW: 20.2 % — ABNORMAL HIGH (ref 11.5–15.5)
WBC: 4.8 K/uL (ref 4.0–10.5)
nRBC: 0 % (ref 0.0–0.2)

## 2024-01-27 LAB — FERRITIN: Ferritin: 227 ng/mL (ref 11–307)

## 2024-01-27 LAB — IRON AND TIBC
Iron: 72 ug/dL (ref 28–170)
Saturation Ratios: 20 % (ref 10.4–31.8)
TIBC: 360 ug/dL (ref 250–450)
UIBC: 288 ug/dL

## 2024-01-27 LAB — VITAMIN B12: Vitamin B-12: 785 pg/mL (ref 180–914)

## 2024-01-27 LAB — FOLATE: Folate: 10.5 ng/mL (ref >5.9)

## 2024-01-31 ENCOUNTER — Other Ambulatory Visit: Payer: Self-pay

## 2024-02-02 NOTE — Progress Notes (Unsigned)
 Wendy Kramer 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 Kramer returns for follow-up of severe iron  deficiency anemia from blood loss.   She was last seen by Dr. Davonna on 10/20/2023.    She has not had any interim hospitalizations since her last visit.*** ***She continues to follow with GI for her GAVE with bleeding. ***Most recent IV iron  with Feraheme x 1 on 01/16/2024.  At today's visit, she reports feeling ***. She has 75***% energy and 100***% appetite.  ***She endorses that she is maintaining a stable weight. She has not had any recurrent melena or coffee ground stool in the past 2 months.  *** ***Her previously noted aching in legs has resolved. ***Dyspnea on exertion has improved. ***She denies any pica, headaches, lightheadedness, syncope, or chest pain.  ASSESSMENT & PLAN:  1.  Severe iron  deficiency anemia from blood loss: - CBC on 12/26/2022 with Hb-6.5, MCV-68.  Ferritin was 3 and percent saturation 2. - She received half unit of PRBC (September 2024) and developed hives.  Transfusion was abandoned. - Successful PRBC transfusion on 04/06/2023 with premedication. - Colonoscopy (12/27/2022): Diverticulosis of the left colon, nonbleeding internal and external hemorrhoids. - Hospitalized from 04/05/2023 through 04/08/2023 - acute on chronic iron  deficiency anemia from blood loss, with Hgb down to 6.2 on 04/06/2023.  Received 1 unit PRBC during hospitalization.  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 - Most recent EGD (06/11/2023) significant for  portal hypertensive gastropathy and GAVE s/p APC treatment. - Hematology workup (01/02/2023): Notable for severe iron  deficiency.  Hemolytic workup was negative.  Copper  and MMA levels are normal.  SPEP, free light chains and immunofixation were normal. - Monoferric  on 06/03/2023 - unable to complete infusion due to HYPERSENSITIVITY REACTION.  (Symptoms = shortness of breath, chest pressure, back pain, joint pain, nausea with dry heaving, anxiety, facial flushing.) Symptoms resolved within 20 minutes of being given IV Solu-Medrol  and IV Zofran .  Patient did continue to have some joint pain and general malaise for about 5 days after iron  infusion, and was hesitant to reschedule additional IV iron  at that time. - Has tolerated Feraheme well with PREMEDICATION (IV cetirizine , IV Pepcid , IV Solu-Medrol , oral acetaminophen ).  Most recent Feraheme x 1 on 01/16/2024. - Most recent labs (01/27/2024): Hgb 12.5/MCV 90.0, ferritin 227, iron  saturation 20% with normal TIBC.  Mild thrombocytopenia (platelets 147) likely in the setting of liver cirrhosis. Normal B12 785 normal folate 10.5. Normal creatinine 0.88 - PLAN: No indication for IV iron  at this time. - Since melena has resolved and Hgb is improving, we will hold off on weekly CBCs for the time being.  However, patient notified to alert us  if she has any worsening symptoms or any recurrence of melanotic stool. - RTC for labs (CBC/D, ferritin, iron /TIBC) + OFFICE *** PHONE?  *** visit in 3 months  2.  Social/Family History: - Lives at home with daughter and 3 grandchildren. Retired with disability, used to be a energy manager. Tobacco use of 0.5 ppd since age 52.  - No family history of anemia. Maternal aunt had breast  cancer. Maternal great-grandfather had colon cancer.   3.  Other PMH - NASH cirrhosis, GAVE with upper GI bleed, hypertension, type 2 diabetes mellitus  PLAN SUMMARY: >> Labs in 3 months = CBC/D, BB sample, ferritin, iron /TIBC >>  OFFICE*** visit in 3 months     REVIEW OF SYSTEMS: ***  Review of Systems  Constitutional:  Negative for appetite change, chills, diaphoresis, fatigue, fever and unexpected weight change.  HENT:   Negative for lump/mass and nosebleeds.   Eyes:  Negative for eye problems.  Respiratory:  Negative for cough, hemoptysis and shortness of breath.   Cardiovascular:  Negative for chest pain, leg swelling and palpitations.  Gastrointestinal:  Negative for abdominal pain, blood in stool, constipation, diarrhea, nausea and vomiting.  Genitourinary:  Negative for hematuria.   Musculoskeletal:  Positive for arthralgias.  Skin: Negative.   Neurological:  Positive for dizziness. Negative for headaches and light-headedness.  Hematological:  Does not bruise/bleed easily.     PHYSICAL EXAM:***  ECOG PERFORMANCE STATUS: 1 - Symptomatic but completely ambulatory  There were no vitals filed for this visit.   There were no vitals filed for this visit.   Physical Exam Constitutional:      Appearance: Normal appearance. She is obese.  Cardiovascular:     Heart sounds: Murmur (PFO, per patient) heard.  Pulmonary:     Breath sounds: Normal breath sounds.  Neurological:     General: No focal deficit present.     Mental Status: Mental status is at baseline.  Psychiatric:        Behavior: Behavior normal. Behavior is cooperative.    PAST MEDICAL/SURGICAL HISTORY:  Past Medical History:  Diagnosis Date   Arthritis    Cardiac murmur    Cervical spine disease    Cirrhosis (HCC)    likely secondary to NASH   Diabetes mellitus without complication (HCC)    diet controlled   Dysrhythmia    Exogenous hyperlipidemia    GERD (gastroesophageal reflux disease)    History of kidney stones    HTN (hypertension)    Morbid obesity due to excess calories (HCC)    Other chronic pain    Panic disorder    Patent foramen ovale    TEE 2018   Prediabetes    Rheumatoid arthritis with positive rheumatoid  factor, involving unspecified site (HCC) 01/08/2024   Sleep apnea    Stroke (HCC)    mini-stroke no deficits from this.   Unspecified asthma with (acute) exacerbation    Vitamin D deficiency    Past Surgical History:  Procedure Laterality Date   BIOPSY  03/05/2018   Procedure: BIOPSY;  Surgeon: Shaaron Lamar HERO, MD;  [facility];  Service: Endoscopy;;  gastric   CHOLECYSTECTOMY     COLONOSCOPY WITH PROPOFOL  N/A 03/05/2018   Dr. Shaaron: Diverticulosis in the sigmoid and descending colon.  3 colon polyps removed, 2 hyperplastic and one sessile serrated polyp.  Next colonoscopy 3 years.   COLONOSCOPY WITH PROPOFOL  N/A 12/27/2022   Procedure: COLONOSCOPY WITH PROPOFOL ;  Surgeon: Cinderella Deatrice FALCON, MD;  [facility]: [facility];  Service: Endoscopy;  Laterality: N/A;   ESOPHAGOGASTRODUODENOSCOPY (EGD) WITH PROPOFOL  N/A 03/05/2018   Dr. Shaaron: Erosive reflux esophagitis, small hiatal hernia, erosive gastropathy with reactive gastropathy on biopsy.  No H. pylori.   ESOPHAGOGASTRODUODENOSCOPY (EGD) WITH PROPOFOL  N/A 12/27/2022   Procedure: ESOPHAGOGASTRODUODENOSCOPY (EGD) WITH PROPOFOL ;  Surgeon: Cinderella Deatrice FALCON, MD;  [facility];  Service: Endoscopy;  Laterality: N/A;  ESOPHAGOGASTRODUODENOSCOPY (EGD) WITH PROPOFOL  N/A 04/08/2023   Procedure: ESOPHAGOGASTRODUODENOSCOPY (EGD) WITH PROPOFOL ;  Surgeon: Eartha Angelia Sieving, MD;  [facility]: [facility];  Service: Gastroenterology;  Laterality: N/A;   ESOPHAGOGASTRODUODENOSCOPY (EGD) WITH PROPOFOL  N/A 06/11/2023   Procedure: ESOPHAGOGASTRODUODENOSCOPY (EGD) WITH PROPOFOL ;  Surgeon: Shaaron Lamar HERO, MD;  [facility]: [facility];  Service: Endoscopy;  Laterality: N/A;  900am, asa 3   HERNIA REPAIR     umbilical   HOT HEMOSTASIS  12/27/2022   Procedure: HOT HEMOSTASIS (ARGON PLASMA COAGULATION/BICAP);  Surgeon: Cinderella Deatrice FALCON, MD;  [facility]: [facility];  Service: Endoscopy;;   HOT HEMOSTASIS  06/11/2023   Procedure: EGD,  WITH ARGON PLASMA COAGULATION;  Surgeon: Shaaron Lamar HERO, MD;  [facility]: [facility];  Service: Endoscopy;;   POLYPECTOMY  03/05/2018   Procedure: POLYPECTOMY;  Surgeon: Shaaron Lamar HERO, MD;  [facility];  Service: Endoscopy;;  colon   SHOULDER ARTHROSCOPY Right    TEE WITHOUT CARDIOVERSION  2018   patent foramen ovale   TONSILLECTOMY AND ADENOIDECTOMY     TUBAL LIGATION      SOCIAL HISTORY:  Social History   Socioeconomic History   Marital status: Legally Separated    Spouse name: Not on file   Number of children: Not on file   Years of education: Not on file   Highest education level: GED or equivalent  Occupational History   Not on file  Tobacco Use   Smoking status: Every Day    Current packs/day: 0.50    Average packs/day: 0.5 packs/day for 40.0 years (20.0 ttl pk-yrs)    Types: Cigarettes   Smokeless tobacco: Never  Vaping Use   Vaping status: Never Used  Substance and Sexual Activity   Alcohol  use: No   Drug use: No   Sexual activity: Not Currently    Birth control/protection: Post-menopausal, Surgical    Comment: tubal  Other Topics Concern   Not on file  Social History Narrative   Not on file   Social Drivers of Health   Financial Resource Strain: Low Risk  (07/16/2023)   Overall Financial Resource Strain (CARDIA)    Difficulty of Paying Living Expenses: Not very hard  Food Insecurity: Patient Declined (07/16/2023)   Hunger Vital Sign    Worried About Running Out of Food in the Last Year: Patient declined    Ran Out of Food in the Last Year: Patient declined  Transportation Needs: No Transportation Needs (07/16/2023)   PRAPARE - Administrator, Civil Service (Medical): No    Lack of Transportation (Non-Medical): No  Physical Activity: Inactive (07/16/2023)   Exercise Vital Sign    Days of Exercise per Week: 0 days    Minutes of Exercise per Session: 30 min  Stress: No Stress Concern Present (07/16/2023)   [employer] of  Occupational Health - Occupational Stress Questionnaire    Feeling of Stress : Only a little  Social Connections: Socially Isolated (07/16/2023)   Social Connection and Isolation Panel    Frequency of Communication with Friends and Family: More than three times a week    Frequency of Social Gatherings with Friends and Family: More than three times a week    Attends Religious Services: Never    Database Administrator or Organizations: No    Attends Banker Meetings: Never    Marital Status: Separated  Intimate Partner Violence: Not At Risk (04/10/2023)   Humiliation, Afraid, Rape, and Kick questionnaire  Fear of Current or Ex-Partner: No    Emotionally Abused: No    Physically Abused: No    Sexually Abused: No    FAMILY HISTORY:  Family History  Problem Relation Age of Onset   Breast cancer Maternal Aunt    Colon cancer Other        maternal great grandfather   Liver disease Brother        Fatty liver   HIV Brother    Hypertension Mother    Hyperlipidemia Mother    Hypertension Father    Thyroid  disease Sister    Pancreatic cancer Sister    Healthy Daughter    Healthy Son    Healthy Son    Heart disease Son     CURRENT MEDICATIONS:  Outpatient Encounter Medications as of 02/03/2024  Medication Sig Note   Accu-Chek Softclix Lancets lancets Use as instructed to monitor glucose 1 times daily.    acetaminophen  (TYLENOL ) 500 MG tablet Take 1,000 mg by mouth every 6 (six) hours as needed for moderate pain (pain score 4-6).    albuterol  (PROVENTIL  HFA;VENTOLIN  HFA) 108 (90 Base) MCG/ACT inhaler Inhale 1 puff into the lungs every 6 (six) hours as needed for wheezing or shortness of breath.    ALPRAZolam  (XANAX ) 0.25 MG tablet TAKE 1 TABLET(0.25 MG) BY MOUTH TWICE DAILY AS NEEDED FOR ANXIETY    Blood Glucose Monitoring Suppl (ACCU-CHEK GUIDE ME) w/Device KIT 1 Piece by Does not apply route as directed.    budesonide -formoterol  (SYMBICORT ) 160-4.5 MCG/ACT inhaler Inhale  2 puffs into the lungs 2 (two) times daily.    fluticasone  (FLONASE ) 50 MCG/ACT nasal spray Place 2 sprays into both nostrils daily as needed for allergies or rhinitis.    glucose blood (ACCU-CHEK GUIDE) test strip USE TO CHECK BLOOD SUGARS ONCE DAILY    lisinopril (ZESTRIL) 20 MG tablet TAKE 1 TABLET BY MOUTH TWICE DAILY    metFORMIN  (GLUCOPHAGE ) 500 MG tablet Take 1 tablet (500 mg total) by mouth 2 (two) times daily with a meal.    nystatin  (MYCOSTATIN /NYSTOP ) powder Apply 1 Application topically 3 (three) times daily.    pantoprazole  (PROTONIX ) 40 MG tablet Take 1 tablet (40 mg total) by mouth 2 (two) times daily before a meal.    predniSONE (STERAPRED UNI-PAK 21 TAB) 10 MG (21) TBPK tablet Take according to package instructions    traMADol  (ULTRAM ) 50 MG tablet Take 2 tablets (100 mg total) by mouth every 8 (eight) hours as needed.    [DISCONTINUED] lisinopril (ZESTRIL) 20 MG tablet Take 10 mg by mouth every evening. 04/10/2023: See below, patient was splitting in two as BP was running low. Will speak to provider at next visit. Monitors BP at home.    [DISCONTINUED] traMADol  (ULTRAM ) 50 MG tablet Take 100 mg by mouth every 8 (eight) hours as needed for moderate pain (pain score 4-6).    No facility-administered encounter medications on file as of 02/03/2024.    ALLERGIES:  Allergies  Allergen Reactions   Asa [Aspirin] Anaphylaxis    Tolerates other NSAIDS   Augmentin [Amoxicillin-Pot Clavulanate] Nausea And Vomiting and Other (See Comments)    Syncope   Ferric Derisomaltose  Shortness Of Breath, Nausea Only, Swelling and Other (See Comments)    Chest tightness, back and joint pain    Trelegy Ellipta [Fluticasone -Umeclidin-Vilant] Anxiety   Levaquin [Levofloxacin In D5w] Itching and Other (See Comments)    body turned red   Lovenox [Enoxaparin Sodium] Other (See Comments)    physician instructed  not to use ever again. white veins popped out   Whole Blood Palpitations and Rash     LABORATORY DATA:  I have reviewed the labs as listed.  CBC    Component Value Date/Time   WBC 4.8 01/27/2024 1024   RBC 4.51 01/27/2024 1024   HGB 12.5 01/27/2024 1024   HGB 11.0 (L) 09/03/2023 0921   HCT 40.6 01/27/2024 1024   HCT 37.9 09/03/2023 0921   PLT 147 (L) 01/27/2024 1024   PLT 210 09/03/2023 0921   MCV 90.0 01/27/2024 1024   MCV 86 09/03/2023 0921   MCH 27.7 01/27/2024 1024   MCHC 30.8 01/27/2024 1024   RDW 20.2 (H) 01/27/2024 1024   RDW 23.2 (H) 09/03/2023 0921   LYMPHSABS 1.4 01/27/2024 1024   LYMPHSABS 1.5 09/03/2023 0921   MONOABS 0.3 01/27/2024 1024   EOSABS 0.3 01/27/2024 1024   EOSABS 0.4 09/03/2023 0921   BASOSABS 0.0 01/27/2024 1024   BASOSABS 0.0 09/03/2023 0921      Latest Ref Rng & Units 01/27/2024   10:24 AM 10/23/2023    9:33 AM 08/15/2023    9:54 AM  CMP  Glucose 70 - 99 mg/dL 726  811  834   BUN 8 - 23 mg/dL 11  14  13    Creatinine 0.44 - 1.00 mg/dL 9.11  9.16  9.17   Sodium 135 - 145 mmol/L 141  140  139   Potassium 3.5 - 5.1 mmol/L 5.2  4.8  5.2   Chloride 98 - 111 mmol/L 103  103  102   CO2 22 - 32 mmol/L 27  21  21    Calcium 8.9 - 10.3 mg/dL 9.7  9.2  9.3   Total Protein 6.5 - 8.1 g/dL 6.8  6.5    Total Bilirubin 0.0 - 1.2 mg/dL 0.4  0.3    Alkaline Phos 38 - 126 U/L 89  97    AST 15 - 41 U/L 29  23    ALT 0 - 44 U/L 24  12      DIAGNOSTIC IMAGING:  I have independently reviewed the relevant imaging and discussed with the patient.   WRAP UP:  All questions were answered. The patient knows to call the clinic with any problems, questions or concerns.  Medical decision making: Moderate  Time spent on visit: I spent 20 minutes counseling the patient face to face. The total time spent in the appointment was 30 minutes and more than 50% was on counseling.  Pleasant Wendy Barefoot, PA-C  ***

## 2024-02-03 ENCOUNTER — Inpatient Hospital Stay: Attending: Oncology | Admitting: Physician Assistant

## 2024-02-03 VITALS — BP 156/74 | HR 88 | Temp 98.4°F | Resp 18 | Wt 226.0 lb

## 2024-02-03 DIAGNOSIS — Z800 Family history of malignant neoplasm of digestive organs: Secondary | ICD-10-CM | POA: Diagnosis not present

## 2024-02-03 DIAGNOSIS — K31811 Angiodysplasia of stomach and duodenum with bleeding: Secondary | ICD-10-CM | POA: Diagnosis not present

## 2024-02-03 DIAGNOSIS — D696 Thrombocytopenia, unspecified: Secondary | ICD-10-CM | POA: Insufficient documentation

## 2024-02-03 DIAGNOSIS — Z79899 Other long term (current) drug therapy: Secondary | ICD-10-CM | POA: Insufficient documentation

## 2024-02-03 DIAGNOSIS — D500 Iron deficiency anemia secondary to blood loss (chronic): Secondary | ICD-10-CM | POA: Diagnosis not present

## 2024-02-03 DIAGNOSIS — F17210 Nicotine dependence, cigarettes, uncomplicated: Secondary | ICD-10-CM | POA: Diagnosis not present

## 2024-02-03 DIAGNOSIS — Z803 Family history of malignant neoplasm of breast: Secondary | ICD-10-CM | POA: Insufficient documentation

## 2024-02-03 NOTE — Patient Instructions (Signed)
 [facility] at [facility] **VISIT SUMMARY & IMPORTANT INSTRUCTIONS **   You were seen today by Pleasant Barefoot PA-C for your iron  deficiency anemia.   Your blood and iron  levels look great!  You do not need any IV iron  right now. We will recheck your levels in 3 months, but please contact us  sooner if you have any recurrent bleeding or symptoms of anemia.  FOLLOW-UP APPOINTMENT: 3 months  ** Thank you for trusting me with your healthcare!  I strive to provide all of my patients with quality care at each visit.  If you receive a survey for this visit, I would be so grateful to you for taking the time to provide feedback.  Thank you in advance!  ~ Katrina Brosh                                        Dr. Mickiel Davonna Pleasant Barefoot, PA-C     Delon Hope, NP   - - - - - - - - - - - - - - - - - -    Thank you for choosing [facility] at [facility] to provide your oncology and hematology care.  To afford each patient quality time with our provider, please arrive at least 15 minutes before your scheduled appointment time.   If you have a lab appointment with the Cancer Center please come in thru the Main Entrance and check in at the main information desk.  You need to re-schedule your appointment should you arrive 10 or more minutes late.  We strive to give you quality time with our providers, and arriving late affects you and other patients whose appointments are after yours.  Also, if you no show three or more times for appointments you may be dismissed from the clinic at the providers discretion.     Again, thank you for choosing [facility].  Our hope is that these requests will decrease the amount of time that you wait before being seen by our physicians.       _____________________________________________________________  Should you have questions after your visit to [facility], please contact our office at  [phone] and follow the prompts.  Our office hours are 8:00 a.m. and 4:30 p.m. Monday - Friday.  Please note that voicemails left after 4:00 p.m. may not be returned until the following business day.  We are closed weekends and major holidays.  You do have access to a nurse 24-7, just call the main number to the clinic [phone] and do not press any options, hold on the line and a nurse will answer the phone.    For prescription refill requests, have your pharmacy contact our office and allow 72 hours.

## 2024-02-12 ENCOUNTER — Ambulatory Visit

## 2024-02-18 ENCOUNTER — Other Ambulatory Visit: Payer: Self-pay

## 2024-02-18 ENCOUNTER — Encounter: Payer: Self-pay | Admitting: Gastroenterology

## 2024-02-18 DIAGNOSIS — F411 Generalized anxiety disorder: Secondary | ICD-10-CM

## 2024-03-02 ENCOUNTER — Ambulatory Visit: Admitting: Nurse Practitioner

## 2024-03-02 ENCOUNTER — Encounter: Payer: Self-pay | Admitting: Nurse Practitioner

## 2024-03-02 VITALS — BP 126/78 | HR 92 | Ht 63.0 in | Wt 229.8 lb

## 2024-03-02 DIAGNOSIS — E782 Mixed hyperlipidemia: Secondary | ICD-10-CM | POA: Diagnosis not present

## 2024-03-02 DIAGNOSIS — Z7984 Long term (current) use of oral hypoglycemic drugs: Secondary | ICD-10-CM

## 2024-03-02 DIAGNOSIS — E1165 Type 2 diabetes mellitus with hyperglycemia: Secondary | ICD-10-CM

## 2024-03-02 DIAGNOSIS — E559 Vitamin D deficiency, unspecified: Secondary | ICD-10-CM

## 2024-03-02 LAB — POCT GLYCOSYLATED HEMOGLOBIN (HGB A1C): Hemoglobin A1C: 10.1 % — AB (ref 4.0–5.6)

## 2024-03-02 MED ORDER — GLIPIZIDE ER 5 MG PO TB24: 5.0000 mg | ORAL_TABLET | Freq: Every day | ORAL | 1 refills | Status: DC

## 2024-03-02 NOTE — Progress Notes (Signed)
 03/02/2024, 11:39 AM   Endocrinology follow-up note  Subjective:    Patient ID: Wendy Kramer, female    DOB: [birth year].  Wendy Kramer is being seen in follow-up after she was seen in consultation for management of currently uncontrolled symptomatic diabetes requested by  Bevely Doffing, FNP.   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   Unspecified asthma with (acute) exacerbation    Vitamin D deficiency     Past Surgical History:  Procedure Laterality Date   BIOPSY  03/05/2018   Procedure: BIOPSY;  Surgeon: Shaaron Lamar HERO, MD;  Location: [facility];  Service: Endoscopy;;  gastric   CHOLECYSTECTOMY     COLONOSCOPY WITH PROPOFOL  N/A 03/05/2018   Dr. Shaaron: Diverticulosis in the sigmoid and descending colon.  3 colon polyps removed, 2 hyperplastic and one sessile serrated polyp.  Next colonoscopy 3 years.   COLONOSCOPY WITH PROPOFOL  N/A 12/27/2022   Procedure: COLONOSCOPY WITH PROPOFOL ;  Surgeon: Cinderella Deatrice FALCON, MD;  Location: [facility];  Service: Endoscopy;  Laterality: N/A;   ESOPHAGOGASTRODUODENOSCOPY (EGD) WITH PROPOFOL  N/A 03/05/2018   Dr. Shaaron: Erosive reflux esophagitis, small hiatal hernia, erosive gastropathy with reactive gastropathy on biopsy.  No H. pylori.    ESOPHAGOGASTRODUODENOSCOPY (EGD) WITH PROPOFOL  N/A 12/27/2022   Procedure: ESOPHAGOGASTRODUODENOSCOPY (EGD) WITH PROPOFOL ;  Surgeon: Cinderella Deatrice FALCON, MD;  [facility]: [facility];  Service: Endoscopy;  Laterality: N/A;   ESOPHAGOGASTRODUODENOSCOPY (EGD) WITH PROPOFOL  N/A 04/08/2023   Procedure: ESOPHAGOGASTRODUODENOSCOPY (EGD) WITH PROPOFOL ;  Surgeon: Eartha Angelia Sieving, MD;  Location: [facility];  Service: Gastroenterology;  Laterality: N/A;   ESOPHAGOGASTRODUODENOSCOPY (EGD) WITH PROPOFOL  N/A 06/11/2023   Procedure: ESOPHAGOGASTRODUODENOSCOPY (EGD) WITH PROPOFOL ;  Surgeon: Shaaron Lamar HERO, MD;  [facility]: [facility];  Service: Endoscopy;  Laterality: N/A;  900am, asa 3   HERNIA REPAIR     umbilical   HOT HEMOSTASIS  12/27/2022   Procedure: HOT HEMOSTASIS (ARGON PLASMA COAGULATION/BICAP);  Surgeon: Cinderella Deatrice FALCON, MD;  [facility]: [facility];  Service: Endoscopy;;   HOT HEMOSTASIS  06/11/2023   Procedure: EGD, WITH ARGON PLASMA COAGULATION;  Surgeon: Shaaron Lamar HERO, MD;  [facility]: [facility];  Service: Endoscopy;;   POLYPECTOMY  03/05/2018   Procedure: POLYPECTOMY;  Surgeon: Shaaron Lamar HERO, MD;  [facility]: [facility];  Service: Endoscopy;;  colon   SHOULDER ARTHROSCOPY Right  TEE WITHOUT CARDIOVERSION  2018   patent foramen ovale   TONSILLECTOMY AND ADENOIDECTOMY     TUBAL LIGATION      Social History   Socioeconomic History   Marital status: Legally Separated    Spouse name: Not on file   Number of children: Not on file   Years of education: Not on file   Highest education level: GED or equivalent  Occupational History   Not on file  Tobacco Use   Smoking status: Every Day    Current packs/day: 0.50    Average packs/day: 0.5 packs/day for 40.0 years (20.0 ttl pk-yrs)    Types: Cigarettes   Smokeless tobacco: Never  Vaping Use   Vaping status: Never Used  Substance and Sexual Activity   Alcohol  use: No   Drug use: No   Sexual activity: Not Currently     Birth control/protection: Post-menopausal, Surgical    Comment: tubal  Other Topics Concern   Not on file  Social History Narrative   Not on file   Social Drivers of Health   Financial Resource Strain: Low Risk  (07/16/2023)   Overall Financial Resource Strain (CARDIA)    Difficulty of Paying Living Expenses: Not very hard  Food Insecurity: Patient Declined (07/16/2023)   Hunger Vital Sign    Worried About Running Out of Food in the Last Year: Patient declined    Ran Out of Food in the Last Year: Patient declined  Transportation Needs: No Transportation Needs (07/16/2023)   PRAPARE - Administrator, Civil Service (Medical): No    Lack of Transportation (Non-Medical): No  Physical Activity: Inactive (07/16/2023)   Exercise Vital Sign    Days of Exercise per Week: 0 days    Minutes of Exercise per Session: 30 min  Stress: No Stress Concern Present (07/16/2023)   [employer] of Occupational Health - Occupational Stress Questionnaire    Feeling of Stress : Only a little  Social Connections: Socially Isolated (07/16/2023)   Social Connection and Isolation Panel    Frequency of Communication with Friends and Family: More than three times a week    Frequency of Social Gatherings with Friends and Family: More than three times a week    Attends Religious Services: Never    Database Administrator or Organizations: No    Attends Engineer, Structural: Never    Marital Status: Separated    Family History  Problem Relation Age of Onset   Breast cancer Maternal Aunt    Colon cancer Other        maternal great grandfather   Liver disease Brother        Fatty liver   HIV Brother    Hypertension Mother    Hyperlipidemia Mother    Hypertension Father    Thyroid  disease Sister    Pancreatic cancer Sister    Healthy Daughter    Healthy Son    Healthy Son    Heart disease Son     Outpatient Encounter Medications as of 03/02/2024  Medication Sig   Accu-Chek  Softclix Lancets lancets Use as instructed to monitor glucose 1 times daily.   acetaminophen  (TYLENOL ) 500 MG tablet Take 1,000 mg by mouth every 6 (six) hours as needed for moderate pain (pain score 4-6).   albuterol  (PROVENTIL  HFA;VENTOLIN  HFA) 108 (90 Base) MCG/ACT inhaler Inhale 1 puff into the lungs every 6 (six) hours as needed for wheezing or shortness of breath.   ALPRAZolam  (XANAX ) 0.25 MG  tablet TAKE 1 TABLET(0.25 MG) BY MOUTH TWICE DAILY AS NEEDED FOR ANXIETY   Blood Glucose Monitoring Suppl (ACCU-CHEK GUIDE ME) w/Device KIT 1 Piece by Does not apply route as directed.   budesonide -formoterol  (SYMBICORT ) 160-4.5 MCG/ACT inhaler Inhale 2 puffs into the lungs 2 (two) times daily.   fluticasone  (FLONASE ) 50 MCG/ACT nasal spray Place 2 sprays into both nostrils daily as needed for allergies or rhinitis.   glipiZIDE (GLUCOTROL XL) 5 MG 24 hr tablet Take 1 tablet (5 mg total) by mouth daily with breakfast.   glucose blood (ACCU-CHEK GUIDE) test strip USE TO CHECK BLOOD SUGARS ONCE DAILY   lisinopril (ZESTRIL) 20 MG tablet TAKE 1 TABLET BY MOUTH TWICE DAILY   metFORMIN  (GLUCOPHAGE ) 500 MG tablet Take 1 tablet (500 mg total) by mouth 2 (two) times daily with a meal.   nystatin  (MYCOSTATIN /NYSTOP ) powder Apply 1 Application topically 3 (three) times daily.   pantoprazole  (PROTONIX ) 40 MG tablet Take 1 tablet (40 mg total) by mouth 2 (two) times daily before a meal. (Patient taking differently: Take 40 mg by mouth 2 (two) times daily before a meal. Once a day)   traMADol  (ULTRAM ) 50 MG tablet Take 2 tablets (100 mg total) by mouth every 8 (eight) hours as needed.   No facility-administered encounter medications on file as of 03/02/2024.    ALLERGIES: Allergies  Allergen Reactions   Asa [Aspirin] Anaphylaxis    Tolerates other NSAIDS   Augmentin [Amoxicillin-Pot Clavulanate] Nausea And Vomiting and Other (See Comments)    Syncope   Ferric Derisomaltose  Shortness Of Breath, Nausea Only,  Swelling and Other (See Comments)    Chest tightness, back and joint pain    Trelegy Ellipta [Fluticasone -Umeclidin-Vilant] Anxiety   Levaquin [Levofloxacin In D5w] Itching and Other (See Comments)    body turned red   Lovenox [Enoxaparin Sodium] Other (See Comments)    physician instructed not to use ever again. white veins popped out   Whole Blood Palpitations and Rash    VACCINATION STATUS: There is no immunization history for the selected administration types on file for this patient.  Diabetes She presents for her follow-up diabetic visit. She has type 2 diabetes mellitus. Onset time: She was diagnosed at approximate age of 33 years, after several years of prediabetes. Her disease course has been worsening. There are no hypoglycemic associated symptoms. Pertinent negatives for hypoglycemia include no confusion, pallor or seizures. Associated symptoms include foot paresthesias. Pertinent negatives for diabetes include no fatigue, no polydipsia, no polyphagia and no polyuria. There are no hypoglycemic complications. Symptoms are stable. Risk factors for coronary artery disease include diabetes mellitus, dyslipidemia, family history, obesity, tobacco exposure, post-menopausal, sedentary lifestyle and hypertension. Current diabetic treatment includes oral agent (monotherapy). She is compliant with treatment most of the time. Her weight is increasing steadily. She is following a generally unhealthy diet. When asked about meal planning, she reported none. She has not had a previous visit with a dietitian. She never participates in exercise. (She presents today with no meter or logs to review.  Her POCT A1c today is 10.1%, increasing from last visit of 9.1%.   She admits she has not been eating very healthy recently. ) An ACE inhibitor/angiotensin II receptor blocker is being taken. She does not see a podiatrist.Eye exam is current.    Review of systems  Constitutional: + increasing body weight,   current Body mass index is 40.71 kg/m. , no fatigue, no subjective hyperthermia, no subjective hypothermia Eyes: no blurry vision, no  xerophthalmia ENT: no sore throat, no nodules palpated in throat, no dysphagia/odynophagia, no hoarseness Cardiovascular: no chest pain, no shortness of breath, no palpitations, no leg swelling Respiratory: no cough, no shortness of breath Gastrointestinal: no nausea/vomiting/diarrhea Musculoskeletal: no muscle/joint aches Skin: no rashes, no hyperemia Neurological: no tremors, + numbness/ tingling to BLE- taking OTC Magnesium  which has helped, no dizziness Psychiatric: no depression, no anxiety   Objective:    BP 126/78 (BP Location: Left Arm, Patient Position: Sitting, Cuff Size: Large)   Pulse 92   Ht 5' 3 (1.6 m)   Wt 229 lb 12.8 oz (104.2 kg)   LMP 09/06/2016   BMI 40.71 kg/m   Wt Readings from Last 3 Encounters:  03/02/24 229 lb 12.8 oz (104.2 kg)  02/03/24 226 lb (102.5 kg)  01/05/24 224 lb 1.3 oz (101.6 kg)   BP Readings from Last 3 Encounters:  03/02/24 126/78  02/03/24 (!) 156/74  01/16/24 (!) 152/59     Physical Exam- Limited  Constitutional:  Body mass index is 40.71 kg/m. , not in acute distress, normal state of mind Eyes:  EOMI, no exophthalmos Musculoskeletal: no gross deformities, strength intact in all four extremities, no gross restriction of joint movements Skin:  no rashes, no hyperemia Neurological: no tremor with outstretched hands   Diabetic Foot Exam - Simple   Simple Foot Form Diabetic Foot exam was performed with the following findings: Yes   Visual Inspection No deformities, no ulcerations, no other skin breakdown bilaterally: Yes See comments: Yes Sensation Testing Intact to touch and monofilament testing bilaterally: Yes Pulse Check Posterior Tibialis and Dorsalis pulse intact bilaterally: Yes Comments Varicose veins to bilateral feet     CMP ( most recent) CMP     Component Value Date/Time    NA 141 01/27/2024 1024   NA 140 10/23/2023 0933   K 5.2 (H) 01/27/2024 1024   CL 103 01/27/2024 1024   CO2 27 01/27/2024 1024   GLUCOSE 273 (H) 01/27/2024 1024   BUN 11 01/27/2024 1024   BUN 14 10/23/2023 0933   CREATININE 0.88 01/27/2024 1024   CREATININE 0.67 12/25/2022 0759   CALCIUM 9.7 01/27/2024 1024   PROT 6.8 01/27/2024 1024   PROT 6.5 10/23/2023 0933   ALBUMIN 4.1 01/27/2024 1024   ALBUMIN 3.9 10/23/2023 0933   AST 29 01/27/2024 1024   ALT 24 01/27/2024 1024   ALKPHOS 89 01/27/2024 1024   BILITOT 0.4 01/27/2024 1024   BILITOT 0.3 10/23/2023 0933   GFRNONAA >60 01/27/2024 1024   GFRAA 85 02/15/2020 1018     Diabetic Labs (most recent): Lab Results  Component Value Date   HGBA1C 10.1 (A) 03/02/2024   HGBA1C 8.3 (A) 07/01/2023   HGBA1C 9.4 (H) 12/26/2022   MICROALBUR 80 02/09/2020     Lab Results  Component Value Date   TSH 1.663 02/06/2023   TSH 1.470 01/03/2023   TSH 1.56 05/01/2022   TSH 1.85 07/23/2019   FREET4 1.46 01/03/2023   FREET4 1.2 05/01/2022      Assessment & Plan:   1) Uncontrolled type 2 diabetes mellitus with hyperglycemia (HCC)  - Carlon Jagiello has currently uncontrolled symptomatic type 2 DM since  63 years of age.  She presents today with no meter or logs to review.  Her POCT A1c today is 10.1%, increasing from last visit of 9.1%.   She admits she has not been eating very healthy recently.   - I had a long discussion with her about the progressive nature  of diabetes and the pathology behind its complications. -her diabetes is complicated by obesity/sedentary life, active smoking and she remains at a high risk for more acute and chronic complications which include CAD, CVA, CKD, retinopathy, and neuropathy. These are all discussed in detail with her.  The following Lifestyle Medicine recommendations according to American College of Lifestyle Medicine [facility]) were discussed and offered to patient and she agrees to start the journey:  A.  Whole Foods, Plant-based plate comprising of fruits and vegetables, plant-based proteins, whole-grain carbohydrates was discussed in detail with the patient.   A list for source of those nutrients were also provided to the patient.  Patient will use only water  or unsweetened tea for hydration. B.  The need to stay away from risky substances including alcohol , smoking; obtaining 7 to 9 hours of restorative sleep, at least 150 minutes of moderate intensity exercise weekly, the importance of healthy social connections,  and stress reduction techniques were discussed. C.  A full color page of  Calorie density of various food groups per pound showing examples of each food groups was provided to the patient.  - Nutritional counseling repeated/built upon at each appointment.  - The patient admits there is a room for improvement in their diet and drink choices. -  Suggestion is made for the patient to avoid simple carbohydrates from their diet including Cakes, Sweet Desserts / Pastries, Ice Cream, Soda (diet and regular), Sweet Tea, Candies, Chips, Cookies, Sweet Pastries, Store Bought Juices, Alcohol  in Excess of 1-2 drinks a day, Artificial Sweeteners, Coffee Creamer, and Sugar-free Products. This will help patient to have stable blood glucose profile and potentially avoid unintended weight gain.   - I encouraged the patient to switch to unprocessed or minimally processed complex starch and increased protein intake (animal or plant source), fruits, and vegetables.   - Patient is advised to stick to a routine mealtimes to eat 3 meals a day and avoid unnecessary snacks (to snack only to correct hypoglycemia).  - she will be scheduled with Penny Crumpton, RDN, CDE for diabetes education.  - I have approached her with the following individualized plan to manage  her diabetes and patient agrees:   -She is advised to continue Metformin  500 mg po twice daily with meals.  Will trial her on low dose Glipizide 5  mg XL daily at breakfast to help her get glucose back on track.  She is encouraged to start monitoring glucose once daily, before breakfast to help her regain control of her diabetes.   - Specific targets for  A1c;  LDL, HDL,  and Triglycerides were discussed with the patient.  2) Blood Pressure /Hypertension:   Her blood pressure is controlled to target.  She is advised to continue medications as prescribed by PCP.  3) Lipids/Hyperlipidemia:  Her recent lipid panel from 12/28/22 shows controlled LDL of 72.  She is not currently on any lipid lowering agents. medications.    4) Weight/Diet:  Her Body mass index is 40.71 kg/m.  -   clearly complicating her diabetes care.   she is  a candidate for weight loss. I discussed with her the fact that loss of 5 - 10% of her  current body weight will have the most impact on her diabetes management.  Exercise, and detailed carbohydrates information provided  -  detailed on discharge instructions.  5) Chronic Care/Health Maintenance: -she is on ACEI medications and is encouraged to initiate and continue to follow up with Ophthalmology, Dentist,  Podiatrist  at least yearly or according to recommendations, and advised to  Quit smoking. I have recommended yearly flu vaccine and pneumonia vaccine at least every 5 years; moderate intensity exercise for up to 150 minutes weekly; and  sleep for at least 7 hours a day.  - she is advised to maintain close follow up with Bevely Doffing, FNP for primary care needs, as well as her other providers for optimal and coordinated care.     I spent  44  minutes in the care of the patient today including review of labs from CMP, Lipids, Thyroid  Function, Hematology (current and previous including abstractions from other facilities); face-to-face time discussing  her blood glucose readings/logs, discussing hypoglycemia and hyperglycemia episodes and symptoms, medications doses, her options of short and long term treatment  based on the latest standards of care / guidelines;  discussion about incorporating lifestyle medicine;  and documenting the encounter. Risk reduction counseling performed per USPSTF guidelines to reduce obesity and cardiovascular risk factors.     Please refer to Patient Instructions for Blood Glucose Monitoring and Insulin /Medications Dosing Guide  in media tab for additional information. Please  also refer to  Patient Self Inventory in the Media  tab for reviewed elements of pertinent patient history.  Vallie Calvert participated in the discussions, expressed understanding, and voiced agreement with the above plans.  All questions were answered to her satisfaction. she is encouraged to contact clinic should she have any questions or concerns prior to her return visit.   Follow up plan: - Return in about 4 months (around 07/01/2024) for Diabetes F/U with A1c in office, No previsit labs, Bring meter and logs.  Benton Rio, [facility] [address] [place], [place] [address] Phone: [phone] Fax: [phone]  03/02/2024, 11:39 AM

## 2024-03-29 ENCOUNTER — Encounter: Payer: Self-pay | Admitting: *Deleted

## 2024-03-31 ENCOUNTER — Other Ambulatory Visit: Payer: Self-pay

## 2024-04-02 ENCOUNTER — Ambulatory Visit

## 2024-04-02 VITALS — BP 120/64 | HR 92 | Resp 12 | Ht 63.0 in | Wt 227.6 lb

## 2024-04-02 DIAGNOSIS — Z1231 Encounter for screening mammogram for malignant neoplasm of breast: Secondary | ICD-10-CM

## 2024-04-02 DIAGNOSIS — E1165 Type 2 diabetes mellitus with hyperglycemia: Secondary | ICD-10-CM

## 2024-04-02 DIAGNOSIS — F17210 Nicotine dependence, cigarettes, uncomplicated: Secondary | ICD-10-CM

## 2024-04-02 DIAGNOSIS — Z7984 Long term (current) use of oral hypoglycemic drugs: Secondary | ICD-10-CM

## 2024-04-02 DIAGNOSIS — Z0001 Encounter for general adult medical examination with abnormal findings: Secondary | ICD-10-CM | POA: Diagnosis not present

## 2024-04-02 DIAGNOSIS — R030 Elevated blood-pressure reading, without diagnosis of hypertension: Secondary | ICD-10-CM

## 2024-04-02 DIAGNOSIS — J42 Unspecified chronic bronchitis: Secondary | ICD-10-CM | POA: Diagnosis not present

## 2024-04-02 DIAGNOSIS — Z2821 Immunization not carried out because of patient refusal: Secondary | ICD-10-CM | POA: Diagnosis not present

## 2024-04-02 DIAGNOSIS — Z0000 Encounter for general adult medical examination without abnormal findings: Secondary | ICD-10-CM

## 2024-04-02 MED ORDER — BLOOD GLUCOSE MONITORING SUPPL DEVI: 1.0000 | Freq: Three times a day (TID) | 0 refills | Status: AC

## 2024-04-02 MED ORDER — BLOOD PRESSURE MONITOR 3 DEVI: 0 refills | Status: AC

## 2024-04-02 MED ORDER — LANCETS MISC: 1.0000 | 0 refills | Status: AC

## 2024-04-02 MED ORDER — BLOOD GLUCOSE TEST VI STRP: 1.0000 | ORAL_STRIP | Freq: Three times a day (TID) | 0 refills | Status: AC

## 2024-04-02 MED ORDER — LANCET DEVICE MISC: 1.0000 | Freq: Three times a day (TID) | 0 refills | Status: AC

## 2024-04-02 NOTE — Progress Notes (Signed)
 "  HM Addressed: Mammogram ordered Vaccines not given: Flu, pneaumonia declined today Chief Complaint  Patient presents with   Medicare Wellness     Subjective:   Wendy Kramer is a 64 y.o. female who presents for a Medicare Annual Wellness Visit.  Visit info / Clinical Intake: Medicare Wellness Visit Type:: Subsequent Annual Wellness Visit Persons participating in visit and providing information:: patient Medicare Wellness Visit Mode:: In-person (required for WTM) Interpreter Needed?: No Pre-visit prep was completed: yes AWV questionnaire completed by patient prior to visit?: no Living arrangements:: with family/others Patient's Overall Health Status Rating: good Typical amount of pain: some Does pain affect daily life?: (!) yes (sometimes) Are you currently prescribed opioids?: (!) yes (Tramadol )  Dietary Habits and Nutritional Risks How many meals a day?: 3 Eats fruit and vegetables daily?: yes Most meals are obtained by: preparing own meals In the last 2 weeks, have you had any of the following?: none Diabetic:: (!) yes Any non-healing wounds?: no How often do you check your BS?: 1 Would you like to be referred to a Nutritionist or for Diabetic Management? : no  Functional Status Activities of Daily Living (to include ambulation/medication): Independent Ambulation: Independent Medication Administration: Independent Home Management (perform basic housework or laundry): Independent Manage your own finances?: yes Primary transportation is: driving Concerns about vision?: no *vision screening is required for WTM* Concerns about hearing?: no  Fall Screening Falls in the past year?: 0 Number of falls in past year: 0 Was there an injury with Fall?: 0 Fall Risk Category Calculator: 0 Patient Fall Risk Level: Low Fall Risk  Fall Risk Patient at Risk for Falls Due to: No Fall Risks Fall risk Follow up: Falls evaluation completed; Education provided; Falls prevention  discussed  Home and Transportation Safety: All rugs have non-skid backing?: N/A, no rugs All stairs or steps have railings?: (!) no Grab bars in the bathtub or shower?: yes Have non-skid surface in bathtub or shower?: yes Good home lighting?: yes Regular seat belt use?: yes Hospital stays in the last year:: (!) yes How many hospital stays:: 1 Reason: sepsis  Cognitive Assessment Difficulty concentrating, remembering, or making decisions? : no Will 6CIT or Mini Cog be Completed: yes What year is it?: 0 points What month is it?: 0 points Give patient an address phrase to remember (5 components): [address] [facility] [place] About what time is it?: 0 points Count backwards from 20 to 1: 0 points Say the months of the year in reverse: 0 points Repeat the address phrase from earlier: 0 points 6 CIT Score: 0 points  Advance Directives (For Healthcare) Does Patient Have a Medical Advance Directive?: No Would patient like information on creating a medical advance directive?: Yes (MAU/Ambulatory/Procedural Areas - Information given)  Reviewed/Updated  Reviewed/Updated: Reviewed All (Medical, Surgical, Family, Medications, Allergies, Care Teams, Patient Goals)    Allergies (verified) Asa [aspirin], Augmentin [amoxicillin-pot clavulanate], Ferric derisomaltose , Trelegy ellipta [fluticasone -umeclidin-vilant], Levaquin [levofloxacin in d5w], Lovenox [enoxaparin sodium], and Whole blood   Current Medications (verified) Outpatient Encounter Medications as of 04/02/2024  Medication Sig   Accu-Chek Softclix Lancets lancets Use as instructed to monitor glucose 1 times daily.   acetaminophen  (TYLENOL ) 500 MG tablet Take 1,000 mg by mouth every 6 (six) hours as needed for moderate pain (pain score 4-6).   albuterol  (PROVENTIL  HFA;VENTOLIN  HFA) 108 (90 Base) MCG/ACT inhaler Inhale 1 puff into the lungs every 6 (six) hours as needed for wheezing or shortness of breath.   ALPRAZolam  (XANAX ) 0.25  MG  tablet TAKE 1 TABLET(0.25 MG) BY MOUTH TWICE DAILY AS NEEDED FOR ANXIETY   Blood Glucose Monitoring Suppl (ACCU-CHEK GUIDE ME) w/Device KIT 1 Piece by Does not apply route as directed.   Blood Glucose Monitoring Suppl DEVI 1 each by Does not apply route in the morning, at noon, and at bedtime. May substitute to any manufacturer covered by patient's insurance.   Blood Pressure Monitoring (BLOOD PRESSURE MONITOR 3) DEVI Check blood pressure once daily 1 hour after taking blood pressure medication   budesonide -formoterol  (SYMBICORT ) 160-4.5 MCG/ACT inhaler Inhale 2 puffs into the lungs 2 (two) times daily.   fluticasone  (FLONASE ) 50 MCG/ACT nasal spray Place 2 sprays into both nostrils daily as needed for allergies or rhinitis.   glipiZIDE  (GLUCOTROL  XL) 5 MG 24 hr tablet Take 1 tablet (5 mg total) by mouth daily with breakfast.   glucose blood (ACCU-CHEK GUIDE) test strip USE TO CHECK BLOOD SUGARS ONCE DAILY   Glucose Blood (BLOOD GLUCOSE TEST STRIPS) STRP 1 each by In Vitro route in the morning, at noon, and at bedtime. May substitute to any manufacturer covered by patient's insurance.   Lancet Device MISC 1 each by Does not apply route in the morning, at noon, and at bedtime. May substitute to any manufacturer covered by patient's insurance.   Lancets MISC 1 each by Does not apply route as directed. Dispense based on patient and insurance preference. Use up to four times daily as directed. (FOR ICD-10 E10.9, E11.9).   lisinopril (ZESTRIL) 20 MG tablet TAKE 1 TABLET BY MOUTH TWICE DAILY   metFORMIN  (GLUCOPHAGE ) 500 MG tablet Take 1 tablet (500 mg total) by mouth 2 (two) times daily with a meal.   nystatin  (MYCOSTATIN /NYSTOP ) powder Apply 1 Application topically 3 (three) times daily.   pantoprazole  (PROTONIX ) 40 MG tablet Take 1 tablet (40 mg total) by mouth 2 (two) times daily before a meal. (Patient taking differently: Take 40 mg by mouth 2 (two) times daily before a meal. Once a day)   traMADol   (ULTRAM ) 50 MG tablet Take 2 tablets (100 mg total) by mouth every 6 (six) hours as needed.   No facility-administered encounter medications on file as of 04/02/2024.    History: Past Medical History:  Diagnosis Date   Allergy 1980   Seasonal allergies   Anemia 2023   Arthritis    Blood transfusion without reported diagnosis 2024   Cardiac murmur    Cataract    Cervical spine disease    Cirrhosis (HCC)    likely secondary to NASH   COPD (chronic obstructive pulmonary disease) (HCC)    Diabetes mellitus without complication (HCC)    diet controlled   Dysrhythmia    Exogenous hyperlipidemia    GERD (gastroesophageal reflux disease)    History of kidney stones    HTN (hypertension)    Morbid obesity due to excess calories (HCC)    Other chronic pain    Panic disorder    Patent foramen ovale    TEE 2018   Prediabetes    Rheumatoid arthritis with positive rheumatoid factor, involving unspecified site (HCC) 01/08/2024   Sleep apnea    Stroke (HCC)    mini-stroke no deficits from this.   Unspecified asthma with (acute) exacerbation    Vitamin D deficiency    Past Surgical History:  Procedure Laterality Date   BIOPSY  03/05/2018   Procedure: BIOPSY;  Surgeon: Shaaron Lamar HERO, MD;  Location: [facility];  Service: Endoscopy;;  gastric  CHOLECYSTECTOMY     COLONOSCOPY WITH PROPOFOL  N/A 03/05/2018   Dr. Shaaron: Diverticulosis in the sigmoid and descending colon.  3 colon polyps removed, 2 hyperplastic and one sessile serrated polyp.  Next colonoscopy 3 years.   COLONOSCOPY WITH PROPOFOL  N/A 12/27/2022   Procedure: COLONOSCOPY WITH PROPOFOL ;  Surgeon: Cinderella Deatrice FALCON, MD;  Location: [facility];  Service: Endoscopy;  Laterality: N/A;   ESOPHAGOGASTRODUODENOSCOPY (EGD) WITH PROPOFOL  N/A 03/05/2018   Dr. Shaaron: Erosive reflux esophagitis, small hiatal hernia, erosive gastropathy with reactive gastropathy on biopsy.  No H. pylori.   ESOPHAGOGASTRODUODENOSCOPY (EGD) WITH PROPOFOL   N/A 12/27/2022   Procedure: ESOPHAGOGASTRODUODENOSCOPY (EGD) WITH PROPOFOL ;  Surgeon: Cinderella Deatrice FALCON, MD;  [facility]: [facility];  Service: Endoscopy;  Laterality: N/A;   ESOPHAGOGASTRODUODENOSCOPY (EGD) WITH PROPOFOL  N/A 04/08/2023   Procedure: ESOPHAGOGASTRODUODENOSCOPY (EGD) WITH PROPOFOL ;  Surgeon: Eartha Angelia Sieving, MD;  [facility]: [facility];  Service: Gastroenterology;  Laterality: N/A;   ESOPHAGOGASTRODUODENOSCOPY (EGD) WITH PROPOFOL  N/A 06/11/2023   Procedure: ESOPHAGOGASTRODUODENOSCOPY (EGD) WITH PROPOFOL ;  Surgeon: Shaaron Lamar HERO, MD;  [facility]: [facility];  Service: Endoscopy;  Laterality: N/A;  900am, asa 3   HERNIA REPAIR     umbilical   HOT HEMOSTASIS  12/27/2022   Procedure: HOT HEMOSTASIS (ARGON PLASMA COAGULATION/BICAP);  Surgeon: Cinderella Deatrice FALCON, MD;  [facility]: [facility];  Service: Endoscopy;;   HOT HEMOSTASIS  06/11/2023   Procedure: EGD, WITH ARGON PLASMA COAGULATION;  Surgeon: Shaaron Lamar HERO, MD;  [facility];  Service: Endoscopy;;   POLYPECTOMY  03/05/2018   Procedure: POLYPECTOMY;  Surgeon: Shaaron Lamar HERO, MD;  [facility];  Service: Endoscopy;;  colon   SHOULDER ARTHROSCOPY Right    TEE WITHOUT CARDIOVERSION  2018   patent foramen ovale   TONSILLECTOMY AND ADENOIDECTOMY     TUBAL LIGATION     Family History  Problem Relation Age of Onset   Breast cancer Maternal Aunt    Colon cancer Other        maternal great grandfather   Liver disease Brother        Fatty liver   HIV Brother    Anxiety disorder Brother    Hypertension Mother    Hyperlipidemia Mother    Anxiety disorder Mother    Arthritis Mother    Birth defects Mother    COPD Mother    Varicose Veins Mother    Hypertension Father    Thyroid  disease Sister    Anxiety disorder Sister    Pancreatic cancer Sister    Anxiety disorder Daughter    Miscarriages / Stillbirths Daughter    Birth defects Son    Healthy Son    Heart disease Son    Early death Son     Social History   Occupational History   Not on file  Tobacco Use   Smoking status: Every Day    Current packs/day: 0.50    Average packs/day: 0.5 packs/day for 48.0 years (24.0 ttl pk-yrs)    Types: Cigarettes    Start date: 1978   Smokeless tobacco: Never  Vaping Use   Vaping status: Never Used  Substance and Sexual Activity   Alcohol  use: No   Drug use: No   Sexual activity: Not Currently    Birth control/protection: Post-menopausal, Surgical    Comment: tubal   Tobacco Counseling Ready to quit: No Counseling given: Yes  SDOH Screenings   Food Insecurity: Patient Declined (04/02/2024)  Housing: Unknown (04/02/2024)  Transportation  Needs: No Transportation Needs (04/02/2024)  Utilities: Patient Declined (04/02/2024)  Alcohol  Screen: Low Risk (06/04/2022)  Depression (PHQ2-9): Low Risk (04/02/2024)  Financial Resource Strain: Low Risk (07/16/2023)  Physical Activity: Sufficiently Active (04/02/2024)  Social Connections: Socially Isolated (04/02/2024)  Stress: No Stress Concern Present (04/02/2024)  Tobacco Use: High Risk (04/02/2024)  Health Literacy: Adequate Health Literacy (04/02/2024)   See flowsheets for full screening details  Depression Screen PHQ 2 & 9 Depression Scale- Over the past 2 weeks, how often have you been bothered by any of the following problems? Little interest or pleasure in doing things: 0 Feeling down, depressed, or hopeless (PHQ Adolescent also includes...irritable): 0 PHQ-2 Total Score: 0 Trouble falling or staying asleep, or sleeping too much: 0 Feeling tired or having little energy: 0 Poor appetite or overeating (PHQ Adolescent also includes...weight loss): 0 Feeling bad about yourself - or that you are a failure or have let yourself or your family down: 0 Trouble concentrating on things, such as reading the newspaper or watching television (PHQ Adolescent also includes...like school work): 0 Moving or speaking so slowly that other people could have  noticed. Or the opposite - being so fidgety or restless that you have been moving around a lot more than usual: 0 Thoughts that you would be better off dead, or of hurting yourself in some way: 0 PHQ-9 Total Score: 0 If you checked off any problems, how difficult have these problems made it for you to do your work, take care of things at home, or get along with other people?: Not difficult at all  Depression Treatment Depression Interventions/Treatment : [id number] Score <4 Follow-up Not Indicated     Goals Addressed               This Visit's Progress     I want to get my diabetes and blood sugar better controlled (pt-stated)               Objective:    Today's Vitals   04/02/24 1324  BP: 120/64  Pulse: 92  Resp: 12  SpO2: 97%  Weight: 227 lb 9.6 oz (103.2 kg)  Height: 5' 3 (1.6 m)   Body mass index is 40.32 kg/m.  Hearing/Vision screen Hearing Screening - Comments:: Patient denies any hearing difficulties.   Vision Screening - Comments:: Patient does not have an eye doctor. A list of eye doctors has been provided to the patient.   Immunizations and Health Maintenance Health Maintenance  Topic Date Due   COVID-19 Vaccine (1) Never done   DTaP/Tdap/Td (1 - Tdap) Never done   Pneumococcal Vaccine: 50+ Years (1 of 2 - PCV) Never done   Zoster Vaccines- Shingrix (1 of 2) Never done   Hepatitis B Vaccines 19-59 Average Risk (1 of 3 - Risk 3-dose series) Never done   Medicare Annual Wellness (AWV)  10/23/2023   Diabetic kidney evaluation - Urine ACR  12/28/2023   Mammogram  01/02/2024   Lung Cancer Screening  02/06/2024   Influenza Vaccine  06/29/2024 (Originally 10/31/2023)   HEMOGLOBIN A1C  08/31/2024   OPHTHALMOLOGY EXAM  09/17/2024   Cervical Cancer Screening (HPV/Pap Cotest)  10/05/2024   Diabetic kidney evaluation - eGFR measurement  01/26/2025   FOOT EXAM  03/02/2025   Colonoscopy  12/26/2032   Hepatitis C Screening  Completed   HIV Screening  Completed    HPV VACCINES  Aged Out   Meningococcal B Vaccine  Aged Out        Assessment/Plan:  This is a routine wellness examination for Wendy Kramer.  Patient Care Team: Bevely Doffing, FNP as PCP - General (Family Medicine) Shaaron, Lamar HERO, MD as Consulting Physician (Gastroenterology) Therisa Benton PARAS, NP as Nurse Practitioner (Endocrinology) Geofm Delon BRAVO, NP as Nurse Practitioner (Oncology) Lamon Pleasant HERO DEVONNA as Physician Assistant (Oncology) Ezzard Sonny GORMAN DEVONNA as Physician Assistant (Gastroenterology) Davonna Siad, MD as Consulting Physician (Oncology)  I have personally reviewed and noted the following in the patients chart:   Medical and social history Use of alcohol , tobacco or illicit drugs  Current medications and supplements including opioid prescriptions. Functional ability and status Nutritional status Physical activity Advanced directives List of other physicians Hospitalizations, surgeries, and ER visits in previous 12 months Vitals Screenings to include cognitive, depression, and falls Referrals and appointments  Orders Placed This Encounter  Procedures   MM 3D SCREENING MAMMOGRAM BILATERAL BREAST    Standing Status:   Future    Expiration Date:   04/02/2025    Reason for Exam (SYMPTOM  OR DIAGNOSIS REQUIRED):   breast cancer screening    Preferred imaging location?:   [facility]   Microalbumin / creatinine urine ratio   Ambulatory Referral for Lung Cancer Scre    Referral Priority:   Routine    Referral Type:   Consultation    Referral Reason:   Specialty Services Required    Number of Visits Requested:   1   In addition, I have reviewed and discussed with patient certain preventive protocols, quality metrics, and best practice recommendations. A written personalized care plan for preventive services as well as general preventive health recommendations were provided to patient.   Jhayla Podgorski, CMA   04/02/2024   Return for [facility]  Wellness Visit w/ Wellness Nurse on Tuesday April 05, 2025 at 1:50pm.  After Visit Summary: (MyChart) Due to this being a telephonic visit, the after visit summary with patients personalized plan was offered to patient via MyChart    "

## 2024-04-02 NOTE — Patient Instructions (Signed)
 Wendy Kramer,  Thank you for taking the time for your Medicare Wellness Visit. I appreciate your continued commitment to your health goals. Please review the care plan we discussed, and feel free to reach out if I can assist you further.  Please note that Annual Wellness Visits do not include a physical exam. Some assessments may be limited, especially if the visit was conducted virtually. If needed, we may recommend an in-person follow-up with your provider.  Ongoing Care Seeing your primary care provider every 3 to 6 months helps us  monitor your health and provide consistent, personalized care.   1 year follow up for Medicare well visit: Tuesday April 05, 2025 at 1:50 pm with medicare wellness nurse in office  Referrals If a referral was made during today's visit and you haven't received any updates within two weeks, please contact the referred provider directly to check on the status.  Mammogram at [facility] Call [phone] to schedule your screening No perfumes, lotions, or deodorants the day of your screening. You can schedule your mammogram through mychart!   Orders Placed This Encounter  Procedures   MM 3D SCREENING MAMMOGRAM BILATERAL BREAST    Standing Status:   Future    Expiration Date:   04/02/2025    Reason for Exam (SYMPTOM  OR DIAGNOSIS REQUIRED):   breast cancer screening    Preferred imaging location?:   [facility]   Microalbumin / creatinine urine ratio   Ambulatory Referral for Lung Cancer Scre    Referral Priority:   Routine    Referral Type:   Consultation    Referral Reason:   Specialty Services Required    Number of Visits Requested:   1    Meds ordered this encounter  Medications   Blood Glucose Monitoring Suppl DEVI    Sig: 1 each by Does not apply route in the morning, at noon, and at bedtime. May substitute to any manufacturer covered by patient's insurance.    Dispense:  1 each    Refill:  0   Glucose Blood (BLOOD GLUCOSE TEST STRIPS) STRP     Sig: 1 each by In Vitro route in the morning, at noon, and at bedtime. May substitute to any manufacturer covered by patient's insurance.    Dispense:  100 strip    Refill:  0   Lancet Device MISC    Sig: 1 each by Does not apply route in the morning, at noon, and at bedtime. May substitute to any manufacturer covered by patient's insurance.    Dispense:  1 each    Refill:  0   Lancets MISC    Sig: 1 each by Does not apply route as directed. Dispense based on patient and insurance preference. Use up to four times daily as directed. (FOR ICD-10 E10.9, E11.9).    Dispense:  100 each    Refill:  0   Blood Pressure Monitoring (BLOOD PRESSURE MONITOR 3) DEVI    Sig: Check blood pressure once daily 1 hour after taking blood pressure medication    Dispense:  1 each    Refill:  0   Recommended Screenings:  Health Maintenance  Topic Date Due   COVID-19 Vaccine (1) Never done   DTaP/Tdap/Td vaccine (1 - Tdap) Never done   Pneumococcal Vaccine for age over 37 (1 of 2 - PCV) Never done   Zoster (Shingles) Vaccine (1 of 2) Never done   Hepatitis B Vaccine (1 of 3 - Risk 3-dose series) Never done  Medicare Annual Wellness Visit  10/23/2023   Yearly kidney health urinalysis for diabetes  12/28/2023   Breast Cancer Screening  01/02/2024   Screening for Lung Cancer  02/06/2024   Flu Shot  06/29/2024*   Hemoglobin A1C  08/31/2024   Eye exam for diabetics  09/17/2024   Pap with HPV screening  10/05/2024   Yearly kidney function blood test for diabetes  01/26/2025   Complete foot exam   03/02/2025   Colon Cancer Screening  12/26/2032   Hepatitis C Screening  Completed   HIV Screening  Completed   HPV Vaccine  Aged Out   Meningitis B Vaccine  Aged Out  *Topic was postponed. The date shown is not the original due date.       04/02/2024    1:26 PM  Advanced Directives  Does Patient Have a Medical Advance Directive? No  Would patient like information on creating a medical advance  directive? Yes (MAU/Ambulatory/Procedural Areas - Information given)    Vision: Annual vision screenings are recommended for early detection of glaucoma, cataracts, and diabetic retinopathy. These exams can also reveal signs of chronic conditions such as diabetes and high blood pressure.  Dental: Annual dental screenings help detect early signs of oral cancer, gum disease, and other conditions linked to overall health, including heart disease and diabetes.  Please see the attached documents for additional preventive care recommendations.

## 2024-04-04 LAB — MICROALBUMIN / CREATININE URINE RATIO
Creatinine, Urine: 139.2 mg/dL
Microalb/Creat Ratio: 18 mg/g{creat} (ref 0–29)
Microalbumin, Urine: 24.5 ug/mL

## 2024-04-05 ENCOUNTER — Encounter: Payer: Self-pay | Admitting: *Deleted

## 2024-04-08 ENCOUNTER — Other Ambulatory Visit: Payer: Self-pay

## 2024-04-08 DIAGNOSIS — F411 Generalized anxiety disorder: Secondary | ICD-10-CM

## 2024-04-13 ENCOUNTER — Encounter: Payer: Self-pay | Admitting: Gastroenterology

## 2024-04-13 ENCOUNTER — Ambulatory Visit: Admitting: Gastroenterology

## 2024-04-13 ENCOUNTER — Encounter: Payer: Self-pay | Admitting: *Deleted

## 2024-04-13 VITALS — BP 132/72 | HR 88 | Temp 98.6°F | Ht 63.0 in | Wt 228.4 lb

## 2024-04-13 DIAGNOSIS — K7581 Nonalcoholic steatohepatitis (NASH): Secondary | ICD-10-CM | POA: Diagnosis not present

## 2024-04-13 DIAGNOSIS — K31819 Angiodysplasia of stomach and duodenum without bleeding: Secondary | ICD-10-CM

## 2024-04-13 DIAGNOSIS — K3189 Other diseases of stomach and duodenum: Secondary | ICD-10-CM

## 2024-04-13 DIAGNOSIS — K7460 Unspecified cirrhosis of liver: Secondary | ICD-10-CM

## 2024-04-13 DIAGNOSIS — D509 Iron deficiency anemia, unspecified: Secondary | ICD-10-CM | POA: Diagnosis not present

## 2024-04-13 DIAGNOSIS — K219 Gastro-esophageal reflux disease without esophagitis: Secondary | ICD-10-CM | POA: Diagnosis not present

## 2024-04-13 NOTE — Patient Instructions (Signed)
 Complete labs and ultrasound. We will be in touch with results as available.  Continue pantoprazole  once to twice daily before meal for reflux.

## 2024-04-13 NOTE — Progress Notes (Signed)
 "    GI Office Note    Referring Provider: Bevely Doffing, FNP Primary Care Physician:  Bevely Doffing, FNP  Primary Gastroenterologist: Ozell Hollingshead, MD   Chief Complaint   Chief Complaint  Patient presents with   Follow-up    Follow up on Cirrhosis and labs / US  to be scheduled    History of Present Illness   Wendy Kramer is a 64 y.o. female presenting today for follow up. Last seen 08/2023.  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   MELD 3.0 of 7 in 09/2023. Due liver imaging at this time.  Discussed the use of AI scribe software for clinical note transcription with the patient, who gave verbal consent to proceed.  History of Present Illness Wendy Kramer is a 64 year old female with cirrhosis and iron  deficiency anemia who presents for routine follow-up and monitoring of liver function and anemia.  Cirrhosis and iron  deficiency anemia have been managed with improved hemoglobin to 12.5 in October 2025. Last liver ultrasound was in July 2025 and liver labs in September 2025. She continues to follow with hematology.  She takes pantoprazole  once daily with occasional second doses. She has intermittent fatigue and leg cramps. She restarted vitamins without iron  and has taken magnesium  a few times. Appetite is good with a 5-pound weight gain over the holidays. She has intermittent abdominal bloating described as watermelon belly. She has been lifting heavier objects and wonders if this worsens her symptoms. Stools are usually loose, not dark, and she has not seen overt blood.  Her diabetic provider prescribed glipizide  5 mg daily, but she has not started it due to concern about liver effects. She is very cautious with new medications and checks for interactions with her pharmacist.  She has chronic neck and balance problems and arthritis pain. She uses Xanax  infrequently for anxiety, with  a brief 2-day course recently after months without use.  She is the primary caregiver for her daughter with Chiari malformation and anxiety and helps care for three autistic grandchildren, which increases her stress and fatigue.   Wt Readings from Last 3 Encounters:  04/13/24 228 lb 6.4 oz (103.6 kg)  04/02/24 227 lb 9.6 oz (103.2 kg)  03/02/24 229 lb 12.8 oz (104.2 kg)     Prior Data     Results       Latest Ref Rng & Units 01/27/2024   10:24 AM 12/22/2023    8:58 AM 10/20/2023    1:55 PM  CBC  WBC 4.0 - 10.5 K/uL 4.8  4.7  7.7   Hemoglobin 12.0 - 15.0 g/dL 87.4  87.2  89.5   Hematocrit 36.0 - 46.0 % 40.6  40.7  34.6   Platelets 150 - 400 K/uL 147  179  207       Latest Ref Rng & Units 01/27/2024   10:24 AM 10/23/2023    9:33 AM 07/02/2023    8:52 AM  Hepatic Function  Total Protein 6.5 - 8.1 g/dL 6.8  6.5  6.8   Albumin 3.5 - 5.0 g/dL 4.1  3.9  4.2   AST 15 - 41 U/L 29  23  33   ALT 0 - 44 U/L 24  12  22    Alk Phosphatase 38 - 126 U/L 89  97  123   Total Bilirubin 0.0 - 1.2 mg/dL 0.4  0.3  0.3  Latest Ref Rng & Units 01/27/2024   10:24 AM 10/23/2023    9:33 AM 08/15/2023    9:54 AM  BMP  Glucose 70 - 99 mg/dL 726  811  834   BUN 8 - 23 mg/dL 11  14  13    Creatinine 0.44 - 1.00 mg/dL 9.11  9.16  9.17   BUN/Creat Ratio 12 - 28  17  16    Sodium 135 - 145 mmol/L 141  140  139   Potassium 3.5 - 5.1 mmol/L 5.2  4.8  5.2   Chloride 98 - 111 mmol/L 103  103  102   CO2 22 - 32 mmol/L 27  21  21    Calcium 8.9 - 10.3 mg/dL 9.7  9.2  9.3    Lab Results  Component Value Date   VITAMINB12 785 01/27/2024   Lab Results  Component Value Date   FOLATE 10.5 01/27/2024   Lab Results  Component Value Date   IRON  72 01/27/2024   TIBC 360 01/27/2024   FERRITIN 227 01/27/2024   Lab Results  Component Value Date   INR 0.9 10/23/2023   INR 1.1 04/08/2023   INR 1.0 12/25/2022   12/2023: AFP 2.1  CT A/P with contrast 04/2023: IMPRESSION: -Changes of hepatic  cirrhosis. -Tiny nonobstructing left renal stone. -Diverticulosis without diverticulitis. -Mild ascending colitis.   EGD 05/2023: -normal esophagus -portal hypertensive gastropaty.  -GAVE s/p APC  EGD 04/08/23: - Normal esophagus.  - Portal hypertensive gastropathy.  - Gastric antral vascular ectasia without bleeding. Treated with argon plasma coagulation ( APC) .  - Normal examined duodenum.   EGD 12/27/22: - Portal hypertensive gastropathy.  - Gastric antral vascular ectasia with bleeding. Treated with argon plasma coagulation ( APC) . Likely cause of iron  deficiency anemia  -Normal duodenal bulb and second portion of the duodenum.   Colonoscopy 12/27/22: - Diverticulosis in the left colon.  - Medium- sized lipoma in the transverse colon.  - Non- bleeding external and internal hemorrhoids.  - No specimens collected.   - Recommended repeat colonoscopy in 5 years.       Medications   Current Outpatient Medications  Medication Sig Dispense Refill   Accu-Chek Softclix Lancets lancets Use as instructed to monitor glucose 1 times daily. 100 each 5   acetaminophen  (TYLENOL ) 500 MG tablet Take 1,000 mg by mouth every 6 (six) hours as needed for moderate pain (pain score 4-6).     albuterol  (PROVENTIL  HFA;VENTOLIN  HFA) 108 (90 Base) MCG/ACT inhaler Inhale 1 puff into the lungs every 6 (six) hours as needed for wheezing or shortness of breath.     ALPRAZolam  (XANAX ) 0.25 MG tablet TAKE 1 TABLET(0.25 MG) BY MOUTH TWICE DAILY AS NEEDED FOR ANXIETY 60 tablet 0   Blood Glucose Monitoring Suppl (ACCU-CHEK GUIDE ME) w/Device KIT 1 Piece by Does not apply route as directed. 1 kit 0   Blood Glucose Monitoring Suppl DEVI 1 each by Does not apply route in the morning, at noon, and at bedtime. May substitute to any manufacturer covered by patient's insurance. 1 each 0   Blood Pressure Monitoring (BLOOD PRESSURE MONITOR 3) DEVI Check blood pressure once daily 1 hour after taking blood pressure  medication 1 each 0   budesonide -formoterol  (SYMBICORT ) 160-4.5 MCG/ACT inhaler Inhale 2 puffs into the lungs 2 (two) times daily. 1 each 3   fluticasone  (FLONASE ) 50 MCG/ACT nasal spray Place 2 sprays into both nostrils daily as needed for allergies or rhinitis.     glucose  blood (ACCU-CHEK GUIDE) test strip USE TO CHECK BLOOD SUGARS ONCE DAILY 100 strip 1   Glucose Blood (BLOOD GLUCOSE TEST STRIPS) STRP 1 each by In Vitro route in the morning, at noon, and at bedtime. May substitute to any manufacturer covered by patient's insurance. 100 strip 0   Lancet Device MISC 1 each by Does not apply route in the morning, at noon, and at bedtime. May substitute to any manufacturer covered by patient's insurance. 1 each 0   Lancets MISC 1 each by Does not apply route as directed. Dispense based on patient and insurance preference. Use up to four times daily as directed. (FOR ICD-10 E10.9, E11.9). 100 each 0   lisinopril (ZESTRIL) 20 MG tablet TAKE 1 TABLET BY MOUTH TWICE DAILY 180 tablet 0   metFORMIN  (GLUCOPHAGE ) 500 MG tablet Take 1 tablet (500 mg total) by mouth 2 (two) times daily with a meal. 180 tablet 3   nystatin  (MYCOSTATIN /NYSTOP ) powder Apply 1 Application topically 3 (three) times daily. 60 g 5   pantoprazole  (PROTONIX ) 40 MG tablet Take 1 tablet (40 mg total) by mouth 2 (two) times daily before a meal. 180 tablet 3   traMADol  (ULTRAM ) 50 MG tablet Take 2 tablets (100 mg total) by mouth every 6 (six) hours as needed. 180 tablet 0   glipiZIDE  (GLUCOTROL  XL) 5 MG 24 hr tablet Take 1 tablet (5 mg total) by mouth daily with breakfast. (Patient not taking: Reported on 04/13/2024) 90 tablet 1   No current facility-administered medications for this visit.    Allergies   Allergies as of 04/13/2024 - Review Complete 04/13/2024  Allergen Reaction Noted   Asa [aspirin] Anaphylaxis 09/24/2016   Augmentin [amoxicillin-pot clavulanate] Nausea And Vomiting and Other (See Comments) 09/24/2016   Ferric  derisomaltose Shortness Of Breath, Nausea Only, Swelling, and Other (See Comments) 06/03/2023   Trelegy ellipta [fluticasone -umeclidin-vilant] Anxiety 07/16/2023   Levaquin [levofloxacin in d5w] Itching and Other (See Comments) 09/24/2016   Lovenox [enoxaparin sodium] Other (See Comments) 09/24/2016   Whole blood Palpitations and Rash 06/09/2023     Past Medical History   Past Medical History:  Diagnosis Date   Allergy 1980   Seasonal allergies   Anemia 2023   Arthritis    Blood transfusion without reported diagnosis 2024   Cardiac murmur    Cataract    Cervical spine disease    Cirrhosis (HCC)    likely secondary to NASH   COPD (chronic obstructive pulmonary disease) (HCC)    Diabetes mellitus without complication (HCC)    diet controlled   Dysrhythmia    Exogenous hyperlipidemia    GERD (gastroesophageal reflux disease)    History of kidney stones    HTN (hypertension)    Morbid obesity due to excess calories (HCC)    Other chronic pain    Panic disorder    Patent foramen ovale    TEE 2018   Prediabetes    Rheumatoid arthritis with positive rheumatoid factor, involving unspecified site (HCC) 01/08/2024   Sleep apnea    Stroke (HCC)    mini-stroke no deficits from this.   Unspecified asthma with (acute) exacerbation    Vitamin D deficiency     Past Surgical History   Past Surgical History:  Procedure Laterality Date   BIOPSY  03/05/2018   Procedure: BIOPSY;  Surgeon: Shaaron Lamar HERO, MD;  Location: [facility];  Service: Endoscopy;;  gastric   CHOLECYSTECTOMY     COLONOSCOPY WITH PROPOFOL  N/A 03/05/2018   Dr.  [name]: Diverticulosis in the sigmoid and descending colon.  3 colon polyps removed, 2 hyperplastic and one sessile serrated polyp.  Next colonoscopy 3 years.   COLONOSCOPY WITH PROPOFOL  N/A 12/27/2022   Procedure: COLONOSCOPY WITH PROPOFOL ;  Surgeon: Cinderella Deatrice FALCON, MD;  Location: [facility];  Service: Endoscopy;  Laterality: N/A;    ESOPHAGOGASTRODUODENOSCOPY (EGD) WITH PROPOFOL  N/A 03/05/2018   Dr. Shaaron: Erosive reflux esophagitis, small hiatal hernia, erosive gastropathy with reactive gastropathy on biopsy.  No H. pylori.   ESOPHAGOGASTRODUODENOSCOPY (EGD) WITH PROPOFOL  N/A 12/27/2022   Procedure: ESOPHAGOGASTRODUODENOSCOPY (EGD) WITH PROPOFOL ;  Surgeon: Cinderella Deatrice FALCON, MD;  Location: [facility];  Service: Endoscopy;  Laterality: N/A;   ESOPHAGOGASTRODUODENOSCOPY (EGD) WITH PROPOFOL  N/A 04/08/2023   Procedure: ESOPHAGOGASTRODUODENOSCOPY (EGD) WITH PROPOFOL ;  Surgeon: Eartha Angelia Sieving, MD;  Location: [facility];  Service: Gastroenterology;  Laterality: N/A;   ESOPHAGOGASTRODUODENOSCOPY (EGD) WITH PROPOFOL  N/A 06/11/2023   Procedure: ESOPHAGOGASTRODUODENOSCOPY (EGD) WITH PROPOFOL ;  Surgeon: Shaaron Lamar HERO, MD;  Location: [facility];  Service: Endoscopy;  Laterality: N/A;  900am, asa 3   HERNIA REPAIR     umbilical   HOT HEMOSTASIS  12/27/2022   Procedure: HOT HEMOSTASIS (ARGON PLASMA COAGULATION/BICAP);  Surgeon: Cinderella Deatrice FALCON, MD;  Location: [facility];  Service: Endoscopy;;   HOT HEMOSTASIS  06/11/2023   Procedure: EGD, WITH ARGON PLASMA COAGULATION;  Surgeon: Shaaron Lamar HERO, MD;  Location: [facility];  Service: Endoscopy;;   POLYPECTOMY  03/05/2018   Procedure: POLYPECTOMY;  Surgeon: Shaaron Lamar HERO, MD;  Location: [facility];  Service: Endoscopy;;  colon   SHOULDER ARTHROSCOPY Right    TEE WITHOUT CARDIOVERSION  2018   patent foramen ovale   TONSILLECTOMY AND ADENOIDECTOMY     TUBAL LIGATION      Past Family History   Family History  Problem Relation Age of Onset   Breast cancer Maternal Aunt    Colon cancer Other        maternal great grandfather   Liver disease Brother        Fatty liver   HIV Brother    Anxiety disorder Brother    Hypertension Mother    Hyperlipidemia Mother    Anxiety disorder Mother    Arthritis Mother    Birth defects Mother    COPD Mother     Varicose Veins Mother    Hypertension Father    Thyroid  disease Sister    Anxiety disorder Sister    Pancreatic cancer Sister    Anxiety disorder Daughter    Miscarriages / Stillbirths Daughter    Birth defects Son    Healthy Son    Heart disease Son    Early death Son     Past Social History   Social History   Socioeconomic History   Marital status: Legally Separated    Spouse name: Not on file   Number of children: Not on file   Years of education: Not on file   Highest education level: GED or equivalent  Occupational History   Not on file  Tobacco Use   Smoking status: Every Day    Current packs/day: 0.50    Average packs/day: 0.5 packs/day for 48.0 years (24.0 ttl pk-yrs)    Types: Cigarettes    Start date: 1978   Smokeless tobacco: Never  Vaping Use   Vaping status: Never Used  Substance and Sexual Activity   Alcohol  use: No   Drug use: No   Sexual activity: Not  Currently    Birth control/protection: Post-menopausal, Surgical    Comment: tubal  Other Topics Concern   Not on file  Social History Narrative   Not on file   Social Drivers of Health   Tobacco Use: High Risk (04/13/2024)   Patient History    Smoking Tobacco Use: Every Day    Smokeless Tobacco Use: Never    Passive Exposure: Not on file  Financial Resource Strain: Low Risk (07/16/2023)   Overall Financial Resource Strain (CARDIA)    Difficulty of Paying Living Expenses: Not very hard  Food Insecurity: Patient Declined (04/02/2024)   Epic    Worried About Programme Researcher, Broadcasting/film/video in the Last Year: Patient declined    Barista in the Last Year: Patient declined  Transportation Needs: No Transportation Needs (04/02/2024)   Epic    Lack of Transportation (Medical): No    Lack of Transportation (Non-Medical): No  Physical Activity: Sufficiently Active (04/02/2024)   Exercise Vital Sign    Days of Exercise per Week: 7 days    Minutes of Exercise per Session: 30 min  Stress: No Stress Concern  Present (04/02/2024)   [employer] of Occupational Health - Occupational Stress Questionnaire    Feeling of Stress: Not at all  Social Connections: Socially Isolated (04/02/2024)   Social Connection and Isolation Panel    Frequency of Communication with Friends and Family: More than three times a week    Frequency of Social Gatherings with Friends and Family: More than three times a week    Attends Religious Services: Never    Database Administrator or Organizations: No    Attends Banker Meetings: Never    Marital Status: Separated  Intimate Partner Violence: Not At Risk (04/02/2024)   Epic    Fear of Current or Ex-Partner: No    Emotionally Abused: No    Physically Abused: No    Sexually Abused: No  Depression (PHQ2-9): Low Risk (04/02/2024)   Depression (PHQ2-9)    PHQ-2 Score: 0  Alcohol  Screen: Low Risk (06/04/2022)   Alcohol  Screen    Last Alcohol  Screening Score (AUDIT): 0  Housing: Unknown (04/02/2024)   Epic    Unable to Pay for Housing in the Last Year: Patient declined    Number of Times Moved in the Last Year: 0    Homeless in the Last Year: Patient declined  Utilities: Patient Declined (04/02/2024)   Epic    Threatened with loss of utilities: Patient declined  Health Literacy: Adequate Health Literacy (04/02/2024)   B1300 Health Literacy    Frequency of need for help with medical instructions: Never    Review of Systems   General: Negative for anorexia, weight loss, fever, chills, fatigue, weakness. ENT: Negative for hoarseness, difficulty swallowing , nasal congestion. CV: Negative for chest pain, angina, palpitations, dyspnea on exertion, peripheral edema.  Respiratory: Negative for dyspnea at rest, dyspnea on exertion, cough, sputum, wheezing.  GI: See history of present illness. GU:  Negative for dysuria, hematuria, urinary incontinence, urinary frequency, nocturnal urination.  Endo: Negative for unusual weight change.     Physical Exam   BP 132/72    Pulse 88   Temp 98.6 F (37 C)   Ht 5' 3 (1.6 m)   Wt 228 lb 6.4 oz (103.6 kg)   LMP 09/06/2016   BMI 40.46 kg/m    General: Well-nourished, well-developed in no acute distress.  Eyes: No icterus. Mouth: Oropharyngeal mucosa moist and pink  Lungs: Clear to auscultation bilaterally.  Heart: Regular rate and rhythm, no murmurs rubs or gallops.  Abdomen: Bowel sounds are normal, nontender, nondistended, no hepatosplenomegaly or masses,  no abdominal bruits or hernia , no rebound or guarding.  Rectal: not performed Extremities: No lower extremity edema. No clubbing or deformities. Neuro: Alert and oriented x 4   Skin: Warm and dry, no jaundice.   Psych: Alert and cooperative, normal mood and affect.  Labs   See above  Imaging Studies   No results found.  Assessment/Plan:    Assessment & Plan MASH cirrhosis, has been well compensated. She has PHG.  -CBC, CMET, PT/INR, AFP - u/s for hepatoma screening - return ov in six months   Iron  deficiency anemia likely secondary to gastric antral vascular ectasia (GAVE) and portal hypertensive gastropathy (PHG) Chronic iron  deficiency anemia with intermittent fatigue and leg cramps, no overt gastrointestinal bleeding. Laboratory monitoring indicated prior to hematology follow-up. - update labs now - continue to follow with hematology - avoid NSAIDS - continue PPI    Gastroesophageal reflux disease (GERD) Chronic, well-controlled GERD - Continued pantoprazole , dosing adjusted to once daily as tolerated and twice daily as needed for breakthrough symptoms.     Sonny RAMAN. Ezzard, MHS, PA-C [facility]  "

## 2024-04-16 LAB — CBC WITH DIFFERENTIAL/PLATELET
Basophils Absolute: 0 x10E3/uL (ref 0.0–0.2)
Basos: 1 %
EOS (ABSOLUTE): 0.4 x10E3/uL (ref 0.0–0.4)
Eos: 6 %
Hematocrit: 35.9 % (ref 34.0–46.6)
Hemoglobin: 11 g/dL — ABNORMAL LOW (ref 11.1–15.9)
Immature Grans (Abs): 0 x10E3/uL (ref 0.0–0.1)
Immature Granulocytes: 0 %
Lymphocytes Absolute: 1.7 x10E3/uL (ref 0.7–3.1)
Lymphs: 28 %
MCH: 26 pg — ABNORMAL LOW (ref 26.6–33.0)
MCHC: 30.6 g/dL — ABNORMAL LOW (ref 31.5–35.7)
MCV: 85 fL (ref 79–97)
Monocytes Absolute: 0.4 x10E3/uL (ref 0.1–0.9)
Monocytes: 7 %
Neutrophils Absolute: 3.5 x10E3/uL (ref 1.4–7.0)
Neutrophils: 58 %
Platelets: 185 x10E3/uL (ref 150–450)
RBC: 4.23 x10E6/uL (ref 3.77–5.28)
RDW: 14.4 % (ref 11.7–15.4)
WBC: 6 x10E3/uL (ref 3.4–10.8)

## 2024-04-16 LAB — COMPREHENSIVE METABOLIC PANEL WITH GFR
ALT: 18 IU/L (ref 0–32)
AST: 27 IU/L (ref 0–40)
Albumin: 4.2 g/dL (ref 3.9–4.9)
Alkaline Phosphatase: 101 IU/L (ref 49–135)
BUN/Creatinine Ratio: 16 (ref 12–28)
BUN: 15 mg/dL (ref 8–27)
Bilirubin Total: 0.3 mg/dL (ref 0.0–1.2)
CO2: 22 mmol/L (ref 20–29)
Calcium: 9.7 mg/dL (ref 8.7–10.3)
Chloride: 101 mmol/L (ref 96–106)
Creatinine, Ser: 0.96 mg/dL (ref 0.57–1.00)
Globulin, Total: 2.4 g/dL (ref 1.5–4.5)
Glucose: 205 mg/dL — ABNORMAL HIGH (ref 70–99)
Potassium: 4.9 mmol/L (ref 3.5–5.2)
Sodium: 140 mmol/L (ref 134–144)
Total Protein: 6.6 g/dL (ref 6.0–8.5)
eGFR: 66 mL/min/1.73

## 2024-04-16 LAB — PROTIME-INR
INR: 0.9 (ref 0.9–1.2)
Prothrombin Time: 10.1 s (ref 9.1–12.0)

## 2024-04-16 LAB — AFP TUMOR MARKER: AFP, Serum, Tumor Marker: 2.3 ng/mL (ref 0.0–9.2)

## 2024-04-19 ENCOUNTER — Ambulatory Visit (HOSPITAL_COMMUNITY)
Admission: RE | Admit: 2024-04-19 | Discharge: 2024-04-19 | Disposition: A | Discharge status: Home / Self Care | Source: Ambulatory Visit | Attending: Gastroenterology | Admitting: Gastroenterology

## 2024-04-19 DIAGNOSIS — K7460 Unspecified cirrhosis of liver: Secondary | ICD-10-CM | POA: Diagnosis present

## 2024-04-19 DIAGNOSIS — K219 Gastro-esophageal reflux disease without esophagitis: Secondary | ICD-10-CM | POA: Diagnosis present

## 2024-04-19 DIAGNOSIS — K31819 Angiodysplasia of stomach and duodenum without bleeding: Secondary | ICD-10-CM | POA: Insufficient documentation

## 2024-04-22 ENCOUNTER — Telehealth: Payer: Self-pay

## 2024-04-22 ENCOUNTER — Other Ambulatory Visit: Payer: Self-pay

## 2024-04-22 DIAGNOSIS — J441 Chronic obstructive pulmonary disease with (acute) exacerbation: Secondary | ICD-10-CM

## 2024-04-22 NOTE — Telephone Encounter (Signed)
 Copied from CRM #[id number]. Topic: Clinical - Prescription Issue >> Apr 22, 2024 11:34 AM Travis F wrote: Reason for CRM: Patient is calling in because there is an issue with her prescription for Budesonide -Formoterol  Fumarate. Patient says she believes it's about the quantity. Patient says the pharmacy is going to send over a fax regarding the information.

## 2024-04-23 ENCOUNTER — Other Ambulatory Visit: Payer: Self-pay

## 2024-04-23 DIAGNOSIS — J441 Chronic obstructive pulmonary disease with (acute) exacerbation: Secondary | ICD-10-CM

## 2024-04-23 MED ORDER — BUDESONIDE-FORMOTEROL FUMARATE 160-4.5 MCG/ACT IN AERO: 2.0000 | INHALATION_SPRAY | Freq: Two times a day (BID) | RESPIRATORY_TRACT | 11 refills | Status: AC

## 2024-04-23 NOTE — Telephone Encounter (Signed)
 Prescription amount changed and resent to pharmacy

## 2024-04-25 ENCOUNTER — Ambulatory Visit: Payer: Self-pay | Admitting: Gastroenterology

## 2024-04-27 NOTE — Telephone Encounter (Signed)
 Patient advised

## 2024-05-01 ENCOUNTER — Other Ambulatory Visit: Payer: Self-pay

## 2024-05-05 ENCOUNTER — Other Ambulatory Visit: Payer: Self-pay

## 2024-05-05 DIAGNOSIS — E1165 Type 2 diabetes mellitus with hyperglycemia: Secondary | ICD-10-CM

## 2024-05-07 ENCOUNTER — Ambulatory Visit

## 2024-05-11 ENCOUNTER — Inpatient Hospital Stay: Attending: Oncology

## 2024-05-18 ENCOUNTER — Inpatient Hospital Stay: Admitting: Physician Assistant

## 2024-06-21 ENCOUNTER — Ambulatory Visit

## 2024-07-01 ENCOUNTER — Ambulatory Visit: Admitting: Nurse Practitioner

## 2024-07-12 ENCOUNTER — Ambulatory Visit: Admitting: Physician Assistant

## 2025-04-05 ENCOUNTER — Ambulatory Visit: Payer: Self-pay
